# Patient Record
Sex: Female | Born: 1940 | Race: White | Hispanic: No | Smoking: Never smoker
Health system: Southern US, Community
[De-identification: ages and names within clinical notes are randomized; demographics above are authoritative.]

## PROBLEM LIST (undated history)

## (undated) DIAGNOSIS — E7800 Pure hypercholesterolemia, unspecified: Secondary | ICD-10-CM

## (undated) DIAGNOSIS — K219 Gastro-esophageal reflux disease without esophagitis: Secondary | ICD-10-CM

## (undated) DIAGNOSIS — M1990 Unspecified osteoarthritis, unspecified site: Secondary | ICD-10-CM

## (undated) DIAGNOSIS — C801 Malignant (primary) neoplasm, unspecified: Secondary | ICD-10-CM

## (undated) DIAGNOSIS — R7303 Prediabetes: Secondary | ICD-10-CM

## (undated) DIAGNOSIS — I10 Essential (primary) hypertension: Secondary | ICD-10-CM

## (undated) DIAGNOSIS — Z923 Personal history of irradiation: Secondary | ICD-10-CM

## (undated) DIAGNOSIS — M24662 Ankylosis, left knee: Secondary | ICD-10-CM

## (undated) DIAGNOSIS — F329 Major depressive disorder, single episode, unspecified: Secondary | ICD-10-CM

## (undated) DIAGNOSIS — C50919 Malignant neoplasm of unspecified site of unspecified female breast: Secondary | ICD-10-CM

## (undated) DIAGNOSIS — N649 Disorder of breast, unspecified: Secondary | ICD-10-CM

## (undated) DIAGNOSIS — F32A Depression, unspecified: Secondary | ICD-10-CM

## (undated) DIAGNOSIS — E039 Hypothyroidism, unspecified: Secondary | ICD-10-CM

## (undated) HISTORY — PX: OTHER SURGICAL HISTORY: SHX169

## (undated) HISTORY — DX: Disorder of breast, unspecified: N64.9

## (undated) HISTORY — PX: EYE SURGERY: SHX253

## (undated) HISTORY — DX: Pure hypercholesterolemia, unspecified: E78.00

## (undated) HISTORY — PX: COLONOSCOPY: SHX174

## (undated) HISTORY — PX: TUBAL LIGATION: SHX77

## (undated) HISTORY — PX: TONSILLECTOMY: SUR1361

---

## 1971-01-02 HISTORY — PX: BACK SURGERY: SHX140

## 1999-02-10 ENCOUNTER — Encounter: Admission: RE | Admit: 1999-02-10 | Discharge: 1999-05-11 | Payer: Self-pay | Admitting: Internal Medicine

## 2001-02-18 ENCOUNTER — Encounter: Payer: Self-pay | Admitting: Obstetrics and Gynecology

## 2001-02-18 ENCOUNTER — Ambulatory Visit (HOSPITAL_COMMUNITY): Admission: RE | Admit: 2001-02-18 | Discharge: 2001-02-18 | Payer: Self-pay | Admitting: Obstetrics and Gynecology

## 2002-02-27 ENCOUNTER — Encounter: Payer: Self-pay | Admitting: Obstetrics and Gynecology

## 2002-02-27 ENCOUNTER — Ambulatory Visit (HOSPITAL_COMMUNITY): Admission: RE | Admit: 2002-02-27 | Discharge: 2002-02-27 | Payer: Self-pay | Admitting: Obstetrics and Gynecology

## 2002-05-14 ENCOUNTER — Other Ambulatory Visit: Admission: RE | Admit: 2002-05-14 | Discharge: 2002-05-14 | Payer: Self-pay | Admitting: Unknown Physician Specialty

## 2003-05-04 ENCOUNTER — Ambulatory Visit (HOSPITAL_COMMUNITY): Admission: RE | Admit: 2003-05-04 | Discharge: 2003-05-04 | Payer: Self-pay | Admitting: Internal Medicine

## 2003-05-10 ENCOUNTER — Encounter: Admission: RE | Admit: 2003-05-10 | Discharge: 2003-05-10 | Payer: Self-pay | Admitting: Obstetrics and Gynecology

## 2003-06-16 ENCOUNTER — Encounter: Admission: RE | Admit: 2003-06-16 | Discharge: 2003-06-16 | Payer: Self-pay | Admitting: Orthopaedic Surgery

## 2003-07-09 ENCOUNTER — Emergency Department (HOSPITAL_COMMUNITY): Admission: EM | Admit: 2003-07-09 | Discharge: 2003-07-09 | Payer: Self-pay | Admitting: Emergency Medicine

## 2003-07-13 ENCOUNTER — Encounter (HOSPITAL_COMMUNITY): Admission: RE | Admit: 2003-07-13 | Discharge: 2003-08-12 | Payer: Self-pay | Admitting: Orthopaedic Surgery

## 2003-08-17 ENCOUNTER — Encounter (HOSPITAL_COMMUNITY): Admission: RE | Admit: 2003-08-17 | Discharge: 2003-09-16 | Payer: Self-pay | Admitting: Orthopaedic Surgery

## 2004-05-11 ENCOUNTER — Encounter: Admission: RE | Admit: 2004-05-11 | Discharge: 2004-05-11 | Payer: Self-pay | Admitting: Obstetrics and Gynecology

## 2005-06-15 ENCOUNTER — Ambulatory Visit (HOSPITAL_COMMUNITY): Admission: RE | Admit: 2005-06-15 | Discharge: 2005-06-15 | Payer: Self-pay | Admitting: Internal Medicine

## 2006-06-18 ENCOUNTER — Ambulatory Visit (HOSPITAL_COMMUNITY): Admission: RE | Admit: 2006-06-18 | Discharge: 2006-06-18 | Payer: Self-pay | Admitting: Obstetrics & Gynecology

## 2007-06-04 ENCOUNTER — Ambulatory Visit (HOSPITAL_COMMUNITY): Admission: RE | Admit: 2007-06-04 | Discharge: 2007-06-04 | Payer: Self-pay | Admitting: Internal Medicine

## 2007-06-20 ENCOUNTER — Ambulatory Visit (HOSPITAL_COMMUNITY): Admission: RE | Admit: 2007-06-20 | Discharge: 2007-06-20 | Payer: Self-pay | Admitting: Obstetrics & Gynecology

## 2007-07-10 ENCOUNTER — Ambulatory Visit (HOSPITAL_COMMUNITY): Admission: RE | Admit: 2007-07-10 | Discharge: 2007-07-10 | Payer: Self-pay | Admitting: Internal Medicine

## 2007-08-07 ENCOUNTER — Encounter: Admission: RE | Admit: 2007-08-07 | Discharge: 2007-08-07 | Payer: Self-pay | Admitting: Surgery

## 2008-06-21 ENCOUNTER — Ambulatory Visit (HOSPITAL_COMMUNITY): Admission: RE | Admit: 2008-06-21 | Discharge: 2008-06-21 | Payer: Self-pay | Admitting: Internal Medicine

## 2008-11-30 ENCOUNTER — Other Ambulatory Visit: Admission: RE | Admit: 2008-11-30 | Discharge: 2008-11-30 | Payer: Self-pay | Admitting: Obstetrics & Gynecology

## 2009-02-16 ENCOUNTER — Ambulatory Visit: Payer: Self-pay | Admitting: Orthopedic Surgery

## 2009-02-16 ENCOUNTER — Encounter (INDEPENDENT_AMBULATORY_CARE_PROVIDER_SITE_OTHER): Payer: Self-pay | Admitting: *Deleted

## 2009-02-16 DIAGNOSIS — M24519 Contracture, unspecified shoulder: Secondary | ICD-10-CM | POA: Insufficient documentation

## 2009-02-16 DIAGNOSIS — M25519 Pain in unspecified shoulder: Secondary | ICD-10-CM | POA: Insufficient documentation

## 2009-02-22 ENCOUNTER — Ambulatory Visit (HOSPITAL_COMMUNITY): Admission: RE | Admit: 2009-02-22 | Discharge: 2009-02-22 | Payer: Self-pay | Admitting: Orthopedic Surgery

## 2009-02-28 ENCOUNTER — Ambulatory Visit: Payer: Self-pay | Admitting: Orthopedic Surgery

## 2009-02-28 DIAGNOSIS — M19019 Primary osteoarthritis, unspecified shoulder: Secondary | ICD-10-CM | POA: Insufficient documentation

## 2009-03-01 ENCOUNTER — Ambulatory Visit: Payer: Self-pay | Admitting: Orthopedic Surgery

## 2009-03-01 ENCOUNTER — Ambulatory Visit (HOSPITAL_COMMUNITY): Admission: RE | Admit: 2009-03-01 | Discharge: 2009-03-01 | Payer: Self-pay | Admitting: Orthopedic Surgery

## 2009-03-02 ENCOUNTER — Telehealth (INDEPENDENT_AMBULATORY_CARE_PROVIDER_SITE_OTHER): Payer: Self-pay | Admitting: *Deleted

## 2009-03-03 ENCOUNTER — Ambulatory Visit: Payer: Self-pay | Admitting: Orthopedic Surgery

## 2009-03-04 ENCOUNTER — Encounter: Payer: Self-pay | Admitting: Orthopedic Surgery

## 2009-03-04 ENCOUNTER — Encounter (HOSPITAL_COMMUNITY): Admission: RE | Admit: 2009-03-04 | Discharge: 2009-04-03 | Payer: Self-pay | Admitting: Orthopedic Surgery

## 2009-03-30 ENCOUNTER — Encounter: Payer: Self-pay | Admitting: Orthopedic Surgery

## 2009-03-31 ENCOUNTER — Ambulatory Visit: Payer: Self-pay | Admitting: Orthopedic Surgery

## 2009-04-04 ENCOUNTER — Encounter (HOSPITAL_COMMUNITY): Admission: RE | Admit: 2009-04-04 | Discharge: 2009-05-04 | Payer: Self-pay | Admitting: Orthopedic Surgery

## 2009-04-27 ENCOUNTER — Encounter: Payer: Self-pay | Admitting: Orthopedic Surgery

## 2009-05-05 ENCOUNTER — Encounter (HOSPITAL_COMMUNITY): Admission: RE | Admit: 2009-05-05 | Discharge: 2009-06-04 | Payer: Self-pay | Admitting: Orthopedic Surgery

## 2009-05-25 ENCOUNTER — Encounter: Payer: Self-pay | Admitting: Orthopedic Surgery

## 2009-05-26 ENCOUNTER — Ambulatory Visit: Payer: Self-pay | Admitting: Orthopedic Surgery

## 2009-06-06 ENCOUNTER — Encounter (HOSPITAL_COMMUNITY): Admission: RE | Admit: 2009-06-06 | Discharge: 2009-07-06 | Payer: Self-pay | Admitting: Orthopedic Surgery

## 2009-06-23 ENCOUNTER — Ambulatory Visit (HOSPITAL_COMMUNITY): Admission: RE | Admit: 2009-06-23 | Discharge: 2009-06-23 | Payer: Self-pay | Admitting: Internal Medicine

## 2009-06-24 ENCOUNTER — Encounter: Payer: Self-pay | Admitting: Orthopedic Surgery

## 2009-07-06 ENCOUNTER — Ambulatory Visit: Payer: Self-pay | Admitting: Orthopedic Surgery

## 2010-01-25 ENCOUNTER — Ambulatory Visit
Admission: RE | Admit: 2010-01-25 | Discharge: 2010-01-25 | Payer: Self-pay | Source: Home / Self Care | Attending: Surgery | Admitting: Surgery

## 2010-01-25 LAB — POCT HEMOGLOBIN-HEMACUE: Hemoglobin: 15.3 g/dL — ABNORMAL HIGH (ref 12.0–15.0)

## 2010-01-27 NOTE — Op Note (Signed)
  NAME:  Breanna Hill, Breanna Hill              ACCOUNT NO.:  [id number]  MEDICAL RECORD NO.:  [id number]          PATIENT TYPE:  AMB  LOCATION:  DSC                          FACILITY:  [facility]  PHYSICIAN:  Thornton Park. Daphine Deutscher, MD  DATE OF BIRTH:  08/11/1940  DATE OF PROCEDURE:  01/25/2010 DATE OF DISCHARGE:                              OPERATIVE REPORT   PREOPERATIVE DIAGNOSIS:  Bleeding hemorrhoids.  POSTOPERATIVE DIAGNOSIS:  Bleeding hemorrhoids with some degree of prolapse.  PROCEDURE:  Exam under anesthesia, hemorrhoidal banding x2.  SURGEON:  Thornton Park. Daphine Deutscher, MD  ASSISTANT:  None.  ANESTHESIA:  General in the supine position.  FINDINGS: 1. At the 12 o'clock position, there was a large hemorrhoidal cluster     internally that had friable areas, it looked like it could be the     main source of bleeding.  These were double banded.  I felt     carefully at that position for possible rectocele, but there was     some weakness and attenuation, but not a frank rectocele.  There     was a degree of prolapse noted circumferentially and puckering, but     no significant external hemorrhoidal prolapse. 2. She had a hemorrhoid at the 5 o'clock position that was double     banded.  DESCRIPTION OF PROCEDURE:  This 70 year old lady was taken to room 3 [facility] on January 25, 2010.  She was placed in dorsal lithotomy position and her bottom was prepped with PCMX and draped sterilely.  I went ahead and did an exam under anesthesia and found at the 12 o'clock the above-mentioned area.  This was double banded.  I looked around the rest of the anorectal region, found the other area at about 5 o'clock, which was double banded.  No other significant internal columns could I found that I can band.  She had some little external areas and middle mild prolapse, there was more of puckering of her anorectal canal.  The area inside was prepped with Betadine as I completed this.  The patient  instructions were given to the husband and I gave her Percocet 5/325 #30 to take for pain and we will follow up in the office in about 3 weeks.     Thornton Park Daphine Deutscher, MD     MBM/MEDQ  D:  01/25/2010  T:  01/26/2010  Job:  [id number]  cc:   Kingsley Callander. Ouida Sills, MD  Electronically Signed by Luretha Murphy MD on 01/27/2010 08:30:50 PM

## 2010-01-31 NOTE — Assessment & Plan Note (Signed)
Summary: 8 WK RE-CK/POST OP RT SHOULDER/SURG 03/01/09/MEDICARE,BCBS/CAF   Visit Type:  Follow-up Referring Provider:  Dr. Ouida Sills Primary Provider:  Dr. Ouida Sills  CC:  recheck shoulder post op.  History of Present Illness:   DOS 03-01-09. Arthroscopy right shoulder, capsular release plus a manipulation under anesthesia, distal clavicle excision and subacromial decompression.  Medications: Robaxin, Norco sometimes not often / ibuprofen as needed   Today is recheck after PT.  Today is 8 week recheck after PT.  Progress note from 05/25/09 for review for PT and to be signed.  Still some stiffness and pain.  she does not want to take her pain medication she only takes ibuprofen  I've encouraged her to take her pain medication  I reviewed her physical therapy notes she's just about on schedule I see no problems with the therapy she is improving her range of motion.  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    Allergies: 1)  ! Pcn   Shoulder/Elbow Exam  General:    Well-developed, well-nourished, normal body habitus; no deformities, normal grooming.    Skin:    Intact, no scars, lesions, rashes, cafe au lait spots or bruising.    Inspection:    Inspection is normal.     her range of motion is active forward elevation 110, and therapy she gets about 125, the LEFT side is about 130.  External rotation is about 40 on the RIGHT and abduction is about 100.  She can put her hand on her head without moving her head.  So her functional range of motion is good at this point.   Impression & Recommendations:  Problem # 1:  AFTERCARE FOLLOW SURGERY MUSCULOSKEL SYSTEM NEC (ICD-V58.78) Assessment Improved  she is impatient and wants things to be better right now I tried to counsel her that she is on schedule, but this is typical for the problem that she had with the adhesive capsulitis.  I've encouraged her to use her pain medication so she can  tolerate the therapy  Orders: Post-Op Check ([address])  Patient Instructions: 1)  return in 6 weeks

## 2010-01-31 NOTE — Miscellaneous (Signed)
Summary: OT Clinical evaluation  OT Clinical evaluation   Imported By: Jacklynn Ganong 03/15/2009 11:47:22  _____________________________________________________________________  External Attachment:    Type:   Image     Comment:   External Document

## 2010-01-31 NOTE — Miscellaneous (Signed)
Summary: OT progress note  OT progress note   Imported By: Jacklynn Ganong 06/30/2009 09:34:39  _____________________________________________________________________  External Attachment:    Type:   Image     Comment:   External Document

## 2010-01-31 NOTE — Miscellaneous (Signed)
Summary: mri aph shoulder 02/22/09  Clinical Lists Changes  no precert needed for medicare but BCBS number is [id number] expires 30 days drom 02/16/09, aph mri rt shoulder on 02/22/09 reg at 1030am, apt to fu here in our office on 02/28/09 at 930am to bring disc.

## 2010-01-31 NOTE — Assessment & Plan Note (Signed)
Summary: 4 WK RE-CK/POST OP RT SHOULDER/SURG 03/01/09/MEDICARE,BC/CAF   Visit Type:  Follow-up Referring Provider:  Dr. Ouida Sills Primary Provider:  Dr. Ouida Sills  CC:  post op shoulder.  History of Present Illness:   DOS 03-01-09. Arthroscopy right shoulder, capsular release plus a manipulation under anesthesia, distal clavicle excision and subacromial decompression.  Medications: Robaxin, Norco 7.5.  POD 30  [week # 4]  Today is recheck after PT.  No problems.  I do not have a therapy report. I measured her to 90 of forward elevation and 35 of external rotation.  Recommend that she take her pain medicine, which he is reluctant to do and go ahead and progress further and therapy come back in 8 week's    Allergies: 1)  ! Pcn   Other Orders: Post-Op Check (04540)  Patient Instructions: 1)  Return in 8 weeks  2)  Continue Physical Therapy

## 2010-01-31 NOTE — Letter (Signed)
Summary: History form  History form   Imported By: Jacklynn Ganong 02/17/2009 07:59:43  _____________________________________________________________________  External Attachment:    Type:   Image     Comment:   External Document

## 2010-01-31 NOTE — Miscellaneous (Signed)
Summary: OT Progress note  OT Progress note   Imported By: Jacklynn Ganong 05/26/2009 14:06:19  _____________________________________________________________________  External Attachment:    Type:   Image     Comment:   External Document

## 2010-01-31 NOTE — Miscellaneous (Signed)
Summary: OT progress note  OT progress note   Imported By: Jacklynn Ganong 05/03/2009 11:59:14  _____________________________________________________________________  External Attachment:    Type:   Image     Comment:   External Document

## 2010-01-31 NOTE — Miscellaneous (Signed)
Summary: OT progress note  OT progress note   Imported By: Jacklynn Ganong 04/01/2009 08:08:54  _____________________________________________________________________  External Attachment:    Type:   Image     Comment:   External Document

## 2010-01-31 NOTE — Assessment & Plan Note (Signed)
Summary: RT SHOULDER PAIN NEEDS XR/ST BCBS/BSF   Vital Signs:  Patient profile:   70 year old female Weight:      162 pounds Pulse rate:   70 / minute Resp:     16 per minute  Vitals Entered By: Fuller Canada MD (February 16, 2009 8:36 AM)  Visit Type:  Initial Consult Referring Provider:  Dr. Ouida Sills Primary Provider:  Dr. Ouida Sills  CC:  right shoulder pain.  History of Present Illness: I saw Breanna Hill in the office today for an initial visit.  She is a 70 years old woman with the complaint of:  Right shoulder pain.  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  She is not sleeping well.  Her symptoms are intermittent and exacerbated by attempts at motion she feels a catching sensation.  She had 2 cortisone injections over the past 2 months and they helped for a short time.  She now comes in for consultation referenced RIGHT shoulder stiffness and pain  Xrays to be taken in our office today.  Meds: Gemfibrozil, Lipitor, Synthroid, Oxybutynin, Aspirin.     Allergies (verified): 1)  ! 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 Cardiac :  Denies chest pain, angina, heart attack, heart failure, poor circulation, blood clots, and phlebitis. Resp:  Denies short of breath, difficulty breathing, COPD, cough, and pneumonia. GI:  Denies nausea,  vomiting, diarrhea, constipation, difficulty swallowing, ulcers, GERD, and reflux; blood in stool. GU:  Denies kidney failure, kidney transplant, kidney stones, burning, poor stream, testicular cancer, blood in urine, and . Neuro:  Denies headache, dizziness, migraines, numbness, weakness, tremor, and unsteady walking. MS:  Complains of joint pain; denies rheumatoid arthritis, joint swelling, gout, bone cancer, osteoporosis, and ; muscle pain. Endo:  Denies thyroid disease, goiter, and diabetes. Psych:  Denies depression, mood swings, anxiety, panic attack, bipolar, and schizophrenia. Derm:  Denies eczema, cancer, and itching. EENT:  Denies poor vision, cataracts, glaucoma, poor hearing, vertigo, ears ringing, sinusitis, hoarseness, toothaches, and bleeding gums. Immunology:  Denies seasonal allergies, sinus problems, and allergic to bee stings. Lymphatic:  Denies lymph node cancer and lymph edema.  Physical Exam  Msk:  normal development grooming and hygiene  Vital signs as recorded stable. Pulses:  cardiovascular exam shows normal peripheral vascular system by observation and palpation of the upper extremities Extremities:  her neck is mildly tender range of motion has been preserved there is some tightness in the RIGHT side and some tenderness in the RIGHT supraspinatus fossa  The LEFT shoulder has full range of motion, normal strength.  No joint subluxation or laxity.  No tenderness. Neurologic:  reflexes are 2+ and equal in both upper extremities sensation was intact and normal.  Coordination was normal as well. Skin:  cervical spine, upper trunk in both upper extremities had normal skin Cervical Nodes:  no significant adenopathy Axillary Nodes:  no  significant adenopathy Psych:  alert and cooperative; normal mood and affect; normal attention span and concentration   Shoulder/Elbow Exam  Shoulder Exam:    Right:    Inspection:  Abnormal    Palpation:  Abnormal    Stability:   stable    Tenderness:  supraspinatus fossa    Swelling:  none    Erythema:  negative    active range of motion forward elevation = 100 passively the same  Abduction equals 75 passive the same  External rotation equals 40 passive the same    Left:    Inspection:  Normal    Palpation:  Normal    Stability:  stable  Forearm Exam:    Right:    Inspection:  Normal    Palpation:  Normal    Stability:  stable    Tenderness:  no    Swelling:  no    Erythema:  no    Left:    Inspection:  Normal    Palpation:  Normal    Stability:  stable    Tenderness:  no    Swelling:  no    Erythema:  no  Elbow Exam:    Right:    Inspection:  Normal    Palpation:  Normal    Stability:  stable    Tenderness:  no    Swelling:  no    Erythema:  no    Range of Motion:       Flexion-Active: 135       Extension-Active: 0       Flexion-Passive: 135       Extension-Passive: 0       Elbow Flexion:      Left:    Inspection:  Normal    Palpation:  Normal    Stability:  stable    Tenderness:  no    Swelling:  no    Erythema:  no    Range of Motion:       Flexion-Active: 135       Extension-Active: 0       Flexion-Passive: 135       Extension-Passive: 0       Elbow Flexion:    Impingement Sign NEER:    Left negative Impingement Sign HAWKINS:    Left negative Apprehension Sign:    Left negative   Impression & Recommendations:  Problem # 1:  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  Normal contour and shins line of the shoulder.  Type II acromion.  No bony abnormality.  Impression, normal RIGHT shoulder  Patient Instructions: 1)  MRI right shoulder come back with results

## 2010-01-31 NOTE — Assessment & Plan Note (Signed)
Summary: POST OP 1/RT SHOULDER/MEDICARE,BCBS/CAF   Visit Type:  post op #1 Referring Provider:  Dr. Ouida Sills Primary Provider:  Dr. Ouida Sills  CC:  post op right shoulder.  History of Present Illness: I saw Breanna Hill in the office today for a followup visit.  She is a 70 years old woman with the complaint of:  post op day 2   DOS 03-01-09. Arthroscopy right shoulder, capsular release plus a manipulation under anesthesia, distal clavicle excision and subacromial decompression.  Medications: Robaxin, Norco 7.5.  Allergies: 1)  ! Pcn   Shoulder/Elbow Exam  General:    Well-developed, well-nourished, normal body habitus; no deformities, normal grooming.    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

## 2010-01-31 NOTE — Assessment & Plan Note (Signed)
Summary: 6 WK RE-CK/POST OP RT SHOULDER/SURG 03/01/09/MEDICARE,BCBS/CAF   Visit Type:  post op Referring Provider:  Dr. Ouida Sills Primary Provider:  Dr. Ouida Sills  CC:  right shoulder .  History of Present Illness: I saw Breanna Hill in the office today for a 6 week  followup visit.  She is a 70 years old woman with the complaint of:  right shoulder  DOS 03-01-09. Arthroscopy right shoulder, capsular release plus a manipulation under anesthesia, distal clavicle excision and subacromial decompression.  Medications: none.  Today 4 month recheck:   She says the only thing she can do his hook her bra.  She can do all activities of daily living and she's not having any pain.  She has equal flexion of the RIGHT shoulder compared to the LEFT except for the last 10 she has excellent external rotation her internal rotation is to her hip pocket  I think she should be fine with a home exercise program follow up as needed  Allergies: 1)  ! Pcn   Impression & Recommendations:  Problem # 1:  AFTERCARE FOLLOW SURGERY MUSCULOSKEL SYSTEM NEC (ICD-V58.78) Assessment Improved  Orders: Est. 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 Patient Level II (60454)  Patient Instructions: 1)  Home exercises  2)  f/u as needed

## 2010-01-31 NOTE — Progress Notes (Signed)
Summary: cryocuff is causing discomfort  Phone Note Call from Patient Call back at Home Phone [phone]   Summary of Call: said that the shoulder cryocuff if very uncomfortable for her breast, I advised she could take it off to get comfortable or apply cushioning if needed, advised not to move arm, shoulder post op to keep her arm as close to her body as possible. Initial call taken by: Ether Griffins,  March 02, 2009 9:10 AM

## 2010-01-31 NOTE — Assessment & Plan Note (Signed)
Summary: mri results shoulder aph to bring disc/[email or web address]   Visit Type:  Follow-up Referring Provider:  Dr. Ouida Sills Primary Provider:  Dr. Ouida Sills  CC:  right shoulder pain.  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  She is not sleeping well.  Her symptoms are intermittent and exacerbated by attempts at motion she feels a catching sensation.  She had 2 cortisone injections over the past 2 months and they helped for a short time. she has not made significant improvements so we went for an MRI.   MRI results.  IMPRESSION:   1.  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 2.  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 3.  Downsloping, type 1 acromion.  Subacromial/subdeltoid bursitis noted.   Read By:  Charyl Dancer,  M.D.      Meds: Gemfibrozil, Lipitor, Synthroid, Oxybutynin, Aspirin.     Allergies: 1)  ! Pcn  Physical Exam  Additional Exam:  Msk:  normal development grooming and hygiene  Vital signs as recorded stable. Pulses:  cardiovascular exam shows normal peripheral vascular system by observation and palpation of the upper extremities Extremities:  her neck is mildly tender range of motion has been preserved there is some tightness in the RIGHT side and some tenderness in the RIGHT supraspinatus fossa  The LEFT shoulder has full range of motion, normal strength.  No joint subluxation or laxity.  No tenderness. Neurologic:  reflexes are 2+ and equal in both upper extremities sensation was intact and normal.  Coordination was normal as well. Skin:  cervical spine, upper trunk in both upper extremities had normal  skin Cervical Nodes:  no significant adenopathy Axillary Nodes:  no significant adenopathy Psych:  alert and cooperative; normal mood and affect; normal attention span and concentration   Shoulder/Elbow Exam  Shoulder Exam:    Right:    Inspection:  Abnormal    Palpation:  Abnormal    Stability:  stable    Tenderness:  supraspinatus fossa    Swelling:  none    Erythema:  negative    active range of motion forward elevation = 100 passively the same  Abduction equals 75 passive the same  External rotation equals 40 passive the same    Left:    Inspection:  Normal    Palpation:  Normal    Stability:  stable      Impression & Recommendations:  Problem # 1:  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 I have answered their questions. The risks of bleeding, infection, nerve and vascualr injury have been discussed. The diagnosis and reason for surgery have been explained. The patient demonstrates understanding of this discussion. Specific to this procedure risks include:  pain, stiffness weakness   Orders: Est. 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 Patient Level IV (60454)  Problem # 3:  SHOULDER PAIN (UJW-119.14)  Patient Instructions: 1)  DOS 03/01/09 at [facility] 2)  Preop at [facility] today, I will call and let you know what time to go, 02/28/09  at 130pm 3)  Post op 1 in our office on Thursday 03/03/09

## 2010-01-31 NOTE — Letter (Signed)
Summary: surgery order RTshoulder sched 03/01/09  surgery order RTshoulder sched 03/01/09   Imported By: Cammie Sickle 04/05/2009 11:28:01  _____________________________________________________________________  External Attachment:    Type:   Image     Comment:   External Document

## 2010-01-31 NOTE — Letter (Signed)
Summary: *Orthopedic Consult Note  [facility]  [address] [place] [address]  [place], [place] [address]   Phone: [phone]  Fax: [phone]    Re:    AILINE HEFFERAN DOB:    [birth year]   Dear: Dr. Ouida Sills   Thank you for requesting that we see the above patient for consultation.  A copy of the detailed office note will be sent under separate cover, for your review.  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       Thank you for this opportunity to look after your patient.  Sincerely,   Terrance Mass. MD.

## 2010-03-22 LAB — BASIC METABOLIC PANEL
BUN: 18 mg/dL (ref 6–23)
CO2: 29 mEq/L (ref 19–32)
Calcium: 9 mg/dL (ref 8.4–10.5)
GFR calc Af Amer: >60 mL/min (ref >60)
Glucose, Bld: 122 mg/dL — ABNORMAL HIGH (ref 70–99)
Potassium: 3.6 mEq/L (ref 3.5–5.1)
Sodium: 138 mEq/L (ref 135–145)

## 2010-03-22 LAB — HEMOGLOBIN AND HEMATOCRIT, BLOOD: Hemoglobin: 13.6 g/dL (ref 12.0–15.0)

## 2010-05-19 NOTE — Consult Note (Signed)
NAME:  Breanna Hill, Breanna Hill                          ACCOUNT NO.:  [id number]   MEDICAL RECORD NO.:  [id number]                  PATIENT TYPE:   LOCATION:                                       FACILITY:   PHYSICIAN:  Lionel December, M.D.                 DATE OF BIRTH:  05-21-1940   DATE OF CONSULTATION:  DATE OF DISCHARGE:                                   CONSULTATION   REFERRING PHYSICIAN:  Dr. Ouida Sills.   REASON FOR CONSULTATION:  Intermittent hematochezia, requesting screening  colonoscopy.   HISTORY OF PRESENT ILLNESS:  Breanna Hill is a 70 year old healthy Caucasian  female who presents to our office with a long standing history of chronic  intermittent small volume fresh rectal bleeding noted on toilet paper in the  commode. She does have history of some fissures as well as multiple  hemorrhoids. She can remember history of having bowel trouble even a  child. She reports that her rectum was dilated at approximately 70 years of  age. She also reports flexible sigmoidoscopy approximately 10 years ago  which was normal. She has never had screening colonoscopy. She does use  occasional over-the-counter hemorrhoidal remedies with good but intermittent  relief. She complains of intermittent proctalgia. She denies any  constipation or hard stools. She does report occasional loose stool with  bowel movements between one and three times a day. She also reports slight  fecal incontinence as well as urgency and tenesmus. She denies any abdominal  pain or melena. She does report occasional abdominal bloating. Family  history positive for multiple cancers on the maternal side. There are 12  siblings, and 10 out of 12 have multiple different types of cancer. She  believes maternal grandfather had history of colon cancer diagnosed in his  late 38s and a maternal aunt with some type of questionable colon cancer  versus gastric carcinoma.   PAST MEDICAL HISTORY:  1. Hyperlipidemia.  2.  Arthritis.  3. Hypothyroidism.   PAST SURGICAL HISTORY:  1. Disk surgery on her back multiple years ago.  2. Right foot surgery for bunionectomy.  3. Rectal dilatation as a child, details unknown.   CURRENT MEDICATIONS:  1. Gemfibrozil 600 mg one tablet b.i.d.  2. Lipitor 20 mg daily.  3. Synthroid 100 mcg daily.  4. Aspirin 81 mg daily.  5. Tylenol arthritis p.r.n.   ALLERGIES:  PENICILLIN.   FAMILY HISTORY:  As stated in the HPI, there are multiple maternal cancers  of different etiologies, ten out of 12 siblings, with questionable colon  carcinoma in grandfather diagnosed in his 1s. Questionable maternal aunt  with colon carcinoma versus gastric. Mother deceased at age 79 secondary to  coronary artery disease as well as father in his 59s. She has one brother  with hyperlipidemia.   SOCIAL HISTORY:  Breanna Hill has been married for 42 years. She had two  grown healthy children; one son is  in remission from leukemia status post  bone marrow transplantation at age 75. She is currently retired since  September 2004. Tobacco:  Denies any use. Alcohol:  Reports occasional  social drink up to four times a week. Drugs:  Denies any use.   REVIEW OF SYSTEMS:  CONSTITUTIONAL:  Weight is steadily __________ although  she attributes this as due to _____________ physical activity. She denies  any fevers, chills. Appetite is good. Denies any fatigue. CARDIOVASCULAR:  Denies any chest pain or palpitations. PULMONARY:  Denies any shortness of  breath, dyspnea, cough, or hemoptysis. GASTROINTESTINAL:  See HPI. Denies  any history of chronic GERD, peptic ulcer disease, dysphagia, or  odynophagia.   PHYSICAL EXAMINATION:  VITAL SIGNS:  Weight 174 pounds, height 63.5 inches,  temperature 98.1, blood pressure 120/80, pulse 76.  GENERAL:  Breanna Hill is a 70 year old well-developed, well-nourished  Caucasian female in no acute distress. She appears younger than her stated  age.  HEENT:   Sclerae clear, nonicteric. Conjunctivae are pink. Oropharynx pink  and moist without any lesions.  NECK:  Supple without any thyromegaly.  CHEST:  Heart regular rate and rhythm with normal S1 and S2 without any  murmurs, rubs, or gallops. Lungs clear to auscultation bilaterally.  ABDOMEN:  Protuberant with positive bowel sounds x4. No audible bruits.  Soft, nontender, nondistended with palpable mass or hepatosplenomegaly. No  rebound tenderness or guarding.  RECTAL:  Deferred.  EXTREMITIES:  Good pulses bilaterally. Trace edema bilaterally.  SKIN:  Warm and dry without any rashes or jaundice.   ASSESSMENT:  Breanna Hill is a 70 year old Caucasian female with  intermittent small volume hematochezia for long standing period of time. She  does not display any red flags as far as colorectal carcinoma including  weight loss, malnourishment, or any other constitutional symptoms. She does  note a long standing history of problems with possible tenesmus, urgency,  and possible fecal incontinence as well. Further evaluation is necessary at  this point in time.   RECOMMENDATIONS:  1. Will schedule colonoscopy with Dr. Karilyn Cota in the near future. I have     discussed the procedure including risks and benefits which include but     are not limited to bleeding, perforation, infection, drug reaction. She     agrees with this plan and consult will be obtained. I asked her to hold     her aspirin three days prior to the procedure.  2. Standard hemorrhoid literature and precautions.  3. Further recommendations pending procedure.   We would like to thank Dr. Ouida Sills for allowing [place] to participate in the care  of Breanna Hill.     ________________________________________  ___________________________________________  Nicholas Lose, N.P.                  Lionel December, M.D.   KC/MEDQ  D:  04/27/2003  T:  04/27/2003  Job:  [id number]   cc:   Kingsley Callander. Ouida Sills, M.D. [address]  [place]   [place] [address]  Fax: [phone]

## 2010-05-19 NOTE — Op Note (Signed)
NAME:  Breanna Hill, Breanna Hill                        ACCOUNT NO.:  [id number]   MEDICAL RECORD NO.:  [id number]                   PATIENT TYPE:  AMB   LOCATION:  DAY                                  FACILITY:  [facility]   PHYSICIAN:  Lionel December, M.D.                 DATE OF BIRTH:  1940-12-12   DATE OF PROCEDURE:  05/04/2003  DATE OF DISCHARGE:                                 OPERATIVE REPORT   PROCEDURE:  Total colonoscopy.   INDICATIONS:  Breanna Hill is a 70 year old Caucasian female who was here  for screening colonoscopy. She has intermittent hematochezia, possibly  related to hemorrhoids. Family history is negative for colorectal carcinoma.  Procedure was reviewed with the patient and informed consent was obtained.   PREOPERATIVE MEDICATIONS:  Demerol 25 mg IV, Versed 4 mg IV.   FINDINGS:  Procedure performed in endoscopy suite. The patient's vital signs  and O2 saturations were monitored during procedure and remained stable. The  patient was placed in the left lateral position and rectal examination  performed. No abnormality. She has soft sentinel skin tags. Digital exam was  normal. Scope was placed in the rectum and advanced into sigmoid colon and  beyond. Preparation was excellent. Scope was passed to the cecum which was  identified by appendiceal orifice. Picture taken for the record. As the  scope was withdrawn, colonic mucosa was once again carefully examined. There  were two small polyps at ascending colon. One was about 5 mm, other one a  little smaller. These appeared to be edematous, and these were snared free  for histological examination. Pictures taken for the record. As the scope  was withdrawn, the rest of the colonic mucosa was carefully examined and was  normal. Perirectal mucosa similarly was normal. The scope was retroflexed,  examined the inner rectum, and small hemorrhoids were noted below the  dentate line.  The patient tolerated the procedure well.   FINAL  DIAGNOSES:  1. Two small polyps snared from ascending colon.  2. Small external hemorrhoids felt to be source of patient's intermittent     hematochezia.   RECOMMENDATIONS:  Standard instructions given. I will be contacting patient  with biopsy results for further recommendations.      ___________________________________________                                            Lionel December, M.D.   NR/MEDQ  D:  05/04/2003  T:  05/04/2003  Job:  [id number]   cc:   Kingsley Callander. Ouida Sills, M.D.  [address]  [place]  [place] [address]  Fax: [phone]

## 2010-05-24 ENCOUNTER — Other Ambulatory Visit (HOSPITAL_COMMUNITY): Payer: Self-pay | Admitting: Internal Medicine

## 2010-05-24 DIAGNOSIS — Z139 Encounter for screening, unspecified: Secondary | ICD-10-CM

## 2010-06-26 ENCOUNTER — Ambulatory Visit (HOSPITAL_COMMUNITY)
Admission: RE | Admit: 2010-06-26 | Discharge: 2010-06-26 | Disposition: A | Discharge status: Home / Self Care | Payer: Medicare Other | Source: Ambulatory Visit | Attending: Internal Medicine | Admitting: Internal Medicine

## 2010-06-26 DIAGNOSIS — Z1231 Encounter for screening mammogram for malignant neoplasm of breast: Secondary | ICD-10-CM | POA: Insufficient documentation

## 2010-06-26 DIAGNOSIS — Z139 Encounter for screening, unspecified: Secondary | ICD-10-CM

## 2011-06-28 ENCOUNTER — Other Ambulatory Visit: Payer: Self-pay | Admitting: Internal Medicine

## 2011-06-28 DIAGNOSIS — Z1231 Encounter for screening mammogram for malignant neoplasm of breast: Secondary | ICD-10-CM

## 2011-07-25 ENCOUNTER — Ambulatory Visit
Admission: RE | Admit: 2011-07-25 | Discharge: 2011-07-25 | Disposition: A | Discharge status: Home / Self Care | Payer: Medicare Other | Source: Ambulatory Visit | Attending: Internal Medicine | Admitting: Internal Medicine

## 2011-07-25 DIAGNOSIS — Z1231 Encounter for screening mammogram for malignant neoplasm of breast: Secondary | ICD-10-CM

## 2011-10-02 ENCOUNTER — Ambulatory Visit (INDEPENDENT_AMBULATORY_CARE_PROVIDER_SITE_OTHER): Payer: Medicare Other | Admitting: Urology

## 2011-10-02 DIAGNOSIS — R351 Nocturia: Secondary | ICD-10-CM

## 2011-10-02 DIAGNOSIS — R3915 Urgency of urination: Secondary | ICD-10-CM

## 2011-10-02 DIAGNOSIS — N3946 Mixed incontinence: Secondary | ICD-10-CM

## 2011-10-02 DIAGNOSIS — R32 Unspecified urinary incontinence: Secondary | ICD-10-CM

## 2011-12-04 ENCOUNTER — Ambulatory Visit (INDEPENDENT_AMBULATORY_CARE_PROVIDER_SITE_OTHER): Payer: Medicare Other | Admitting: Urology

## 2011-12-04 DIAGNOSIS — R3915 Urgency of urination: Secondary | ICD-10-CM

## 2011-12-04 DIAGNOSIS — N3946 Mixed incontinence: Secondary | ICD-10-CM

## 2012-06-16 ENCOUNTER — Other Ambulatory Visit: Payer: Self-pay | Admitting: *Deleted

## 2012-06-16 DIAGNOSIS — N3281 Overactive bladder: Secondary | ICD-10-CM

## 2012-06-16 MED ORDER — OXYBUTYNIN CHLORIDE ER 15 MG PO TB24: 15.0000 mg | ORAL_TABLET | Freq: Every day | ORAL | Status: DC

## 2012-07-16 ENCOUNTER — Other Ambulatory Visit: Payer: Self-pay

## 2012-07-16 DIAGNOSIS — Z1231 Encounter for screening mammogram for malignant neoplasm of breast: Secondary | ICD-10-CM

## 2012-08-06 ENCOUNTER — Ambulatory Visit
Admission: RE | Admit: 2012-08-06 | Discharge: 2012-08-06 | Disposition: A | Discharge status: Home / Self Care | Payer: Medicare Other | Source: Ambulatory Visit

## 2012-08-06 DIAGNOSIS — Z1231 Encounter for screening mammogram for malignant neoplasm of breast: Secondary | ICD-10-CM

## 2012-08-28 ENCOUNTER — Other Ambulatory Visit: Payer: Self-pay | Admitting: *Deleted

## 2012-08-28 DIAGNOSIS — N3281 Overactive bladder: Secondary | ICD-10-CM

## 2012-08-29 MED ORDER — OXYBUTYNIN CHLORIDE ER 15 MG PO TB24: 15.0000 mg | ORAL_TABLET | Freq: Every day | ORAL | Status: DC

## 2013-08-18 ENCOUNTER — Ambulatory Visit (INDEPENDENT_AMBULATORY_CARE_PROVIDER_SITE_OTHER): Payer: Medicare Other | Admitting: Urology

## 2013-08-18 DIAGNOSIS — R350 Frequency of micturition: Secondary | ICD-10-CM

## 2013-08-18 DIAGNOSIS — N3946 Mixed incontinence: Secondary | ICD-10-CM

## 2013-08-28 ENCOUNTER — Other Ambulatory Visit: Payer: Self-pay

## 2013-08-28 ENCOUNTER — Other Ambulatory Visit (HOSPITAL_COMMUNITY): Payer: Self-pay | Admitting: Internal Medicine

## 2013-08-28 DIAGNOSIS — Z1231 Encounter for screening mammogram for malignant neoplasm of breast: Secondary | ICD-10-CM

## 2013-09-29 ENCOUNTER — Ambulatory Visit
Admission: RE | Admit: 2013-09-29 | Discharge: 2013-09-29 | Disposition: A | Discharge status: Home / Self Care | Payer: Medicare Other | Source: Ambulatory Visit

## 2013-09-29 DIAGNOSIS — Z1231 Encounter for screening mammogram for malignant neoplasm of breast: Secondary | ICD-10-CM

## 2014-03-01 ENCOUNTER — Emergency Department (HOSPITAL_COMMUNITY)
Admission: EM | Admit: 2014-03-01 | Discharge: 2014-03-01 | Disposition: A | Discharge status: Home / Self Care | Payer: Medicare Other | Attending: Emergency Medicine | Admitting: Emergency Medicine

## 2014-03-01 ENCOUNTER — Encounter (HOSPITAL_COMMUNITY): Payer: Self-pay | Admitting: Cardiology

## 2014-03-01 ENCOUNTER — Emergency Department (HOSPITAL_COMMUNITY): Payer: Medicare Other

## 2014-03-01 DIAGNOSIS — K219 Gastro-esophageal reflux disease without esophagitis: Secondary | ICD-10-CM | POA: Insufficient documentation

## 2014-03-01 DIAGNOSIS — Z79899 Other long term (current) drug therapy: Secondary | ICD-10-CM | POA: Insufficient documentation

## 2014-03-01 DIAGNOSIS — Z880 Allergy status to penicillin: Secondary | ICD-10-CM | POA: Diagnosis not present

## 2014-03-01 DIAGNOSIS — R079 Chest pain, unspecified: Secondary | ICD-10-CM | POA: Diagnosis present

## 2014-03-01 LAB — COMPREHENSIVE METABOLIC PANEL
ALBUMIN: 3.8 g/dL (ref 3.5–5.2)
ALK PHOS: 80 U/L (ref 39–117)
ALT: 27 U/L (ref 0–35)
ANION GAP: 6 (ref 5–15)
AST: 25 U/L (ref 0–37)
BILIRUBIN TOTAL: 0.3 mg/dL (ref 0.3–1.2)
BUN: 23 mg/dL (ref 6–23)
CHLORIDE: 105 mmol/L (ref 96–112)
CO2: 27 mmol/L (ref 19–32)
Calcium: 9.1 mg/dL (ref 8.4–10.5)
Creatinine, Ser: 0.8 mg/dL (ref 0.50–1.10)
GFR calc Af Amer: 82 mL/min — ABNORMAL LOW (ref >90)
GFR calc non Af Amer: 70 mL/min — ABNORMAL LOW (ref >90)
Glucose, Bld: 152 mg/dL — ABNORMAL HIGH (ref 70–99)
POTASSIUM: 3.7 mmol/L (ref 3.5–5.1)
SODIUM: 138 mmol/L (ref 135–145)
TOTAL PROTEIN: 6.9 g/dL (ref 6.0–8.3)

## 2014-03-01 LAB — CBC WITH DIFFERENTIAL/PLATELET
BASOS ABS: 0.1 10*3/uL (ref 0.0–0.1)
BASOS PCT: 1 % (ref 0–1)
EOS ABS: 0.2 10*3/uL (ref 0.0–0.7)
EOS PCT: 3 % (ref 0–5)
HCT: 36 % (ref 36.0–46.0)
Hemoglobin: 11.7 g/dL — ABNORMAL LOW (ref 12.0–15.0)
LYMPHS ABS: 1.7 10*3/uL (ref 0.7–4.0)
Lymphocytes Relative: 25 % (ref 12–46)
MCH: 28.3 pg (ref 26.0–34.0)
MCHC: 32.5 g/dL (ref 30.0–36.0)
MCV: 87.2 fL (ref 78.0–100.0)
Monocytes Absolute: 0.5 10*3/uL (ref 0.1–1.0)
Monocytes Relative: 7 % (ref 3–12)
NEUTROS PCT: 64 % (ref 43–77)
Neutro Abs: 4.4 10*3/uL (ref 1.7–7.7)
PLATELETS: 268 10*3/uL (ref 150–400)
RBC: 4.13 MIL/uL (ref 3.87–5.11)
RDW: 13.1 % (ref 11.5–15.5)
WBC: 6.9 10*3/uL (ref 4.0–10.5)

## 2014-03-01 LAB — TROPONIN I: Troponin I: <0.03 ng/mL (ref <0.031)

## 2014-03-01 MED ORDER — GI COCKTAIL ~~LOC~~
30.0000 mL | Freq: Once | ORAL | Status: AC
  Administered 2014-03-01: 30 mL via ORAL
  Filled 2014-03-01: qty 30

## 2014-03-01 NOTE — Discharge Instructions (Signed)
Follow up with your md later this week. °

## 2014-03-01 NOTE — ED Notes (Signed)
Chest pain since last night.  C/o left upper arm pain.  States she feels like her pills got stuck in her throat last night.  Describes pain as an indigestion.

## 2014-03-01 NOTE — ED Provider Notes (Signed)
CSN: [id number]     Arrival date & time 03/01/14  3762 History  This chart was scribed for Maudry Diego, MD by Stephania Fragmin, ED Scribe. This patient was seen in room APA02/APA02 and the patient's care was started at 9:35 AM.   Chief Complaint  Patient presents with  . Chest Pain   Patient is a 74 y.o. female presenting with chest pain. The history is provided by the patient. No language interpreter was used.  Chest Pain Pain location:  Epigastric Pain quality comment:  Feels like indigestion Pain radiates to:  L arm Pain severity:  No pain (she describes that there is no pain, just discomfort) Onset quality:  Sudden Timing:  Constant Progression:  Unchanged Chronicity:  New Context comment:  Swallowing stool softener pills Relieved by:  Nothing Worsened by:  Nothing tried Ineffective treatments: drinking water. Associated symptoms: no abdominal pain, no back pain, no cough, no diaphoresis, no fatigue, no headache and no shortness of breath      HPI Comments: Breanna Hill is a 74 y.o. female who presents to the Emergency Department complaining of chest pain that feels like indigestion, with onset 9 hours ago. Patient states she took 4 stool softeners about 9 hours ago before bed, but they didn't seem to go down. She was unable to sleep due to the feeling of indigestion, so she drank water to try to make them go down, which didn't work. This morning, she still has indigestion and discomfort. Patient states that she has not eaten anything today. She has never seen a GI doctor. She denies trouble swallowing water, diaphoresis, or SOB.   History reviewed. No pertinent past medical history. Past Surgical History  Procedure Laterality Date  . Back surgery     History reviewed. No pertinent family history. History  Substance Use Topics  . Smoking status: Never Smoker   . Smokeless tobacco: Not on file  . Alcohol Use: Yes     Comment: occasional   OB History    No data available      Review of Systems  Constitutional: Negative for diaphoresis, appetite change and fatigue.  HENT: Negative for congestion, ear discharge and sinus pressure.   Eyes: Negative for discharge.  Respiratory: Negative for cough and shortness of breath.   Cardiovascular: Positive for chest pain.  Gastrointestinal: Negative for abdominal pain and diarrhea.  Genitourinary: Negative for frequency and hematuria.  Musculoskeletal: Negative for back pain.  Skin: Negative for rash.  Neurological: Negative for seizures and headaches.  Psychiatric/Behavioral: Negative for hallucinations.      Allergies  Penicillins  Home Medications   Prior to Admission medications   Medication Sig Start Date End Date Taking? Authorizing Provider  ATORVASTATIN CALCIUM PO Take by mouth.   Yes Historical Provider, MD  docusate sodium (COLACE) 100 MG capsule Take 400 mg by mouth daily.   Yes Historical Provider, MD  Levothyroxine Sodium (LEVOTHROID PO) Take by mouth.   Yes Historical Provider, MD  oxybutynin (DITROPAN XL) 15 MG 24 hr tablet Take 1 tablet (15 mg total) by mouth at bedtime. 08/28/12   Florian Buff, MD   BP 174/75 mmHg  Pulse 64  Temp(Src) 98 F (36.7 C) (Oral)  Resp 16  Ht 5\' 3"  (1.6 m)  Wt 162 lb (73.483 kg)  BMI 28.70 kg/m2  SpO2 99% Physical Exam  Constitutional: She is oriented to person, place, and time. She appears well-developed.  HENT:  Head: Normocephalic.  Eyes: Conjunctivae and EOM are  normal. No scleral icterus.  Neck: Neck supple. No thyromegaly present.  Cardiovascular: Normal rate and regular rhythm.  Exam reveals no gallop and no friction rub.   No murmur heard. Pulmonary/Chest: No stridor. She has no wheezes. She has no rales. She exhibits no tenderness.  Abdominal: She exhibits no distension. There is tenderness. There is no rebound.  Mild epigastric tenderness.  Musculoskeletal: Normal range of motion. She exhibits no edema.  Lymphadenopathy:    She has no  cervical adenopathy.  Neurological: She is oriented to person, place, and time. She exhibits normal muscle tone. Coordination normal.  Skin: No rash noted. No erythema.  Psychiatric: She has a normal mood and affect. Her behavior is normal.  Nursing note and vitals reviewed.   ED Course  Procedures (including critical care time)  DIAGNOSTIC STUDIES: Oxygen Saturation is 99% on room air, normal by my interpretation.    COORDINATION OF CARE: 9:36 AM - Discussed treatment plan with pt at bedside which includes administering an oral medication, and pt agreed to plan.   Medications  gi cocktail (Maalox,Lidocaine,Donnatal) (30 mLs Oral Given 03/01/14 0949)    Labs Review Labs Reviewed  CBC WITH DIFFERENTIAL/PLATELET - Abnormal; Notable for the following:    Hemoglobin 11.7 (*)    All other components within normal limits  COMPREHENSIVE METABOLIC PANEL - Abnormal; Notable for the following:    Glucose, Bld 152 (*)    GFR calc non Af Amer 70 (*)    GFR calc Af Amer 82 (*)    All other components within normal limits  TROPONIN I    Imaging Review Dg Chest 2 View  03/01/2014   CLINICAL DATA:  Chest pain.  EXAM: CHEST  2 VIEW  COMPARISON:  07/09/2003  FINDINGS: Borderline heart size. Pulmonary vascularity is normal. No infiltrates or effusions. No acute osseous abnormality.  IMPRESSION: No acute disease.   Electronically Signed   By: Lorriane Shire M.D.   On: 03/01/2014 10:30     EKG Interpretation   Date/Time:  Monday March 01 2014 08:55:00 EST Ventricular Rate:  60 PR Interval:  186 QRS Duration: 111 QT Interval:  418 QTC Calculation: 418 R Axis:   -39 Text Interpretation:  Sinus rhythm Left ventricular hypertrophy Anterior Q  waves, possibly due to LVH Confirmed by Terel Bann  MD, Ilena Dieckman ([id number]) on  03/01/2014 9:19:52 AM      MDM   Final diagnoses:  None   Pt improved with gi cocktail,  Eating well,   She will follow up with her pcp.  Will hold off ppi unless continued  symptoms   The chart was scribed for me under my direct supervision.  I personally performed the history, physical, and medical decision making and all procedures in the evaluation of this patient.Maudry Diego, MD 03/01/14 [phone]

## 2014-08-30 ENCOUNTER — Other Ambulatory Visit (HOSPITAL_COMMUNITY): Payer: Self-pay | Admitting: Internal Medicine

## 2014-08-30 DIAGNOSIS — Z1231 Encounter for screening mammogram for malignant neoplasm of breast: Secondary | ICD-10-CM

## 2014-10-04 ENCOUNTER — Ambulatory Visit (HOSPITAL_COMMUNITY)
Admission: RE | Admit: 2014-10-04 | Discharge: 2014-10-04 | Disposition: A | Discharge status: Home / Self Care | Payer: Medicare Other | Source: Ambulatory Visit | Attending: Internal Medicine | Admitting: Internal Medicine

## 2014-10-04 DIAGNOSIS — Z1231 Encounter for screening mammogram for malignant neoplasm of breast: Secondary | ICD-10-CM | POA: Diagnosis present

## 2014-11-02 ENCOUNTER — Ambulatory Visit (INDEPENDENT_AMBULATORY_CARE_PROVIDER_SITE_OTHER): Payer: Medicare Other | Admitting: Urology

## 2014-11-02 DIAGNOSIS — R3915 Urgency of urination: Secondary | ICD-10-CM | POA: Diagnosis not present

## 2014-11-02 DIAGNOSIS — R350 Frequency of micturition: Secondary | ICD-10-CM | POA: Diagnosis not present

## 2014-11-02 DIAGNOSIS — N3946 Mixed incontinence: Secondary | ICD-10-CM | POA: Diagnosis not present

## 2015-10-12 ENCOUNTER — Encounter (INDEPENDENT_AMBULATORY_CARE_PROVIDER_SITE_OTHER): Payer: Self-pay | Admitting: Internal Medicine

## 2015-10-17 ENCOUNTER — Encounter (INDEPENDENT_AMBULATORY_CARE_PROVIDER_SITE_OTHER): Payer: Self-pay

## 2015-10-17 ENCOUNTER — Encounter (INDEPENDENT_AMBULATORY_CARE_PROVIDER_SITE_OTHER): Payer: Self-pay | Admitting: Internal Medicine

## 2015-10-17 ENCOUNTER — Ambulatory Visit (INDEPENDENT_AMBULATORY_CARE_PROVIDER_SITE_OTHER): Payer: Medicare Other | Admitting: Internal Medicine

## 2015-10-17 VITALS — BP 122/54 | HR 60 | Temp 97.6°F | Ht 63.0 in | Wt 167.9 lb

## 2015-10-17 DIAGNOSIS — E7800 Pure hypercholesterolemia, unspecified: Secondary | ICD-10-CM

## 2015-10-17 DIAGNOSIS — R197 Diarrhea, unspecified: Secondary | ICD-10-CM | POA: Diagnosis not present

## 2015-10-17 HISTORY — DX: Pure hypercholesterolemia, unspecified: E78.00

## 2015-10-17 NOTE — Progress Notes (Signed)
   Subjective:    Patient ID: Breanna Hill, female    DOB: 1940/04/15, 75 y.o.   MRN: [id number]  HPIReferred by Dr. Willey Blade for diarrhea. She has had diarrhea for about 5 weeks. She says the diarrhea is better now. She is having 4-5 stools a day. Stools now are formed but loose. Five weeks ago her stools were very runny. She has tried OTC for diarrhea.  She says she ate tomatoes and the diarrhea started again. Normally, she has 2 stools a day. She leans toward constipation. No recent antibiotics. Her last colonoscopy was in 2005 by Dr. Laural Golden. Two small polyps snared from ascending colon. Small external hemorrhoids felt to be source of patient's intermittent hematochezia.  No biopsy.  Appetite is good. No weight loss.  She has flatus.  No melena or BRRB.  No family hx of colon cancer. Hemorrhoidectomy years ago in [place].    01/10/2015 H and H 13.4 and 40.2, MCV 80    Review of Systems Past Medical History:  Diagnosis Date  . High cholesterol 10/17/2015    Past Surgical History:  Procedure Laterality Date  . BACK SURGERY      Allergies  Allergen Reactions  . Penicillins Rash    Current Outpatient Prescriptions on File Prior to Visit  Medication Sig Dispense Refill  . atorvastatin (LIPITOR) 20 MG tablet Take 1 tablet by mouth daily.    Marland Kitchen levothyroxine (SYNTHROID, LEVOTHROID) 100 MCG tablet Take 1 tablet by mouth daily.    Marland Kitchen MYRBETRIQ 50 MG TB24 tablet Take 1 tablet by mouth daily.    Marland Kitchen docusate sodium (COLACE) 100 MG capsule Take 400 mg by mouth daily.     No current facility-administered medications on file prior to visit.        Objective:   Physical Exam Blood pressure (!) 122/54, pulse 60, temperature 97.6 F (36.4 C), height 5\' 3"  (1.6 m), weight 167 lb 14.4 oz (76.2 kg). Alert and oriented. Skin warm and dry. Oral mucosa is moist.   . Sclera anicteric, conjunctivae is pink. Thyroid not enlarged. No cervical lymphadenopathy. Lungs clear. Heart regular rate and  rhythm.  Abdomen is soft. Bowel sounds are positive. No hepatomegaly. No abdominal masses felt. No tenderness.  No edema to lower extremities.  Stool brown and guaiac negative.         Assessment & Plan:  Diarrhea resolving. Take Imodium once in am and once in pm. GI pathogen.  Patient declined colonoscopy today.

## 2015-10-17 NOTE — Patient Instructions (Addendum)
Imodium one in am and one in pm.  GI pathogen.

## 2015-10-19 ENCOUNTER — Other Ambulatory Visit (HOSPITAL_COMMUNITY): Payer: Self-pay | Admitting: Internal Medicine

## 2015-10-19 DIAGNOSIS — Z1231 Encounter for screening mammogram for malignant neoplasm of breast: Secondary | ICD-10-CM

## 2015-10-24 ENCOUNTER — Telehealth (INDEPENDENT_AMBULATORY_CARE_PROVIDER_SITE_OTHER): Payer: Self-pay | Admitting: Internal Medicine

## 2015-10-24 NOTE — Telephone Encounter (Signed)
She will call next week and decide if she wants a colonoscopy.

## 2015-10-24 NOTE — Telephone Encounter (Signed)
Patient called and left a message, would like to speak to Terri.  [phone]

## 2015-10-24 NOTE — Telephone Encounter (Signed)
No diarrhea now. She is still very gassy.

## 2015-10-31 ENCOUNTER — Telehealth (INDEPENDENT_AMBULATORY_CARE_PROVIDER_SITE_OTHER): Payer: Self-pay | Admitting: *Deleted

## 2015-10-31 ENCOUNTER — Ambulatory Visit (HOSPITAL_COMMUNITY)
Admission: RE | Admit: 2015-10-31 | Discharge: 2015-10-31 | Disposition: A | Discharge status: Home / Self Care | Payer: Medicare Other | Source: Ambulatory Visit | Attending: Internal Medicine | Admitting: Internal Medicine

## 2015-10-31 ENCOUNTER — Encounter (INDEPENDENT_AMBULATORY_CARE_PROVIDER_SITE_OTHER): Payer: Self-pay | Admitting: *Deleted

## 2015-10-31 ENCOUNTER — Other Ambulatory Visit (INDEPENDENT_AMBULATORY_CARE_PROVIDER_SITE_OTHER): Payer: Self-pay | Admitting: Internal Medicine

## 2015-10-31 DIAGNOSIS — Z1231 Encounter for screening mammogram for malignant neoplasm of breast: Secondary | ICD-10-CM

## 2015-10-31 DIAGNOSIS — Z86010 Personal history of colonic polyps: Secondary | ICD-10-CM | POA: Insufficient documentation

## 2015-10-31 NOTE — Telephone Encounter (Signed)
Ann, u can go ahead and schedule. I did not reach her at home.

## 2015-10-31 NOTE — Telephone Encounter (Signed)
TCS sch'd 01/11/16, patient aware instructions mailed

## 2015-10-31 NOTE — Telephone Encounter (Signed)
Patient left message, she wanted to sch'd TCS, per your note patient was to call back, please advise if ok to schedule

## 2015-10-31 NOTE — Telephone Encounter (Signed)
Patient needs trilyte 

## 2015-11-01 MED ORDER — PEG 3350-KCL-NA BICARB-NACL 420 G PO SOLR: 4000.0000 mL | Freq: Once | ORAL | 0 refills | Status: AC

## 2015-11-02 ENCOUNTER — Telehealth: Payer: Self-pay | Admitting: General Practice

## 2015-11-02 NOTE — Telephone Encounter (Signed)
Patient has called the office today (twice) asking  to be scheduled for a colonoscopy. I  told the patient that she is  established with NUR and had seen Deberah Castle recently and her colonoscopy has already been scheduled. Patient is wanting to transfer to [place] because she does not want to wait until January to have her colonoscopy. I told her again that we do not see each others patients and if she needed to be scheduled sooner she should call [place] office.

## 2016-01-09 ENCOUNTER — Encounter (INDEPENDENT_AMBULATORY_CARE_PROVIDER_SITE_OTHER): Payer: Self-pay | Admitting: *Deleted

## 2016-02-22 ENCOUNTER — Encounter (HOSPITAL_COMMUNITY)
Admission: RE | Disposition: A | Discharge status: Home Health | Payer: Self-pay | Source: Ambulatory Visit | Attending: Internal Medicine

## 2016-02-22 ENCOUNTER — Encounter (HOSPITAL_COMMUNITY): Payer: Self-pay | Admitting: *Deleted

## 2016-02-22 ENCOUNTER — Ambulatory Visit (HOSPITAL_COMMUNITY)
Admission: RE | Admit: 2016-02-22 | Discharge: 2016-02-22 | Disposition: A | Discharge status: Home Health | Payer: Medicare Other | Source: Ambulatory Visit | Attending: Internal Medicine | Admitting: Internal Medicine

## 2016-02-22 DIAGNOSIS — Z79899 Other long term (current) drug therapy: Secondary | ICD-10-CM | POA: Insufficient documentation

## 2016-02-22 DIAGNOSIS — Z880 Allergy status to penicillin: Secondary | ICD-10-CM | POA: Diagnosis not present

## 2016-02-22 DIAGNOSIS — F329 Major depressive disorder, single episode, unspecified: Secondary | ICD-10-CM | POA: Diagnosis not present

## 2016-02-22 DIAGNOSIS — Z1211 Encounter for screening for malignant neoplasm of colon: Secondary | ICD-10-CM | POA: Diagnosis not present

## 2016-02-22 DIAGNOSIS — Z860100 Personal history of colon polyps, unspecified: Secondary | ICD-10-CM | POA: Insufficient documentation

## 2016-02-22 DIAGNOSIS — E039 Hypothyroidism, unspecified: Secondary | ICD-10-CM | POA: Insufficient documentation

## 2016-02-22 DIAGNOSIS — K219 Gastro-esophageal reflux disease without esophagitis: Secondary | ICD-10-CM | POA: Diagnosis not present

## 2016-02-22 DIAGNOSIS — E7800 Pure hypercholesterolemia, unspecified: Secondary | ICD-10-CM | POA: Diagnosis not present

## 2016-02-22 DIAGNOSIS — Z09 Encounter for follow-up examination after completed treatment for conditions other than malignant neoplasm: Secondary | ICD-10-CM | POA: Diagnosis not present

## 2016-02-22 HISTORY — DX: Gastro-esophageal reflux disease without esophagitis: K21.9

## 2016-02-22 HISTORY — DX: Hypothyroidism, unspecified: E03.9

## 2016-02-22 HISTORY — DX: Depression, unspecified: F32.A

## 2016-02-22 HISTORY — DX: Major depressive disorder, single episode, unspecified: F32.9

## 2016-02-22 HISTORY — PX: COLONOSCOPY: SHX5424

## 2016-02-22 SURGERY — COLONOSCOPY
Anesthesia: Moderate Sedation

## 2016-02-22 MED ORDER — MIDAZOLAM HCL 5 MG/5ML IJ SOLN
INTRAMUSCULAR | Status: DC | PRN
  Administered 2016-02-22: 1 mg via INTRAVENOUS
  Administered 2016-02-22: 2 mg via INTRAVENOUS

## 2016-02-22 MED ORDER — MIDAZOLAM HCL 5 MG/5ML IJ SOLN
INTRAMUSCULAR | Status: AC
  Filled 2016-02-22: qty 10

## 2016-02-22 MED ORDER — MEPERIDINE HCL 50 MG/ML IJ SOLN
INTRAMUSCULAR | Status: AC
  Filled 2016-02-22: qty 1

## 2016-02-22 MED ORDER — STERILE WATER FOR IRRIGATION IR SOLN
Status: DC | PRN
  Administered 2016-02-22: 2.5 mL

## 2016-02-22 MED ORDER — MEPERIDINE HCL 50 MG/ML IJ SOLN
INTRAMUSCULAR | Status: DC | PRN
  Administered 2016-02-22: 25 mg via INTRAVENOUS

## 2016-02-22 MED ORDER — SODIUM CHLORIDE 0.9 % IV SOLN
INTRAVENOUS | Status: DC
  Administered 2016-02-22: 14:00:00 via INTRAVENOUS

## 2016-02-22 NOTE — Discharge Instructions (Signed)
Resume usual medications and diet. No driving for 24 hours.  Colonoscopy, Adult, Care After This sheet gives you information about how to care for yourself after your procedure. Your health care provider may also give you more specific instructions. If you have problems or questions, contact your health care provider. What can I expect after the procedure? After the procedure, it is common to have:  A small amount of blood in your stool for 24 hours after the procedure.  Some gas.  Mild abdominal cramping or bloating. Follow these instructions at home: General instructions  For the first 24 hours after the procedure:  Do not drive or use machinery.  Do not sign important documents.  Do not drink alcohol.  Do your regular daily activities at a slower pace than normal.  Eat soft, easy-to-digest foods.  Rest often.  Take over-the-counter or prescription medicines only as told by your health care provider.  It is up to you to get the results of your procedure. Ask your health care provider, or the department performing the procedure, when your results will be ready. Relieving cramping and bloating  Try walking around when you have cramps or feel bloated.  Apply heat to your abdomen as told by your health care provider. Use a heat source that your health care provider recommends, such as a moist heat pack or a heating pad.  Place a towel between your skin and the heat source.  Leave the heat on for 20-30 minutes.  Remove the heat if your skin turns bright red. This is especially important if you are unable to feel pain, heat, or cold. You may have a greater risk of getting burned. Eating and drinking  Drink enough fluid to keep your urine clear or pale yellow.  Resume your normal diet as instructed by your health care provider. Avoid heavy or fried foods that are hard to digest.  Avoid drinking alcohol for as long as instructed by your health care provider. Contact a  health care provider if:  You have blood in your stool 2-3 days after the procedure. Get help right away if:  You have more than a small spotting of blood in your stool.  You pass large blood clots in your stool.  Your abdomen is swollen.  You have nausea or vomiting.  You have a fever.  You have increasing abdominal pain that is not relieved with medicine.

## 2016-02-22 NOTE — Op Note (Signed)
[facility] Patient Name: Breanna Hill Procedure Date: 02/22/2016 2:11 PM MRN: [id number] Date of Birth: 1940-01-15 Attending MD: Hildred Laser , MD CSN: [id number] Age: 76 Admit Type: Outpatient Procedure:                Colonoscopy Indications:              High risk colon cancer surveillance: Personal                            history of colonic polyps Providers:                Hildred Laser, MD, Charlyne Petrin RN, RN, Aram Candela Referring MD:             Asencion Noble, MD Medicines:                Meperidine 25 mg IV, Midazolam 3 mg IV Complications:            No immediate complications. Estimated Blood Loss:     Estimated blood loss: none. Procedure:                Pre-Anesthesia Assessment:                           - Prior to the procedure, a History and Physical                            was performed, and patient medications and                            allergies were reviewed. The patient's tolerance of                            previous anesthesia was also reviewed. The risks                            and benefits of the procedure and the sedation                            options and risks were discussed with the patient.                            All questions were answered, and informed consent                            was obtained. Prior Anticoagulants: The patient                            last took ibuprofen 14 days prior to the procedure.                            ASA Grade Assessment: II - A patient with mild  systemic disease. After reviewing the risks and                            benefits, the patient was deemed in satisfactory                            condition to undergo the procedure.                           After obtaining informed consent, the colonoscope                            was passed under direct vision. Throughout the                            procedure, the patient's  blood pressure, pulse, and                            oxygen saturations were monitored continuously. The                            [id number]) scope was introduced through                            the anus and advanced to the the cecum, identified                            by appendiceal orifice and ileocecal valve. The                            colonoscopy was performed without difficulty. The                            patient tolerated the procedure well. The quality                            of the bowel preparation was excellent. The                            ileocecal valve, appendiceal orifice, and rectum                            were photographed. Scope In: 2:39:56 PM Scope Out: 2:58:40 PM Scope Withdrawal Time: 0 hours 10 minutes 33 seconds  Total Procedure Duration: 0 hours 18 minutes 44 seconds  Findings:      The perianal and digital rectal examinations were normal.      The colon (entire examined portion) appeared normal.      The retroflexed view of the distal rectum and anal verge was normal and       showed no anal or rectal abnormalities. Impression:               - The entire examined colon is normal.                           -  No specimens collected. Moderate Sedation:      Moderate (conscious) sedation was administered by the endoscopy nurse       and supervised by the endoscopist. The following parameters were       monitored: oxygen saturation, heart rate, blood pressure, CO2       capnography and response to care. Total physician intraservice time was       22 minutes. Recommendation:           - Patient has a contact number available for                            emergencies. The signs and symptoms of potential                            delayed complications were discussed with the                            patient. Return to normal activities tomorrow.                            Written discharge instructions were provided to the                             patient.                           - Resume previous diet today.                           - Continue present medications.                           - No repeat colonoscopy due to age and the absence                            of advanced adenomas. Procedure Code(s):        --- Professional ---                           [phone], Colonoscopy, flexible; diagnostic, including                            collection of specimen(s) by brushing or washing,                            when performed (separate procedure)                           [id number], Moderate sedation services provided by the                            same physician or other qualified health care                            professional performing the diagnostic or  therapeutic service that the sedation supports,                            requiring the presence of an independent trained                            observer to assist in the monitoring of the                            patient's level of consciousness and physiological                            status; initial 15 minutes of intraservice time,                            patient age 56 years or older Diagnosis Code(s):        --- Professional ---                           Z86.010, Personal history of colonic polyps CPT copyright 2016 American [employer] Association. All rights reserved. The codes documented in this report are preliminary and upon coder review may  be revised to meet current compliance requirements. Hildred Laser, MD Hildred Laser, MD 02/22/2016 3:04:13 PM This report has been signed electronically. Number of Addenda: 0

## 2016-02-22 NOTE — H&P (Signed)
Breanna Hill is an 76 y.o. female.   Chief Complaint: Patient is here for colonoscopy. HPI: Patient is 76 year old Caucasian female was history of colonic adenomas and is here for surveillance colonoscopy. She had 2 adenomas removed on colonoscopy of 2005. Last exam was about 7 years ago and she did not have any polyps. She states she had diarrhea lasting about a month but she is not having normal stools. She denies abdominal pain or rectal bleeding. Family history is negative for CRC.  Past Medical History:  Diagnosis Date  . Depression   . GERD (gastroesophageal reflux disease)   . High cholesterol 10/17/2015  . Hypothyroidism     Past Surgical History:  Procedure Laterality Date  . BACK SURGERY    . COLONOSCOPY     X 2  . Left shoultder surgery    . Right bunionectomy      Family History  Problem Relation Age of Onset  . Colon cancer Neg Hx    Social History:  reports that she has never smoked. She has never used smokeless tobacco. She reports that she drinks alcohol. She reports that she does not use drugs.  Allergies:  Allergies  Allergen Reactions  . Penicillins Rash    Has patient had a PCN reaction causing immediate rash, facial/tongue/throat swelling, SOB or lightheadedness with hypotension: No Has patient had a PCN reaction causing severe rash involving mucus membranes or skin necrosis: No Has patient had a PCN reaction that required hospitalization: No Has patient had a PCN reaction occurring within the last 10 years: No If all of the above answers are "NO", then may proceed with Cephalosporin use.     Medications Prior to Admission  Medication Sig Dispense Refill  . atorvastatin (LIPITOR) 20 MG tablet Take 20 mg by mouth daily at 6 PM.     . ibuprofen (ADVIL,MOTRIN) 200 MG tablet Take 200 mg by mouth daily as needed for mild pain.    Marland Kitchen levothyroxine (SYNTHROID, LEVOTHROID) 100 MCG tablet Take 100 mcg by mouth daily before breakfast.     . MYRBETRIQ 50 MG  TB24 tablet Take 50 mg by mouth daily.     Marland Kitchen oseltamivir (TAMIFLU) 75 MG capsule Take 75 mg by mouth daily.      No results found for this or any previous visit (from the past 48 hour(s)). No results found.  ROS  Blood pressure (!) 155/67, pulse (!) 58, temperature 98.4 F (36.9 C), temperature source Oral, resp. rate 16, height 5' 2.5" (1.588 m), weight 150 lb (68 kg), SpO2 99 %. Physical Exam  Constitutional: She appears well-developed and well-nourished.  HENT:  Mouth/Throat: Oropharynx is clear and moist.  Eyes: Conjunctivae are normal. No scleral icterus.  Neck: No thyromegaly present.  Cardiovascular: Normal rate, regular rhythm and normal heart sounds.   No murmur heard. Respiratory: Effort normal and breath sounds normal.  GI: She exhibits no distension and no mass. There is no tenderness.  Musculoskeletal: She exhibits no edema.  Lymphadenopathy:    She has no cervical adenopathy.  Neurological: She is alert.  Skin: Skin is warm and dry.     Assessment/Plan History of colonic polyps. Surveillance colonoscopy.  Hildred Laser, MD 02/22/2016, 2:32 PM

## 2016-02-23 ENCOUNTER — Encounter (HOSPITAL_COMMUNITY): Payer: Self-pay | Admitting: Internal Medicine

## 2017-01-09 ENCOUNTER — Other Ambulatory Visit (HOSPITAL_COMMUNITY): Payer: Self-pay | Admitting: Internal Medicine

## 2017-01-09 DIAGNOSIS — Z1231 Encounter for screening mammogram for malignant neoplasm of breast: Secondary | ICD-10-CM

## 2017-01-10 ENCOUNTER — Encounter (HOSPITAL_COMMUNITY): Payer: Self-pay

## 2017-01-10 ENCOUNTER — Ambulatory Visit (HOSPITAL_COMMUNITY)
Admission: RE | Admit: 2017-01-10 | Discharge: 2017-01-10 | Disposition: A | Discharge status: Home / Self Care | Payer: Medicare HMO | Source: Ambulatory Visit | Attending: Internal Medicine | Admitting: Internal Medicine

## 2017-01-10 DIAGNOSIS — Z1231 Encounter for screening mammogram for malignant neoplasm of breast: Secondary | ICD-10-CM | POA: Insufficient documentation

## 2017-01-14 ENCOUNTER — Other Ambulatory Visit (HOSPITAL_COMMUNITY): Payer: Self-pay | Admitting: Internal Medicine

## 2017-01-14 DIAGNOSIS — R928 Other abnormal and inconclusive findings on diagnostic imaging of breast: Secondary | ICD-10-CM

## 2017-01-29 ENCOUNTER — Ambulatory Visit (HOSPITAL_COMMUNITY)
Admission: RE | Admit: 2017-01-29 | Discharge: 2017-01-29 | Disposition: A | Discharge status: Home / Self Care | Payer: Medicare HMO | Source: Ambulatory Visit | Attending: Internal Medicine | Admitting: Internal Medicine

## 2017-01-29 DIAGNOSIS — R928 Other abnormal and inconclusive findings on diagnostic imaging of breast: Secondary | ICD-10-CM | POA: Diagnosis not present

## 2017-01-29 DIAGNOSIS — N6322 Unspecified lump in the left breast, upper inner quadrant: Secondary | ICD-10-CM | POA: Diagnosis not present

## 2017-01-29 DIAGNOSIS — N6320 Unspecified lump in the left breast, unspecified quadrant: Secondary | ICD-10-CM | POA: Insufficient documentation

## 2017-01-29 DIAGNOSIS — R922 Inconclusive mammogram: Secondary | ICD-10-CM | POA: Diagnosis not present

## 2017-01-30 ENCOUNTER — Other Ambulatory Visit: Payer: Self-pay | Admitting: Internal Medicine

## 2017-01-30 DIAGNOSIS — N6320 Unspecified lump in the left breast, unspecified quadrant: Secondary | ICD-10-CM

## 2017-02-01 HISTORY — PX: BREAST LUMPECTOMY: SHX2

## 2017-02-04 ENCOUNTER — Ambulatory Visit
Admission: RE | Admit: 2017-02-04 | Discharge: 2017-02-04 | Disposition: A | Discharge status: Home / Self Care | Payer: Medicare HMO | Source: Ambulatory Visit | Attending: Internal Medicine | Admitting: Internal Medicine

## 2017-02-04 ENCOUNTER — Other Ambulatory Visit: Payer: Self-pay | Admitting: Internal Medicine

## 2017-02-04 DIAGNOSIS — R928 Other abnormal and inconclusive findings on diagnostic imaging of breast: Secondary | ICD-10-CM | POA: Diagnosis not present

## 2017-02-04 DIAGNOSIS — N6489 Other specified disorders of breast: Secondary | ICD-10-CM | POA: Diagnosis not present

## 2017-02-04 DIAGNOSIS — N6320 Unspecified lump in the left breast, unspecified quadrant: Secondary | ICD-10-CM

## 2017-02-04 DIAGNOSIS — C50212 Malignant neoplasm of upper-inner quadrant of left female breast: Secondary | ICD-10-CM | POA: Diagnosis not present

## 2017-02-04 HISTORY — PX: BREAST BIOPSY: SHX20

## 2017-02-08 ENCOUNTER — Ambulatory Visit: Payer: Self-pay | Admitting: Surgery

## 2017-02-08 DIAGNOSIS — C50912 Malignant neoplasm of unspecified site of left female breast: Secondary | ICD-10-CM | POA: Diagnosis not present

## 2017-02-08 NOTE — H&P (Signed)
History of Present Illness Breanna Hill. Breanna Sitts MD; 02/08/2017 12:47 PM) The patient is a 77 year old female who presents with breast cancer. Referred by Dr. Asencion Noble for left breast cancer  This is a 77 year old female in good health who presents with recent diagnosis of left breast cancer. She had a routine screening mammogram that showed some suspicious findings in the left breast. She was found to have 2 subcentimeter adjacent nodules suspicious for breast cancer. The larger lesion was biopsied and revealed invasive ductal carcinoma grade 1. Prognostic panel is pending. Unfortunately the biopsy clip migrated laterally for a distance of about 3 cm. However the nodules are still visible on mammogram.  The patient has a husband with Parkinson disease for she is the primary caregiver.  Menarche age 73 First pregnancy age 71 Menopause age 1 Breast-feed yes Hormones no Family history breast cancer in 2 maternal aunts   CLINICAL DATA: Screening.  EXAM: 2D DIGITAL SCREENING BILATERAL MAMMOGRAM WITH 3D TOMO WITH CAD  COMPARISON: Previous exam(s).  ACR Breast Density Category c: The breast tissue is heterogeneously dense, which may obscure small masses.  FINDINGS: In the left breast, possible distortion warrants further evaluation. In the right breast, no findings suspicious for malignancy. Images were processed with CAD.  IMPRESSION: Further evaluation is suggested for possible distortion in the left breast.  RECOMMENDATION: Diagnostic mammogram and possibly ultrasound of the left breast. (Code:[id number])  The patient will be contacted regarding the findings, and additional imaging will be scheduled.  BI-RADS CATEGORY 0: Incomplete. Need additional imaging evaluation and/or prior mammograms for comparison.   Electronically Signed By: Dorise Bullion III M.D On: 01/10/2017 16:28  CLINICAL DATA: Screening recall for possible left breast distortion.  EXAM: 2D  DIGITAL DIAGNOSTIC UNILATERAL LEFT MAMMOGRAM WITH CAD AND ADJUNCT TOMO  LEFT BREAST ULTRASOUND  COMPARISON: Previous exam(s).  ACR Breast Density Category c: The breast tissue is heterogeneously dense, which may obscure small masses.  FINDINGS: Spot compression CC and MLO tomograms were performed of the left breast. There are 2 spiculated adjacent masses in the central to slightly inner far posterior left breast together measuring 1.1 cm.  Mammographic images were processed with CAD.  Physical examination of the upper inner left breast does not definitely reveal any palpable masses.  Targeted ultrasound of the entire inner and central left breast was performed. There is an irregular mass in the left breast at 10 o'clock 3 cm from nipple posterior depth measuring 0.5 x 0.5 x 0.4 cm. This mass is difficult to visualize and reproduce, vague in appearance and deep in location. The additional adjacent mass was not definitely seen. No lymphadenopathy seen in the left axilla.  IMPRESSION: Two highly suspicious adjacent spiculated masses in the left breast together measuring 1.1 cm, best seen mammographically.  RECOMMENDATION: 1. Stereotactic guided biopsy of the larger spiculated mass in the left breast is recommended. This will be scheduled for the patient.  2. Depending on pathology results and if surgery is planned in the future, both masses should be localized and removed at time of surgery.  I have discussed the findings and recommendations with the patient. Results were also provided in writing at the conclusion of the visit. If applicable, a reminder letter will be sent to the patient regarding the next appointment.  BI-RADS CATEGORY 5: Highly suggestive of malignancy.   Electronically Signed By: Everlean Alstrom M.D. On: 01/29/2017 11:23  CLINICAL DATA: 77 year old female presenting for stereotactic biopsy of left breast mass.  EXAM: LEFT BREAST  STEREOTACTIC CORE NEEDLE BIOPSY  COMPARISON: Previous exams.  FINDINGS: The patient and I discussed the procedure of stereotactic-guided biopsy including benefits and alternatives. We discussed the high likelihood of a successful procedure. We discussed the risks of the procedure including infection, bleeding, tissue injury, clip migration, and inadequate sampling. Informed written consent was given. The usual time out protocol was performed immediately prior to the procedure.  Using sterile technique and 1% Lidocaine as local anesthetic, under stereotactic guidance, a 9 gauge vacuum assisted device was used to perform core needle biopsy of distortion in the medial left breast. Using a medial approach.  Lesion quadrant: Upper inner quadrant  At the conclusion of the procedure, a coil shaped tissue marker clip was deployed into the biopsy cavity. Follow-up 2-view mammogram was performed and dictated separately.  IMPRESSION: Stereotactic-guided biopsy of a left breast mass. No apparent complications.  Electronically Signed: By: Ammie Ferrier M.D. On: 02/04/2017 10:38   CLINICAL DATA: Post biopsy mammogram of the left breast for clip placement.  EXAM: DIAGNOSTIC LEFT MAMMOGRAM POST STEREOTACTIC BIOPSY  COMPARISON: Previous exam(s).  FINDINGS: Mammographic images were obtained following stereotactic guided biopsy of a spiculated mass in the slightly upper inner left breast. The coil shaped biopsy marking clip appears to have migrated approximately 3 cm lateral to the site of biopsy. The small mass which was biopsied is not visualized, therefore I believe that the correct location was sampled, and that the clip truly did migrate. The smaller adjacent spiculated mass is still visible, and the coil shaped biopsy marking clip is approximately 2 cm lateral to this mass.  IMPRESSION: The coil shaped biopsy marking clip is approximately 3 cm lateral to the biopsy  target. See above discussion.  I did discuss the migration of clip with the patient and told her that I believe the correct lesion was biopsied, but that pathology would confirm that for [place]. If the pathology results indicate benign breast tissue or some other benign process, I would recommend a repeat stereotactic biopsy for the smaller adjacent spiculated mass in the upper inner left breast. I did offer to perform the stereotactic biopsy of the smaller mass today, however the patient was comfortable with waiting for the pathology results from this biopsy first. If surgical excision is ultimately recommended, we can target the smaller mass for localization.  Final Assessment: Post Procedure Mammograms for Marker Placement   Electronically Signed By: Ammie Ferrier M.D. 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 This was found to be concordant by Dr. Ammie Ferrier. Pathology results were discussed with the patient by telephone. The patient reported doing well after the biopsy with tenderness at the site. Post biopsy instructions and care were reviewed and questions were answered. The patient was encouraged to call [facility] for any additional concerns. Surgical consultation has been arranged with Dr. Donnie Mesa at [facility], per patient request, on February 08, 2017.  Surgical consideration: There is a highly suspicious 5 mm mass in the left breast at 10 o'clock 3 cm from nipple close in proximity to the biopsied mass (described on the diagnostic report). Since the clip migrated from the biopsy site, this smaller mass will need to be the target for localization prior to surgery. If neoadjuvant chemotherapy is planned, then clip placement is recommended as the lesion will likely be too small to visualize. Dr. Georgette Dover was alerted of this  concern via Epic  email today at 504pm.  Pathology results reported by Terie Purser, RN on 02/06/2017.   Electronically Signed By: Ammie Ferrier M.D. On: 02/06/2017 15:23   Past Surgical History Levonne Spiller, CMA; 02/08/2017 11:28 AM) Breast Biopsy Left. Foot Surgery Right. Shoulder Surgery Left. Tonsillectomy  Diagnostic Studies History Levonne Spiller, CMA; 02/08/2017 11:28 AM) Colonoscopy within last year  Allergies Levonne Spiller, [place]; 02/08/2017 11:29 AM) Penicillins Allergies Reconciled  Medication History Levonne Spiller, CMA; 02/08/2017 11:30 AM) Escitalopram Oxalate (10MG  Tablet, Oral) Active. Atorvastatin Calcium (20MG  Tablet, Oral) Active. Levothyroxine Sodium (100MCG Tablet, Oral) Active. Myrbetriq (50MG  Tablet ER 24HR, Oral) Active. Ibuprofen (200MG  Tablet, Oral) Active. Medications Reconciled  Social History Andee Poles Education officer, [employer], CMA; 02/08/2017 11:28 AM) Alcohol use Occasional alcohol use. No drug use Tobacco use Never smoker.  Family History Levonne Spiller, CMA; 02/08/2017 11:28 AM) Heart disease in female family member before age 75 Kidney Disease Mother.  Pregnancy / Birth History Levonne Spiller, CMA; 02/08/2017 11:28 AM) Age at menarche 34 years. Age of menopause 110-60 Gravida 2 Length (months) of breastfeeding 7-12 Maternal age 49-25 Para 2  Other Problems Levonne Spiller, [place]; 02/08/2017 11:28 AM) Bladder Problems Hemorrhoids Hypercholesterolemia     Review of Systems Andee Poles Gerrigner CMA; 02/08/2017 11:28 AM) General Not Present- Appetite Loss, Chills, Fatigue, Fever, Night Sweats, Weight Gain and Weight Loss. Skin Not Present- Change in Wart/Mole, Dryness, Hives, Jaundice, New Lesions, Non-Healing Wounds, Rash and Ulcer. Respiratory Not Present- Bloody sputum, Chronic Cough, Difficulty Breathing, Snoring and Wheezing. Gastrointestinal Not Present- Abdominal Pain, Bloating, Bloody Stool,  Change in Bowel Habits, Chronic diarrhea, Constipation, Difficulty Swallowing, Excessive gas, Gets full quickly at meals, Hemorrhoids, Indigestion, Nausea, Rectal Pain and Vomiting. Female Genitourinary Present- Nocturia. Not Present- Frequency, Painful Urination, Pelvic Pain and Urgency. Musculoskeletal Not Present- Back Pain, Joint Pain, Joint Stiffness, Muscle Pain, Muscle Weakness and Swelling of Extremities. Psychiatric Not Present- Anxiety, Bipolar, Change in Sleep Pattern, Depression, Fearful and Frequent crying. Endocrine Not Present- Cold Intolerance, Excessive Hunger, Hair Changes, Heat Intolerance, Hot flashes and New Diabetes. Hematology Not Present- Blood Thinners, Easy Bruising, Excessive bleeding, Gland problems, HIV and Persistent Infections.  Vitals Andee Poles Gerrigner CMA; 02/08/2017 11:30 AM) 02/08/2017 11:30 AM Weight: 171.13 lb Height: 63in Body Surface Area: 1.81 m Body Mass Index: 30.31 kg/m  Temp.: 98.53F(Oral)  Pulse: 64 (Regular)  BP: 108/80 (Sitting, Left Arm, Standard)      Physical Exam Rodman Key K. Malyk Girouard MD; 02/08/2017 12:47 PM)  The physical exam findings are as follows: Note:WDWN in NAD Eyes: Pupils equal, round; sclera anicteric HENT: Oral mucosa moist; good dentition Neck: No masses palpated, no thyromegaly Lungs: CTA bilaterally; normal respiratory effort Breasts: symmetric; no nipple retraction or discharge, no axillary lymphadenopathy; no palpable masses CV: Regular rate and rhythm; no murmurs; extremities well-perfused with no edema Abd: +bowel sounds, soft, non-tender, no palpable organomegaly; no palpable hernias Skin: Warm, dry; no sign of jaundice Psychiatric - alert and oriented x 4; calm mood and affect    Assessment & Plan Rodman Key K. 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  Current Plans Schedule for Surgery - Left radioactive seed localized lumpectomy/ left sentinel lymph node biopsy. The surgical procedure has been discussed with the patient. Potential risks, benefits, alternative treatments, and expected outcomes have been explained. All of the patient's questions at this time have been answered. The likelihood of  reaching the patient's treatment goal is good. The patient understand the proposed surgical procedure and wishes to proceed.  Breanna Hill. Georgette Dover, MD, [facility]  General/ Trauma Surgery  02/08/2017 12:48 PM

## 2017-02-08 NOTE — H&P (View-Only) (Signed)
History of Present Illness Breanna Hill. Virginia Curl MD; 02/08/2017 12:47 PM) The patient is a 77 year old female who presents with breast cancer. Referred by Dr. Asencion Noble for left breast cancer  This is a 77 year old female in good health who presents with recent diagnosis of left breast cancer. She had a routine screening mammogram that showed some suspicious findings in the left breast. She was found to have 2 subcentimeter adjacent nodules suspicious for breast cancer. The larger lesion was biopsied and revealed invasive ductal carcinoma grade 1. Prognostic panel is pending. Unfortunately the biopsy clip migrated laterally for a distance of about 3 cm. However the nodules are still visible on mammogram.  The patient has a husband with Parkinson disease for she is the primary caregiver.  Menarche age 89 First pregnancy age 56 Menopause age 44 Breast-feed yes Hormones no Family history breast cancer in 2 maternal aunts   CLINICAL DATA: Screening.  EXAM: 2D DIGITAL SCREENING BILATERAL MAMMOGRAM WITH 3D TOMO WITH CAD  COMPARISON: Previous exam(s).  ACR Breast Density Category c: The breast tissue is heterogeneously dense, which may obscure small masses.  FINDINGS: In the left breast, possible distortion warrants further evaluation. In the right breast, no findings suspicious for malignancy. Images were processed with CAD.  IMPRESSION: Further evaluation is suggested for possible distortion in the left breast.  RECOMMENDATION: Diagnostic mammogram and possibly ultrasound of the left breast. (Code:[id number])  The patient will be contacted regarding the findings, and additional imaging will be scheduled.  BI-RADS CATEGORY 0: Incomplete. Need additional imaging evaluation and/or prior mammograms for comparison.   Electronically Signed By: Dorise Bullion III M.D On: 01/10/2017 16:28  CLINICAL DATA: Screening recall for possible left breast distortion.  EXAM: 2D  DIGITAL DIAGNOSTIC UNILATERAL LEFT MAMMOGRAM WITH CAD AND ADJUNCT TOMO  LEFT BREAST ULTRASOUND  COMPARISON: Previous exam(s).  ACR Breast Density Category c: The breast tissue is heterogeneously dense, which may obscure small masses.  FINDINGS: Spot compression CC and MLO tomograms were performed of the left breast. There are 2 spiculated adjacent masses in the central to slightly inner far posterior left breast together measuring 1.1 cm.  Mammographic images were processed with CAD.  Physical examination of the upper inner left breast does not definitely reveal any palpable masses.  Targeted ultrasound of the entire inner and central left breast was performed. There is an irregular mass in the left breast at 10 o'clock 3 cm from nipple posterior depth measuring 0.5 x 0.5 x 0.4 cm. This mass is difficult to visualize and reproduce, vague in appearance and deep in location. The additional adjacent mass was not definitely seen. No lymphadenopathy seen in the left axilla.  IMPRESSION: Two highly suspicious adjacent spiculated masses in the left breast together measuring 1.1 cm, best seen mammographically.  RECOMMENDATION: 1. Stereotactic guided biopsy of the larger spiculated mass in the left breast is recommended. This will be scheduled for the patient.  2. Depending on pathology results and if surgery is planned in the future, both masses should be localized and removed at time of surgery.  I have discussed the findings and recommendations with the patient. Results were also provided in writing at the conclusion of the visit. If applicable, a reminder letter will be sent to the patient regarding the next appointment.  BI-RADS CATEGORY 5: Highly suggestive of malignancy.   Electronically Signed By: Everlean Alstrom M.D. On: 01/29/2017 11:23  CLINICAL DATA: 77 year old female presenting for stereotactic biopsy of left breast mass.  EXAM: LEFT BREAST  STEREOTACTIC CORE NEEDLE BIOPSY  COMPARISON: Previous exams.  FINDINGS: The patient and I discussed the procedure of stereotactic-guided biopsy including benefits and alternatives. We discussed the high likelihood of a successful procedure. We discussed the risks of the procedure including infection, bleeding, tissue injury, clip migration, and inadequate sampling. Informed written consent was given. The usual time out protocol was performed immediately prior to the procedure.  Using sterile technique and 1% Lidocaine as local anesthetic, under stereotactic guidance, a 9 gauge vacuum assisted device was used to perform core needle biopsy of distortion in the medial left breast. Using a medial approach.  Lesion quadrant: Upper inner quadrant  At the conclusion of the procedure, a coil shaped tissue marker clip was deployed into the biopsy cavity. Follow-up 2-view mammogram was performed and dictated separately.  IMPRESSION: Stereotactic-guided biopsy of a left breast mass. No apparent complications.  Electronically Signed: By: Ammie Ferrier M.D. On: 02/04/2017 10:38   CLINICAL DATA: Post biopsy mammogram of the left breast for clip placement.  EXAM: DIAGNOSTIC LEFT MAMMOGRAM POST STEREOTACTIC BIOPSY  COMPARISON: Previous exam(s).  FINDINGS: Mammographic images were obtained following stereotactic guided biopsy of a spiculated mass in the slightly upper inner left breast. The coil shaped biopsy marking clip appears to have migrated approximately 3 cm lateral to the site of biopsy. The small mass which was biopsied is not visualized, therefore I believe that the correct location was sampled, and that the clip truly did migrate. The smaller adjacent spiculated mass is still visible, and the coil shaped biopsy marking clip is approximately 2 cm lateral to this mass.  IMPRESSION: The coil shaped biopsy marking clip is approximately 3 cm lateral to the biopsy  target. See above discussion.  I did discuss the migration of clip with the patient and told her that I believe the correct lesion was biopsied, but that pathology would confirm that for [place]. If the pathology results indicate benign breast tissue or some other benign process, I would recommend a repeat stereotactic biopsy for the smaller adjacent spiculated mass in the upper inner left breast. I did offer to perform the stereotactic biopsy of the smaller mass today, however the patient was comfortable with waiting for the pathology results from this biopsy first. If surgical excision is ultimately recommended, we can target the smaller mass for localization.  Final Assessment: Post Procedure Mammograms for Marker Placement   Electronically Signed By: Ammie Ferrier M.D. 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 This was found to be concordant by Dr. Ammie Ferrier. Pathology results were discussed with the patient by telephone. The patient reported doing well after the biopsy with tenderness at the site. Post biopsy instructions and care were reviewed and questions were answered. The patient was encouraged to call [facility] for any additional concerns. Surgical consultation has been arranged with Dr. Donnie Mesa at [facility], per patient request, on February 08, 2017.  Surgical consideration: There is a highly suspicious 5 mm mass in the left breast at 10 o'clock 3 cm from nipple close in proximity to the biopsied mass (described on the diagnostic report). Since the clip migrated from the biopsy site, this smaller mass will need to be the target for localization prior to surgery. If neoadjuvant chemotherapy is planned, then clip placement is recommended as the lesion will likely be too small to visualize. Dr. Georgette Dover was alerted of this  concern via Epic  email today at 504pm.  Pathology results reported by Terie Purser, RN on 02/06/2017.   Electronically Signed By: Ammie Ferrier M.D. On: 02/06/2017 15:23   Past Surgical History Levonne Spiller, CMA; 02/08/2017 11:28 AM) Breast Biopsy Left. Foot Surgery Right. Shoulder Surgery Left. Tonsillectomy  Diagnostic Studies History Levonne Spiller, CMA; 02/08/2017 11:28 AM) Colonoscopy within last year  Allergies Levonne Spiller, [place]; 02/08/2017 11:29 AM) Penicillins Allergies Reconciled  Medication History Levonne Spiller, CMA; 02/08/2017 11:30 AM) Escitalopram Oxalate (10MG  Tablet, Oral) Active. Atorvastatin Calcium (20MG  Tablet, Oral) Active. Levothyroxine Sodium (100MCG Tablet, Oral) Active. Myrbetriq (50MG  Tablet ER 24HR, Oral) Active. Ibuprofen (200MG  Tablet, Oral) Active. Medications Reconciled  Social History Andee Poles Education officer, [employer], CMA; 02/08/2017 11:28 AM) Alcohol use Occasional alcohol use. No drug use Tobacco use Never smoker.  Family History Levonne Spiller, CMA; 02/08/2017 11:28 AM) Heart disease in female family member before age 36 Kidney Disease Mother.  Pregnancy / Birth History Levonne Spiller, CMA; 02/08/2017 11:28 AM) Age at menarche 21 years. Age of menopause 32-60 Gravida 2 Length (months) of breastfeeding 7-12 Maternal age 12-25 Para 2  Other Problems Levonne Spiller, [place]; 02/08/2017 11:28 AM) Bladder Problems Hemorrhoids Hypercholesterolemia     Review of Systems Andee Poles Gerrigner CMA; 02/08/2017 11:28 AM) General Not Present- Appetite Loss, Chills, Fatigue, Fever, Night Sweats, Weight Gain and Weight Loss. Skin Not Present- Change in Wart/Mole, Dryness, Hives, Jaundice, New Lesions, Non-Healing Wounds, Rash and Ulcer. Respiratory Not Present- Bloody sputum, Chronic Cough, Difficulty Breathing, Snoring and Wheezing. Gastrointestinal Not Present- Abdominal Pain, Bloating, Bloody Stool,  Change in Bowel Habits, Chronic diarrhea, Constipation, Difficulty Swallowing, Excessive gas, Gets full quickly at meals, Hemorrhoids, Indigestion, Nausea, Rectal Pain and Vomiting. Female Genitourinary Present- Nocturia. Not Present- Frequency, Painful Urination, Pelvic Pain and Urgency. Musculoskeletal Not Present- Back Pain, Joint Pain, Joint Stiffness, Muscle Pain, Muscle Weakness and Swelling of Extremities. Psychiatric Not Present- Anxiety, Bipolar, Change in Sleep Pattern, Depression, Fearful and Frequent crying. Endocrine Not Present- Cold Intolerance, Excessive Hunger, Hair Changes, Heat Intolerance, Hot flashes and New Diabetes. Hematology Not Present- Blood Thinners, Easy Bruising, Excessive bleeding, Gland problems, HIV and Persistent Infections.  Vitals Andee Poles Gerrigner CMA; 02/08/2017 11:30 AM) 02/08/2017 11:30 AM Weight: 171.13 lb Height: 63in Body Surface Area: 1.81 m Body Mass Index: 30.31 kg/m  Temp.: 98.45F(Oral)  Pulse: 64 (Regular)  BP: 108/80 (Sitting, Left Arm, Standard)      Physical Exam Rodman Key K. Peyson Postema MD; 02/08/2017 12:47 PM)  The physical exam findings are as follows: Note:WDWN in NAD Eyes: Pupils equal, round; sclera anicteric HENT: Oral mucosa moist; good dentition Neck: No masses palpated, no thyromegaly Lungs: CTA bilaterally; normal respiratory effort Breasts: symmetric; no nipple retraction or discharge, no axillary lymphadenopathy; no palpable masses CV: Regular rate and rhythm; no murmurs; extremities well-perfused with no edema Abd: +bowel sounds, soft, non-tender, no palpable organomegaly; no palpable hernias Skin: Warm, dry; no sign of jaundice Psychiatric - alert and oriented x 4; calm mood and affect    Assessment & Plan Rodman Key K. 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  Current Plans Schedule for Surgery - Left radioactive seed localized lumpectomy/ left sentinel lymph node biopsy. The surgical procedure has been discussed with the patient. Potential risks, benefits, alternative treatments, and expected outcomes have been explained. All of the patient's questions at this time have been answered. The likelihood of  reaching the patient's treatment goal is good. The patient understand the proposed surgical procedure and wishes to proceed.  Breanna Hill. Georgette Dover, MD, [facility]  General/ Trauma Surgery  02/08/2017 12:48 PM

## 2017-02-13 ENCOUNTER — Other Ambulatory Visit: Payer: Self-pay | Admitting: Surgery

## 2017-02-13 DIAGNOSIS — C50912 Malignant neoplasm of unspecified site of left female breast: Secondary | ICD-10-CM

## 2017-02-20 NOTE — Pre-Procedure Instructions (Signed)
[place]  02/20/2017      [place], [place] [facility] [address] [place] [address] Phone: [phone] Fax: [phone]  [id number] - [place], [place] - [address] AT [facility] [place] [address] Phone: [phone] Fax: [phone]    Your procedure is scheduled on February 27  Report to [place] at [facility] A.M.  Call this number if you have problems the morning of surgery:  [phone]   Remember:  Do not eat food or drink liquids after midnight.   Take these medicines the morning of surgery with A SIP OF WATER  escitalopram (LEXAPRO)  levothyroxine (SYNTHROID, LEVOTHROID) MYRBETRIQ  7 days prior to surgery STOP taking any Aspirin(unless otherwise instructed by your surgeon), Aleve, Naproxen, Ibuprofen, Motrin, Advil, Goody's, BC's, all herbal medications, fish oil, and all vitamins    Do not wear jewelry, make-up or nail polish.  Do not wear lotions, powders, or perfumes, or deodorant.  Do not shave 48 hours prior to surgery.    Do not bring valuables to the hospital.  [facility] is not responsible for any belongings or valuables.  Contacts, dentures or bridgework may not be worn into surgery.  Leave your suitcase in the car.  After surgery it may be brought to your room.  For patients admitted to the hospital, discharge time will be determined by your treatment team.  Patients discharged the day of surgery will not be allowed to drive home.    Special instructions:   [place]- Preparing For Surgery  Before surgery, you can play an important role. Because skin is not sterile, your skin needs to be as free of germs as possible. You can reduce the number of germs on your skin by washing with CHG (chlorahexidine gluconate) Soap before surgery.  CHG is an antiseptic cleaner which kills germs and bonds with the skin to continue killing germs  even after washing.  Please do not use if you have an allergy to CHG or antibacterial soaps. If your skin becomes reddened/irritated stop using the CHG.  Do not shave (including legs and underarms) for at least 48 hours prior to first CHG shower. It is OK to shave your face.  Please follow these instructions carefully.   1. Shower the NIGHT BEFORE SURGERY and the MORNING OF SURGERY with CHG.   2. If you chose to wash your hair, wash your hair first as usual with your normal shampoo.  3. After you shampoo, rinse your hair and body thoroughly to remove the shampoo.  4. Use CHG as you would any other liquid soap. You can apply CHG directly to the skin and wash gently with a scrungie or a clean washcloth.   5. Apply the CHG Soap to your body ONLY FROM THE NECK DOWN.  Do not use on open wounds or open sores. Avoid contact with your eyes, ears, mouth and genitals (private parts). Wash Face and genitals (private parts)  with your normal soap.  6. Wash thoroughly, paying special attention to the area where your surgery will be performed.  7. Thoroughly rinse your body with warm water from the neck down.  8. DO NOT shower/wash with your normal soap after using and rinsing off the CHG Soap.  9. Pat yourself dry with a CLEAN TOWEL.  10. Wear CLEAN PAJAMAS to bed the night before surgery, wear comfortable clothes the morning of  surgery  11. Place CLEAN SHEETS on your bed the night of your first shower and DO NOT SLEEP WITH PETS.    Day of Surgery: Do not apply any deodorants/lotions. Please wear clean clothes to the hospital/surgery center.      Please read over the following fact sheets that you were given.

## 2017-02-21 ENCOUNTER — Encounter (HOSPITAL_COMMUNITY): Payer: Self-pay

## 2017-02-21 ENCOUNTER — Encounter (HOSPITAL_COMMUNITY)
Admission: RE | Admit: 2017-02-21 | Discharge: 2017-02-21 | Disposition: A | Discharge status: Home / Self Care | Payer: Medicare HMO | Source: Ambulatory Visit | Attending: Surgery | Admitting: Surgery

## 2017-02-21 ENCOUNTER — Other Ambulatory Visit: Payer: Self-pay

## 2017-02-21 DIAGNOSIS — C50212 Malignant neoplasm of upper-inner quadrant of left female breast: Secondary | ICD-10-CM | POA: Diagnosis not present

## 2017-02-21 DIAGNOSIS — Z79890 Hormone replacement therapy (postmenopausal): Secondary | ICD-10-CM | POA: Diagnosis not present

## 2017-02-21 DIAGNOSIS — Z880 Allergy status to penicillin: Secondary | ICD-10-CM | POA: Insufficient documentation

## 2017-02-21 DIAGNOSIS — Z01812 Encounter for preprocedural laboratory examination: Secondary | ICD-10-CM | POA: Diagnosis present

## 2017-02-21 DIAGNOSIS — Z79899 Other long term (current) drug therapy: Secondary | ICD-10-CM | POA: Insufficient documentation

## 2017-02-21 DIAGNOSIS — E7800 Pure hypercholesterolemia, unspecified: Secondary | ICD-10-CM | POA: Insufficient documentation

## 2017-02-21 HISTORY — DX: Prediabetes: R73.03

## 2017-02-21 HISTORY — DX: Unspecified osteoarthritis, unspecified site: M19.90

## 2017-02-21 HISTORY — DX: Malignant (primary) neoplasm, unspecified: C80.1

## 2017-02-21 LAB — CBC
HCT: 43.4 % (ref 36.0–46.0)
HEMOGLOBIN: 14.5 g/dL (ref 12.0–15.0)
MCH: 31.3 pg (ref 26.0–34.0)
MCHC: 33.4 g/dL (ref 30.0–36.0)
MCV: 93.5 fL (ref 78.0–100.0)
PLATELETS: 231 10*3/uL (ref 150–400)
RBC: 4.64 MIL/uL (ref 3.87–5.11)
RDW: 12.9 % (ref 11.5–15.5)
WBC: 7.8 10*3/uL (ref 4.0–10.5)

## 2017-02-21 LAB — BASIC METABOLIC PANEL
Anion gap: 10 (ref 5–15)
BUN: 18 mg/dL (ref 6–20)
CO2: 26 mmol/L (ref 22–32)
Calcium: 9.1 mg/dL (ref 8.9–10.3)
Chloride: 102 mmol/L (ref 101–111)
Creatinine, Ser: 0.87 mg/dL (ref 0.44–1.00)
GFR calc Af Amer: >60 mL/min (ref >60)
GFR calc non Af Amer: >60 mL/min (ref >60)
Glucose, Bld: 134 mg/dL — ABNORMAL HIGH (ref 65–99)
Potassium: 4 mmol/L (ref 3.5–5.1)
Sodium: 138 mmol/L (ref 135–145)

## 2017-02-21 LAB — GLUCOSE, CAPILLARY: Glucose-Capillary: 118 mg/dL — ABNORMAL HIGH (ref 65–99)

## 2017-02-21 NOTE — Progress Notes (Signed)
Pt. Reports that she presented to [facility] ED for chest pain 3 yrs. Ago, EKG was done for c/o chest pain.  She was monitored for a while & then sent home with no further f/u needed & told that it was a muscle pull.

## 2017-02-21 NOTE — Progress Notes (Signed)
Pt. Stopping aspirin as of today 02/21/2017

## 2017-02-21 NOTE — Progress Notes (Signed)
PCP- Dr. Salena Saner. Pt. Agrees to use of presurgery drink before she leaves the house in the a.m. of her surgery.

## 2017-02-26 ENCOUNTER — Ambulatory Visit
Admission: RE | Admit: 2017-02-26 | Discharge: 2017-02-26 | Disposition: A | Discharge status: Home / Self Care | Payer: Medicare HMO | Source: Ambulatory Visit | Attending: Surgery | Admitting: Surgery

## 2017-02-26 DIAGNOSIS — C50912 Malignant neoplasm of unspecified site of left female breast: Secondary | ICD-10-CM

## 2017-02-26 DIAGNOSIS — R928 Other abnormal and inconclusive findings on diagnostic imaging of breast: Secondary | ICD-10-CM | POA: Diagnosis not present

## 2017-02-27 ENCOUNTER — Ambulatory Visit (HOSPITAL_COMMUNITY): Payer: Medicare HMO | Admitting: Certified Registered"

## 2017-02-27 ENCOUNTER — Ambulatory Visit
Admission: RE | Admit: 2017-02-27 | Discharge: 2017-02-27 | Disposition: A | Discharge status: Home / Self Care | Payer: Medicare HMO | Source: Ambulatory Visit | Attending: Surgery | Admitting: Surgery

## 2017-02-27 ENCOUNTER — Ambulatory Visit (HOSPITAL_COMMUNITY)
Admission: RE | Admit: 2017-02-27 | Discharge: 2017-02-27 | Disposition: A | Discharge status: Home / Self Care | Payer: Medicare HMO | Source: Ambulatory Visit | Attending: Surgery | Admitting: Surgery

## 2017-02-27 ENCOUNTER — Encounter (HOSPITAL_COMMUNITY)
Admission: RE | Disposition: A | Discharge status: Home / Self Care | Payer: Self-pay | Source: Ambulatory Visit | Attending: Surgery

## 2017-02-27 ENCOUNTER — Encounter (HOSPITAL_COMMUNITY): Payer: Self-pay | Admitting: Certified Registered"

## 2017-02-27 DIAGNOSIS — E7800 Pure hypercholesterolemia, unspecified: Secondary | ICD-10-CM | POA: Insufficient documentation

## 2017-02-27 DIAGNOSIS — G8918 Other acute postprocedural pain: Secondary | ICD-10-CM | POA: Diagnosis not present

## 2017-02-27 DIAGNOSIS — C50212 Malignant neoplasm of upper-inner quadrant of left female breast: Secondary | ICD-10-CM | POA: Diagnosis not present

## 2017-02-27 DIAGNOSIS — C50912 Malignant neoplasm of unspecified site of left female breast: Secondary | ICD-10-CM

## 2017-02-27 DIAGNOSIS — Z79899 Other long term (current) drug therapy: Secondary | ICD-10-CM | POA: Diagnosis not present

## 2017-02-27 DIAGNOSIS — R928 Other abnormal and inconclusive findings on diagnostic imaging of breast: Secondary | ICD-10-CM | POA: Diagnosis not present

## 2017-02-27 DIAGNOSIS — Z170 Estrogen receptor positive status [ER+]: Secondary | ICD-10-CM | POA: Diagnosis not present

## 2017-02-27 DIAGNOSIS — M24519 Contracture, unspecified shoulder: Secondary | ICD-10-CM | POA: Diagnosis not present

## 2017-02-27 DIAGNOSIS — K219 Gastro-esophageal reflux disease without esophagitis: Secondary | ICD-10-CM | POA: Insufficient documentation

## 2017-02-27 DIAGNOSIS — M19019 Primary osteoarthritis, unspecified shoulder: Secondary | ICD-10-CM | POA: Diagnosis not present

## 2017-02-27 HISTORY — PX: BREAST LUMPECTOMY WITH RADIOACTIVE SEED AND SENTINEL LYMPH NODE BIOPSY: SHX6550

## 2017-02-27 SURGERY — BREAST LUMPECTOMY WITH RADIOACTIVE SEED AND SENTINEL LYMPH NODE BIOPSY
Anesthesia: Regional | Site: Breast | Laterality: Left

## 2017-02-27 MED ORDER — HYDROCODONE-ACETAMINOPHEN 5-325 MG PO TABS
1.0000 | ORAL_TABLET | Freq: Four times a day (QID) | ORAL | Status: DC | PRN
  Administered 2017-02-27: 1 via ORAL

## 2017-02-27 MED ORDER — EPHEDRINE SULFATE-NACL 50-0.9 MG/10ML-% IV SOSY
PREFILLED_SYRINGE | INTRAVENOUS | Status: DC | PRN
  Administered 2017-02-27 (×2): 5 mg via INTRAVENOUS
  Administered 2017-02-27: 10 mg via INTRAVENOUS

## 2017-02-27 MED ORDER — HYDROCODONE-ACETAMINOPHEN 5-325 MG PO TABS: 1.0000 | ORAL_TABLET | Freq: Four times a day (QID) | ORAL | 0 refills | Status: DC | PRN

## 2017-02-27 MED ORDER — ACETAMINOPHEN 500 MG PO TABS
ORAL_TABLET | ORAL | Status: AC
  Administered 2017-02-27: 1000 mg via ORAL
  Filled 2017-02-27: qty 2

## 2017-02-27 MED ORDER — GABAPENTIN 300 MG PO CAPS
ORAL_CAPSULE | ORAL | Status: AC
  Administered 2017-02-27: 300 mg via ORAL
  Filled 2017-02-27: qty 1

## 2017-02-27 MED ORDER — FENTANYL CITRATE (PF) 100 MCG/2ML IJ SOLN
100.0000 ug | Freq: Once | INTRAMUSCULAR | Status: AC
  Administered 2017-02-27: 100 ug via INTRAVENOUS

## 2017-02-27 MED ORDER — LACTATED RINGERS IV SOLN
INTRAVENOUS | Status: DC | PRN
  Administered 2017-02-27: 11:00:00 via INTRAVENOUS

## 2017-02-27 MED ORDER — SODIUM CHLORIDE 0.9 % IJ SOLN
INTRAMUSCULAR | Status: AC
  Filled 2017-02-27: qty 10

## 2017-02-27 MED ORDER — PROPOFOL 10 MG/ML IV BOLUS
INTRAVENOUS | Status: DC | PRN
  Administered 2017-02-27: 100 mg via INTRAVENOUS

## 2017-02-27 MED ORDER — SODIUM CHLORIDE 0.9 % IJ SOLN
INTRAVENOUS | Status: DC | PRN
  Administered 2017-02-27: 5 mL

## 2017-02-27 MED ORDER — CHLORHEXIDINE GLUCONATE CLOTH 2 % EX PADS: 6.0000 | MEDICATED_PAD | Freq: Once | CUTANEOUS | Status: DC

## 2017-02-27 MED ORDER — CEFAZOLIN SODIUM-DEXTROSE 2-4 GM/100ML-% IV SOLN
INTRAVENOUS | Status: AC
  Filled 2017-02-27: qty 100

## 2017-02-27 MED ORDER — LIDOCAINE 2% (20 MG/ML) 5 ML SYRINGE
INTRAMUSCULAR | Status: AC
  Filled 2017-02-27: qty 5

## 2017-02-27 MED ORDER — PHENYLEPHRINE 40 MCG/ML (10ML) SYRINGE FOR IV PUSH (FOR BLOOD PRESSURE SUPPORT)
PREFILLED_SYRINGE | INTRAVENOUS | Status: AC
  Filled 2017-02-27: qty 20

## 2017-02-27 MED ORDER — ONDANSETRON HCL 4 MG/2ML IJ SOLN
INTRAMUSCULAR | Status: AC
  Filled 2017-02-27: qty 4

## 2017-02-27 MED ORDER — FENTANYL CITRATE (PF) 250 MCG/5ML IJ SOLN
INTRAMUSCULAR | Status: AC
  Filled 2017-02-27: qty 5

## 2017-02-27 MED ORDER — TECHNETIUM TC 99M SULFUR COLLOID FILTERED
1.0000 | Freq: Once | INTRAVENOUS | Status: AC | PRN
  Administered 2017-02-27: 1 via INTRADERMAL

## 2017-02-27 MED ORDER — MIDAZOLAM HCL 2 MG/2ML IJ SOLN
INTRAMUSCULAR | Status: AC
  Administered 2017-02-27: 2 mg via INTRAVENOUS
  Filled 2017-02-27: qty 2

## 2017-02-27 MED ORDER — HYDROCODONE-ACETAMINOPHEN 5-325 MG PO TABS
ORAL_TABLET | ORAL | Status: AC
  Filled 2017-02-27: qty 1

## 2017-02-27 MED ORDER — ONDANSETRON HCL 4 MG/2ML IJ SOLN
INTRAMUSCULAR | Status: DC | PRN
  Administered 2017-02-27: 4 mg via INTRAVENOUS

## 2017-02-27 MED ORDER — GABAPENTIN 300 MG PO CAPS
300.0000 mg | ORAL_CAPSULE | ORAL | Status: AC
  Administered 2017-02-27: 300 mg via ORAL

## 2017-02-27 MED ORDER — DEXAMETHASONE SODIUM PHOSPHATE 10 MG/ML IJ SOLN
INTRAMUSCULAR | Status: DC | PRN
  Administered 2017-02-27: 10 mg via INTRAVENOUS

## 2017-02-27 MED ORDER — MIDAZOLAM HCL 2 MG/2ML IJ SOLN
2.0000 mg | Freq: Once | INTRAMUSCULAR | Status: AC
  Administered 2017-02-27: 2 mg via INTRAVENOUS

## 2017-02-27 MED ORDER — 0.9 % SODIUM CHLORIDE (POUR BTL) OPTIME
TOPICAL | Status: DC | PRN
  Administered 2017-02-27: 1000 mL

## 2017-02-27 MED ORDER — BUPIVACAINE-EPINEPHRINE 0.5% -1:200000 IJ SOLN
INTRAMUSCULAR | Status: DC | PRN
  Administered 2017-02-27: 18 mL

## 2017-02-27 MED ORDER — METHYLENE BLUE 0.5 % INJ SOLN
INTRAVENOUS | Status: AC
  Filled 2017-02-27: qty 10

## 2017-02-27 MED ORDER — SUCCINYLCHOLINE CHLORIDE 200 MG/10ML IV SOSY
PREFILLED_SYRINGE | INTRAVENOUS | Status: AC
  Filled 2017-02-27: qty 10

## 2017-02-27 MED ORDER — ROCURONIUM BROMIDE 50 MG/5ML IV SOLN
INTRAVENOUS | Status: AC
  Filled 2017-02-27: qty 3

## 2017-02-27 MED ORDER — BUPIVACAINE-EPINEPHRINE (PF) 0.5% -1:200000 IJ SOLN
INTRAMUSCULAR | Status: AC
  Filled 2017-02-27: qty 30

## 2017-02-27 MED ORDER — FENTANYL CITRATE (PF) 250 MCG/5ML IJ SOLN
INTRAMUSCULAR | Status: DC | PRN
  Administered 2017-02-27: 25 ug via INTRAVENOUS

## 2017-02-27 MED ORDER — CEFAZOLIN SODIUM-DEXTROSE 2-4 GM/100ML-% IV SOLN
2.0000 g | INTRAVENOUS | Status: AC
  Administered 2017-02-27: 2 g via INTRAVENOUS

## 2017-02-27 MED ORDER — ACETAMINOPHEN 500 MG PO TABS
1000.0000 mg | ORAL_TABLET | ORAL | Status: AC
  Administered 2017-02-27: 1000 mg via ORAL

## 2017-02-27 MED ORDER — FENTANYL CITRATE (PF) 100 MCG/2ML IJ SOLN
INTRAMUSCULAR | Status: AC
  Administered 2017-02-27: 100 ug via INTRAVENOUS
  Filled 2017-02-27: qty 2

## 2017-02-27 MED ORDER — DEXAMETHASONE SODIUM PHOSPHATE 10 MG/ML IJ SOLN
INTRAMUSCULAR | Status: AC
  Filled 2017-02-27: qty 2

## 2017-02-27 MED ORDER — EPHEDRINE 5 MG/ML INJ
INTRAVENOUS | Status: AC
  Filled 2017-02-27: qty 30

## 2017-02-27 MED ORDER — ROPIVACAINE HCL 7.5 MG/ML IJ SOLN
INTRAMUSCULAR | Status: DC | PRN
  Administered 2017-02-27: 25 mL via PERINEURAL

## 2017-02-27 SURGICAL SUPPLY — 52 items
APL SKNCLS STERI-STRIP NONHPOA (GAUZE/BANDAGES/DRESSINGS) ×1
APPLIER CLIP 9.375 MED OPEN (MISCELLANEOUS) ×2
APR CLP MED 9.3 20 MLT OPN (MISCELLANEOUS) ×1
BENZOIN TINCTURE PRP APPL 2/3 (GAUZE/BANDAGES/DRESSINGS) ×2 IMPLANT
BINDER BREAST LRG (GAUZE/BANDAGES/DRESSINGS) IMPLANT
BINDER BREAST XLRG (GAUZE/BANDAGES/DRESSINGS) ×2 IMPLANT
BLADE SURG 15 STRL LF DISP TIS (BLADE) ×1 IMPLANT
BLADE SURG 15 STRL SS (BLADE) ×2
CANISTER SUCT 3000ML PPV (MISCELLANEOUS) ×2 IMPLANT
CHLORAPREP W/TINT 26ML (MISCELLANEOUS) ×2 IMPLANT
CLIP APPLIE 9.375 MED OPEN (MISCELLANEOUS) ×1 IMPLANT
CONT SPEC 4OZ CLIKSEAL STRL BL (MISCELLANEOUS) ×2 IMPLANT
COVER PROBE W GEL 5X96 (DRAPES) ×2 IMPLANT
COVER SURGICAL LIGHT HANDLE (MISCELLANEOUS) ×2 IMPLANT
DEVICE DUBIN SPECIMEN MAMMOGRA (MISCELLANEOUS) ×2 IMPLANT
DRAPE CHEST BREAST 15X10 FENES (DRAPES) ×2 IMPLANT
DRAPE UTILITY XL STRL (DRAPES) ×2 IMPLANT
DRSG TEGADERM 4X4.75 (GAUZE/BANDAGES/DRESSINGS) ×4 IMPLANT
ELECT CAUTERY BLADE 6.4 (BLADE) ×2 IMPLANT
ELECT REM PT RETURN 9FT ADLT (ELECTROSURGICAL) ×2
ELECTRODE REM PT RTRN 9FT ADLT (ELECTROSURGICAL) ×1 IMPLANT
GAUZE SPONGE 2X2 8PLY STRL LF (GAUZE/BANDAGES/DRESSINGS) ×1 IMPLANT
GLOVE BIO SURGEON STRL SZ7 (GLOVE) ×4 IMPLANT
GLOVE BIOGEL PI IND STRL 7.5 (GLOVE) ×1 IMPLANT
GLOVE BIOGEL PI INDICATOR 7.5 (GLOVE) ×1
GOWN STRL REUS W/ TWL LRG LVL3 (GOWN DISPOSABLE) ×2 IMPLANT
GOWN STRL REUS W/TWL LRG LVL3 (GOWN DISPOSABLE) ×4
KIT BASIN OR (CUSTOM PROCEDURE TRAY) ×2 IMPLANT
KIT MARKER MARGIN INK (KITS) ×2 IMPLANT
LIGHT WAVEGUIDE WIDE FLAT (MISCELLANEOUS) IMPLANT
NDL FILTER BLUNT 18X1 1/2 (NEEDLE) ×1 IMPLANT
NDL HYPO 25GX1X1/2 BEV (NEEDLE) ×2 IMPLANT
NDL SAFETY ECLIPSE 18X1.5 (NEEDLE) ×1 IMPLANT
NEEDLE FILTER BLUNT 18X 1/2SAF (NEEDLE) ×1
NEEDLE FILTER BLUNT 18X1 1/2 (NEEDLE) ×1 IMPLANT
NEEDLE HYPO 18GX1.5 SHARP (NEEDLE) ×2
NEEDLE HYPO 25GX1X1/2 BEV (NEEDLE) ×4 IMPLANT
NS IRRIG 1000ML POUR BTL (IV SOLUTION) ×2 IMPLANT
PACK SURGICAL SETUP 50X90 (CUSTOM PROCEDURE TRAY) ×2 IMPLANT
PENCIL BUTTON HOLSTER BLD 10FT (ELECTRODE) ×2 IMPLANT
SPONGE GAUZE 2X2 STER 10/PKG (GAUZE/BANDAGES/DRESSINGS) ×1
SPONGE LAP 4X18 X RAY DECT (DISPOSABLE) ×2 IMPLANT
STRIP CLOSURE SKIN 1/2X4 (GAUZE/BANDAGES/DRESSINGS) ×2 IMPLANT
SUT MNCRL AB 4-0 PS2 18 (SUTURE) ×4 IMPLANT
SUT VIC AB 3-0 SH 27 (SUTURE) ×4
SUT VIC AB 3-0 SH 27X BRD (SUTURE) ×2 IMPLANT
SYR BULB 3OZ (MISCELLANEOUS) ×2 IMPLANT
SYR CONTROL 10ML LL (SYRINGE) ×4 IMPLANT
TOWEL OR 17X24 6PK STRL BLUE (TOWEL DISPOSABLE) ×2 IMPLANT
TOWEL OR 17X26 10 PK STRL BLUE (TOWEL DISPOSABLE) ×2 IMPLANT
TUBE CONNECTING 12X1/4 (SUCTIONS) ×2 IMPLANT
YANKAUER SUCT BULB TIP NO VENT (SUCTIONS) ×2 IMPLANT

## 2017-02-27 NOTE — Transfer of Care (Signed)
Immediate Anesthesia Transfer of Care Note  Patient: Breanna Hill  Procedure(s) Performed: LEFT BREAST LUMPECTOMY WITH RADIOACTIVE SEED AND LEFT AXILLARY SENTINEL LYMPH NODE BIOPSY (Left Breast)  Patient Location: PACU  Anesthesia Type:GA combined with regional for post-op pain  Level of Consciousness: drowsy  Airway & Oxygen Therapy: Patient Spontanous Breathing and Patient connected to nasal cannula oxygen  Post-op Assessment: Report given to RN and Post -op Vital signs reviewed and stable  Post vital signs: Reviewed and stable  Last Vitals:  Vitals:   02/27/17 0850 02/27/17 1220  BP: (!) 158/74 (!) 149/60  Pulse: (!) 58 66  Resp: 18 13  Temp: 36.7 C (!) 36.3 C  SpO2: 98% 100%    Last Pain:  Vitals:   02/27/17 0850  TempSrc: Oral      Patients Stated Pain Goal: 2 (00/37/04 8889)  Complications: No apparent anesthesia complications

## 2017-02-27 NOTE — Anesthesia Postprocedure Evaluation (Signed)
Anesthesia Post Note  Patient: Maddisyn Hegwood Bulger  Procedure(s) Performed: LEFT BREAST LUMPECTOMY WITH RADIOACTIVE SEED AND LEFT AXILLARY SENTINEL LYMPH NODE BIOPSY (Left Breast)     Patient location during evaluation: PACU Anesthesia Type: General and Regional Level of consciousness: awake and alert Pain management: pain level controlled Vital Signs Assessment: post-procedure vital signs reviewed and stable Respiratory status: spontaneous breathing, nonlabored ventilation, respiratory function stable and patient connected to nasal cannula oxygen Cardiovascular status: blood pressure returned to baseline and stable Postop Assessment: no apparent nausea or vomiting Anesthetic complications: no    Last Vitals:  Vitals:   02/27/17 1314 02/27/17 1329  BP:  (!) 155/62  Pulse:  91  Resp:    Temp: (!) 36.1 C (!) 36.1 C  SpO2:  98%    Last Pain:  Vitals:   02/27/17 1342  TempSrc:   PainSc: 8                  Janita Camberos

## 2017-02-27 NOTE — Anesthesia Procedure Notes (Signed)
Anesthesia Regional Block: Pectoralis block   Pre-Anesthetic Checklist: ,, timeout performed, Correct Patient, Correct Site, Correct Laterality, Correct Procedure, Correct Position, site marked, Risks and benefits discussed,  Surgical consent,  Pre-op evaluation,  At surgeon's request and post-op pain management  Laterality: Left  Prep: chloraprep       Needles:  Injection technique: Single-shot  Needle Type: Echogenic Stimulator Needle          Additional Needles:   Procedures:,,,, ultrasound used (permanent image in chart),,,,  Narrative:  Start time: 02/27/2017 10:22 AM End time: 02/27/2017 10:26 AM Injection made incrementally with aspirations every 5 mL.  Performed by: Personally  Anesthesiologist: Oleta Mouse, MD  Additional Notes: H+P and labs reviewed, risks and benefits discussed with patient, procedure tolerated well without complications

## 2017-02-27 NOTE — Anesthesia Procedure Notes (Signed)
Procedure Name: LMA Insertion Date/Time: 02/27/2017 10:39 AM Performed by: Imagene Riches, CRNA Pre-anesthesia Checklist: Patient identified, Emergency Drugs available, Suction available and Patient being monitored Patient Re-evaluated:Patient Re-evaluated prior to induction Oxygen Delivery Method: Circle System Utilized Preoxygenation: Pre-oxygenation with 100% oxygen Induction Type: IV induction Ventilation: Mask ventilation without difficulty LMA: LMA inserted LMA Size: 4.0 Number of attempts: 1 Airway Equipment and Method: Bite block Placement Confirmation: positive ETCO2 Tube secured with: Tape Dental Injury: Teeth and Oropharynx as per pre-operative assessment

## 2017-02-27 NOTE — Anesthesia Preprocedure Evaluation (Signed)
Anesthesia Evaluation  Patient identified by MRN, date of birth, ID band Patient awake    Reviewed: Allergy & Precautions, NPO status , Patient's Chart, lab work & pertinent test results  History of Anesthesia Complications Negative for: history of anesthetic complications  Airway Mallampati: III  TM Distance: <3 FB Neck ROM: Full    Dental  (+) Teeth Intact   Pulmonary neg pulmonary ROS,    breath sounds clear to auscultation       Cardiovascular negative cardio ROS   Rhythm:Regular     Neuro/Psych PSYCHIATRIC DISORDERS Depression negative neurological ROS     GI/Hepatic Neg liver ROS, GERD  Controlled,  Endo/Other  Hypothyroidism   Renal/GU negative Renal ROS     Musculoskeletal  (+) Arthritis ,   Abdominal   Peds  Hematology negative hematology ROS (+)   Anesthesia Other Findings   Reproductive/Obstetrics                             Anesthesia Physical Anesthesia Plan  ASA: II  Anesthesia Plan: General and Regional   Post-op Pain Management:    Induction: Intravenous  PONV Risk Score and Plan: 3 and Ondansetron and Dexamethasone  Airway Management Planned: LMA  Additional Equipment: None  Intra-op Plan:   Post-operative Plan: Extubation in OR  Informed Consent: I have reviewed the patients History and Physical, chart, labs and discussed the procedure including the risks, benefits and alternatives for the proposed anesthesia with the patient or authorized representative who has indicated his/her understanding and acceptance.   Dental advisory given  Plan Discussed with: CRNA and Surgeon  Anesthesia Plan Comments:         Anesthesia Quick Evaluation

## 2017-02-27 NOTE — Interval H&P Note (Signed)
History and Physical Interval Note:  02/27/2017 9:57 AM  Breanna Hill  has presented today for surgery, with the diagnosis of LEFT INVASIVE DUCTAL CARCINOMA  The various methods of treatment have been discussed with the patient and family. After consideration of risks, benefits and other options for treatment, the patient has consented to  Procedure(s): LEFT BREAST LUMPECTOMY WITH RADIOACTIVE SEED AND LEFT AXILLARY SENTINEL LYMPH NODE BIOPSY (Left) as a surgical intervention .  The patient's history has been reviewed, patient examined, no change in status, stable for surgery.  I have reviewed the patient's chart and labs.  Questions were answered to the patient's satisfaction.     Maia Petties

## 2017-02-27 NOTE — Op Note (Signed)
Pre-op Diagnosis:  Left invasive ductal carcinoma Post-op Diagnosis: same Procedure:  Left radioactive seed localized lumpectomy, left axillary sentinel lymph node biopsy, blue dye injection Surgeon:  Maia Petties. Anesthesia:  GEN - LMA/ pectoralis block Indications:  This is a 77 year old female in good health who presents with recent diagnosis of left breast cancer. She had a routine screening mammogram that showed some suspicious findings in the left breast. She was found to have 2 subcentimeter adjacent nodules suspicious for breast cancer. The larger lesion was biopsied and revealed invasive ductal carcinoma grade 1. Prognostic panel ER/ PR +, Ki67 1%. Unfortunately the biopsy clip migrated laterally for a distance of about 3 cm. However the nodules are still visible on mammogram.   Description of procedure: In the preoperative area, the patient had been injected with technetium sulfur colloid by radiology.  Radioactive seed had been placed yesterday by radiology.  The patient is brought to the operating room placed in supine position on the operating room table.  A timeout was taken to ensure the proper patient and proper procedure. After an adequate level of general anesthesia was obtained, I cleaned the skin around her left nipple and injected methylene blue dye solution intradermally.  Her left breast was prepped with ChloraPrep and draped in sterile fashion.  We interrogated the breast with the neoprobe. We made a circumareolar incision around the upper side of the nipple after infiltrating with 0.25% Marcaine. Dissection was carried down in the breast tissue with cautery. We used the neoprobe to guide [place] towards the radioactive seed. We excised an area of tissue around the radioactive seed 3 centimeters in diameter. The specimen was removed and was oriented with a paint kit. Specimen mammogram showed the radioactive seed as well as the biopsy clip within the specimen. This was sent for  pathologic examination. There is no residual radioactivity within the biopsy cavity. We inspected carefully for hemostasis. The wound was thoroughly irrigated.   We change the settings on the neoprobe.  I then interrogated the left axilla.  We identified an area of increased activity.  I made a transverse incision across the axilla.  We dissected down into the axillary contents with cautery.  Using the neoprobe we identified a sentinel lymph node.  This was hot and blue.  Ex vivo counts were over 2000.  And then interrogated the axilla again.  There is a second lymph node that was identified.  This was also excised.  This did not seem to contain any blue dye.  There was minimal background activity.  We inspect for hemostasis.  We irrigated the wound thoroughly.  Both wounds were closed with a deep layer of 3-0 Vicryl and a subcuticular layer of 4-0 Monocryl. Benzoin and Steri-Strips were applied. The patient was then extubated and brought to the recovery room in stable condition. All sponge, instrument, and needle counts are correct.  Imogene Burn. Georgette Dover, MD, [facility]  General/ Trauma Surgery  02/27/2017 12:13 PM

## 2017-02-27 NOTE — Interval H&P Note (Signed)
History and Physical Interval Note:  02/27/2017 9:44 AM  Breanna Hill  has presented today for surgery, with the diagnosis of LEFT INVASIVE DUCTAL CARCINOMA  The various methods of treatment have been discussed with the patient and family. After consideration of risks, benefits and other options for treatment, the patient has consented to  Procedure(s): LEFT BREAST LUMPECTOMY WITH RADIOACTIVE SEED AND LEFT AXILLARY SENTINEL LYMPH NODE BIOPSY (Left) as a surgical intervention .  The patient's history has been reviewed, patient examined, no change in status, stable for surgery.  I have reviewed the patient's chart and labs.  Questions were answered to the patient's satisfaction.     Maia Petties

## 2017-02-27 NOTE — Discharge Instructions (Signed)
[facility] °Office Phone Number [phone] ° °BREAST BIOPSY/ PARTIAL MASTECTOMY: POST OP INSTRUCTIONS ° °Always review your discharge instruction sheet given to you by the facility where your surgery was performed. ° °IF YOU HAVE DISABILITY OR FAMILY LEAVE FORMS, YOU MUST BRING THEM TO THE OFFICE FOR PROCESSING.  DO NOT GIVE THEM TO YOUR DOCTOR. ° °1. A prescription for pain medication may be given to you upon discharge.  Take your pain medication as prescribed, if needed.  If narcotic pain medicine is not needed, then you may take acetaminophen (Tylenol) or ibuprofen (Advil) as needed. °2. Take your usually prescribed medications unless otherwise directed °3. If you need a refill on your pain medication, please contact your pharmacy.  They will contact our office to request authorization.  Prescriptions will not be filled after 5pm or on week-ends. °4. You should eat very light the first 24 hours after surgery, such as soup, crackers, pudding, etc.  Resume your normal diet the day after surgery. °5. Most patients will experience some swelling and bruising in the breast.  Ice packs and a good support bra will help.  Swelling and bruising can take several days to resolve.  °6. It is common to experience some constipation if taking pain medication after surgery.  Increasing fluid intake and taking a stool softener will usually help or prevent this problem from occurring.  A mild laxative (Milk of Magnesia or Miralax) should be taken according to package directions if there are no bowel movements after 48 hours. °7. Unless discharge instructions indicate otherwise, you may remove your bandages 48 hours after surgery, and you may shower at that time.  You will have steri-strips (small skin tapes) in place directly over the incision.  These strips should be left on the skin for 7-10 days.   Any sutures or staples will be removed at the office during your follow-up visit. °8. ACTIVITIES:  You may resume  regular daily activities (gradually increasing) beginning the next day.  Wearing a good support bra or sports bra minimizes pain and swelling.  You may have sexual intercourse when it is comfortable. °a. You may drive when you no longer are taking prescription pain medication, you can comfortably wear a seatbelt, and you can safely maneuver your car and apply brakes. °b. RETURN TO WORK:  1-2 weeks °9. You should see your doctor in the office for a follow-up appointment approximately two weeks after your surgery.  Your doctor’s nurse will typically make your follow-up appointment when she calls you with your pathology report.  Expect your pathology report 2-3 business days after your surgery.  You may call to check if you do not hear from us after three days. °10. OTHER INSTRUCTIONS: _______________________________________________________________________________________________ _____________________________________________________________________________________________________________________________________ °_____________________________________________________________________________________________________________________________________ °_____________________________________________________________________________________________________________________________________ ° °WHEN TO CALL YOUR DOCTOR: °1. Fever over 101.0 °2. Nausea and/or vomiting. °3. Extreme swelling or bruising. °4. Continued bleeding from incision. °5. Increased pain, redness, or drainage from the incision. ° °The clinic staff is available to answer your questions during regular business hours.  Please don’t hesitate to call and ask to speak to one of the nurses for clinical concerns.  If you have a medical emergency, go to the nearest emergency room or call 911.  A surgeon from [facility] is always on call at the hospital. ° °For further questions, please visit [email or web address]  ° ° °

## 2017-02-28 ENCOUNTER — Encounter (HOSPITAL_COMMUNITY): Payer: Self-pay | Admitting: Surgery

## 2017-03-04 ENCOUNTER — Encounter: Payer: Self-pay | Admitting: Radiation Oncology

## 2017-03-05 ENCOUNTER — Inpatient Hospital Stay: Payer: Medicare HMO | Attending: Hematology and Oncology | Admitting: Hematology and Oncology

## 2017-03-05 DIAGNOSIS — C50212 Malignant neoplasm of upper-inner quadrant of left female breast: Secondary | ICD-10-CM | POA: Diagnosis not present

## 2017-03-05 DIAGNOSIS — Z170 Estrogen receptor positive status [ER+]: Secondary | ICD-10-CM | POA: Diagnosis not present

## 2017-03-05 NOTE — Assessment & Plan Note (Signed)
Left lumpectomy 02/27/2017: IDC grade 1, 1.1 cm, ALH, margins negative, 0/2 lymph nodes negative, ER 90%, PR 10%, HER-2 negative ratio 1.26, Ki-67 1%, T1CN0 stage I a  Pathology counseling: I discussed the final pathology report of the patient provided  a copy of this report. I discussed the margins as well as lymph node surgeries. We also discussed the final staging along with previously performed ER/PR and HER-2/neu testing.  Recommendation: 1. Adjuvant radiation therapy followed by 2. adjuvant antiestrogen therapy   Return to clinic after radiation therapy is complete

## 2017-03-05 NOTE — Progress Notes (Signed)
[facility] CONSULT NOTE  Patient Care Team: Asencion Noble, MD as PCP - General (Internal Medicine)  CHIEF COMPLAINTS/PURPOSE OF CONSULTATION:  Newly diagnosed breast cancer  HISTORY OF PRESENTING ILLNESS:  Breanna Hill 77 y.o. female is here because of recent diagnosis of left breast cancer.  Patient had a screening mammogram that detected 2 masses in the left breast.  She underwent further investigation with ultrasound and biopsy.  This was diagnosis of invasive ductal carcinoma that was ER PR positive and HER-2 negative.  She was taken to operating room to undergo left lumpectomy on 02/25/2017.  She is here today to discuss the surgical pathology report as well as to discuss adjuvant treatment plan.  She has recovered very well from the recent surgery and is here accompanied by her brother  I reviewed her records extensively and collaborated the history with the patient.  MEDICAL HISTORY:  Past Medical History:  Diagnosis Date  . Arthritis    R hand   . Cancer [facility])    breast CA- diagnosed with needle biopsy  . Depression   . GERD (gastroesophageal reflux disease)    rare use of tums   . High cholesterol 10/17/2015  . Hypothyroidism   . Pre-diabetes    Hgba1C- in the 6 's , per pt., she monitors her diet    SURGICAL HISTORY: Past Surgical History:  Procedure Laterality Date  . [place]  . BREAST LUMPECTOMY WITH RADIOACTIVE SEED AND SENTINEL LYMPH NODE BIOPSY Left 02/27/2017   Procedure: LEFT BREAST LUMPECTOMY WITH RADIOACTIVE SEED AND LEFT AXILLARY SENTINEL LYMPH NODE BIOPSY;  Surgeon: Donnie Mesa, MD;  Location: [facility];  Service: General;  Laterality: Left;  . COLONOSCOPY     X 2  . COLONOSCOPY N/A 02/22/2016   Procedure: COLONOSCOPY;  Surgeon: Rogene Houston, MD;  [facility];  Service: Endoscopy;  Laterality: N/A;  1:45  . EYE SURGERY Bilateral    phlebpheroplasty  . Left shoultder surgery     for bone spurs & frozen shoulder   .  Right bunionectomy    . TONSILLECTOMY    . TUBAL LIGATION      SOCIAL HISTORY: Social History   Socioeconomic History  . Marital status: Married    Spouse name: Not on file  . Number of children: Not on file  . Years of education: Not on file  . Highest education level: Not on file  Social Needs  . Financial resource strain: Not on file  . Food insecurity - worry: Not on file  . Food insecurity - inability: Not on file  . Transportation needs - medical: Not on file  . Transportation needs - non-medical: Not on file  Occupational History  . Not on file  Tobacco Use  . Smoking status: Never Smoker  . Smokeless tobacco: Never Used  Substance and Sexual Activity  . Alcohol use: Yes    Comment: occasional  . Drug use: No  . Sexual activity: Not on file  Other Topics Concern  . Not on file  Social History Narrative  . Not on file    FAMILY HISTORY: Family History  Problem Relation Age of Onset  . Colon cancer Neg Hx     ALLERGIES:  is allergic to penicillins.  MEDICATIONS:  Current Outpatient Medications  Medication Sig Dispense Refill  . acetaminophen (TYLENOL) 500 MG tablet Take 500-1,000 mg by mouth every 6 (six) hours as needed (for pain.).    Marland Kitchen aspirin  EC 81 MG tablet Take 81 mg by mouth daily.    Marland Kitchen atorvastatin (LIPITOR) 20 MG tablet Take 20 mg by mouth every evening.     . escitalopram (LEXAPRO) 10 MG tablet Take 5 mg by mouth daily before breakfast.     . ferrous sulfate 325 (65 FE) MG tablet Take 325 mg by mouth every evening.    Marland Kitchen levothyroxine (SYNTHROID, LEVOTHROID) 100 MCG tablet Take 100 mcg by mouth daily before breakfast.     . lidocaine (LMX) 4 % cream Apply 1 application topically 4 (four) times daily as needed (for pain.). LIDOCAINE PLUS CREAM    . MYRBETRIQ 50 MG TB24 tablet Take 50 mg by mouth daily.      No current facility-administered medications for this visit.     REVIEW OF SYSTEMS:   Constitutional: Denies fevers, chills or abnormal  night sweats Eyes: Denies blurriness of vision, double vision or watery eyes Ears, nose, mouth, throat, and face: Denies mucositis or sore throat Respiratory: Denies cough, dyspnea or wheezes Cardiovascular: Denies palpitation, chest discomfort or lower extremity swelling Gastrointestinal:  Denies nausea, heartburn or change in bowel habits Skin: Denies abnormal skin rashes Lymphatics: Denies new lymphadenopathy or easy bruising Neurological:Denies numbness, tingling or new weaknesses Behavioral/Psych: Mood is stable, no new changes   All other systems were reviewed with the patient and are negative.  PHYSICAL EXAMINATION: ECOG PERFORMANCE STATUS: 1 - Symptomatic but completely ambulatory  Vitals:   03/05/17 1535  BP: (!) 143/73  Pulse: 63  Resp: 17  Temp: 98.9 F (37.2 C)  SpO2: 96%   Filed Weights   03/05/17 1535  Weight: 170 lb (77.1 kg)    GENERAL:alert, no distress and comfortable SKIN: skin color, texture, turgor are normal, no rashes or significant lesions EYES: normal, conjunctiva are pink and non-injected, sclera clear OROPHARYNX:no exudate, no erythema and lips, buccal mucosa, and tongue normal  NECK: supple, thyroid normal size, non-tender, without nodularity LYMPH:  no palpable lymphadenopathy in the cervical, axillary or inguinal LUNGS: clear to auscultation and percussion with normal breathing effort HEART: regular rate & rhythm and no murmurs and no lower extremity edema ABDOMEN:abdomen soft, non-tender and normal bowel sounds Musculoskeletal:no cyanosis of digits and no clubbing  PSYCH: alert & oriented x 3 with fluent speech NEURO: no focal motor/sensory deficits  LABORATORY DATA:  I have reviewed the data as listed Lab Results  Component Value Date   WBC 7.8 02/21/2017   HGB 14.5 02/21/2017   HCT 43.4 02/21/2017   MCV 93.5 02/21/2017   PLT 231 02/21/2017   Lab Results  Component Value Date   NA 138 02/21/2017   K 4.0 02/21/2017   CL 102  02/21/2017   CO2 26 02/21/2017    RADIOGRAPHIC STUDIES: I have personally reviewed the radiological reports and agreed with the findings in the report.  ASSESSMENT AND PLAN:  Malignant neoplasm of upper-inner quadrant of left breast in female, estrogen receptor positive ([place]) Left lumpectomy 02/27/2017: IDC grade 1, 1.1 cm, ALH, margins negative, 0/2 lymph nodes negative, ER 90%, PR 10%, HER-2 negative ratio 1.26, Ki-67 1%, T1CN0 stage I a  Pathology counseling: I discussed the final pathology report of the patient provided  a copy of this report. I discussed the margins as well as lymph node surgeries. We also discussed the final staging along with previously performed ER/PR and HER-2/neu testing.  Recommendation: 1. +/- Adjuvant radiation therapy followed by 2. adjuvant antiestrogen therapy with letrozole 2.5 mg daily times 5  years  Return to clinic after radiation therapy is complete Patient is not very keen on doing radiation.  However I discussed with her extensively and urged her to meet with radiation oncology. Return to clinic based upon radiation treatment plan.  All questions were answered. The patient knows to call the clinic with any problems, questions or concerns.    Harriette Ohara, MD 03/05/17

## 2017-03-06 ENCOUNTER — Encounter: Payer: Self-pay | Admitting: *Deleted

## 2017-03-07 NOTE — Progress Notes (Signed)
Location of Breast Cancer: Left Breast  Histology per Pathology Report:  02/04/17 Diagnosis Breast, left, needle core biopsy, upper inner quadrant - INVASIVE DUCTAL CARCINOMA  Receptor Status: ER(90%), PR (10%), Her2-neu (NEG), Ki-(1%)  02/27/17 Diagnosis 1. Breast, lumpectomy, Left - INVASIVE DUCTAL CARCINOMA, GRADE I/III, SPANNING 1.1 CM - LOBULAR NEOPLASIA (ATYPICAL LOBULAR HYPERPLASIA). - THE SURGICAL RESECTION MARGINS ARE NEGATIVE FOR DUCTAL CARCINOMA. - SEE ONCOLOGY TABLE BELOW. 2. Lymph node, sentinel, biopsy, Left Axillary #1 - THERE IS NO EVIDENCE OF CARCINOMA IN 1 OF 1 LYMPH NODE (0/1). 3. Lymph node, sentinel, biopsy, Left Axillary #2 - THERE IS NO EVIDENCE OF CARCINOMA IN 1 OF 1 LYMPH NODE (0/1).  Did patient present with symptoms or was this found on screening mammography?: It was found on a screening mammogram.   Past/Anticipated interventions by surgeon, if any: 02/27/17 Procedure:  Left radioactive seed localized lumpectomy, left axillary sentinel lymph node biopsy, blue dye injection Surgeon:  Donnie Mesa K.   Past/Anticipated interventions by medical oncology, if any:  Dr. Lindi Adie 03/05/17 Recommendation: 1. +/- Adjuvant radiation therapy followed by 2. adjuvant antiestrogen therapy with letrozole 2.5 mg daily times 5 years  Return to clinic after radiation therapy is complete   Lymphedema issues, if any:  She denies. She has good arm mobility  Pain issues, if any:  She denies  SAFETY ISSUES:  Prior radiation? No  Pacemaker/ICD? No  Possible current pregnancy? No  Is the patient on methotrexate? No  Current Complaints / other details:    BP (!) 172/81   Pulse 60   Temp 98.8 F (37.1 C)   Ht 5' 2.5" (1.588 m)   Wt 175 lb 3.2 oz (79.5 kg)   SpO2 98% Comment: room air  BMI 31.53 kg/m    Wt Readings from Last 3 Encounters:  03/15/17 175 lb 3.2 oz (79.5 kg)  03/05/17 170 lb (77.1 kg)  02/27/17 171 lb 12.8 oz (77.9 kg)      Rupa Lagan,  Stephani Police, RN 03/07/2017,2:52 PM

## 2017-03-08 DIAGNOSIS — Z6832 Body mass index (BMI) 32.0-32.9, adult: Secondary | ICD-10-CM | POA: Diagnosis not present

## 2017-03-08 DIAGNOSIS — L259 Unspecified contact dermatitis, unspecified cause: Secondary | ICD-10-CM | POA: Diagnosis not present

## 2017-03-15 ENCOUNTER — Ambulatory Visit
Admission: RE | Admit: 2017-03-15 | Discharge: 2017-03-15 | Disposition: A | Discharge status: Home / Self Care | Payer: Medicare HMO | Source: Ambulatory Visit | Attending: Radiation Oncology | Admitting: Radiation Oncology

## 2017-03-15 ENCOUNTER — Encounter: Payer: Self-pay | Admitting: Radiation Oncology

## 2017-03-15 ENCOUNTER — Other Ambulatory Visit: Payer: Self-pay

## 2017-03-15 VITALS — BP 172/81 | HR 60 | Temp 98.8°F | Ht 62.5 in | Wt 175.2 lb

## 2017-03-15 DIAGNOSIS — C50212 Malignant neoplasm of upper-inner quadrant of left female breast: Secondary | ICD-10-CM | POA: Insufficient documentation

## 2017-03-15 DIAGNOSIS — R7303 Prediabetes: Secondary | ICD-10-CM | POA: Diagnosis not present

## 2017-03-15 DIAGNOSIS — K219 Gastro-esophageal reflux disease without esophagitis: Secondary | ICD-10-CM | POA: Diagnosis not present

## 2017-03-15 DIAGNOSIS — Z880 Allergy status to penicillin: Secondary | ICD-10-CM | POA: Insufficient documentation

## 2017-03-15 DIAGNOSIS — Z170 Estrogen receptor positive status [ER+]: Secondary | ICD-10-CM | POA: Diagnosis not present

## 2017-03-15 DIAGNOSIS — F329 Major depressive disorder, single episode, unspecified: Secondary | ICD-10-CM | POA: Diagnosis not present

## 2017-03-15 DIAGNOSIS — L309 Dermatitis, unspecified: Secondary | ICD-10-CM | POA: Diagnosis not present

## 2017-03-15 DIAGNOSIS — Z79899 Other long term (current) drug therapy: Secondary | ICD-10-CM | POA: Insufficient documentation

## 2017-03-15 DIAGNOSIS — E7800 Pure hypercholesterolemia, unspecified: Secondary | ICD-10-CM | POA: Insufficient documentation

## 2017-03-15 DIAGNOSIS — E039 Hypothyroidism, unspecified: Secondary | ICD-10-CM | POA: Diagnosis not present

## 2017-03-15 DIAGNOSIS — Z7982 Long term (current) use of aspirin: Secondary | ICD-10-CM | POA: Insufficient documentation

## 2017-03-15 DIAGNOSIS — Z79890 Hormone replacement therapy (postmenopausal): Secondary | ICD-10-CM | POA: Diagnosis not present

## 2017-03-15 DIAGNOSIS — M170 Bilateral primary osteoarthritis of knee: Secondary | ICD-10-CM | POA: Insufficient documentation

## 2017-03-15 DIAGNOSIS — Z9889 Other specified postprocedural states: Secondary | ICD-10-CM | POA: Diagnosis not present

## 2017-03-15 DIAGNOSIS — R69 Illness, unspecified: Secondary | ICD-10-CM | POA: Diagnosis not present

## 2017-03-15 NOTE — Progress Notes (Signed)
Radiation Oncology         [phone] ________________________________  Name: Breanna Hill MRN: [id number]  Date: 03/15/2017  DOB: Mar 26, 1940  SIMULATION AND TREATMENT PLANNING NOTE  //  Special treatment procedure   Outpatient  DIAGNOSIS:     ICD-10-CM   1. Malignant neoplasm of upper-inner quadrant of left breast in female, estrogen receptor positive ([place]) C50.212    Z17.0     NARRATIVE:  The patient was brought to the [facility].  Identity was confirmed.  All relevant records and images related to the planned course of therapy were reviewed.  The patient freely provided informed written consent to proceed with treatment after reviewing the details related to the planned course of therapy. The consent form was witnessed and verified by the simulation staff.    Then, the patient was set-up in a stable reproducible supine position for radiation therapy with her ipsilateral arm over her head, and her upper body secured in a custom-made Vac-lok device.  CT images were obtained.  Surface markings were placed.  The CT images were loaded into the planning software.    Special treatment procedure:  Special treatment procedure was performed today due to the extra time and effort required by myself to plan and prepare this patient for deep inspiration breath hold technique.  I have determined cardiac sparing to be of benefit to this patient to prevent long term cardiac damage due to radiation of the heart.  Bellows were placed on the patient's abdomen. To facilitate cardiac sparing, the patient was coached by the radiation therapists on breath hold techniques and breathing practice was performed. Practice waveforms were obtained. The patient was then scanned while maintaining breath hold in the treatment position.  This image was then transferred over to the imaging specialist. The imaging specialist then created a fusion of the free breathing and breath hold scans using the chest  wall as the stable structure. I personally reviewed the fusion in axial, coronal and sagittal image planes.  Excellent cardiac sparing was obtained.  I felt the patient is an appropriate candidate for breath hold and the patient will be treated as such.  The image fusion was then reviewed with the patient to reinforce the necessity of reproducible breath hold.  TREATMENT PLANNING NOTE: Treatment planning then occurred.  The radiation prescription was entered and confirmed.     A total of 3 medically necessary complex treatment devices were fabricated and supervised by me: 2 fields with MLCs for custom blocks to protect heart, and lungs;  and, a Vac-lok. MORE COMPLEX DEVICES MAY BE MADE IN DOSIMETRY FOR FIELD IN FIELD BEAMS FOR DOSE HOMOGENEITY.  I have requested : 3D Simulation which is medically necessary to give adequate dose to at risk tissues while sparing lungs and heart.  I have requested a DVH of the following structures: lungs, heart, lumpectomy cavity.    The patient will receive 40.05 Gy in 15 fractions to the left breast with 2 tangential fields.   This will be followed by a boost.  Optical Surface Tracking Plan:  Since intensity modulated radiotherapy (IMRT) and 3D conformal radiation treatment methods are predicated on accurate and precise positioning for treatment, intrafraction motion monitoring is medically necessary to ensure accurate and safe treatment delivery. The ability to quantify intrafraction motion without excessive ionizing radiation dose can only be performed with optical surface tracking. Accordingly, surface imaging offers the opportunity to obtain 3D measurements of patient position throughout IMRT and 3D treatments  without excessive radiation exposure. I am ordering optical surface tracking for this patient's upcoming course of radiotherapy.  ________________________________   Reference:  Ursula Alert, J, et al. Surface imaging-based analysis of  intrafraction motion for breast radiotherapy patients.Journal of [place], n. 6, nov. 2014. ISSN [id number].  Available at: <http://www.jacmp.org/index.php/jacmp/article/view/4957>.    -----------------------------------  [name], MD

## 2017-03-15 NOTE — Progress Notes (Signed)
Radiation Oncology         [phone] ________________________________  Initial Outpatient Consultation  Name: Breanna Hill MRN: [id number]  Date: 03/15/2017  DOB: Jun 04, 1940  FT:DDUKG, Carloyn Manner, MD  Donnie Mesa, MD   REFERRING PHYSICIAN: Donnie Mesa, MD  DIAGNOSIS:    ICD-10-CM   1. 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 female who presented with a distortion in the left breast seen on screening mammogram dated 01/10/2017. Diagnostic mammogram and ultrasound of the left breast showed two suspicious adjacent spiculated masses together measuring 1.1 cm. Biopsy on 02/04/2017 showed invasive ductal carcinoma with characteristics as described above in the diagnosis.  She subsequently underwent left breast lumpectomy with sentinel lymph node biopsy by Dr. Georgette Dover on 02/27/2017. Pathology revealed grade 1 invasive ductal carcinoma spanning 1.1 cm with negative margins and 2 negative lymph nodes.   She saw Dr. Lindi Adie on 03/05/2017 who recommended adjuvant antiestrogen therapy with letrozole, taken daily over the next 5 years, following optional adjuvant radiation therapy. She has been referred today for discussion of adjuvant radiation treatment. She denies any pain or lymphedema issues since surgery and has good arm mobility. She resides in [place] and spends her time taking care of her husband who has Parkinson's disease.  On review of systems, the patient reports dermatitis to her chest. 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   PREVIOUS RADIATION THERAPY: No  PAST MEDICAL HISTORY:  has a past medical history of Arthritis, Cancer ([place]), Depression, GERD (gastroesophageal reflux  disease), High cholesterol (10/17/2015), Hypothyroidism, and Pre-diabetes.    PAST SURGICAL HISTORY: Past Surgical History:  Procedure Laterality Date  . [place]  . BREAST LUMPECTOMY WITH RADIOACTIVE SEED AND SENTINEL LYMPH NODE BIOPSY Left 02/27/2017   Procedure: LEFT BREAST LUMPECTOMY WITH RADIOACTIVE SEED AND LEFT AXILLARY SENTINEL LYMPH NODE BIOPSY;  Surgeon: Donnie Mesa, MD;  Location: [facility];  Service: General;  Laterality: Left;  . COLONOSCOPY     X 2  . COLONOSCOPY N/A 02/22/2016   Procedure: COLONOSCOPY;  Surgeon: Rogene Houston, MD;  [facility];  Service: Endoscopy;  Laterality: N/A;  1:45  . EYE SURGERY Bilateral    phlebpheroplasty  . Left shoultder surgery     for bone spurs & frozen shoulder   . Right bunionectomy    . TONSILLECTOMY    . TUBAL LIGATION      FAMILY HISTORY: family history is not on file.  SOCIAL HISTORY:  reports that  has never smoked. she has never used smokeless tobacco. She reports that she drinks alcohol. She reports that she does not use drugs.  ALLERGIES: Penicillins  MEDICATIONS:  Current Outpatient Medications  Medication Sig Dispense Refill  . acetaminophen (TYLENOL) 500 MG tablet Take 500-1,000 mg by mouth every 6 (six) hours as needed (for pain.).    Marland Kitchen aspirin EC 81 MG tablet Take 81 mg by mouth daily.    Marland Kitchen atorvastatin (LIPITOR) 20 MG tablet Take 20 mg by mouth every evening.     . escitalopram (LEXAPRO) 10 MG tablet Take 5 mg by mouth daily before breakfast.     . ferrous sulfate 325 (65 FE) MG tablet Take  325 mg by mouth every evening.    Marland Kitchen levothyroxine (SYNTHROID, LEVOTHROID) 100 MCG tablet Take 100 mcg by mouth daily before breakfast.     . lidocaine (LMX) 4 % cream Apply 1 application topically 4 (four) times daily as needed (for pain.). LIDOCAINE PLUS CREAM    . methylPREDNISolone (MEDROL DOSEPAK) 4 MG TBPK tablet Take by mouth. She is not sure of the dose, but will complete her dose tomorrow 03/16/17    .  MYRBETRIQ 50 MG TB24 tablet Take 50 mg by mouth daily.      No current facility-administered medications for this encounter.     REVIEW OF SYSTEMS: A 10+ POINT REVIEW OF SYSTEMS WAS OBTAINED including neurology, dermatology, psychiatry, cardiac, respiratory, lymph, extremities, GI, GU, Musculoskeletal, constitutional, breasts, reproductive, HEENT.  All pertinent positives are noted in the HPI.  All others are negative.   PHYSICAL EXAM:  height is 5' 2.5" (1.588 m) and weight is 175 lb 3.2 oz (79.5 kg). Her temperature is 98.8 F (37.1 C). Her blood pressure is 172/81 (abnormal) and her pulse is 60. Her oxygen saturation is 98%.   General: Alert and oriented, in no acute distress. HEENT: Head is normocephalic. Extraocular movements are intact. Oral cavity is clear. Neck: Neck is supple, no palpable cervical or supraclavicular lymphadenopathy. Heart: Regular in rate and rhythm with no murmurs, rubs, or gallops. Chest: Clear to auscultation bilaterally, with no rhonchi, wheezes, or rales. Abdomen: Soft, nontender, nondistended, with no rigidity or guarding. Extremities: Trace edema in her bilateral ankles. Lymphatics: see Neck Exam Skin: Erythematous macular splotches over her upper central chest with dermatitis from the bandage. Musculoskeletal: Symmetric strength and muscle tone throughout. Neurologic: Cranial nerves II through XII are grossly intact. No obvious focalities. Speech is fluent. Coordination is intact. Finger to nose testing intact. Psychiatric: Judgment and insight are intact. Affect is appropriate. Breasts: She has a central lumpectomy scar and axillary scar on the left both healing very well.  No other palpable masses appreciated in the right breast or axilla.     ECOG = 0  0 - Asymptomatic (Fully active, able to carry on all predisease activities without restriction)  1 - Symptomatic but completely ambulatory (Restricted in physically strenuous activity but ambulatory and  able to carry out work of a light or sedentary nature. For example, light housework, office work)  2 - Symptomatic, <50% in bed during the day (Ambulatory and capable of all self care but unable to carry out any work activities. Up and about more than 50% of waking hours)  3 - Symptomatic, >50% in bed, but not bedbound (Capable of only limited self-care, confined to bed or chair 50% or more of waking hours)  4 - Bedbound (Completely disabled. Cannot carry on any self-care. Totally confined to bed or chair)  5 - Death   Eustace Pen MM, Creech RH, Tormey DC, et al. [phone]). "Toxicity and response criteria of the [facility]". [place] Oncol. 5 (6): 649-55   LABORATORY DATA:  Lab Results  Component Value Date   WBC 7.8 02/21/2017   HGB 14.5 02/21/2017   HCT 43.4 02/21/2017   MCV 93.5 02/21/2017   PLT 231 02/21/2017   CMP     Component Value Date/Time   NA 138 02/21/2017 1620   K 4.0 02/21/2017 1620   CL 102 02/21/2017 1620   CO2 26 02/21/2017 1620   GLUCOSE 134 (H) 02/21/2017 1620   BUN 18 02/21/2017 1620   CREATININE 0.87  02/21/2017 1620   CALCIUM 9.1 02/21/2017 1620   PROT 6.9 03/01/2014 0900   ALBUMIN 3.8 03/01/2014 0900   AST 25 03/01/2014 0900   ALT 27 03/01/2014 0900   ALKPHOS 80 03/01/2014 0900   BILITOT 0.3 03/01/2014 0900   GFRNONAA >60 02/21/2017 1620   GFRAA >60 02/21/2017 1620         RADIOGRAPHY: Nm Sentinel Node Inj-no Rpt (breast)  Result Date: 02/27/2017 Sulfur colloid was injected by the nuclear medicine technologist for melanoma sentinel node.   Mm Breast Surgical Specimen  Result Date: 02/27/2017 CLINICAL DATA:  Specimen radiograph status post left breast lumpectomy. EXAM: SPECIMEN RADIOGRAPH OF THE LEFT BREAST COMPARISON:  Previous exam(s). FINDINGS: Status post excision of the left breast. The radioactive seed and biopsy marker clip are present, completely intact, and were marked for pathology. This was communicated with the OR  at 11:30 a.m. IMPRESSION: Specimen radiograph of the left breast. Electronically Signed   By: Ammie Ferrier M.D.   On: 02/27/2017 11:33   Mm Lt Radioactive Seed Loc Mammo Guide  Result Date: 02/26/2017 CLINICAL DATA:  Patient for preoperative localization prior to left breast lumpectomy. EXAM: MAMMOGRAPHIC GUIDED RADIOACTIVE SEED LOCALIZATION OF THE LEFT BREAST COMPARISON:  Previous exam(s). FINDINGS: Patient presents for radioactive seed localization prior to left breast lumpectomy. I met with the patient and we discussed the procedure of seed localization including benefits and alternatives. We discussed the high likelihood of a successful procedure. We discussed the risks of the procedure including infection, bleeding, tissue injury and further surgery. We discussed the low dose of radioactivity involved in the procedure. Informed, written consent was given. The usual time-out protocol was performed immediately prior to the procedure. Using mammographic guidance, sterile technique, 1% lidocaine and an I-125 radioactive seed, two adjacent spiculated masses within the medial posterior left breast were localized using a medial approach. The follow-up mammogram images confirm the seed in the expected location and were marked for Dr. Georgette Dover. Note the radioactive seed is located along the posterior inferior margin of the spiculated masses. The coil shaped biopsy marking clip was displaced during the initial biopsy and is located 1 cm lateral to the radioactive seed. Follow-up survey of the patient confirms presence of the radioactive seed. Order number of I-125 seed:  [id number]. Total activity:  5.053 millicuries reference Date: 01/02/2017 The patient tolerated the procedure well and was released from the [place]. She was given instructions regarding seed removal. IMPRESSION: Radioactive seed localization left breast. No apparent complications. Noto the radioactive seed is located along the posterior  inferior margin of the spiculated masses. The biopsy marking clip is located approximately 1 cm lateral to the radioactive seed as the clip was displaced during the biopsy. Electronically Signed   By: Lovey Newcomer M.D.   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 One option would be antiestrogen therapy as discussed with medical oncology. She would take a pill for several years.   The most aggressive option would be to pursue both modalities of the pill and radiotherapy to the breast.  Of note, I discussed the data from the [facility] al trial in the [place] [facility]. She understands that tamoxifen compared to radiation plus tamoxifen demonstrated no survival benefit among  the women in this study. The women were 4 years or older with stage I estrogen receptor positive breast cancer. Based on this study, I told the patient that her overall life expectancy should not be affected by adding radiotherapy to antiestrogen medication. She understands that the main benefit of  adding radiotherapy to anti estrogen therapy would be a very small but measurable local control benefit (risk of local recurrence to be lowered from ~10% --> ~2% over a decade).  We discussed the fact that radiotherapy only provides a local control benefit while anti-estrogen pills provide systemic coverage. That being said, the risk of systemic failure is relatively low with her type of breast cancer.  We discussed the risks benefits and side effects of radiotherapy. She understands that the side effects would likely include some skin irritation and fatigue during the weeks of radiation. There is a risk of late effects which include but are not necessarily limited to cosmetic changes and rare lung  toxicity. I would anticipate delivering approximately 4 weeks of radiotherapy.  After a thorough discussion, the patient is enthusiastic about proceeding with radiotherapy.  It was a pleasure meeting the patient today. We discussed the risks, benefits, and side effects of radiotherapy.   We spoke about acute effects including skin irritation and fatigue as well as much less common late effects including internal organ injury or irritation. We spoke about the latest technology that is used to minimize the risk of late effects for patients undergoing radiotherapy to the breast or chest wall. No guarantees of treatment were given. The patient is enthusiastic about proceeding with treatment. She would like to proceed with 4 weeks of radiation to her left breast. I look forward to participating in the patient's care.  Her CT simulation/treatment planning will be done today with radiation treatment to begin at the end of the month. We discussed and signed the radiation oncology consent form, and a copy was retained for our records.  I spent 25 minutes face to face with the patient and more than 50% of that time was spent in counseling and/or coordination of care.   __________________________________________   [name], MD  This document serves as a record of services personally performed by [name], MD. It was created on her behalf by Rae Lips, a trained medical scribe. The creation of this record is based on the scribe's personal observations and the provider's statements to them. This document has been checked and approved by the attending provider.

## 2017-03-18 ENCOUNTER — Encounter: Payer: Self-pay | Admitting: Radiation Oncology

## 2017-03-19 ENCOUNTER — Ambulatory Visit (INDEPENDENT_AMBULATORY_CARE_PROVIDER_SITE_OTHER): Payer: Medicare HMO | Admitting: Urology

## 2017-03-19 DIAGNOSIS — R351 Nocturia: Secondary | ICD-10-CM | POA: Diagnosis not present

## 2017-03-19 DIAGNOSIS — N3281 Overactive bladder: Secondary | ICD-10-CM

## 2017-03-19 DIAGNOSIS — C50212 Malignant neoplasm of upper-inner quadrant of left female breast: Secondary | ICD-10-CM | POA: Diagnosis not present

## 2017-03-19 DIAGNOSIS — Z170 Estrogen receptor positive status [ER+]: Secondary | ICD-10-CM | POA: Diagnosis not present

## 2017-03-22 ENCOUNTER — Ambulatory Visit: Payer: Medicare HMO | Admitting: Radiation Oncology

## 2017-03-25 ENCOUNTER — Ambulatory Visit: Payer: Medicare HMO

## 2017-03-26 ENCOUNTER — Ambulatory Visit
Admission: RE | Admit: 2017-03-26 | Discharge: 2017-03-26 | Disposition: A | Discharge status: Home / Self Care | Payer: Medicare HMO | Source: Ambulatory Visit | Attending: Radiation Oncology | Admitting: Radiation Oncology

## 2017-03-26 ENCOUNTER — Ambulatory Visit: Payer: Medicare HMO

## 2017-03-26 DIAGNOSIS — C50212 Malignant neoplasm of upper-inner quadrant of left female breast: Secondary | ICD-10-CM | POA: Diagnosis not present

## 2017-03-26 DIAGNOSIS — Z170 Estrogen receptor positive status [ER+]: Secondary | ICD-10-CM | POA: Diagnosis not present

## 2017-03-27 ENCOUNTER — Ambulatory Visit
Admission: RE | Admit: 2017-03-27 | Discharge: 2017-03-27 | Disposition: A | Discharge status: Home / Self Care | Payer: Medicare HMO | Source: Ambulatory Visit | Attending: Radiation Oncology | Admitting: Radiation Oncology

## 2017-03-27 DIAGNOSIS — Z170 Estrogen receptor positive status [ER+]: Secondary | ICD-10-CM | POA: Diagnosis not present

## 2017-03-27 DIAGNOSIS — C50212 Malignant neoplasm of upper-inner quadrant of left female breast: Secondary | ICD-10-CM | POA: Diagnosis not present

## 2017-03-28 ENCOUNTER — Ambulatory Visit
Admission: RE | Admit: 2017-03-28 | Discharge: 2017-03-28 | Disposition: A | Discharge status: Home / Self Care | Payer: Medicare HMO | Source: Ambulatory Visit | Attending: Radiation Oncology | Admitting: Radiation Oncology

## 2017-03-28 DIAGNOSIS — C50212 Malignant neoplasm of upper-inner quadrant of left female breast: Secondary | ICD-10-CM | POA: Diagnosis not present

## 2017-03-28 DIAGNOSIS — Z170 Estrogen receptor positive status [ER+]: Secondary | ICD-10-CM | POA: Diagnosis not present

## 2017-03-29 ENCOUNTER — Ambulatory Visit
Admission: RE | Admit: 2017-03-29 | Discharge: 2017-03-29 | Disposition: A | Discharge status: Home / Self Care | Payer: Medicare HMO | Source: Ambulatory Visit | Attending: Radiation Oncology | Admitting: Radiation Oncology

## 2017-03-29 DIAGNOSIS — Z170 Estrogen receptor positive status [ER+]: Secondary | ICD-10-CM | POA: Diagnosis not present

## 2017-03-29 DIAGNOSIS — C50212 Malignant neoplasm of upper-inner quadrant of left female breast: Secondary | ICD-10-CM | POA: Diagnosis not present

## 2017-04-01 ENCOUNTER — Ambulatory Visit
Admission: RE | Admit: 2017-04-01 | Discharge: 2017-04-01 | Disposition: A | Discharge status: Home / Self Care | Payer: Medicare HMO | Source: Ambulatory Visit | Attending: Radiation Oncology | Admitting: Radiation Oncology

## 2017-04-01 DIAGNOSIS — C50212 Malignant neoplasm of upper-inner quadrant of left female breast: Secondary | ICD-10-CM | POA: Insufficient documentation

## 2017-04-01 DIAGNOSIS — Z170 Estrogen receptor positive status [ER+]: Secondary | ICD-10-CM | POA: Diagnosis not present

## 2017-04-01 DIAGNOSIS — Z510 Encounter for antineoplastic radiation therapy: Secondary | ICD-10-CM | POA: Diagnosis not present

## 2017-04-01 MED ORDER — ALRA NON-METALLIC DEODORANT (RAD-ONC)
1.0000 "application " | Freq: Once | TOPICAL | Status: AC
  Administered 2017-04-01: 1 via TOPICAL

## 2017-04-01 MED ORDER — RADIAPLEXRX EX GEL
Freq: Once | CUTANEOUS | Status: AC
  Administered 2017-04-01: 10:00:00 via TOPICAL

## 2017-04-01 NOTE — Progress Notes (Signed)
Pt here for patient teaching.  Pt given Radiation and You booklet, skin care instructions, Alra deodorant and Radiaplex gel.  Reviewed areas of pertinence such as fatigue, hair loss, skin changes, breast tenderness and breast swelling . Pt able to give teach back of to pat skin, use unscented/gentle soap and drink plenty of water,apply Radiaplex bid, avoid applying anything to skin within 4 hours of treatment, avoid wearing an under wire bra and to use an electric razor if they must shave. Pt verbalizes understanding of information given and will contact nursing with any questions or concerns.     Http://rtanswers.org/treatmentinformation/whattoexpect/index      

## 2017-04-02 ENCOUNTER — Ambulatory Visit
Admission: RE | Admit: 2017-04-02 | Discharge: 2017-04-02 | Disposition: A | Discharge status: Home / Self Care | Payer: Medicare HMO | Source: Ambulatory Visit | Attending: Radiation Oncology | Admitting: Radiation Oncology

## 2017-04-02 DIAGNOSIS — Z510 Encounter for antineoplastic radiation therapy: Secondary | ICD-10-CM | POA: Diagnosis not present

## 2017-04-02 DIAGNOSIS — Z170 Estrogen receptor positive status [ER+]: Secondary | ICD-10-CM | POA: Diagnosis not present

## 2017-04-02 DIAGNOSIS — C50212 Malignant neoplasm of upper-inner quadrant of left female breast: Secondary | ICD-10-CM | POA: Diagnosis not present

## 2017-04-03 ENCOUNTER — Ambulatory Visit
Admission: RE | Admit: 2017-04-03 | Discharge: 2017-04-03 | Disposition: A | Discharge status: Home / Self Care | Payer: Medicare HMO | Source: Ambulatory Visit | Attending: Radiation Oncology | Admitting: Radiation Oncology

## 2017-04-03 DIAGNOSIS — Z170 Estrogen receptor positive status [ER+]: Secondary | ICD-10-CM | POA: Diagnosis not present

## 2017-04-03 DIAGNOSIS — Z510 Encounter for antineoplastic radiation therapy: Secondary | ICD-10-CM | POA: Diagnosis not present

## 2017-04-03 DIAGNOSIS — C50212 Malignant neoplasm of upper-inner quadrant of left female breast: Secondary | ICD-10-CM | POA: Diagnosis not present

## 2017-04-04 ENCOUNTER — Ambulatory Visit
Admission: RE | Admit: 2017-04-04 | Discharge: 2017-04-04 | Disposition: A | Discharge status: Home / Self Care | Payer: Medicare HMO | Source: Ambulatory Visit | Attending: Radiation Oncology | Admitting: Radiation Oncology

## 2017-04-04 DIAGNOSIS — C50212 Malignant neoplasm of upper-inner quadrant of left female breast: Secondary | ICD-10-CM | POA: Diagnosis not present

## 2017-04-04 DIAGNOSIS — Z510 Encounter for antineoplastic radiation therapy: Secondary | ICD-10-CM | POA: Diagnosis not present

## 2017-04-04 DIAGNOSIS — Z170 Estrogen receptor positive status [ER+]: Secondary | ICD-10-CM | POA: Diagnosis not present

## 2017-04-05 ENCOUNTER — Ambulatory Visit
Admission: RE | Admit: 2017-04-05 | Discharge: 2017-04-05 | Disposition: A | Discharge status: Home / Self Care | Payer: Medicare HMO | Source: Ambulatory Visit | Attending: Radiation Oncology | Admitting: Radiation Oncology

## 2017-04-05 DIAGNOSIS — Z510 Encounter for antineoplastic radiation therapy: Secondary | ICD-10-CM | POA: Diagnosis not present

## 2017-04-05 DIAGNOSIS — Z170 Estrogen receptor positive status [ER+]: Secondary | ICD-10-CM | POA: Diagnosis not present

## 2017-04-05 DIAGNOSIS — C50212 Malignant neoplasm of upper-inner quadrant of left female breast: Secondary | ICD-10-CM | POA: Diagnosis not present

## 2017-04-08 ENCOUNTER — Ambulatory Visit
Admission: RE | Admit: 2017-04-08 | Discharge: 2017-04-08 | Disposition: A | Discharge status: Home / Self Care | Payer: Medicare HMO | Source: Ambulatory Visit | Attending: Radiation Oncology | Admitting: Radiation Oncology

## 2017-04-08 DIAGNOSIS — Z510 Encounter for antineoplastic radiation therapy: Secondary | ICD-10-CM | POA: Diagnosis not present

## 2017-04-08 DIAGNOSIS — C50212 Malignant neoplasm of upper-inner quadrant of left female breast: Secondary | ICD-10-CM | POA: Diagnosis not present

## 2017-04-08 DIAGNOSIS — Z170 Estrogen receptor positive status [ER+]: Secondary | ICD-10-CM | POA: Diagnosis not present

## 2017-04-09 ENCOUNTER — Ambulatory Visit
Admission: RE | Admit: 2017-04-09 | Discharge: 2017-04-09 | Disposition: A | Discharge status: Home / Self Care | Payer: Medicare HMO | Source: Ambulatory Visit | Attending: Radiation Oncology | Admitting: Radiation Oncology

## 2017-04-09 DIAGNOSIS — C50212 Malignant neoplasm of upper-inner quadrant of left female breast: Secondary | ICD-10-CM | POA: Diagnosis not present

## 2017-04-09 DIAGNOSIS — Z170 Estrogen receptor positive status [ER+]: Secondary | ICD-10-CM | POA: Diagnosis not present

## 2017-04-09 DIAGNOSIS — Z510 Encounter for antineoplastic radiation therapy: Secondary | ICD-10-CM | POA: Diagnosis not present

## 2017-04-10 ENCOUNTER — Ambulatory Visit
Admission: RE | Admit: 2017-04-10 | Discharge: 2017-04-10 | Disposition: A | Discharge status: Home / Self Care | Payer: Medicare HMO | Source: Ambulatory Visit | Attending: Radiation Oncology | Admitting: Radiation Oncology

## 2017-04-10 DIAGNOSIS — C50212 Malignant neoplasm of upper-inner quadrant of left female breast: Secondary | ICD-10-CM | POA: Diagnosis not present

## 2017-04-10 DIAGNOSIS — Z510 Encounter for antineoplastic radiation therapy: Secondary | ICD-10-CM | POA: Diagnosis not present

## 2017-04-10 DIAGNOSIS — Z170 Estrogen receptor positive status [ER+]: Secondary | ICD-10-CM | POA: Diagnosis not present

## 2017-04-11 ENCOUNTER — Ambulatory Visit: Payer: Medicare HMO

## 2017-04-11 ENCOUNTER — Ambulatory Visit
Admission: RE | Admit: 2017-04-11 | Discharge: 2017-04-11 | Disposition: A | Discharge status: Home / Self Care | Payer: Medicare HMO | Source: Ambulatory Visit | Attending: Radiation Oncology | Admitting: Radiation Oncology

## 2017-04-11 DIAGNOSIS — Z170 Estrogen receptor positive status [ER+]: Secondary | ICD-10-CM | POA: Diagnosis not present

## 2017-04-11 DIAGNOSIS — Z510 Encounter for antineoplastic radiation therapy: Secondary | ICD-10-CM | POA: Diagnosis not present

## 2017-04-11 DIAGNOSIS — C50212 Malignant neoplasm of upper-inner quadrant of left female breast: Secondary | ICD-10-CM | POA: Diagnosis not present

## 2017-04-12 ENCOUNTER — Ambulatory Visit: Payer: Medicare HMO

## 2017-04-12 ENCOUNTER — Ambulatory Visit
Admission: RE | Admit: 2017-04-12 | Discharge: 2017-04-12 | Disposition: A | Discharge status: Home / Self Care | Payer: Medicare HMO | Source: Ambulatory Visit | Attending: Radiation Oncology | Admitting: Radiation Oncology

## 2017-04-12 DIAGNOSIS — Z510 Encounter for antineoplastic radiation therapy: Secondary | ICD-10-CM | POA: Diagnosis not present

## 2017-04-12 DIAGNOSIS — C50212 Malignant neoplasm of upper-inner quadrant of left female breast: Secondary | ICD-10-CM | POA: Diagnosis not present

## 2017-04-12 DIAGNOSIS — Z170 Estrogen receptor positive status [ER+]: Secondary | ICD-10-CM | POA: Diagnosis not present

## 2017-04-15 ENCOUNTER — Ambulatory Visit
Admission: RE | Admit: 2017-04-15 | Discharge: 2017-04-15 | Disposition: A | Discharge status: Home / Self Care | Payer: Medicare HMO | Source: Ambulatory Visit | Attending: Radiation Oncology | Admitting: Radiation Oncology

## 2017-04-15 ENCOUNTER — Ambulatory Visit: Payer: Medicare HMO | Admitting: Radiation Oncology

## 2017-04-15 DIAGNOSIS — Z510 Encounter for antineoplastic radiation therapy: Secondary | ICD-10-CM | POA: Diagnosis not present

## 2017-04-15 DIAGNOSIS — C50212 Malignant neoplasm of upper-inner quadrant of left female breast: Secondary | ICD-10-CM | POA: Diagnosis not present

## 2017-04-15 DIAGNOSIS — Z170 Estrogen receptor positive status [ER+]: Secondary | ICD-10-CM | POA: Diagnosis not present

## 2017-04-16 ENCOUNTER — Ambulatory Visit
Admission: RE | Admit: 2017-04-16 | Discharge: 2017-04-16 | Disposition: A | Discharge status: Home / Self Care | Payer: Medicare HMO | Source: Ambulatory Visit | Attending: Radiation Oncology | Admitting: Radiation Oncology

## 2017-04-16 DIAGNOSIS — Z170 Estrogen receptor positive status [ER+]: Secondary | ICD-10-CM | POA: Diagnosis not present

## 2017-04-16 DIAGNOSIS — Z510 Encounter for antineoplastic radiation therapy: Secondary | ICD-10-CM | POA: Diagnosis not present

## 2017-04-16 DIAGNOSIS — C50212 Malignant neoplasm of upper-inner quadrant of left female breast: Secondary | ICD-10-CM | POA: Diagnosis not present

## 2017-04-17 ENCOUNTER — Ambulatory Visit
Admission: RE | Admit: 2017-04-17 | Discharge: 2017-04-17 | Disposition: A | Discharge status: Home / Self Care | Payer: Medicare HMO | Source: Ambulatory Visit | Attending: Radiation Oncology | Admitting: Radiation Oncology

## 2017-04-17 DIAGNOSIS — Z170 Estrogen receptor positive status [ER+]: Secondary | ICD-10-CM | POA: Diagnosis not present

## 2017-04-17 DIAGNOSIS — C50212 Malignant neoplasm of upper-inner quadrant of left female breast: Secondary | ICD-10-CM | POA: Diagnosis not present

## 2017-04-17 DIAGNOSIS — Z510 Encounter for antineoplastic radiation therapy: Secondary | ICD-10-CM | POA: Diagnosis not present

## 2017-04-18 ENCOUNTER — Ambulatory Visit
Admission: RE | Admit: 2017-04-18 | Discharge: 2017-04-18 | Disposition: A | Discharge status: Home / Self Care | Payer: Medicare HMO | Source: Ambulatory Visit | Attending: Radiation Oncology | Admitting: Radiation Oncology

## 2017-04-18 DIAGNOSIS — C50212 Malignant neoplasm of upper-inner quadrant of left female breast: Secondary | ICD-10-CM | POA: Diagnosis not present

## 2017-04-18 DIAGNOSIS — Z170 Estrogen receptor positive status [ER+]: Secondary | ICD-10-CM | POA: Diagnosis not present

## 2017-04-18 DIAGNOSIS — Z510 Encounter for antineoplastic radiation therapy: Secondary | ICD-10-CM | POA: Diagnosis not present

## 2017-04-19 ENCOUNTER — Ambulatory Visit
Admission: RE | Admit: 2017-04-19 | Discharge: 2017-04-19 | Disposition: A | Discharge status: Home / Self Care | Payer: Medicare HMO | Source: Ambulatory Visit | Attending: Radiation Oncology | Admitting: Radiation Oncology

## 2017-04-19 DIAGNOSIS — Z510 Encounter for antineoplastic radiation therapy: Secondary | ICD-10-CM | POA: Diagnosis not present

## 2017-04-19 DIAGNOSIS — C50212 Malignant neoplasm of upper-inner quadrant of left female breast: Secondary | ICD-10-CM | POA: Diagnosis not present

## 2017-04-19 DIAGNOSIS — Z170 Estrogen receptor positive status [ER+]: Secondary | ICD-10-CM | POA: Diagnosis not present

## 2017-04-22 ENCOUNTER — Ambulatory Visit
Admission: RE | Admit: 2017-04-22 | Discharge: 2017-04-22 | Disposition: A | Discharge status: Home / Self Care | Payer: Medicare HMO | Source: Ambulatory Visit | Attending: Radiation Oncology | Admitting: Radiation Oncology

## 2017-04-22 DIAGNOSIS — Z170 Estrogen receptor positive status [ER+]: Secondary | ICD-10-CM | POA: Diagnosis not present

## 2017-04-22 DIAGNOSIS — C50212 Malignant neoplasm of upper-inner quadrant of left female breast: Secondary | ICD-10-CM | POA: Diagnosis not present

## 2017-04-22 DIAGNOSIS — Z510 Encounter for antineoplastic radiation therapy: Secondary | ICD-10-CM | POA: Diagnosis not present

## 2017-04-23 ENCOUNTER — Ambulatory Visit
Admission: RE | Admit: 2017-04-23 | Discharge: 2017-04-23 | Disposition: A | Discharge status: Home / Self Care | Payer: Medicare HMO | Source: Ambulatory Visit | Attending: Radiation Oncology | Admitting: Radiation Oncology

## 2017-04-23 DIAGNOSIS — Z510 Encounter for antineoplastic radiation therapy: Secondary | ICD-10-CM | POA: Diagnosis not present

## 2017-04-23 DIAGNOSIS — Z170 Estrogen receptor positive status [ER+]: Secondary | ICD-10-CM | POA: Diagnosis not present

## 2017-04-23 DIAGNOSIS — C50212 Malignant neoplasm of upper-inner quadrant of left female breast: Secondary | ICD-10-CM | POA: Diagnosis not present

## 2017-04-30 ENCOUNTER — Telehealth: Payer: Self-pay | Admitting: Hematology and Oncology

## 2017-04-30 ENCOUNTER — Inpatient Hospital Stay: Payer: Medicare HMO | Attending: Hematology and Oncology | Admitting: Hematology and Oncology

## 2017-04-30 DIAGNOSIS — Z79899 Other long term (current) drug therapy: Secondary | ICD-10-CM | POA: Diagnosis not present

## 2017-04-30 DIAGNOSIS — Z7982 Long term (current) use of aspirin: Secondary | ICD-10-CM | POA: Insufficient documentation

## 2017-04-30 DIAGNOSIS — C50212 Malignant neoplasm of upper-inner quadrant of left female breast: Secondary | ICD-10-CM | POA: Diagnosis not present

## 2017-04-30 DIAGNOSIS — Z923 Personal history of irradiation: Secondary | ICD-10-CM | POA: Diagnosis not present

## 2017-04-30 DIAGNOSIS — Z79811 Long term (current) use of aromatase inhibitors: Secondary | ICD-10-CM | POA: Diagnosis not present

## 2017-04-30 DIAGNOSIS — R5383 Other fatigue: Secondary | ICD-10-CM | POA: Insufficient documentation

## 2017-04-30 DIAGNOSIS — Z170 Estrogen receptor positive status [ER+]: Secondary | ICD-10-CM | POA: Insufficient documentation

## 2017-04-30 MED ORDER — LETROZOLE 2.5 MG PO TABS: 2.5000 mg | ORAL_TABLET | Freq: Every day | ORAL | 3 refills | Status: DC

## 2017-04-30 NOTE — Telephone Encounter (Signed)
Gave avs and calendar per patient request 7/29

## 2017-04-30 NOTE — Assessment & Plan Note (Signed)
Left lumpectomy 02/27/2017: IDC grade 1, 1.1 cm, ALH, margins negative, 0/2 lymph nodes negative, ER 90%, PR 10%, HER-2 negative ratio 1.26, Ki-67 1%, T1CN0 stage I a  Adjuvant radiation therapy 03/27/2017-04/30/2017 Treatment plan: Adjuvant antiestrogen therapy with letrozole 2.5 mg daily x5 years  Letrozole counseling:We discussed the risks and benefits of anti-estrogen therapy with aromatase inhibitors. These include but not limited to insomnia, hot flashes, mood changes, vaginal dryness, bone density loss, and weight gain. We strongly believe that the benefits far outweigh the risks. Patient understands these risks and consented to starting treatment. Planned treatment duration is 5 years.  Return to clinic in 3 months for survivorship care plan visit

## 2017-04-30 NOTE — Progress Notes (Signed)
Patient Care Team: Asencion Noble, MD as PCP - General (Internal Medicine)  DIAGNOSIS:  Encounter Diagnosis  Name Primary?  . Malignant neoplasm of upper-inner quadrant of left breast in female, estrogen receptor positive ([place])     SUMMARY OF ONCOLOGIC HISTORY:   Malignant neoplasm of upper-inner quadrant of left breast in female, estrogen receptor positive ([place])   02/27/2017 Surgery    Left lumpectomy 02/27/2017: IDC grade 1, 1.1 cm, ALH, margins negative, 0/2 lymph nodes negative, ER 90%, PR 10%, HER-2 negative ratio 1.26, Ki-67 1%, T1CN0 stage I a       03/27/2017 - 04/30/2017 Radiation Therapy    Adjuvant radiation therapy      04/30/2017 -  Anti-estrogen oral therapy    Letrozole 2.5 mg daily x5 years       CHIEF COMPLIANT: Follow-up after adjuvant radiation  INTERVAL HISTORY: Breanna Hill is a 46-year with above-mentioned history of left breast cancer treated with lumpectomy and just completed adjuvant radiation therapy.  She is here today to discuss starting antiestrogen therapy with letrozole.  She appears to have tolerated the radiation therapy fairly well.  She had mild radiation dermatitis.  Also had mild fatigue.  REVIEW OF SYSTEMS:   Constitutional: Denies fevers, chills or abnormal weight loss Eyes: Denies blurriness of vision Ears, nose, mouth, throat, and face: Denies mucositis or sore throat Respiratory: Denies cough, dyspnea or wheezes Cardiovascular: Denies palpitation, chest discomfort Gastrointestinal:  Denies nausea, heartburn or change in bowel habits Skin: Denies abnormal skin rashes Lymphatics: Denies new lymphadenopathy or easy bruising Neurological:Denies numbness, tingling or new weaknesses Behavioral/Psych: Mood is stable, no new changes  Extremities: No lower extremity edema Breast: Mild radiation dermatitis All other systems were reviewed with the patient and are negative.  I have reviewed the past medical history, past surgical history,  social history and family history with the patient and they are unchanged from previous note.  ALLERGIES:  is allergic to penicillins.  MEDICATIONS:  Current Outpatient Medications  Medication Sig Dispense Refill  . aspirin EC 81 MG tablet Take 81 mg by mouth daily.    Marland Kitchen atorvastatin (LIPITOR) 20 MG tablet Take 20 mg by mouth every evening.     . escitalopram (LEXAPRO) 10 MG tablet Take 5 mg by mouth daily before breakfast.     . letrozole (FEMARA) 2.5 MG tablet Take 1 tablet (2.5 mg total) by mouth daily. 90 tablet 3  . levothyroxine (SYNTHROID, LEVOTHROID) 100 MCG tablet Take 100 mcg by mouth daily before breakfast.     . MYRBETRIQ 50 MG TB24 tablet Take 50 mg by mouth daily.      No current facility-administered medications for this visit.     PHYSICAL EXAMINATION: ECOG PERFORMANCE STATUS: 1 - Symptomatic but completely ambulatory  Vitals:   04/30/17 0854  BP: (!) 123/59  Pulse: 70  Resp: 17  Temp: 98 F (36.7 C)  SpO2: 96%   Filed Weights   04/30/17 0854  Weight: 171 lb 13.6 oz (78 kg)    GENERAL:alert, no distress and comfortable SKIN: skin color, texture, turgor are normal, no rashes or significant lesions EYES: normal, Conjunctiva are pink and non-injected, sclera clear OROPHARYNX:no exudate, no erythema and lips, buccal mucosa, and tongue normal  NECK: supple, thyroid normal size, non-tender, without nodularity LYMPH:  no palpable lymphadenopathy in the cervical, axillary or inguinal LUNGS: clear to auscultation and percussion with normal breathing effort HEART: regular rate & rhythm and no murmurs and no lower extremity edema ABDOMEN:abdomen  soft, non-tender and normal bowel sounds MUSCULOSKELETAL:no cyanosis of digits and no clubbing  NEURO: alert & oriented x 3 with fluent speech, no focal motor/sensory deficits EXTREMITIES: No lower extremity edema  LABORATORY DATA:  I have reviewed the data as listed CMP Latest Ref Rng & Units 02/21/2017 03/01/2014  02/28/2009  Glucose 65 - 99 mg/dL 134(H) 152(H) 122(H)  BUN 6 - 20 mg/dL 18 23 18   Creatinine 0.44 - 1.00 mg/dL 0.87 0.80 0.83  Sodium 135 - 145 mmol/L 138 138 138  Potassium 3.5 - 5.1 mmol/L 4.0 3.7 3.6  Chloride 101 - 111 mmol/L 102 105 101  CO2 22 - 32 mmol/L 26 27 29   Calcium 8.9 - 10.3 mg/dL 9.1 9.1 9.0  Total Protein 6.0 - 8.3 g/dL - 6.9 -  Total Bilirubin 0.3 - 1.2 mg/dL - 0.3 -  Alkaline Phos 39 - 117 U/L - 80 -  AST 0 - 37 U/L - 25 -  ALT 0 - 35 U/L - 27 -    Lab Results  Component Value Date   WBC 7.8 02/21/2017   HGB 14.5 02/21/2017   HCT 43.4 02/21/2017   MCV 93.5 02/21/2017   PLT 231 02/21/2017   NEUTROABS 4.4 03/01/2014    ASSESSMENT & PLAN:  Malignant neoplasm of upper-inner quadrant of left breast in female, estrogen receptor positive ([place]) Left lumpectomy 02/27/2017: IDC grade 1, 1.1 cm, ALH, margins negative, 0/2 lymph nodes negative, ER 90%, PR 10%, HER-2 negative ratio 1.26, Ki-67 1%, T1CN0 stage I a  Adjuvant radiation therapy 03/27/2017-04/30/2017 Treatment plan: Adjuvant antiestrogen therapy with letrozole 2.5 mg daily x5 years  Letrozole counseling:We discussed the risks and benefits of anti-estrogen therapy with aromatase inhibitors. These include but not limited to insomnia, hot flashes, mood changes, vaginal dryness, bone density loss, and weight gain. We strongly believe that the benefits far outweigh the risks. Patient understands these risks and consented to starting treatment. Planned treatment duration is 5 years.  Return to clinic in 3 months for survivorship care plan visit    No orders of the defined types were placed in this encounter.  The patient has a good understanding of the overall plan. she agrees with it. she will call with any problems that may develop before the next visit here.   Harriette Ohara, MD 04/30/17

## 2017-05-01 ENCOUNTER — Encounter: Payer: Self-pay | Admitting: Radiation Oncology

## 2017-05-01 NOTE — Progress Notes (Signed)
  Radiation Oncology         [phone] ________________________________  Name: Breanna Hill MRN: [id number]  Date: 05/01/2017  DOB: [birth year]  End of Treatment Note  Diagnosis:   Stage IA, pT1c pN0 M0 Left Breast UIQ Invasive Ductal Carcinoma, ER(+) / PR(+) / Her2(-), Grade 1     Indication for treatment:  Curative       Radiation treatment dates:   03/27/2017-04/23/2017  Site/dose:  1. Left breast, 2.67 Gy x 15 fractions for a total dose of 40.05 Gy          2. Boost, 2 Gy x 5 fractions for a total dose of 10 Gy  Beams/energy:   1. 3D, 6X//10X        2. Isodose plan, 6X//10X  Narrative: The patient tolerated radiation treatment relatively well. At the beginning of treatments, she denied pain. She was given radiaplex and instructed to use it BID. On PE, it was noted mild erythema to left breast. Towards the end of her treatments, she reported pruritis to left breast at the site of dermatitis. She was using radiaplex and hydrocortisone with no relief. She denied fatigue. On PE, it was noted mild erythema, with dermatitis in the UIQ of the left breast.   Plan: The patient has completed radiation treatment. The patient will return to radiation oncology clinic for routine followup in one month. I advised them to call or return sooner if they have any questions or concerns related to their recovery or treatment.  -----------------------------------  [name], MD  This document serves as a record of services personally performed by [name], MD. It was created on her behalf by Steva Colder, a trained medical scribe. The creation of this record is based on the scribe's personal observations and the provider's statements to them. This document has been checked and approved by the attending provider.

## 2017-05-03 DIAGNOSIS — F419 Anxiety disorder, unspecified: Secondary | ICD-10-CM | POA: Diagnosis not present

## 2017-05-03 DIAGNOSIS — R030 Elevated blood-pressure reading, without diagnosis of hypertension: Secondary | ICD-10-CM | POA: Diagnosis not present

## 2017-05-03 DIAGNOSIS — Z800 Family history of malignant neoplasm of digestive organs: Secondary | ICD-10-CM | POA: Diagnosis not present

## 2017-05-03 DIAGNOSIS — E785 Hyperlipidemia, unspecified: Secondary | ICD-10-CM | POA: Diagnosis not present

## 2017-05-03 DIAGNOSIS — R32 Unspecified urinary incontinence: Secondary | ICD-10-CM | POA: Diagnosis not present

## 2017-05-03 DIAGNOSIS — N3281 Overactive bladder: Secondary | ICD-10-CM | POA: Diagnosis not present

## 2017-05-03 DIAGNOSIS — M810 Age-related osteoporosis without current pathological fracture: Secondary | ICD-10-CM | POA: Diagnosis not present

## 2017-05-03 DIAGNOSIS — E039 Hypothyroidism, unspecified: Secondary | ICD-10-CM | POA: Diagnosis not present

## 2017-05-03 DIAGNOSIS — K5900 Constipation, unspecified: Secondary | ICD-10-CM | POA: Diagnosis not present

## 2017-05-03 DIAGNOSIS — R69 Illness, unspecified: Secondary | ICD-10-CM | POA: Diagnosis not present

## 2017-05-23 ENCOUNTER — Encounter: Payer: Self-pay | Admitting: Radiation Oncology

## 2017-05-23 NOTE — Progress Notes (Signed)
Breanna Hill presents for follow up of radiation completed 04/23/17 to her Left Breast. She has redness to her Left Breast. She reports itching near her scar and underneath her left breast. She is using hydrocortisone and triamcinolone Acetonide with some relief. She has completed radiaplex and was encouraged to use a lotion containing vitamin E. She received a tick bite yesterday to her left lateral breast. The area is red at this time.  She saw Dr. Lindi Adie on 04/30/17 and started Letrozole 2.5 mg daily. She has a appointment with survivorship on 07/29/17.   BP (!) 143/75 (BP Location: Left Arm, Patient Position: Sitting, Cuff Size: Normal)   Pulse 83   Temp 98.5 F (36.9 C) (Oral)   Resp 20   Ht 5' 3.5" (1.613 m)   Wt 170 lb 12.8 oz (77.5 kg)   SpO2 96%   BMI 29.78 kg/m    Wt Readings from Last 3 Encounters:  05/31/17 170 lb 12.8 oz (77.5 kg)  04/30/17 171 lb 13.6 oz (78 kg)  03/15/17 175 lb 3.2 oz (79.5 kg)

## 2017-05-31 ENCOUNTER — Encounter: Payer: Self-pay | Admitting: Radiation Oncology

## 2017-05-31 ENCOUNTER — Telehealth: Payer: Self-pay

## 2017-05-31 ENCOUNTER — Other Ambulatory Visit: Payer: Self-pay

## 2017-05-31 ENCOUNTER — Ambulatory Visit
Admission: RE | Admit: 2017-05-31 | Discharge: 2017-05-31 | Disposition: A | Discharge status: Home / Self Care | Payer: Medicare HMO | Source: Ambulatory Visit | Attending: Radiation Oncology | Admitting: Radiation Oncology

## 2017-05-31 VITALS — BP 143/75 | HR 83 | Temp 98.5°F | Resp 20 | Ht 63.5 in | Wt 170.8 lb

## 2017-05-31 DIAGNOSIS — Z923 Personal history of irradiation: Secondary | ICD-10-CM | POA: Insufficient documentation

## 2017-05-31 DIAGNOSIS — Z79811 Long term (current) use of aromatase inhibitors: Secondary | ICD-10-CM | POA: Diagnosis not present

## 2017-05-31 DIAGNOSIS — Z170 Estrogen receptor positive status [ER+]: Secondary | ICD-10-CM | POA: Diagnosis not present

## 2017-05-31 DIAGNOSIS — Z79899 Other long term (current) drug therapy: Secondary | ICD-10-CM | POA: Diagnosis not present

## 2017-05-31 DIAGNOSIS — W57XXXA Bitten or stung by nonvenomous insect and other nonvenomous arthropods, initial encounter: Secondary | ICD-10-CM | POA: Diagnosis not present

## 2017-05-31 DIAGNOSIS — C50212 Malignant neoplasm of upper-inner quadrant of left female breast: Secondary | ICD-10-CM

## 2017-05-31 DIAGNOSIS — Z7982 Long term (current) use of aspirin: Secondary | ICD-10-CM | POA: Diagnosis not present

## 2017-05-31 DIAGNOSIS — C50912 Malignant neoplasm of unspecified site of left female breast: Secondary | ICD-10-CM | POA: Diagnosis not present

## 2017-05-31 HISTORY — DX: Personal history of irradiation: Z92.3

## 2017-05-31 NOTE — Progress Notes (Signed)
Radiation Oncology         [phone] ________________________________  Name: Breanna Hill MRN: [id number]  Date: 05/31/2017  DOB: 05-Feb-1940  Follow-Up Visit Note  Outpatient  CC: Asencion Noble, MD  Donnie Mesa, MD  Diagnosis:      ICD-10-CM   1. 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  Narrative:  The patient returns today for routine follow-up.  She is doing well. She denies any new palpable lumps or bumps in her breasts. She continues to be followed by medical oncology. Her last mammogram was on 02/26/2017 it was BI-RADS.  She reports itching near her scar and underneath her left breast. She is using hydrocortisone and triamcinolone Acetonide with some relief. She has completed radiaplex and was encouraged to use a lotion containing vitamin E. She received a tick bite yesterday to her left lateral breast. The area is red at this time. Her husband removed the tick.  She saw Dr. Lindi Adie on 04/30/17 and started Letrozole 2.5 mg daily.                       ALLERGIES:  is allergic to penicillins.  Meds: Current Outpatient Medications  Medication Sig Dispense Refill  . aspirin EC 81 MG tablet Take 81 mg by mouth daily.    Marland Kitchen atorvastatin (LIPITOR) 20 MG tablet Take 20 mg by mouth every evening.     . escitalopram (LEXAPRO) 10 MG tablet Take 5 mg by mouth daily before breakfast.     . letrozole (FEMARA) 2.5 MG tablet Take 1 tablet (2.5 mg total) by mouth daily. 90 tablet 3  . levothyroxine (SYNTHROID, LEVOTHROID) 100 MCG tablet Take 100 mcg by mouth daily before breakfast.     . MYRBETRIQ 50 MG TB24 tablet Take 50 mg by mouth daily.     Marland Kitchen  triamcinolone cream (KENALOG) 0.1 % APPLY TO AFFECTED AREA TWICE A DAY AS NEEDED  1   No current facility-administered medications for this encounter.     Physical Findings: The patient is in no acute distress. Patient is alert and oriented.  height is 5' 3.5" (1.613 m) and weight is 170 lb 12.8 oz (77.5 kg). Her oral temperature is 98.5 F (36.9 C). Her blood pressure is 143/75 (abnormal) and her pulse is 83. Her respiration is 20 and oxygen saturation is 96%. . There is a little black spot on patients left lateral breast where tick bit, no bulls eye rash on her back. Skin has healed very nicely on her left breast from RT, with slight hyperpigmentation.    Lab Findings: Lab Results  Component Value Date   WBC 7.8 02/21/2017   HGB 14.5 02/21/2017   HCT 43.4 02/21/2017   MCV 93.5 02/21/2017   PLT 231 02/21/2017    @LASTCHEMISTRY @  Radiographic Findings: No results found.  Impression/Plan: This is a very pleasant woman with a history of left breast cancer.  She knows continue yearly mammography, and to continue to take her Antiestrogen pill. I will see her back as needed.  I encouraged her to call if she has any issues in  the interim. She will continue to follow up with Medical Oncology. She has an appointment with survivorship on 07/29/17. Patient seems to have remains of a tick appendage in her left breast, removal of the remains under sterile technique by me with tweezers was unsuccessful but we will call to make a referral to [facility] ASAP so that it may be removed. I also advised Mrs. Polsky to talk to her PCP this AM to see if a antibotic is warranted due to her recent tick bite.    _____________________________________   [name], MD  This document serves as a record of services personally performed by [name] MD. It was created on her behalf by Delton Coombes, a trained medical scribe. The creation of this record is based on the scribe's personal  observations and the provider's statements to them.

## 2017-05-31 NOTE — Telephone Encounter (Signed)
Breanna Hill was in the clinic today for follow up of breast radiation. Breanna Hill reported a tick that she found over her incision site yesterday. She attempted to remove it, but was not able to observe the area well. Dr. Isidore Moos felt like part of the tick might still be under her skin. She attempted to remove it with supplies in the radiation oncology clinic, but was unable to complete it successfully. Dr. Isidore Moos asked that I contact [place] surgery, Dr. Georgette Dover and see if she can be seen today for evaluation. I called and spoke to [facility] surgery and they have scheduled Breanna Hill to see their PA at 1:30 today. Breanna Hill is aware and will be present for that appointment.

## 2017-06-20 DIAGNOSIS — C50212 Malignant neoplasm of upper-inner quadrant of left female breast: Secondary | ICD-10-CM | POA: Diagnosis not present

## 2017-06-21 ENCOUNTER — Telehealth: Payer: Self-pay

## 2017-06-21 NOTE — Telephone Encounter (Signed)
Pt left VM asking for a prescription (didn't specify which one) to be sent to [facility] [phone]. Called pt back and left a VM on her phone letting her know that Dr. Isidore Moos was out of the office today and would return Monday if she could wait till then. Instructed her to call back if she needed the prescription filled before Monday.

## 2017-06-25 ENCOUNTER — Ambulatory Visit (INDEPENDENT_AMBULATORY_CARE_PROVIDER_SITE_OTHER): Payer: Medicare HMO | Admitting: Urology

## 2017-06-25 DIAGNOSIS — R351 Nocturia: Secondary | ICD-10-CM | POA: Diagnosis not present

## 2017-06-25 DIAGNOSIS — N3281 Overactive bladder: Secondary | ICD-10-CM | POA: Diagnosis not present

## 2017-07-01 DIAGNOSIS — L723 Sebaceous cyst: Secondary | ICD-10-CM | POA: Diagnosis not present

## 2017-07-01 DIAGNOSIS — L570 Actinic keratosis: Secondary | ICD-10-CM | POA: Diagnosis not present

## 2017-07-01 DIAGNOSIS — L309 Dermatitis, unspecified: Secondary | ICD-10-CM | POA: Diagnosis not present

## 2017-07-10 DIAGNOSIS — Z0100 Encounter for examination of eyes and vision without abnormal findings: Secondary | ICD-10-CM | POA: Diagnosis not present

## 2017-07-10 DIAGNOSIS — H5200 Hypermetropia, unspecified eye: Secondary | ICD-10-CM | POA: Diagnosis not present

## 2017-07-17 ENCOUNTER — Telehealth: Payer: Self-pay

## 2017-07-17 NOTE — Telephone Encounter (Signed)
Left message for pt reminding of SCP visit with NP on 07/29/17 at 10 am.  Left center number for call back with questions.

## 2017-07-24 DIAGNOSIS — R197 Diarrhea, unspecified: Secondary | ICD-10-CM | POA: Diagnosis not present

## 2017-07-29 ENCOUNTER — Telehealth: Payer: Self-pay | Admitting: Adult Health

## 2017-07-29 ENCOUNTER — Inpatient Hospital Stay: Payer: Medicare HMO | Attending: Hematology and Oncology | Admitting: Adult Health

## 2017-07-29 ENCOUNTER — Encounter: Payer: Self-pay | Admitting: Adult Health

## 2017-07-29 VITALS — BP 130/66 | HR 69 | Temp 98.9°F | Resp 18 | Ht 63.5 in | Wt 168.6 lb

## 2017-07-29 DIAGNOSIS — Z79811 Long term (current) use of aromatase inhibitors: Secondary | ICD-10-CM

## 2017-07-29 DIAGNOSIS — C50212 Malignant neoplasm of upper-inner quadrant of left female breast: Secondary | ICD-10-CM | POA: Diagnosis not present

## 2017-07-29 DIAGNOSIS — Z923 Personal history of irradiation: Secondary | ICD-10-CM | POA: Diagnosis not present

## 2017-07-29 DIAGNOSIS — Z79899 Other long term (current) drug therapy: Secondary | ICD-10-CM | POA: Diagnosis not present

## 2017-07-29 DIAGNOSIS — R7303 Prediabetes: Secondary | ICD-10-CM | POA: Diagnosis not present

## 2017-07-29 DIAGNOSIS — R232 Flushing: Secondary | ICD-10-CM | POA: Diagnosis not present

## 2017-07-29 DIAGNOSIS — K219 Gastro-esophageal reflux disease without esophagitis: Secondary | ICD-10-CM | POA: Diagnosis not present

## 2017-07-29 DIAGNOSIS — E7800 Pure hypercholesterolemia, unspecified: Secondary | ICD-10-CM

## 2017-07-29 DIAGNOSIS — E039 Hypothyroidism, unspecified: Secondary | ICD-10-CM | POA: Insufficient documentation

## 2017-07-29 DIAGNOSIS — M1990 Unspecified osteoarthritis, unspecified site: Secondary | ICD-10-CM | POA: Diagnosis not present

## 2017-07-29 DIAGNOSIS — Z170 Estrogen receptor positive status [ER+]: Secondary | ICD-10-CM | POA: Diagnosis not present

## 2017-07-29 DIAGNOSIS — E2839 Other primary ovarian failure: Secondary | ICD-10-CM

## 2017-07-29 NOTE — Progress Notes (Signed)
CLINIC:  Survivorship   REASON FOR VISIT:  Routine follow-up post-treatment for a recent history of breast cancer.  BRIEF ONCOLOGIC HISTORY:    Malignant neoplasm of upper-inner quadrant of left breast in female, estrogen receptor positive ([place])   02/27/2017 Surgery    Left lumpectomy 02/27/2017: IDC grade 1, 1.1 cm, ALH, margins negative, 0/2 lymph nodes negative, ER 90%, PR 10%, HER-2 negative ratio 1.26, Ki-67 1%, T1CN0 stage I a       03/27/2017 - 04/30/2017 Radiation Therapy    Adjuvant radiation therapy      04/30/2017 -  Anti-estrogen oral therapy    Letrozole 2.5 mg daily x5 years       INTERVAL HISTORY:  Breanna Hill presents to the [facility] today for our initial meeting to review her survivorship care plan detailing her treatment course for breast cancer, as well as monitoring long-term side effects of that treatment, education regarding health maintenance, screening, and overall wellness and health promotion.     Overall, Breanna Hill reports feeling quite well.  She is taking Letrozole daily.  She does experience some hot flashes.  These are not particularly bothersome, she is experiencing them for one to two times per day and the last a few minutes per occurrence.      REVIEW OF SYSTEMS:  Review of Systems  Constitutional: Negative for appetite change, chills, diaphoresis, fatigue and fever.  HENT:   Negative for hearing loss, lump/mass and trouble swallowing.   Eyes: Negative for eye problems and icterus.  Respiratory: Negative for chest tightness, cough and shortness of breath.   Cardiovascular: Negative for chest pain, leg swelling and palpitations.  Gastrointestinal: Negative for abdominal distention, abdominal pain, constipation, diarrhea and nausea.  Endocrine: Positive for hot flashes.  Genitourinary: Negative for difficulty urinating.   Skin: Negative for itching and rash.  Neurological: Negative for dizziness, extremity weakness, headaches and  numbness.  Hematological: Negative for adenopathy.  Psychiatric/Behavioral: Negative for depression. The patient is not nervous/anxious.   Breast: Denies any new nodularity, masses, tenderness, nipple changes, or nipple discharge.      ONCOLOGY TREATMENT TEAM:  1. Surgeon:  Dr. Georgette Dover at [facility] 2. Medical Oncologist: Dr. Lindi Adie  3. Radiation Oncologist: Dr. Isidore Moos    PAST MEDICAL/SURGICAL HISTORY:  Past Medical History:  Diagnosis Date  . Arthritis    R hand   . Cancer [facility])    breast CA- diagnosed with needle biopsy  . Depression   . GERD (gastroesophageal reflux disease)    rare use of tums   . High cholesterol 10/17/2015  . History of radiation therapy 03/27/17- 04/23/17   Left Breast, 2.67 Gy in 15 fractions for a total dose of 40.05 Gy. Boost, 2 Gy in 5 fractions for a total dose of 10 Gy.   Marland Kitchen Hypothyroidism   . Pre-diabetes    Hgba1C- in the 6 's , per pt., she monitors her diet   Past Surgical History:  Procedure Laterality Date  . [place]  . BREAST LUMPECTOMY WITH RADIOACTIVE SEED AND SENTINEL LYMPH NODE BIOPSY Left 02/27/2017   Procedure: LEFT BREAST LUMPECTOMY WITH RADIOACTIVE SEED AND LEFT AXILLARY SENTINEL LYMPH NODE BIOPSY;  Surgeon: Donnie Mesa, MD;  Location: [place];  Service: General;  Laterality: Left;  . COLONOSCOPY     X 2  . COLONOSCOPY N/A 02/22/2016   Procedure: COLONOSCOPY;  Surgeon: Rogene Houston, MD;  [facility];  Service: Endoscopy;  Laterality: N/A;  1:45  .  EYE SURGERY Bilateral    phlebpheroplasty  . Left shoultder surgery     for bone spurs & frozen shoulder   . Right bunionectomy    . TONSILLECTOMY    . TUBAL LIGATION       ALLERGIES:  Allergies  Allergen Reactions  . Penicillins Rash    Has patient had a PCN reaction causing immediate rash, facial/tongue/throat swelling, SOB or lightheadedness with hypotension: No Has patient had a PCN reaction causing severe rash involving mucus membranes  or skin necrosis: No Has patient had a PCN reaction that required hospitalization: No Has patient had a PCN reaction occurring within the last 10 years: No If all of the above answers are "NO", then may proceed with Cephalosporin use.      CURRENT MEDICATIONS:  Outpatient Encounter Medications as of 07/29/2017  Medication Sig  . atorvastatin (LIPITOR) 20 MG tablet Take 20 mg by mouth every evening.   . diphenoxylate-atropine (LOMOTIL) 2.5-0.025 MG tablet Take 1 tablet by mouth 2 (two) times daily.  Marland Kitchen escitalopram (LEXAPRO) 10 MG tablet Take 5 mg by mouth daily before breakfast.   . letrozole (FEMARA) 2.5 MG tablet Take 1 tablet (2.5 mg total) by mouth daily.  Marland Kitchen levothyroxine (SYNTHROID, LEVOTHROID) 100 MCG tablet Take 100 mcg by mouth daily before breakfast.   . MYRBETRIQ 50 MG TB24 tablet Take 50 mg by mouth daily.   Marland Kitchen triamcinolone cream (KENALOG) 0.1 % APPLY TO AFFECTED AREA TWICE A DAY AS NEEDED  . [DISCONTINUED] aspirin EC 81 MG tablet Take 81 mg by mouth daily.   No facility-administered encounter medications on file as of 07/29/2017.      ONCOLOGIC FAMILY HISTORY:  Family History  Problem Relation Age of Onset  . Colon cancer Neg Hx      GENETIC COUNSELING/TESTING: Not at this time  SOCIAL HISTORY:    Social History   Socioeconomic History  . Marital status: Married    Spouse name: Not on file  . Number of children: Not on file  . Years of education: Not on file  . Highest education level: Not on file  Occupational History  . Not on file  Social Needs  . Financial resource strain: Not on file  . Food insecurity:    Worry: Not on file    Inability: Not on file  . Transportation needs:    Medical: Not on file    Non-medical: Not on file  Tobacco Use  . Smoking status: Never Smoker  . Smokeless tobacco: Never Used  Substance and Sexual Activity  . Alcohol use: Yes    Comment: occasional  . Drug use: No  . Sexual activity: Not on file  Lifestyle  .  Physical activity:    Days per week: Not on file    Minutes per session: Not on file  . Stress: Not on file  Relationships  . Social connections:    Talks on phone: Not on file    Gets together: Not on file    Attends religious service: Not on file    Active member of club or organization: Not on file    Attends meetings of clubs or organizations: Not on file    Relationship status: Not on file  . Intimate partner violence:    Fear of current or ex partner: Not on file    Emotionally abused: Not on file    Physically abused: Not on file    Forced sexual activity: Not on file  Other Topics  Concern  . Not on file  Social History Narrative  . Not on file      PHYSICAL EXAMINATION:  Vital Signs:   Vitals:   07/29/17 0954  BP: 130/66  Pulse: 69  Resp: 18  Temp: 98.9 F (37.2 C)  SpO2: 94%   Filed Weights   07/29/17 0954  Weight: 168 lb 9.6 oz (76.5 kg)   General: Well-nourished, well-appearing female in no acute distress.  She is unaccompanied today.   HEENT: Head is normocephalic.  Pupils equal and reactive to light. Conjunctivae clear without exudate.  Sclerae anicteric. Oral mucosa is pink, moist.  Oropharynx is pink without lesions or erythema.  Lymph: No cervical, supraclavicular, or infraclavicular lymphadenopathy noted on palpation.  Cardiovascular: Regular rate and rhythm.Marland Kitchen Respiratory: Clear to auscultation bilaterally. Chest expansion symmetric; breathing non-labored.  Breasts: left breast s/p lumpectomy, radiation changes noted, no nodules or masses, right breast without nodules, masses, skin or nipple changes GI: Abdomen soft and round; non-tender, non-distended. Bowel sounds normoactive.  GU: Deferred.  Neuro: No focal deficits. Steady gait.  Psych: Mood and affect normal and appropriate for situation.  Extremities: No edema. MSK: No focal spinal tenderness to palpation.  Full range of motion in bilateral upper extremities Skin: Warm and dry.  LABORATORY  DATA:  None for this visit.  DIAGNOSTIC IMAGING:  None for this visit.      ASSESSMENT AND PLAN:  Ms.. Hill is a pleasant 77 y.o. female with Stage IA left breast invasive ductal carcinoma, ER+/PR+/HER2-, diagnosed in 02/2017, treated with lumpectomy, adjuvant radiation therapy, and anti-estrogen therapy with Letrozole beginning in 04/2017.  She presents to the Survivorship Clinic for our initial meeting and routine follow-up post-completion of treatment for breast cancer.    1. Stage IA left breast cancer:  Breanna Hill is continuing to recover from definitive treatment for breast cancer. She will follow-up with her medical oncologist, Dr. Lindi Adie in 6 months with history and physical exam per surveillance protocol.  She will continue her anti-estrogen therapy with Letrozole daily. Thus far, she is tolerating the Letrozole well, with minimal side effects. She was instructed to make Dr. Lindi Adie or myself aware if she begins to experience any worsening side effects of the medication and I could see her back in clinic to help manage those side effects, as needed. Today, a comprehensive survivorship care plan and treatment summary was reviewed with the patient today detailing her breast cancer diagnosis, treatment course, potential late/long-term effects of treatment, appropriate follow-up care with recommendations for the future, and patient education resources.  A copy of this summary, along with a letter will be sent to the patient's primary care provider via mail/fax/In Basket message after today's visit.    2. Hot flashes: Not particularly bothersome.  I reviewed the section in her survivorship care plan that details hot flashes and interventions, both non pharmacologic and pharmacologic.  For now we will monitor, as these are brief and tolerable.    3. Bone health:  Given Breanna Hill's age/history of breast cancer and her current treatment regimen including anti-estrogen therapy with letrozole,  she is at risk for bone demineralization.  She is due for a repeat bone density test today and I ordered this for her.  In the meantime, she was encouraged to increase her consumption of foods rich in calcium, as well as increase her weight-bearing activities.  She was given education on specific activities to promote bone health.  4. Cancer screening:  Due to Breanna Hill's history  and her age, she should receive screening for skin cancers, colon cancer, and gynecologic cancers.  The information and recommendations are listed on the patient's comprehensive care plan/treatment summary and were reviewed in detail with the patient.    5. Health maintenance and wellness promotion: Breanna Hill was encouraged to consume 5-7 servings of fruits and vegetables per day. We reviewed the "Nutrition Rainbow" handout, as well as the handout "Take Control of Your Health and Reduce Your Cancer Risk" from the [employer].  She was also encouraged to engage in moderate to vigorous exercise for 30 minutes per day most days of the week. We discussed the [employer] fitness program, which is designed for cancer survivors to help them become more physically fit after cancer treatments.  She was instructed to limit her alcohol consumption and continue to abstain from tobacco use.      6. Support services/counseling: It is not uncommon for this period of the patient's cancer care trajectory to be one of many emotions and stressors.  We discussed an opportunity for her to participate in the next session of [facility] ("Finding Your New Normal") support group series designed for patients after they have completed treatment.   Breanna Hill was encouraged to take advantage of our many other support services programs, support groups, and/or counseling in coping with her new life as a cancer survivor after completing anti-cancer treatment.  She was offered support today through active listening and expressive supportive  counseling.  She was given information regarding our available services and encouraged to contact me with any questions or for help enrolling in any of our support group/programs.    Dispo:   -Return to cancer center in 6 months for f/u with Dr. Lindi Adie -Mammogram due in 01/2018 -Bone density due -Follow up with Dr. Georgette Dover -She is welcome to return back to the Survivorship Clinic at any time; no additional follow-up needed at this time.  -Consider referral back to survivorship as a long-term survivor for continued surveillance  A total of (30) minutes of face-to-face time was spent with this patient with greater than 50% of that time in counseling and care-coordination.   Gardenia Phlegm, NP Survivorship Program [facility] [phone]   Note: PRIMARY CARE PROVIDER Asencion Noble, [place] [phone]

## 2017-07-29 NOTE — Telephone Encounter (Signed)
Per 7/29 los.  Gave patient AVS and calendar.

## 2017-08-01 DIAGNOSIS — D485 Neoplasm of uncertain behavior of skin: Secondary | ICD-10-CM | POA: Diagnosis not present

## 2017-08-01 DIAGNOSIS — L720 Epidermal cyst: Secondary | ICD-10-CM | POA: Diagnosis not present

## 2017-08-01 DIAGNOSIS — L905 Scar conditions and fibrosis of skin: Secondary | ICD-10-CM | POA: Diagnosis not present

## 2017-08-01 DIAGNOSIS — D225 Melanocytic nevi of trunk: Secondary | ICD-10-CM | POA: Diagnosis not present

## 2017-08-07 DIAGNOSIS — R197 Diarrhea, unspecified: Secondary | ICD-10-CM | POA: Diagnosis not present

## 2017-09-30 ENCOUNTER — Ambulatory Visit (INDEPENDENT_AMBULATORY_CARE_PROVIDER_SITE_OTHER): Payer: Medicare HMO | Admitting: Internal Medicine

## 2017-09-30 ENCOUNTER — Encounter (INDEPENDENT_AMBULATORY_CARE_PROVIDER_SITE_OTHER): Payer: Self-pay | Admitting: Internal Medicine

## 2017-09-30 VITALS — BP 152/80 | HR 72 | Temp 98.8°F | Ht 63.0 in | Wt 165.0 lb

## 2017-09-30 DIAGNOSIS — R197 Diarrhea, unspecified: Secondary | ICD-10-CM

## 2017-09-30 NOTE — Patient Instructions (Signed)
Imodium twice a day. Fiber 4 gm po daily

## 2017-09-30 NOTE — Progress Notes (Signed)
Subjective:    Patient ID: Breanna Hill, female    DOB: 05-26-1940, 77 y.o.   MRN: [id number]  HPI Here today with c/o diarrhea. She says she has had diarrhea since July. No abdominal pain. Does not make her feel bad. She says it is just a nuisance. She is having a BM 5-6 a day. First stool in the am is normal, then the rest are loose. Stool studies 08/07/2017 were negative (c-diff negative) She was last seen in 2017 with c/o diarrhea.  She was advised to take Imodium BID. Colonoscopy in 2018 which was normal.  (GI pathogen order in 2017 but she did not obtain specimen).   Review of Systems Past Medical History:  Diagnosis Date  . Arthritis    R hand   . Cancer [facility])    breast CA- diagnosed with needle biopsy  . Depression   . GERD (gastroesophageal reflux disease)    rare use of tums   . High cholesterol 10/17/2015  . History of radiation therapy 03/27/17- 04/23/17   Left Breast, 2.67 Gy in 15 fractions for a total dose of 40.05 Gy. Boost, 2 Gy in 5 fractions for a total dose of 10 Gy.   Marland Kitchen Hypothyroidism   . Pre-diabetes    Hgba1C- in the 6 's , per pt., she monitors her diet    Past Surgical History:  Procedure Laterality Date  . [place]  . BREAST LUMPECTOMY WITH RADIOACTIVE SEED AND SENTINEL LYMPH NODE BIOPSY Left 02/27/2017   Procedure: LEFT BREAST LUMPECTOMY WITH RADIOACTIVE SEED AND LEFT AXILLARY SENTINEL LYMPH NODE BIOPSY;  Surgeon: Donnie Mesa, MD;  Location: [place];  Service: General;  Laterality: Left;  . COLONOSCOPY     X 2  . COLONOSCOPY N/A 02/22/2016   Procedure: COLONOSCOPY;  Surgeon: Rogene Houston, MD;  [facility];  Service: Endoscopy;  Laterality: N/A;  1:45  . EYE SURGERY Bilateral    phlebpheroplasty  . Left shoultder surgery     for bone spurs & frozen shoulder   . Right bunionectomy    . TONSILLECTOMY    . TUBAL LIGATION      Allergies  Allergen Reactions  . Penicillins Rash    Has patient had a PCN reaction causing  immediate rash, facial/tongue/throat swelling, SOB or lightheadedness with hypotension: No Has patient had a PCN reaction causing severe rash involving mucus membranes or skin necrosis: No Has patient had a PCN reaction that required hospitalization: No Has patient had a PCN reaction occurring within the last 10 years: No If all of the above answers are "NO", then may proceed with Cephalosporin use.     Current Outpatient Medications on File Prior to Visit  Medication Sig Dispense Refill  . acetaminophen (TYLENOL) 325 MG tablet Take 650 mg by mouth every 6 (six) hours as needed.    Marland Kitchen atorvastatin (LIPITOR) 20 MG tablet Take 20 mg by mouth every evening.     . escitalopram (LEXAPRO) 10 MG tablet Take 5 mg by mouth daily before breakfast.     . letrozole (FEMARA) 2.5 MG tablet Take 1 tablet (2.5 mg total) by mouth daily. 90 tablet 3  . levothyroxine (SYNTHROID, LEVOTHROID) 100 MCG tablet Take 100 mcg by mouth daily before breakfast.     . Melatonin 10 MG CAPS Take by mouth.    Marland Kitchen MYRBETRIQ 50 MG TB24 tablet Take 50 mg by mouth daily.     . Trospium Chloride 60 MG CP24  Take by mouth.     No current facility-administered medications on file prior to visit.         Objective:   Physical Exam Blood pressure (!) 152/80, pulse 72, temperature 98.8 F (37.1 C), height 5\' 3"  (1.6 m), weight 165 lb (74.8 kg). Alert and oriented. Skin warm and dry. Oral mucosa is moist.   . Sclera anicteric, conjunctivae is pink. Thyroid not enlarged. No cervical lymphadenopathy. Lungs clear. Heart regular rate and rhythm.  Abdomen is soft. Bowel sounds are positive. No hepatomegaly. No abdominal masses felt. No tenderness.  No edema to lower extremities.           Assessment & Plan:  Diarrhea. Fiber 4 gms po daily. Imodium twice a day.  OV as needed.

## 2017-10-09 ENCOUNTER — Telehealth (INDEPENDENT_AMBULATORY_CARE_PROVIDER_SITE_OTHER): Payer: Self-pay | Admitting: Internal Medicine

## 2017-10-09 NOTE — Telephone Encounter (Signed)
No answer. Will call tomorrow. Message left.

## 2017-10-09 NOTE — Telephone Encounter (Signed)
Patient would like for you to call her regarding her medication - ph# [phone]

## 2017-10-29 DIAGNOSIS — R69 Illness, unspecified: Secondary | ICD-10-CM | POA: Diagnosis not present

## 2017-11-01 DIAGNOSIS — C50912 Malignant neoplasm of unspecified site of left female breast: Secondary | ICD-10-CM | POA: Diagnosis not present

## 2018-01-28 NOTE — Progress Notes (Signed)
Patient Care Team: Asencion Noble, MD as PCP - General (Internal Medicine) Nicholas Lose, MD as Consulting Physician (Hematology and Oncology) [name], MD as Attending Physician (Radiation Oncology) Donnie Mesa, MD as Consulting Physician (General Surgery) Gardenia Phlegm, NP as Nurse Practitioner (Hematology and Oncology)  DIAGNOSIS:    ICD-10-CM   1. Malignant neoplasm of upper-inner quadrant of left breast in female, estrogen receptor positive ([place]) C50.212 MM DIAG BREAST TOMO BILATERAL   Z17.0     SUMMARY OF ONCOLOGIC HISTORY:   Malignant neoplasm of upper-inner quadrant of left breast in female, estrogen receptor positive ([place])   02/27/2017 Surgery    Left lumpectomy 02/27/2017: IDC grade 1, 1.1 cm, ALH, margins negative, 0/2 lymph nodes negative, ER 90%, PR 10%, HER-2 negative ratio 1.26, Ki-67 1%, T1CN0 stage I a     03/27/2017 - 04/30/2017 Radiation Therapy    Adjuvant radiation therapy    04/30/2017 -  Anti-estrogen oral therapy    Letrozole 2.5 mg daily x5 years     CHIEF COMPLIANT: Follow-up of letrozole therapy  INTERVAL HISTORY: Breanna Hill is a 78 y.o. with above-mentioned history of left breast cancer treated with lumpectomy, adjuvant radiation therapy, and is currently on anti-estrogen therapy with letrozole. She presents to the clinic alone today and reports occasional hot flashes and joint stiffness in her knees and hands that improves with exercise and is worse at night and in the morning. She uses hemp oil at night to help with difficulty sleeping. She reports her husband has Parkinson's disease and she is his primary caregiver. She had a breast exam done by Wilber Bihari, NP at her last appointment in 07/2017. She reviewed her medication list with me.   REVIEW OF SYSTEMS:   Constitutional: Denies fevers, chills or abnormal weight loss (+) occasional hot flashes Eyes: Denies blurriness of vision Ears, nose, mouth, throat, and face: Denies  mucositis or sore throat Respiratory: Denies cough, dyspnea or wheezes Cardiovascular: Denies palpitation, chest discomfort Gastrointestinal: Denies nausea, heartburn or change in bowel habits Skin: Denies abnormal skin rashes MSK: (+) joint stiffness in hands and knees Lymphatics: Denies new lymphadenopathy or easy bruising Neurological: Denies numbness, tingling or new weaknesses Behavioral/Psych: Mood is stable, no new changes (+) difficulty sleeping Extremities: No lower extremity edema Breast: denies any pain or lumps or nodules in either breasts All other systems were reviewed with the patient and are negative.  I have reviewed the past medical history, past surgical history, social history and family history with the patient and they are unchanged from previous note.  ALLERGIES:  is allergic to penicillins.  MEDICATIONS:  Current Outpatient Medications  Medication Sig Dispense Refill  . acetaminophen (TYLENOL) 325 MG tablet Take 650 mg by mouth every 6 (six) hours as needed.    Marland Kitchen atorvastatin (LIPITOR) 20 MG tablet Take 20 mg by mouth every evening.     . escitalopram (LEXAPRO) 10 MG tablet Take 5 mg by mouth daily before breakfast.     . letrozole (FEMARA) 2.5 MG tablet Take 1 tablet (2.5 mg total) by mouth daily. 90 tablet 3  . levothyroxine (SYNTHROID, LEVOTHROID) 100 MCG tablet Take 100 mcg by mouth daily before breakfast.     . Trospium Chloride 60 MG CP24 Take by mouth.     No current facility-administered medications for this visit.     PHYSICAL EXAMINATION: ECOG PERFORMANCE STATUS: 1 - Symptomatic but completely ambulatory  Vitals:   01/29/18 0914  BP: (!) 147/55  Pulse: 64  Resp: 18  Temp: 98.7 F (37.1 C)  SpO2: 98%   Filed Weights   01/29/18 0914  Weight: 164 lb 12.8 oz (74.8 kg)    GENERAL: alert, no distress and comfortable SKIN: skin color, texture, turgor are normal, no rashes or significant lesions EYES: normal, Conjunctiva are pink and  non-injected, sclera clear OROPHARYNX: no exudate, no erythema and lips, buccal mucosa, and tongue normal  NECK: supple, thyroid normal size, non-tender, without nodularity LYMPH: no palpable lymphadenopathy in the cervical, axillary or inguinal LUNGS: clear to auscultation and percussion with normal breathing effort HEART: regular rate & rhythm and no murmurs and no lower extremity edema ABDOMEN: abdomen soft, non-tender and normal bowel sounds MUSCULOSKELETAL: no cyanosis of digits and no clubbing  NEURO: alert & oriented x 3 with fluent speech, no focal motor/sensory deficits EXTREMITIES: No lower extremity edema  LABORATORY DATA:  I have reviewed the data as listed CMP Latest Ref Rng & Units 02/21/2017 03/01/2014 02/28/2009  Glucose 65 - 99 mg/dL 134(H) 152(H) 122(H)  BUN 6 - 20 mg/dL 18 23 18   Creatinine 0.44 - 1.00 mg/dL 0.87 0.80 0.83  Sodium 135 - 145 mmol/L 138 138 138  Potassium 3.5 - 5.1 mmol/L 4.0 3.7 3.6  Chloride 101 - 111 mmol/L 102 105 101  CO2 22 - 32 mmol/L 26 27 29   Calcium 8.9 - 10.3 mg/dL 9.1 9.1 9.0  Total Protein 6.0 - 8.3 g/dL - 6.9 -  Total Bilirubin 0.3 - 1.2 mg/dL - 0.3 -  Alkaline Phos 39 - 117 U/L - 80 -  AST 0 - 37 U/L - 25 -  ALT 0 - 35 U/L - 27 -    Lab Results  Component Value Date   WBC 7.8 02/21/2017   HGB 14.5 02/21/2017   HCT 43.4 02/21/2017   MCV 93.5 02/21/2017   PLT 231 02/21/2017   NEUTROABS 4.4 03/01/2014    ASSESSMENT & PLAN:  Malignant neoplasm of upper-inner quadrant of left breast in female, estrogen receptor positive ([facility]) Left lumpectomy 02/27/2017: IDC grade 1, 1.1 cm, ALH, margins negative, 0/2 lymph nodes negative, ER 90%, PR 10%, HER-2 negative ratio 1.26, Ki-67 1%, T1CN0 stage I a  Adjuvant radiation therapy 03/27/2017-04/30/2017 Treatment plan: Adjuvant antiestrogen therapy with letrozole 2.5 mg daily x5 years started 04/30/2017  Letrozole toxicities: 1.  Occasional hot flashes 2.  Mild joint stiffness especially in the  hands  Right knee arthritis: I encouraged her to take turmeric  Breast cancer surveillance: 1.  Mammogram has been ordered  2.  Breast exam July 2019: Benign 3.  Bone density will be ordered to be done along with her mammogram.  Return to clinic in 1 year for follow-up    Orders Placed This Encounter  Procedures  . MM DIAG BREAST TOMO BILATERAL    Standing Status:   Future    Standing Expiration Date:   01/30/2019    Order Specific Question:   Reason for Exam (SYMPTOM  OR DIAGNOSIS REQUIRED)    Answer:   H/o breast cancer annual follow up    Order Specific Question:   Preferred imaging location?    Answer:   [facility]   The patient has a good understanding of the overall plan. she agrees with it. she will call with any problems that may develop before the next visit here.  Nicholas Lose, MD 01/29/2018  Julious Oka Dorshimer am acting as scribe for Dr. Nicholas Lose.  I have reviewed the above documentation for accuracy  and completeness, and I agree with the above.

## 2018-01-29 ENCOUNTER — Telehealth: Payer: Self-pay | Admitting: Hematology and Oncology

## 2018-01-29 ENCOUNTER — Inpatient Hospital Stay: Payer: Medicare HMO | Attending: Hematology and Oncology | Admitting: Hematology and Oncology

## 2018-01-29 DIAGNOSIS — Z923 Personal history of irradiation: Secondary | ICD-10-CM | POA: Insufficient documentation

## 2018-01-29 DIAGNOSIS — C50212 Malignant neoplasm of upper-inner quadrant of left female breast: Secondary | ICD-10-CM | POA: Insufficient documentation

## 2018-01-29 DIAGNOSIS — Z170 Estrogen receptor positive status [ER+]: Secondary | ICD-10-CM | POA: Diagnosis not present

## 2018-01-29 DIAGNOSIS — Z79811 Long term (current) use of aromatase inhibitors: Secondary | ICD-10-CM

## 2018-01-29 DIAGNOSIS — Z79899 Other long term (current) drug therapy: Secondary | ICD-10-CM | POA: Diagnosis not present

## 2018-01-29 MED ORDER — LETROZOLE 2.5 MG PO TABS: 2.5000 mg | ORAL_TABLET | Freq: Every day | ORAL | 3 refills | Status: DC

## 2018-01-29 NOTE — Assessment & Plan Note (Signed)
Left lumpectomy 02/27/2017: IDC grade 1, 1.1 cm, ALH, margins negative, 0/2 lymph nodes negative, ER 90%, PR 10%, HER-2 negative ratio 1.26, Ki-67 1%, T1CN0 stage I a  Adjuvant radiation therapy 03/27/2017-04/30/2017 Treatment plan: Adjuvant antiestrogen therapy with letrozole 2.5 mg daily x5 years started 04/30/2017  Letrozole toxicities:  Breast cancer surveillance: 1.  Mammogram has been ordered  2.  Breast exam July 2019: Benign  Return to clinic in 1 year for follow-up

## 2018-01-29 NOTE — Telephone Encounter (Signed)
Gave avs and calendar ° °

## 2018-02-03 DIAGNOSIS — M9903 Segmental and somatic dysfunction of lumbar region: Secondary | ICD-10-CM | POA: Diagnosis not present

## 2018-02-03 DIAGNOSIS — M9906 Segmental and somatic dysfunction of lower extremity: Secondary | ICD-10-CM | POA: Diagnosis not present

## 2018-02-03 DIAGNOSIS — M545 Low back pain: Secondary | ICD-10-CM | POA: Diagnosis not present

## 2018-02-03 DIAGNOSIS — M9902 Segmental and somatic dysfunction of thoracic region: Secondary | ICD-10-CM | POA: Diagnosis not present

## 2018-02-03 DIAGNOSIS — M9901 Segmental and somatic dysfunction of cervical region: Secondary | ICD-10-CM | POA: Diagnosis not present

## 2018-02-03 DIAGNOSIS — M25561 Pain in right knee: Secondary | ICD-10-CM | POA: Diagnosis not present

## 2018-02-03 DIAGNOSIS — M542 Cervicalgia: Secondary | ICD-10-CM | POA: Diagnosis not present

## 2018-02-03 DIAGNOSIS — M546 Pain in thoracic spine: Secondary | ICD-10-CM | POA: Diagnosis not present

## 2018-03-31 ENCOUNTER — Inpatient Hospital Stay: Admission: RE | Admit: 2018-03-31 | Payer: Medicare HMO | Source: Ambulatory Visit

## 2018-05-30 ENCOUNTER — Telehealth: Payer: Self-pay | Admitting: Adult Health

## 2018-05-30 NOTE — Telephone Encounter (Signed)
Patient called, stated that she found a lump/bump on her vagina, months ago, it's gotten bigger.  She will be a new patient.  Please advise what type of visit to schedule.  [phone]

## 2018-05-30 NOTE — Telephone Encounter (Signed)
Left message letting pt know this would require an in person visit. Advised to call back and schedule an appt. [place]

## 2018-06-11 ENCOUNTER — Encounter: Payer: Self-pay | Admitting: Adult Health

## 2018-06-11 ENCOUNTER — Other Ambulatory Visit: Payer: Self-pay

## 2018-06-11 ENCOUNTER — Ambulatory Visit (INDEPENDENT_AMBULATORY_CARE_PROVIDER_SITE_OTHER): Payer: Medicare HMO | Admitting: Adult Health

## 2018-06-11 VITALS — BP 150/78 | HR 75 | Ht 62.0 in | Wt 165.0 lb

## 2018-06-11 DIAGNOSIS — L720 Epidermal cyst: Secondary | ICD-10-CM | POA: Diagnosis not present

## 2018-06-11 NOTE — Patient Instructions (Signed)
Epidermal Cyst    An epidermal cyst is a sac made of skin tissue. The sac contains a substance called keratin. Keratin is a protein that is normally secreted through the hair follicles. When keratin becomes trapped in the top layer of skin (epidermis), it can form an epidermal cyst.  Epidermal cysts can be found anywhere on your body. These cysts are usually harmless (benign), and they may not cause symptoms unless they become infected.  What are the causes?  This condition may be caused by:   A blocked hair follicle.   A hair that curls and re-enters the skin instead of growing straight out of the skin (ingrown hair).   A blocked pore.   Irritated skin.   An injury to the skin.   Certain conditions that are passed along from parent to child (inherited).   Human papillomavirus (HPV).   Long-term (chronic) sun damage to the skin.  What increases the risk?  The following factors may make you more likely to develop an epidermal cyst:   Having acne.   Being overweight.   Being 30-40 years old.  What are the signs or symptoms?  The only symptom of this condition may be a small, painless lump underneath the skin. When an epidermal cyst ruptures, it may become infected. Symptoms may include:   Redness.   Inflammation.   Tenderness.   Warmth.   Fever.   Keratin draining from the cyst. Keratin is grayish-white, bad-smelling substance.   Pus draining from the cyst.  How is this diagnosed?  This condition is diagnosed with a physical exam.   In some cases, you may have a sample of tissue (biopsy) taken from your cyst to be examined under a microscope or tested for bacteria.   You may be referred to a health care provider who specializes in skin care (dermatologist).  How is this treated?  In many cases, epidermal cysts go away on their own without treatment. If a cyst becomes infected, treatment may include:   Opening and draining the cyst, done by a health care provider. After draining, minor surgery to  remove the rest of the cyst may be done.   Antibiotic medicine.   Injections of medicines (steroids) that help to reduce inflammation.   Surgery to remove the cyst. Surgery may be done if the cyst:  ? Becomes large.  ? Bothers you.  ? Has a chance of turning into cancer.   Do not try to open a cyst yourself.  Follow these instructions at home:   Take over-the-counter and prescription medicines only as told by your health care provider.   If you were prescribed an antibiotic medicine, take it it as told by your health care provider. Do not stop using the antibiotic even if you start to feel better.   Keep the area around your cyst clean and dry.   Wear loose, dry clothing.   Avoid touching your cyst.   Check your cyst every day for signs of infection. Check for:  ? Redness, swelling, or pain.  ? Fluid or blood.  ? Warmth.  ? Pus or a bad smell.   Keep all follow-up visits as told by your health care provider. This is important.  How is this prevented?   Wear clean, dry, clothing.   Avoid wearing tight clothing.   Keep your skin clean and dry. Take showers or baths every day.  Contact a health care provider if:   Your cyst develops symptoms of infection.     Your condition is not improving or is getting worse.   You develop a cyst that looks different from other cysts you have had.   You have a fever.  Get help right away if:   Redness spreads from the cyst into the surrounding area.  Summary   An epidermal cyst is a sac made of skin tissue. These cysts are usually harmless (benign), and they may not cause symptoms unless they become infected.   If a cyst becomes infected, treatment may include surgery to open and drain the cyst, or to remove it. Treatment may also include medicines by mouth or through an injection.   Take over-the-counter and prescription medicines only as told by your health care provider. If you were prescribed an antibiotic medicine, take it as told by your health care  provider. Do not stop using the antibiotic even if you start to feel better.   Contact a health care provider if your condition is not improving or is getting worse.   Keep all follow-up visits as told by your health care provider. This is important.  This information is not intended to replace advice given to you by your health care provider. Make sure you discuss any questions you have with your health care provider.  Document Released: 11/19/2003 Document Revised: 07/01/2017 Document Reviewed: 07/01/2017  Elsevier Interactive Patient Education  2019 [employer]

## 2018-06-11 NOTE — Progress Notes (Signed)
Patient ID: Breanna Hill, female   DOB: 1940/05/16, 78 y.o.   MRN: [id number] History of Present Illness:  Breanna Hill is a 78 year old white female, married, PM in complaining of mass left labia that is growing bigger, no pain. She has history of having blackheads that have had to be excised in the past. PCP is Dr Willey Blade.   Current Medications, Allergies, Past Medical History, Past Surgical History, Family History and Social History were reviewed in [employer] record.     Review of Systems: Has mass left labia that is growing bigger Denies any pain    Physical Exam:BP (!) 150/78 (BP Location: Left Arm, Patient Position: Sitting, Cuff Size: Normal)   Pulse 75   Ht 5\' 2"  (1.575 m)   Wt 165 lb (74.8 kg)   BMI 30.18 kg/m  General:  Well developed, well nourished, no acute distress Skin:  Warm and dry Pelvic:  External genitalia has about 2 cm epidermal cyst left labia, non tender. Psych:  No mood changes, alert and cooperative,seems happy Fall risk is low. PHQ 2 score 0.  Showed her pictures of epidermal cysts in genital dermatology atlas,and she wants it removed.  Examination chaperoned by Estill Bamberg Rash LPN.   Impression: 1. Epidermal cyst       Plan: Review handout on epidermal cyst Return 6/30 for removal of cyst

## 2018-07-01 ENCOUNTER — Ambulatory Visit: Payer: Medicare HMO | Admitting: Adult Health

## 2018-07-01 ENCOUNTER — Ambulatory Visit (INDEPENDENT_AMBULATORY_CARE_PROVIDER_SITE_OTHER): Payer: Medicare HMO | Admitting: Urology

## 2018-07-01 DIAGNOSIS — R351 Nocturia: Secondary | ICD-10-CM

## 2018-07-01 DIAGNOSIS — N3281 Overactive bladder: Secondary | ICD-10-CM

## 2018-07-02 DIAGNOSIS — L819 Disorder of pigmentation, unspecified: Secondary | ICD-10-CM | POA: Diagnosis not present

## 2018-07-02 DIAGNOSIS — D485 Neoplasm of uncertain behavior of skin: Secondary | ICD-10-CM | POA: Diagnosis not present

## 2018-07-02 DIAGNOSIS — L821 Other seborrheic keratosis: Secondary | ICD-10-CM | POA: Diagnosis not present

## 2018-07-02 DIAGNOSIS — L570 Actinic keratosis: Secondary | ICD-10-CM | POA: Diagnosis not present

## 2018-07-02 DIAGNOSIS — D1801 Hemangioma of skin and subcutaneous tissue: Secondary | ICD-10-CM | POA: Diagnosis not present

## 2018-07-02 DIAGNOSIS — L720 Epidermal cyst: Secondary | ICD-10-CM | POA: Diagnosis not present

## 2018-09-15 DIAGNOSIS — I872 Venous insufficiency (chronic) (peripheral): Secondary | ICD-10-CM | POA: Diagnosis not present

## 2018-09-15 DIAGNOSIS — E039 Hypothyroidism, unspecified: Secondary | ICD-10-CM | POA: Diagnosis not present

## 2018-09-15 DIAGNOSIS — E1169 Type 2 diabetes mellitus with other specified complication: Secondary | ICD-10-CM | POA: Diagnosis not present

## 2018-09-24 DIAGNOSIS — Z79899 Other long term (current) drug therapy: Secondary | ICD-10-CM | POA: Diagnosis not present

## 2018-09-24 DIAGNOSIS — I872 Venous insufficiency (chronic) (peripheral): Secondary | ICD-10-CM | POA: Diagnosis not present

## 2018-09-24 DIAGNOSIS — E1169 Type 2 diabetes mellitus with other specified complication: Secondary | ICD-10-CM | POA: Diagnosis not present

## 2018-09-24 DIAGNOSIS — E039 Hypothyroidism, unspecified: Secondary | ICD-10-CM | POA: Diagnosis not present

## 2018-10-27 ENCOUNTER — Other Ambulatory Visit: Payer: Self-pay

## 2018-10-27 ENCOUNTER — Ambulatory Visit
Admission: RE | Admit: 2018-10-27 | Discharge: 2018-10-27 | Disposition: A | Discharge status: Home / Self Care | Payer: Medicare HMO | Source: Ambulatory Visit | Attending: Hematology and Oncology | Admitting: Hematology and Oncology

## 2018-10-27 DIAGNOSIS — R928 Other abnormal and inconclusive findings on diagnostic imaging of breast: Secondary | ICD-10-CM | POA: Diagnosis not present

## 2018-10-27 DIAGNOSIS — Z170 Estrogen receptor positive status [ER+]: Secondary | ICD-10-CM

## 2018-10-27 DIAGNOSIS — C50212 Malignant neoplasm of upper-inner quadrant of left female breast: Secondary | ICD-10-CM

## 2018-10-31 DIAGNOSIS — C50912 Malignant neoplasm of unspecified site of left female breast: Secondary | ICD-10-CM | POA: Diagnosis not present

## 2018-12-01 DIAGNOSIS — M545 Low back pain: Secondary | ICD-10-CM | POA: Diagnosis not present

## 2018-12-01 DIAGNOSIS — M9903 Segmental and somatic dysfunction of lumbar region: Secondary | ICD-10-CM | POA: Diagnosis not present

## 2018-12-01 DIAGNOSIS — M9902 Segmental and somatic dysfunction of thoracic region: Secondary | ICD-10-CM | POA: Diagnosis not present

## 2018-12-01 DIAGNOSIS — M546 Pain in thoracic spine: Secondary | ICD-10-CM | POA: Diagnosis not present

## 2018-12-01 DIAGNOSIS — M9905 Segmental and somatic dysfunction of pelvic region: Secondary | ICD-10-CM | POA: Diagnosis not present

## 2018-12-03 DIAGNOSIS — R32 Unspecified urinary incontinence: Secondary | ICD-10-CM | POA: Diagnosis not present

## 2018-12-03 DIAGNOSIS — N3281 Overactive bladder: Secondary | ICD-10-CM | POA: Diagnosis not present

## 2018-12-03 DIAGNOSIS — C50919 Malignant neoplasm of unspecified site of unspecified female breast: Secondary | ICD-10-CM | POA: Diagnosis not present

## 2018-12-03 DIAGNOSIS — G8929 Other chronic pain: Secondary | ICD-10-CM | POA: Diagnosis not present

## 2018-12-03 DIAGNOSIS — R252 Cramp and spasm: Secondary | ICD-10-CM | POA: Diagnosis not present

## 2018-12-03 DIAGNOSIS — E039 Hypothyroidism, unspecified: Secondary | ICD-10-CM | POA: Diagnosis not present

## 2018-12-03 DIAGNOSIS — M1990 Unspecified osteoarthritis, unspecified site: Secondary | ICD-10-CM | POA: Diagnosis not present

## 2018-12-03 DIAGNOSIS — E785 Hyperlipidemia, unspecified: Secondary | ICD-10-CM | POA: Diagnosis not present

## 2018-12-03 DIAGNOSIS — R69 Illness, unspecified: Secondary | ICD-10-CM | POA: Diagnosis not present

## 2018-12-05 DIAGNOSIS — M9902 Segmental and somatic dysfunction of thoracic region: Secondary | ICD-10-CM | POA: Diagnosis not present

## 2018-12-05 DIAGNOSIS — M545 Low back pain: Secondary | ICD-10-CM | POA: Diagnosis not present

## 2018-12-05 DIAGNOSIS — M9905 Segmental and somatic dysfunction of pelvic region: Secondary | ICD-10-CM | POA: Diagnosis not present

## 2018-12-05 DIAGNOSIS — M9903 Segmental and somatic dysfunction of lumbar region: Secondary | ICD-10-CM | POA: Diagnosis not present

## 2018-12-05 DIAGNOSIS — M546 Pain in thoracic spine: Secondary | ICD-10-CM | POA: Diagnosis not present

## 2018-12-08 DIAGNOSIS — M25561 Pain in right knee: Secondary | ICD-10-CM | POA: Diagnosis not present

## 2018-12-08 DIAGNOSIS — M545 Low back pain: Secondary | ICD-10-CM | POA: Diagnosis not present

## 2018-12-15 DIAGNOSIS — M545 Low back pain: Secondary | ICD-10-CM | POA: Diagnosis not present

## 2019-01-13 ENCOUNTER — Other Ambulatory Visit: Payer: Self-pay

## 2019-01-13 ENCOUNTER — Ambulatory Visit (HOSPITAL_COMMUNITY): Payer: Medicare HMO | Attending: Orthopedic Surgery

## 2019-01-13 ENCOUNTER — Encounter (HOSPITAL_COMMUNITY): Payer: Self-pay

## 2019-01-13 DIAGNOSIS — R29898 Other symptoms and signs involving the musculoskeletal system: Secondary | ICD-10-CM | POA: Insufficient documentation

## 2019-01-13 DIAGNOSIS — M545 Low back pain, unspecified: Secondary | ICD-10-CM

## 2019-01-13 DIAGNOSIS — R2689 Other abnormalities of gait and mobility: Secondary | ICD-10-CM | POA: Insufficient documentation

## 2019-01-13 NOTE — Therapy (Signed)
[place], [place], [address] Phone: [phone]   Fax:  [phone]  Physical Therapy Evaluation  Patient Details  Name: Breanna Hill MRN: [id number] Date of Birth: 1940-06-21 Referring Provider (PT): Latanya Maudlin, MD   Encounter Date: 01/13/2019  PT End of Session - 01/13/19 1212    Visit Number  1    Number of Visits  8    Date for PT Re-Evaluation  02/10/19    Authorization Type  Primary: A[employer]na Medicare, Secondary: BCBS    Authorization Time Period  01/13/19 to 02/10/19    PT Start Time  1130    PT Stop Time  1205    PT Time Calculation (min)  35 min    Activity Tolerance  Patient tolerated treatment well;No increased pain    Behavior During Therapy  WFL for tasks assessed/performed       Past Medical History:  Diagnosis Date  . Arthritis    R hand   . Breast disorder    cancer  . Cancer [facility])    breast CA- diagnosed with needle biopsy  . Depression   . GERD (gastroesophageal reflux disease)    rare use of tums   . High cholesterol 10/17/2015  . History of radiation therapy 03/27/17- 04/23/17   Left Breast, 2.67 Gy in 15 fractions for a total dose of 40.05 Gy. Boost, 2 Gy in 5 fractions for a total dose of 10 Gy.   Marland Kitchen Hypothyroidism   . Pre-diabetes    Hgba1C- in the 6 's , per pt., she monitors her diet    Past Surgical History:  Procedure Laterality Date  . [place]  . BREAST LUMPECTOMY Left 02/2017  . BREAST LUMPECTOMY WITH RADIOACTIVE SEED AND SENTINEL LYMPH NODE BIOPSY Left 02/27/2017   Procedure: LEFT BREAST LUMPECTOMY WITH RADIOACTIVE SEED AND LEFT AXILLARY SENTINEL LYMPH NODE BIOPSY;  Surgeon: Donnie Mesa, MD;  Location: [place];  Service: General;  Laterality: Left;  . COLONOSCOPY     X 2  . COLONOSCOPY N/A 02/22/2016   Procedure: COLONOSCOPY;  Surgeon: Rogene Houston, MD;  [facility];  Service: Endoscopy;  Laterality: N/A;  1:45  . EYE SURGERY Bilateral     phlebpheroplasty  . Left shoultder surgery     for bone spurs & frozen shoulder   . Right bunionectomy    . TONSILLECTOMY    . TUBAL LIGATION      There were no vitals filed for this visit.   Subjective Assessment - 01/13/19 1134    Subjective  Pt reports spouse falls often and she picks him up; in December 2020 the pt and her granddaughter lifted pt's spouse and pt's sciatica nerve pain shot down the back of R leg. Pt reports she has stopped lifting objects and her spouse, now has aide that assists spouse with dressing and feeding. Pt reports she has to assist spouse into car and load/unload WC (~60 lbs). Pt reports fatigue with household chores and a little pain "nothing bad" with loading/unloading WC into car. Pt reports feeling sore/tired after activity. Pt reports most painful when first laying down and pain wraps around to R obliques. Pt reports success with ibuprofen prior to bed to alleviate pain. Pt reports transitions between sit<>stand cause the most pain. Pt reports increased pain with prolonged sitting when driving. Pt denies weakness throughout BLE or loss of b/b. Pt denies numbness/tingling down BLE, reports she has had low  back pain with radiating symptoms down legs before and this is different. Pt reports seeing doctor in December when pain first began, was given cortisone and prednisone along with a shot in her knee, but the back pain never resolved; pt denies MRI and hopes to get one if PT is unsuccessful.    Pertinent History  L3 compression fracture    How long can you sit comfortably?  no issues    How long can you stand comfortably?  no issues    How long can you walk comfortably?  no issues    Patient Stated Goals  that I don't hurt    Currently in Pain?  Yes    Pain Score  1     Pain Location  Back    Pain Orientation  Lower    Pain Descriptors / Indicators  Nagging    Pain Type  Acute pain    Pain Onset  1 to 4 weeks ago    Pain Frequency  Constant    Aggravating  Factors   sitting to drive, lifting heavy things, transitions, first laying down    Pain Relieving Factors  ibuprofen, tylenol    Effect of Pain on Daily Activities  limited         [facility] PT Assessment - 01/13/19 0001      Assessment   Medical Diagnosis  L3 compression fx    Referring Provider (PT)  Latanya Maudlin, MD    Onset Date/Surgical Date  --   December 2020   Next MD Visit  none scheduled    Prior Therapy  Yes, for different issues      Precautions   Precautions  None      Restrictions   Weight Bearing Restrictions  No      Balance Screen   Has the patient fallen in the past 6 months  No    Has the patient had a decrease in activity level because of a fear of falling?   No    Is the patient reluctant to leave their home because of a fear of falling?   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    Leisure  household chores, reading      Cognition   Overall Cognitive Status  Within Functional Limits for tasks assessed      Observation/Other Assessments   Focus on Therapeutic Outcomes (FOTO)   47% limited      Sensation   Light Touch  Appears Intact    Additional Comments  deniess numbness, tingling, or shooting pains down BLE      Functional Tests   Functional tests  Sit to Stand      Sit to Stand   Comments  5x STS: 8.4 sec, no UE assist, no increased pain      Posture/Postural Control   Posture Comments  slightly forward flexed trunk position in static standing      ROM / Strength   AROM / PROM / Strength  AROM;Strength      AROM   AROM Assessment Site  Lumbar    Lumbar Flexion  WFL    Lumbar Extension  50% limited, painful    Lumbar - Right Side Bend  WFL    Lumbar - Left Side Bend  50% limited, painful    Lumbar - Right Rotation  [facility]  Lumbar - Left Rotation  WFL, painful      Strength   Overall Strength Comments  denies pain with all  strength testing    Strength Assessment Site  Hip;Knee;Ankle    Right Hip Flexion  4+/5    Right Hip Extension  4+/5    Right Hip ABduction  4+/5    Left Hip Flexion  4+/5    Left Hip Extension  4+/5    Left Hip ABduction  4+/5    Right Knee Flexion  4+/5    Right Knee Extension  5/5    Left Knee Flexion  4+/5    Left Knee Extension  5/5    Right Ankle Dorsiflexion  5/5    Left Ankle Dorsiflexion  5/5      Palpation   Spinal mobility  hypomobility, denies pain with gentle PAs    Palpation comment  TTP along R lumbar paraspinals, lats, obliques, and less intense along R piriformis; denies TTP throughout entire L low back/hip      Ambulation/Gait   Gait Comments  observed pt ambulate into/out of clinic without AD, no unsteadiness noted, gait [facility]      Balance   Balance Assessed  Yes      Static Standing Balance   Static Standing - Balance Support  No upper extremity supported    Static Standing Balance -  Activities   Single Leg Stance - Right Leg;Single Leg Stance - Left Leg    Static Standing - Comment/# of Minutes  R: 5.5 sec, L: 30+sec with mild unsteadiness           Objective measurements completed on examination: See above findings.     PT Education - 01/13/19 1227    Education Details  Assessment findings, POC, FOTO findings, avoiding repetitive bending, lifting, twisting with healing fracture and to alleviate discomfort.    Person(s) Educated  Patient    Methods  Explanation    Comprehension  Verbalized understanding       PT Short Term Goals - 01/13/19 1217      PT SHORT TERM GOAL #1   Title  Pt will be independent with HEP, perform at least 2x/week to reduce pain with functional activity.    Time  2    Period  Weeks    Status  New    Target Date  01/27/19        PT Long Term Goals - 01/13/19 1218      PT LONG TERM GOAL #1   Title  Pt will demonstrate lumbar AROM to [facility] and pain-free in all directions to improve ability to perform household  chores.    Time  4    Period  Weeks    Status  New    Target Date  02/10/19      PT LONG TERM GOAL #2   Title  Pt will demonstrate safe lifting mechanics without pain to improve pt's ability to load WC into/out of vehicle without risk for injury.    Time  4    Period  Weeks    Status  New      PT LONG TERM GOAL #3   Title  Pt will score 25% limitation on FOTO to indicate improvement in overall ability to complete household chores and improvement in QoL.    Time  4    Period  Weeks    Status  New      PT LONG TERM GOAL #4   Title  Pt will reports 5/10 pain at worst in low back with activity or sleeping to improve QoL.    Time  4    Period  Weeks    Status  New             Plan - 01/13/19 1214    Clinical Impression Statement  Pt is a pleasant 79YO female with recent low back pain flare up from lifting spouse from floor. Pt with x-ray positive for L3 compression fracture and no MRI. Pt is tender along R paraspinals and continues laterally throughout lats and obliques, but denies TTP along L side of spine and denies TTP with gentle PAs reporting positive results of improvement in pain. Pt is motivated to return to PLOF as quickly as possible. Pt demonstrates deficits in AROM, strength, balance, muscle restrictions and increased pain with functional mobility. Pt would benefit from skilled PT interventions to address deficits noted, establish HEP and improve overall QoL.    Personal Factors and Comorbidities  Age    Examination-Activity Limitations  Carry;Lift;Sleep    Examination-Participation Restrictions  Driving;Interpersonal Relationship    Stability/Clinical Decision Making  Stable/Uncomplicated    Clinical Decision Making  Low    Rehab Potential  Good    PT Frequency  2x / week    PT Duration  4 weeks    PT Treatment/Interventions  ADLs/Self Care Home Management;Aquatic Therapy;Biofeedback;DME Instruction;Gait training;Stair training;Functional mobility  training;Therapeutic activities;Therapeutic exercise;Balance training;Neuromuscular re-education;Patient/family education;Orthotic Fit/Training;Manual techniques;Passive range of motion;Dry needling;Taping;Joint Manipulations    PT Next Visit Plan  Review goals and initiate HEP. Begin isometric core strengthening in hooklying. Add stretching for BLE. Avoid repeated flexion/extension and rotation exercises due to healing L3 compression fracture.    PT Home Exercise Plan  Initiate at first treatment session.    Consulted and Agree with Plan of Care  Patient       Patient will benefit from skilled therapeutic intervention in order to improve the following deficits and impairments:  Decreased activity tolerance, Decreased balance, Decreased range of motion, Decreased strength, Increased fascial restricitons, Increased muscle spasms, Impaired perceived functional ability, Impaired flexibility, Improper body mechanics, Pain  Visit Diagnosis: Acute low back pain without sciatica, unspecified back pain laterality  Other abnormalities of gait and mobility  Other symptoms and signs involving the musculoskeletal system     Problem List Patient Active Problem List   Diagnosis Date Noted  . Malignant neoplasm of upper-inner quadrant of left breast in female, estrogen receptor positive ([place]) 03/05/2017  . History of colonic polyps 10/31/2015  . High cholesterol 10/17/2015  . SHOULDER, ARTHRITIS, DEGEN./OSTEO 02/28/2009  . CONTRACTURE OF SHOULDER JOINT 02/16/2009  . SHOULDER PAIN 02/16/2009      Talbot Grumbling PT, DPT 01/13/19, 12:31 PM [place], [place], [address] Phone: [phone]   Fax:  [phone]  Name: CAITLAN COFRANCESCO MRN: [id number] Date of Birth: 1940-10-21

## 2019-01-15 ENCOUNTER — Other Ambulatory Visit: Payer: Self-pay

## 2019-01-15 ENCOUNTER — Ambulatory Visit (HOSPITAL_COMMUNITY): Payer: Medicare HMO | Admitting: Physical Therapy

## 2019-01-15 ENCOUNTER — Encounter (HOSPITAL_COMMUNITY): Payer: Self-pay | Admitting: Physical Therapy

## 2019-01-15 DIAGNOSIS — M545 Low back pain, unspecified: Secondary | ICD-10-CM

## 2019-01-15 DIAGNOSIS — R29898 Other symptoms and signs involving the musculoskeletal system: Secondary | ICD-10-CM

## 2019-01-15 DIAGNOSIS — R2689 Other abnormalities of gait and mobility: Secondary | ICD-10-CM | POA: Diagnosis not present

## 2019-01-15 NOTE — Therapy (Signed)
[place], [place], [address] Phone: [phone]   Fax:  [phone]  Physical Therapy Treatment  Patient Details  Name: Breanna Hill MRN: [id number] Date of Birth: 09-26-1940 Referring Provider (PT): Latanya Maudlin, MD   Encounter Date: 01/15/2019  PT End of Session - 01/15/19 1401    Visit Number  2    Number of Visits  8    Date for PT Re-Evaluation  02/10/19    Authorization Type  Primary: Aetna Medicare, Secondary: BCBS    Authorization Time Period  01/13/19 to 02/10/19    PT Start Time  [id number]   Patient arrived late   PT Stop Time  1425    PT Time Calculation (min)  30 min    Activity Tolerance  Patient tolerated treatment well;No increased pain    Behavior During Therapy  WFL for tasks assessed/performed       Past Medical History:  Diagnosis Date  . Arthritis    R hand   . Breast disorder    cancer  . Cancer [facility])    breast CA- diagnosed with needle biopsy  . Depression   . GERD (gastroesophageal reflux disease)    rare use of tums   . High cholesterol 10/17/2015  . History of radiation therapy 03/27/17- 04/23/17   Left Breast, 2.67 Gy in 15 fractions for a total dose of 40.05 Gy. Boost, 2 Gy in 5 fractions for a total dose of 10 Gy.   Marland Kitchen Hypothyroidism   . Pre-diabetes    Hgba1C- in the 6 's , per pt., she monitors her diet    Past Surgical History:  Procedure Laterality Date  . [place]  . BREAST LUMPECTOMY Left 02/2017  . BREAST LUMPECTOMY WITH RADIOACTIVE SEED AND SENTINEL LYMPH NODE BIOPSY Left 02/27/2017   Procedure: LEFT BREAST LUMPECTOMY WITH RADIOACTIVE SEED AND LEFT AXILLARY SENTINEL LYMPH NODE BIOPSY;  Surgeon: Donnie Mesa, MD;  Location: [place];  Service: General;  Laterality: Left;  . COLONOSCOPY     X 2  . COLONOSCOPY N/A 02/22/2016   Procedure: COLONOSCOPY;  Surgeon: Rogene Houston, MD;  [facility];  Service: Endoscopy;  Laterality: N/A;  1:45  . EYE  SURGERY Bilateral    phlebpheroplasty  . Left shoultder surgery     for bone spurs & frozen shoulder   . Right bunionectomy    . TONSILLECTOMY    . TUBAL LIGATION      There were no vitals filed for this visit.  Subjective Assessment - 01/15/19 1407    Subjective  Patient says her back hurts but she is doing fine. Patient says she has  "to do what I have to do". Patient says she was somewhat sore after evaluation but is about a 3/10 today.    Pertinent History  L3 compression fracture    How long can you sit comfortably?  no issues    How long can you stand comfortably?  no issues    How long can you walk comfortably?  no issues    Patient Stated Goals  that I don't hurt    Currently in Pain?  Yes    Pain Score  3     Pain Location  Back    Pain Orientation  Right;Posterior;Lateral    Pain Descriptors / Indicators  Aching;Sharp    Pain Type  Acute pain    Pain Radiating Towards  RT lateral thigh  Pain Onset  1 to 4 weeks ago    Pain Frequency  Constant                       [facility] Adult PT Treatment/Exercise - 01/15/19 0001      Exercises   Exercises  Lumbar      Lumbar Exercises: Supine   Ab Set  10 reps;5 seconds    Glut Set  10 reps;5 seconds    Other Supine Lumbar Exercises  Bent knee fallout x10; supine hip adduction squeeze 10 x5"             PT Education - 01/15/19 1417    Education Details  Patient educated on HEP, exercise and technique    Person(s) Educated  Patient    Methods  Explanation;Handout    Comprehension  Verbalized understanding       PT Short Term Goals - 01/13/19 1217      PT SHORT TERM GOAL #1   Title  Pt will be independent with HEP, perform at least 2x/week to reduce pain with functional activity.    Time  2    Period  Weeks    Status  New    Target Date  01/27/19        PT Long Term Goals - 01/13/19 1218      PT LONG TERM GOAL #1   Title  Pt will demonstrate lumbar AROM to [facility] and pain-free in all  directions to improve ability to perform household chores.    Time  4    Period  Weeks    Status  New    Target Date  02/10/19      PT LONG TERM GOAL #2   Title  Pt will demonstrate safe lifting mechanics without pain to improve pt's ability to load WC into/out of vehicle without risk for injury.    Time  4    Period  Weeks    Status  New      PT LONG TERM GOAL #3   Title  Pt will score 25% limitation on FOTO to indicate improvement in overall ability to complete household chores and improvement in QoL.    Time  4    Period  Weeks    Status  New      PT LONG TERM GOAL #4   Title  Pt will reports 5/10 pain at worst in low back with activity or sleeping to improve QoL.    Time  4    Period  Weeks    Status  New            Plan - 01/15/19 1422    Clinical Impression Statement  Initiated treatment this visit. Reviewed goals and plan of care. Patient educated on proper form and function of all exercise performed today. Patient issued HEP handout and educated on frequency.    Personal Factors and Comorbidities  Age    Examination-Activity Limitations  Carry;Lift;Sleep    Examination-Participation Restrictions  Driving;Interpersonal Relationship    Stability/Clinical Decision Making  Stable/Uncomplicated    Rehab Potential  Good    PT Frequency  2x / week    PT Duration  4 weeks    PT Treatment/Interventions  ADLs/Self Care Home Management;Aquatic Therapy;Biofeedback;DME Instruction;Gait training;Stair training;Functional mobility training;Therapeutic activities;Therapeutic exercise;Balance training;Neuromuscular re-education;Patient/family education;Orthotic Fit/Training;Manual techniques;Passive range of motion;Dry needling;Taping;Joint Manipulations    PT Next Visit Plan  Continue to progress hip and core strengthening as tolerated. Add decompression  lumbar exercise next visit.  Avoid repeated flexion/extension and rotation exercises due to healing L3 compression fracture.    PT  Home Exercise Plan  Initiate at first treatment session.    Consulted and Agree with Plan of Care  Patient       Patient will benefit from skilled therapeutic intervention in order to improve the following deficits and impairments:  Decreased activity tolerance, Decreased balance, Decreased range of motion, Decreased strength, Increased fascial restricitons, Increased muscle spasms, Impaired perceived functional ability, Impaired flexibility, Improper body mechanics, Pain  Visit Diagnosis: Acute low back pain without sciatica, unspecified back pain laterality  Other abnormalities of gait and mobility  Other symptoms and signs involving the musculoskeletal system     Problem List Patient Active Problem List   Diagnosis Date Noted  . Malignant neoplasm of upper-inner quadrant of left breast in female, estrogen receptor positive ([place]) 03/05/2017  . History of colonic polyps 10/31/2015  . High cholesterol 10/17/2015  . SHOULDER, ARTHRITIS, DEGEN./OSTEO 02/28/2009  . CONTRACTURE OF SHOULDER JOINT 02/16/2009  . SHOULDER PAIN 02/16/2009   2:27 PM, 01/15/19 Josue Hector PT DPT  Physical Therapist with [facility]  [phone] [place], [place], [address] Phone: [phone]   Fax:  [phone]  Name: Breanna Hill MRN: [id number] Date of Birth: 05-18-1940

## 2019-01-15 NOTE — Patient Instructions (Signed)
Access Code: [facility]  URL: [email or web address]  Date: 01/15/2019  Prepared by: Josue Hector   Exercises Supine Transversus Abdominis Bracing - Hands on Stomach - 10 reps - 1 sets - 5 hold - 2x daily - 7x weekly Supine Gluteal Sets - 10 reps - 1 sets - 5 hold - 2x daily - 7x weekly Supine Hip Adduction Isometric with Ball - 10 reps - 1 sets - 5 hold - 2x daily - 7x weekly Bent Knee Fallouts - 10 reps - 1 sets - 2x daily - 7x weekly

## 2019-01-20 ENCOUNTER — Other Ambulatory Visit: Payer: Self-pay

## 2019-01-20 ENCOUNTER — Encounter (HOSPITAL_COMMUNITY): Payer: Self-pay | Admitting: Physical Therapy

## 2019-01-20 ENCOUNTER — Ambulatory Visit (HOSPITAL_COMMUNITY): Payer: Medicare HMO | Admitting: Physical Therapy

## 2019-01-20 DIAGNOSIS — R29898 Other symptoms and signs involving the musculoskeletal system: Secondary | ICD-10-CM | POA: Diagnosis not present

## 2019-01-20 DIAGNOSIS — M545 Low back pain, unspecified: Secondary | ICD-10-CM

## 2019-01-20 DIAGNOSIS — R2689 Other abnormalities of gait and mobility: Secondary | ICD-10-CM

## 2019-01-20 NOTE — Therapy (Signed)
[place], [place], [address] Phone: [phone]   Fax:  [phone]  Physical Therapy Treatment  Patient Details  Name: Breanna Hill MRN: [id number] Date of Birth: 1940/08/23 Referring Provider (PT): Latanya Maudlin, MD   Encounter Date: 01/20/2019  PT End of Session - 01/20/19 1315    Visit Number  3    Number of Visits  8    Date for PT Re-Evaluation  02/10/19    Authorization Type  Primary: Aetna Medicare, Secondary: BCBS    Authorization Time Period  01/13/19 to 02/10/19    PT Start Time  1310   patient arrived late   PT Stop Time  1345    PT Time Calculation (min)  35 min    Activity Tolerance  Patient tolerated treatment well;No increased pain    Behavior During Therapy  WFL for tasks assessed/performed       Past Medical History:  Diagnosis Date  . Arthritis    R hand   . Breast disorder    cancer  . Cancer [facility])    breast CA- diagnosed with needle biopsy  . Depression   . GERD (gastroesophageal reflux disease)    rare use of tums   . High cholesterol 10/17/2015  . History of radiation therapy 03/27/17- 04/23/17   Left Breast, 2.67 Gy in 15 fractions for a total dose of 40.05 Gy. Boost, 2 Gy in 5 fractions for a total dose of 10 Gy.   Marland Kitchen Hypothyroidism   . Pre-diabetes    Hgba1C- in the 6 's , per pt., she monitors her diet    Past Surgical History:  Procedure Laterality Date  . [place]  . BREAST LUMPECTOMY Left 02/2017  . BREAST LUMPECTOMY WITH RADIOACTIVE SEED AND SENTINEL LYMPH NODE BIOPSY Left 02/27/2017   Procedure: LEFT BREAST LUMPECTOMY WITH RADIOACTIVE SEED AND LEFT AXILLARY SENTINEL LYMPH NODE BIOPSY;  Surgeon: Donnie Mesa, MD;  Location: [place];  Service: General;  Laterality: Left;  . COLONOSCOPY     X 2  . COLONOSCOPY N/A 02/22/2016   Procedure: COLONOSCOPY;  Surgeon: Rogene Houston, MD;  [facility];  Service: Endoscopy;  Laterality: N/A;  1:45  . EYE  SURGERY Bilateral    phlebpheroplasty  . Left shoultder surgery     for bone spurs & frozen shoulder   . Right bunionectomy    . TONSILLECTOMY    . TUBAL LIGATION      There were no vitals filed for this visit.  Subjective Assessment - 01/20/19 1314    Subjective  Patient says she is feeling better today than yesterday. Patient says she was sore in buttock area and back over the last 2 days but does not know why.    Pertinent History  L3 compression fracture    How long can you sit comfortably?  no issues    How long can you stand comfortably?  no issues    How long can you walk comfortably?  no issues    Patient Stated Goals  that I don't hurt    Currently in Pain?  Yes    Pain Score  3     Pain Location  Back    Pain Orientation  Right;Posterior;Lateral    Pain Descriptors / Indicators  Aching    Pain Onset  1 to 4 weeks ago  [place] Adult PT Treatment/Exercise - 01/20/19 0001      Lumbar Exercises: Stretches   Piriformis Stretch  Right;Left;5 reps;10 seconds      Lumbar Exercises: Supine   Ab Set  10 reps;5 seconds    Glut Set  10 reps;5 seconds    Other Supine Lumbar Exercises  Bent knee fallout x10; supine hip adduction squeeze 10 x5"    Other Supine Lumbar Exercises  Lumbar decompression exercise 4, 10 x 5" each       Manual Therapy   Manual Therapy  Soft tissue mobilization    Manual therapy comments  All manuals performed separate from all other activity     Soft tissue mobilization  STM to bilateral lumbar paraspinals using tennis ball                PT Short Term Goals - 01/13/19 1217      PT SHORT TERM GOAL #1   Title  Pt will be independent with HEP, perform at least 2x/week to reduce pain with functional activity.    Time  2    Period  Weeks    Status  New    Target Date  01/27/19        PT Long Term Goals - 01/13/19 1218      PT LONG TERM GOAL #1   Title  Pt will demonstrate lumbar AROM to [facility] and  pain-free in all directions to improve ability to perform household chores.    Time  4    Period  Weeks    Status  New    Target Date  02/10/19      PT LONG TERM GOAL #2   Title  Pt will demonstrate safe lifting mechanics without pain to improve pt's ability to load WC into/out of vehicle without risk for injury.    Time  4    Period  Weeks    Status  New      PT LONG TERM GOAL #3   Title  Pt will score 25% limitation on FOTO to indicate improvement in overall ability to complete household chores and improvement in QoL.    Time  4    Period  Weeks    Status  New      PT LONG TERM GOAL #4   Title  Pt will reports 5/10 pain at worst in low back with activity or sleeping to improve QoL.    Time  4    Period  Weeks    Status  New            Plan - 01/20/19 1348    Clinical Impression Statement  Patient requires cueing for appropriate hold times during isometric exercise and pacing of activity for proper target muscle activation. Added piriformis stretching and lumbar decompression exercise for core strength and lumbar mobility, patient educated on proper form and function. Patient cued to avoid excessive ankle DF on RT during decompression exercise to avoid cramping sensation in calf. Added manual treatment to address pain and restrictions in lumbar area.    Personal Factors and Comorbidities  Age    Examination-Activity Limitations  Carry;Lift;Sleep    Examination-Participation Restrictions  Driving;Interpersonal Relationship    Stability/Clinical Decision Making  Stable/Uncomplicated    Rehab Potential  Good    PT Frequency  2x / week    PT Duration  4 weeks    PT Treatment/Interventions  ADLs/Self Care Home Management;Aquatic Therapy;Biofeedback;DME Instruction;Gait training;Stair training;Functional mobility training;Therapeutic activities;Therapeutic exercise;Balance training;Neuromuscular  re-education;Patient/family education;Orthotic Fit/Training;Manual techniques;Passive  range of motion;Dry needling;Taping;Joint Manipulations    PT Next Visit Plan  Continue to progress hip and core strengthening as tolerated. Avoid repeated flexion/extension and rotation exercises due to healing L3 compression fracture.    PT Home Exercise Plan  glute set, ab set, bent knee fall out, pillow squeeze    Consulted and Agree with Plan of Care  Patient       Patient will benefit from skilled therapeutic intervention in order to improve the following deficits and impairments:  Decreased activity tolerance, Decreased balance, Decreased range of motion, Decreased strength, Increased fascial restricitons, Increased muscle spasms, Impaired perceived functional ability, Impaired flexibility, Improper body mechanics, Pain  Visit Diagnosis: Acute low back pain without sciatica, unspecified back pain laterality  Other abnormalities of gait and mobility  Other symptoms and signs involving the musculoskeletal system     Problem List Patient Active Problem List   Diagnosis Date Noted  . Malignant neoplasm of upper-inner quadrant of left breast in female, estrogen receptor positive ([place]) 03/05/2017  . History of colonic polyps 10/31/2015  . High cholesterol 10/17/2015  . SHOULDER, ARTHRITIS, DEGEN./OSTEO 02/28/2009  . CONTRACTURE OF SHOULDER JOINT 02/16/2009  . SHOULDER PAIN 02/16/2009   1:49 PM, 01/20/19 Josue Hector PT DPT  Physical Therapist with [facility]  [phone] [place] [address] [place], [place], [address] Phone: [phone]   Fax:  [phone]  Name: BIBIANNA SKONIECZNY MRN: [id number] Date of Birth: June 06, 1940

## 2019-01-22 ENCOUNTER — Ambulatory Visit (HOSPITAL_COMMUNITY): Payer: Medicare HMO | Admitting: Physical Therapy

## 2019-01-22 ENCOUNTER — Encounter (HOSPITAL_COMMUNITY): Payer: Self-pay | Admitting: Physical Therapy

## 2019-01-22 ENCOUNTER — Other Ambulatory Visit: Payer: Self-pay

## 2019-01-22 DIAGNOSIS — M545 Low back pain, unspecified: Secondary | ICD-10-CM

## 2019-01-22 DIAGNOSIS — R2689 Other abnormalities of gait and mobility: Secondary | ICD-10-CM | POA: Diagnosis not present

## 2019-01-22 DIAGNOSIS — R29898 Other symptoms and signs involving the musculoskeletal system: Secondary | ICD-10-CM | POA: Diagnosis not present

## 2019-01-22 NOTE — Therapy (Signed)
[place], [place], [address] Phone: [phone]   Fax:  [phone]  Physical Therapy Treatment  Patient Details  Name: Breanna Hill MRN: [id number] Date of Birth: Mar 15, 1940 Referring Provider (PT): Latanya Maudlin, MD   Encounter Date: 01/22/2019  PT End of Session - 01/22/19 1325    Visit Number  4    Number of Visits  8    Date for PT Re-Evaluation  02/10/19    Authorization Type  Primary: Aetna Medicare, Secondary: BCBS    Authorization Time Period  01/13/19 to 02/10/19    Authorization - Visit Number  4    Authorization - Number of Visits  10    PT Start Time  [id number]   arrived on time but checkin process and bathroom delayed start   PT Stop Time  1355    PT Time Calculation (min)  29 min    Activity Tolerance  Patient tolerated treatment well;No increased pain    Behavior During Therapy  WFL for tasks assessed/performed       Past Medical History:  Diagnosis Date  . Arthritis    R hand   . Breast disorder    cancer  . Cancer [facility])    breast CA- diagnosed with needle biopsy  . Depression   . GERD (gastroesophageal reflux disease)    rare use of tums   . High cholesterol 10/17/2015  . History of radiation therapy 03/27/17- 04/23/17   Left Breast, 2.67 Gy in 15 fractions for a total dose of 40.05 Gy. Boost, 2 Gy in 5 fractions for a total dose of 10 Gy.   Marland Kitchen Hypothyroidism   . Pre-diabetes    Hgba1C- in the 6 's , per pt., she monitors her diet    Past Surgical History:  Procedure Laterality Date  . [place]  . BREAST LUMPECTOMY Left 02/2017  . BREAST LUMPECTOMY WITH RADIOACTIVE SEED AND SENTINEL LYMPH NODE BIOPSY Left 02/27/2017   Procedure: LEFT BREAST LUMPECTOMY WITH RADIOACTIVE SEED AND LEFT AXILLARY SENTINEL LYMPH NODE BIOPSY;  Surgeon: Donnie Mesa, MD;  Location: [facility] [place];  Service: General;  Laterality: Left;  . COLONOSCOPY     X 2  . COLONOSCOPY N/A 02/22/2016   Procedure:  COLONOSCOPY;  Surgeon: Rogene Houston, MD;  [facility];  Service: Endoscopy;  Laterality: N/A;  1:45  . EYE SURGERY Bilateral    phlebpheroplasty  . Left shoultder surgery     for bone spurs & frozen shoulder   . Right bunionectomy    . TONSILLECTOMY    . TUBAL LIGATION      There were no vitals filed for this visit.  Subjective Assessment - 01/22/19 1326    Subjective  States she is feeling a little better today. States she felt alright after after session. States some of the exercises help decrease some of her pain.    Pertinent History  L3 compression fracture    How long can you sit comfortably?  no issues    How long can you stand comfortably?  no issues    How long can you walk comfortably?  no issues    Patient Stated Goals  that I don't hurt    Currently in Pain?  Yes    Pain Score  1     Pain Location  Back    Pain Radiating Towards  right low back and on the right knee    Pain  Onset  1 to 4 weeks ago         [facility] PT Assessment - 01/22/19 0001      Assessment   Medical Diagnosis  L3 compression fx    Referring Provider (PT)  Latanya Maudlin, MD                   [facility] Adult PT Treatment/Exercise - 01/22/19 0001      Lumbar Exercises: Stretches   Single Knee to Chest Stretch  5 reps;10 seconds   x3 sets   Lower Trunk Rotation  60 seconds   x4 reps PT assisted     Lumbar Exercises: Supine   Bridge  10 reps;3 seconds   3 sets   Other Supine Lumbar Exercises  pelvic tilts - feet elevzated on table - 3x30" holds in both directions             PT Education - 01/22/19 1611    Education Details  educated patietn on tendenday to lean to right side with standing and sitting. practiced standing up tall exercise with right side up against the wall    Person(s) Educated  Patient    Methods  Explanation    Comprehension  Verbalized understanding       PT Short Term Goals - 01/13/19 1217      PT SHORT TERM GOAL #1   Title  Pt  will be independent with HEP, perform at least 2x/week to reduce pain with functional activity.    Time  2    Period  Weeks    Status  New    Target Date  01/27/19        PT Long Term Goals - 01/13/19 1218      PT LONG TERM GOAL #1   Title  Pt will demonstrate lumbar AROM to [facility] and pain-free in all directions to improve ability to perform household chores.    Time  4    Period  Weeks    Status  New    Target Date  02/10/19      PT LONG TERM GOAL #2   Title  Pt will demonstrate safe lifting mechanics without pain to improve pt's ability to load WC into/out of vehicle without risk for injury.    Time  4    Period  Weeks    Status  New      PT LONG TERM GOAL #3   Title  Pt will score 25% limitation on FOTO to indicate improvement in overall ability to complete household chores and improvement in QoL.    Time  4    Period  Weeks    Status  New      PT LONG TERM GOAL #4   Title  Pt will reports 5/10 pain at worst in low back with activity or sleeping to improve QoL.    Time  4    Period  Weeks    Status  New            Plan - 01/22/19 1612    Clinical Impression Statement  Session limited secondary to patient starting session late. Focused on unweighted lumbar and LE mobility and strengthening and all of these exercises were tolerated well. Noted that patient sits and stands in right side bending, when attempted to correct this, pain reproduced on right side. Patient would benefit from skilled physical therapy focusing on improving functional mobility, will follow up with stretching right trunk musculature as tolerated to  improve overall mobility and decrease stresses on spine.    Personal Factors and Comorbidities  Age    Examination-Activity Limitations  Carry;Lift;Sleep    Examination-Participation Restrictions  Driving;Interpersonal Relationship    Stability/Clinical Decision Making  Stable/Uncomplicated    Rehab Potential  Good    PT Frequency  2x / week    PT  Duration  4 weeks    PT Treatment/Interventions  ADLs/Self Care Home Management;Aquatic Therapy;Biofeedback;DME Instruction;Gait training;Stair training;Functional mobility training;Therapeutic activities;Therapeutic exercise;Balance training;Neuromuscular re-education;Patient/family education;Orthotic Fit/Training;Manual techniques;Passive range of motion;Dry needling;Taping;Joint Manipulations    PT Next Visit Plan  L SB stretch (gentle)- stays in right side bending. Continue to progress hip and core strengthening as tolerated. Avoid repeated flexion/extension and rotation exercises with compressive forces due to healing L3 compression fracture.    PT Home Exercise Plan  glute set, ab set, bent knee fall out, pillow squeeze    Consulted and Agree with Plan of Care  Patient       Patient will benefit from skilled therapeutic intervention in order to improve the following deficits and impairments:  Decreased activity tolerance, Decreased balance, Decreased range of motion, Decreased strength, Increased fascial restricitons, Increased muscle spasms, Impaired perceived functional ability, Impaired flexibility, Improper body mechanics, Pain  Visit Diagnosis: Acute low back pain without sciatica, unspecified back pain laterality  Other abnormalities of gait and mobility  Other symptoms and signs involving the musculoskeletal system     Problem List Patient Active Problem List   Diagnosis Date Noted  . Malignant neoplasm of upper-inner quadrant of left breast in female, estrogen receptor positive ([place]) 03/05/2017  . History of colonic polyps 10/31/2015  . High cholesterol 10/17/2015  . SHOULDER, ARTHRITIS, DEGEN./OSTEO 02/28/2009  . CONTRACTURE OF SHOULDER JOINT 02/16/2009  . SHOULDER PAIN 02/16/2009    4:16 PM, 01/22/19 Jerene Pitch, DPT Physical Therapy with [facility]  [phone] office  [place], [place], [address] Phone: [phone]   Fax:  [phone]  Name: Breanna Hill MRN: [id number] Date of Birth: Aug 14, 1940

## 2019-01-23 ENCOUNTER — Ambulatory Visit: Payer: Medicare HMO | Attending: Internal Medicine

## 2019-01-23 ENCOUNTER — Telehealth (HOSPITAL_COMMUNITY): Payer: Self-pay | Admitting: Physical Therapy

## 2019-01-23 DIAGNOSIS — Z20822 Contact with and (suspected) exposure to covid-19: Secondary | ICD-10-CM | POA: Diagnosis not present

## 2019-01-23 NOTE — Telephone Encounter (Signed)
Breanna Hill and Breanna Hill got a covid test on Friday 1-22 and request to cx Monday's appointments for both of them. [name] will call before Thursday to let us know what their test results are.

## 2019-01-24 LAB — NOVEL CORONAVIRUS, NAA: SARS-CoV-2, NAA: NOT DETECTED

## 2019-01-26 ENCOUNTER — Ambulatory Visit (HOSPITAL_COMMUNITY): Payer: Medicare HMO | Admitting: Physical Therapy

## 2019-01-29 ENCOUNTER — Other Ambulatory Visit: Payer: Self-pay

## 2019-01-29 ENCOUNTER — Encounter (HOSPITAL_COMMUNITY): Payer: Self-pay | Admitting: Physical Therapy

## 2019-01-29 ENCOUNTER — Ambulatory Visit (HOSPITAL_COMMUNITY): Payer: Medicare HMO | Admitting: Physical Therapy

## 2019-01-29 DIAGNOSIS — M545 Low back pain, unspecified: Secondary | ICD-10-CM

## 2019-01-29 DIAGNOSIS — R29898 Other symptoms and signs involving the musculoskeletal system: Secondary | ICD-10-CM

## 2019-01-29 DIAGNOSIS — R2689 Other abnormalities of gait and mobility: Secondary | ICD-10-CM | POA: Diagnosis not present

## 2019-01-29 NOTE — Progress Notes (Signed)
Patient Care Team: Asencion Noble, MD as PCP - General (Internal Medicine) Nicholas Lose, MD as Consulting Physician (Hematology and Oncology) [name], MD as Attending Physician (Radiation Oncology) Donnie Mesa, MD as Consulting Physician (General Surgery) Gardenia Phlegm, NP as Nurse Practitioner (Hematology and Oncology)  DIAGNOSIS:    ICD-10-CM   1. Malignant neoplasm of upper-inner quadrant of left breast in female, estrogen receptor positive ([place])  C50.212    Z17.0     SUMMARY OF ONCOLOGIC HISTORY: Oncology History  Malignant neoplasm of upper-inner quadrant of left breast in female, estrogen receptor positive (Breanna Hill)  02/27/2017 Surgery   Left lumpectomy 02/27/2017: IDC grade 1, 1.1 cm, ALH, margins negative, 0/2 lymph nodes negative, ER 90%, PR 10%, HER-2 negative ratio 1.26, Ki-67 1%, T1CN0 stage I a    03/27/2017 - 04/30/2017 Radiation Therapy   Adjuvant radiation therapy   04/30/2017 -  Anti-estrogen oral therapy   Letrozole 2.5 mg daily x5 years     CHIEF COMPLIANT: Follow-up of left breast cancer on letrozole therapy  INTERVAL HISTORY: Breanna Hill is a 79 y.o. with above-mentioned history of left breast cancer treated with lumpectomy, radiation, and who is currently on anti-estrogen therapy with letrozole. Mammogram on 10/27/18 showed no evidence of malignancy bilaterally. She presents to the clinic today for annual follow-up. Dry mouth. otherwise doing well.  ALLERGIES:  is allergic to penicillins.  MEDICATIONS:  Current Outpatient Medications  Medication Sig Dispense Refill  . acetaminophen (TYLENOL) 325 MG tablet Take 650 mg by mouth every 6 (six) hours as needed.    Marland Kitchen atorvastatin (LIPITOR) 20 MG tablet Take 20 mg by mouth every evening.     . escitalopram (LEXAPRO) 10 MG tablet Take 5 mg by mouth daily before breakfast.     . letrozole (FEMARA) 2.5 MG tablet Take 1 tablet (2.5 mg total) by mouth daily. 90 tablet 3  . levothyroxine (SYNTHROID,  LEVOTHROID) 100 MCG tablet Take 100 mcg by mouth daily before breakfast.     . Trospium Chloride 60 MG CP24 Take by mouth.     No current facility-administered medications for this visit.    PHYSICAL EXAMINATION: ECOG PERFORMANCE STATUS: 1 - Symptomatic but completely ambulatory  Vitals:   01/30/19 1032  BP: (!) 141/74  Pulse: 81  Resp: 17  Temp: 98.5 F (36.9 C)  SpO2: 95%   Filed Weights   01/30/19 1032  Weight: 165 lb (74.8 kg)    BREAST: No palpable masses or nodules in either right or left breasts. No palpable axillary supraclavicular or infraclavicular adenopathy no breast tenderness or nipple discharge. (exam performed in the presence of a chaperone)  LABORATORY DATA:  I have reviewed the data as listed CMP Latest Ref Rng & Units 02/21/2017 03/01/2014 02/28/2009  Glucose 65 - 99 mg/dL 134(H) 152(H) 122(H)  BUN 6 - 20 mg/dL 18 23 18   Creatinine 0.44 - 1.00 mg/dL 0.87 0.80 0.83  Sodium 135 - 145 mmol/L 138 138 138  Potassium 3.5 - 5.1 mmol/L 4.0 3.7 3.6  Chloride 101 - 111 mmol/L 102 105 101  CO2 22 - 32 mmol/L 26 27 29   Calcium 8.9 - 10.3 mg/dL 9.1 9.1 9.0  Total Protein 6.0 - 8.3 g/dL - 6.9 -  Total Bilirubin 0.3 - 1.2 mg/dL - 0.3 -  Alkaline Phos 39 - 117 U/L - 80 -  AST 0 - 37 U/L - 25 -  ALT 0 - 35 U/L - 27 -    Lab Results  Component Value Date   WBC 7.8 02/21/2017   HGB 14.5 02/21/2017   HCT 43.4 02/21/2017   MCV 93.5 02/21/2017   PLT 231 02/21/2017   NEUTROABS 4.4 03/01/2014    ASSESSMENT & PLAN:  Malignant neoplasm of upper-inner quadrant of left breast in female, estrogen receptor positive ([place]) Left lumpectomy 02/27/2017: IDC grade 1, 1.1 cm, ALH, margins negative, 0/2 lymph nodes negative, ER 90%, PR 10%, HER-2 negative ratio 1.26, Ki-67 1%, T1CN0 stage I a  Adjuvant radiation therapy 03/27/2017-04/30/2017 Treatment plan: Adjuvant antiestrogen therapy with letrozole 2.5 mg daily x5 years started 04/30/2017 Recovered from COVID-19  infection  Letrozole toxicities: 1.  Occasional hot flashes 2.  Mild joint stiffness especially in the hands  Right knee arthritis: I encouraged her to take turmeric  Breast cancer surveillance: 1.  Mammogram 10/27/2018: Benign breast density category B 2.  Breast exam 01/30/2019: Benign 3.  Bone density will need to be performed.  Return to clinic in 1 year for follow-up  No orders of the defined types were placed in this encounter.  The patient has a good understanding of the overall plan. she agrees with it. she will call with any problems that may develop before the next visit here.  Total time spent: 20 mins including face to face time and time spent for planning, charting and coordination of care  Nicholas Lose, MD 01/30/2019  I, Cloyde Reams Dorshimer, am acting as scribe for Dr. Nicholas Lose.  I have reviewed the above documentation for accuracy and completeness, and I agree with the above.

## 2019-01-29 NOTE — Therapy (Signed)
[place], [place], [address] Phone: [phone]   Fax:  [phone]  Physical Therapy Treatment  Patient Details  Name: Breanna Hill MRN: [id number] Date of Birth: 11-May-1940 Referring Provider (PT): Latanya Maudlin, MD   Encounter Date: 01/29/2019  PT End of Session - 01/29/19 1325    Visit Number  5    Number of Visits  8    Date for PT Re-Evaluation  02/10/19    Authorization Type  Primary: Aetna Medicare, Secondary: BCBS    Authorization Time Period  01/13/19 to 02/10/19    Authorization - Visit Number  5    Authorization - Number of Visits  10    PT Start Time  1325    PT Stop Time  1410    PT Time Calculation (min)  45 min    Activity Tolerance  Patient tolerated treatment well;No increased pain    Behavior During Therapy  WFL for tasks assessed/performed       Past Medical History:  Diagnosis Date  . Arthritis    R hand   . Breast disorder    cancer  . Cancer [facility])    breast CA- diagnosed with needle biopsy  . Depression   . GERD (gastroesophageal reflux disease)    rare use of tums   . High cholesterol 10/17/2015  . History of radiation therapy 03/27/17- 04/23/17   Left Breast, 2.67 Gy in 15 fractions for a total dose of 40.05 Gy. Boost, 2 Gy in 5 fractions for a total dose of 10 Gy.   Marland Kitchen Hypothyroidism   . Pre-diabetes    Hgba1C- in the 6 's , per pt., she monitors her diet    Past Surgical History:  Procedure Laterality Date  . [place]  . BREAST LUMPECTOMY Left 02/2017  . BREAST LUMPECTOMY WITH RADIOACTIVE SEED AND SENTINEL LYMPH NODE BIOPSY Left 02/27/2017   Procedure: LEFT BREAST LUMPECTOMY WITH RADIOACTIVE SEED AND LEFT AXILLARY SENTINEL LYMPH NODE BIOPSY;  Surgeon: Donnie Mesa, MD;  [facility];  Service: General;  Laterality: Left;  . COLONOSCOPY     X 2  . COLONOSCOPY N/A 02/22/2016   Procedure: COLONOSCOPY;  Surgeon: Rogene Houston, MD;  [facility];   Service: Endoscopy;  Laterality: N/A;  1:45  . EYE SURGERY Bilateral    phlebpheroplasty  . Left shoultder surgery     for bone spurs & frozen shoulder   . Right bunionectomy    . TONSILLECTOMY    . TUBAL LIGATION      There were no vitals filed for this visit.  Subjective Assessment - 01/29/19 1328    Subjective  Patient says back is "pretty good I guess". Says the some of the exercises hurt and that her RT has  been somewhat sore but reports no back pain currently.    Pertinent History  L3 compression fracture    How long can you sit comfortably?  no issues    How long can you stand comfortably?  no issues    How long can you walk comfortably?  no issues    Patient Stated Goals  that I don't hurt    Currently in Pain?  No/denies    Pain Onset  1 to 4 weeks ago                       [facility] Adult PT Treatment/Exercise - 01/29/19 0001  Lumbar Exercises: Stretches   Lower Trunk Rotation  5 reps;10 seconds    Piriformis Stretch  Right;Left;5 reps;10 seconds      Lumbar Exercises: Standing   Other Standing Lumbar Exercises  tandem stance, 3 x20", solid floor    Other Standing Lumbar Exercises  standing hip abduction/ extension 10x ea      Lumbar Exercises: Seated   Sit to Stand  10 reps    Sit to Stand Limitations  no UE assist      Lumbar Exercises: Supine   Ab Set  10 reps;5 seconds    Glut Set  10 reps;5 seconds    Bridge  10 reps;3 seconds    Other Supine Lumbar Exercises  alternating abdominal marching x10    Other Supine Lumbar Exercises  Lumbar decompression exercise 4, 10 x 5" each       Manual Therapy   Manual Therapy  Soft tissue mobilization    Manual therapy comments  All manuals performed separate from all other activity     Soft tissue mobilization  STM to bilateral lumbar paraspinals using tennis ball                PT Short Term Goals - 01/13/19 1217      PT SHORT TERM GOAL #1   Title  Pt will be independent with HEP,  perform at least 2x/week to reduce pain with functional activity.    Time  2    Period  Weeks    Status  New    Target Date  01/27/19        PT Long Term Goals - 01/13/19 1218      PT LONG TERM GOAL #1   Title  Pt will demonstrate lumbar AROM to [facility] and pain-free in all directions to improve ability to perform household chores.    Time  4    Period  Weeks    Status  New    Target Date  02/10/19      PT LONG TERM GOAL #2   Title  Pt will demonstrate safe lifting mechanics without pain to improve pt's ability to load WC into/out of vehicle without risk for injury.    Time  4    Period  Weeks    Status  New      PT LONG TERM GOAL #3   Title  Pt will score 25% limitation on FOTO to indicate improvement in overall ability to complete household chores and improvement in QoL.    Time  4    Period  Weeks    Status  New      PT LONG TERM GOAL #4   Title  Pt will reports 5/10 pain at worst in low back with activity or sleeping to improve QoL.    Time  4    Period  Weeks    Status  New            Plan - 01/29/19 1416    Clinical Impression Statement  Patient tolerated session well today with no increased complaint of pain. Patient able to progress to standing hip strengthening and balance activity. Patient educated on proper form and function of all added exercises. Patient cued on upright posture and avoiding RT lean during tandem stance. Patient required verbal cues for avoiding foot ER and trunk lean with standing hip abduction and extensions. Patient reported decreased lumbar stiffness post manual treatment.    Personal Factors and Comorbidities  Age  Examination-Activity Limitations  Carry;Lift;Sleep    Examination-Participation Restrictions  Driving;Interpersonal Relationship    Stability/Clinical Decision Making  Stable/Uncomplicated    Rehab Potential  Good    PT Frequency  2x / week    PT Duration  4 weeks    PT Treatment/Interventions  ADLs/Self Care Home  Management;Aquatic Therapy;Biofeedback;DME Instruction;Gait training;Stair training;Functional mobility training;Therapeutic activities;Therapeutic exercise;Balance training;Neuromuscular re-education;Patient/family education;Orthotic Fit/Training;Manual techniques;Passive range of motion;Dry needling;Taping;Joint Manipulations    PT Next Visit Plan  Continue to progress hip and core strengthening as tolerated. Avoid repeated flexion/extension and rotation exercises with compressive forces due to healing L3 compression fracture. Add palloff press next visit if tolerated.    PT Home Exercise Plan  glute set, ab set, bent knee fall out, pillow squeeze    Consulted and Agree with Plan of Care  Patient       Patient will benefit from skilled therapeutic intervention in order to improve the following deficits and impairments:  Decreased activity tolerance, Decreased balance, Decreased range of motion, Decreased strength, Increased fascial restricitons, Increased muscle spasms, Impaired perceived functional ability, Impaired flexibility, Improper body mechanics, Pain  Visit Diagnosis: Acute low back pain without sciatica, unspecified back pain laterality  Other abnormalities of gait and mobility  Other symptoms and signs involving the musculoskeletal system     Problem List Patient Active Problem List   Diagnosis Date Noted  . Malignant neoplasm of upper-inner quadrant of left breast in female, estrogen receptor positive ([place]) 03/05/2017  . History of colonic polyps 10/31/2015  . High cholesterol 10/17/2015  . SHOULDER, ARTHRITIS, DEGEN./OSTEO 02/28/2009  . CONTRACTURE OF SHOULDER JOINT 02/16/2009  . SHOULDER PAIN 02/16/2009   2:19 PM, 01/29/19 Josue Hector PT DPT  Physical Therapist with [facility]  [phone] [place] [address] [place], [place], [address] Phone: [phone]   Fax:  [phone]  Name:  Breanna Hill MRN: [id number] Date of Birth: 1940-03-11

## 2019-01-30 ENCOUNTER — Other Ambulatory Visit: Payer: Self-pay

## 2019-01-30 ENCOUNTER — Inpatient Hospital Stay: Payer: Medicare HMO | Attending: Hematology and Oncology | Admitting: Hematology and Oncology

## 2019-01-30 DIAGNOSIS — Z170 Estrogen receptor positive status [ER+]: Secondary | ICD-10-CM | POA: Diagnosis not present

## 2019-01-30 DIAGNOSIS — C50212 Malignant neoplasm of upper-inner quadrant of left female breast: Secondary | ICD-10-CM

## 2019-01-30 DIAGNOSIS — Z79899 Other long term (current) drug therapy: Secondary | ICD-10-CM | POA: Insufficient documentation

## 2019-01-30 DIAGNOSIS — Z923 Personal history of irradiation: Secondary | ICD-10-CM | POA: Insufficient documentation

## 2019-01-30 DIAGNOSIS — Z79811 Long term (current) use of aromatase inhibitors: Secondary | ICD-10-CM | POA: Insufficient documentation

## 2019-01-30 DIAGNOSIS — M1711 Unilateral primary osteoarthritis, right knee: Secondary | ICD-10-CM | POA: Diagnosis not present

## 2019-01-30 MED ORDER — LETROZOLE 2.5 MG PO TABS: 2.5000 mg | ORAL_TABLET | Freq: Every day | ORAL | 3 refills | Status: DC

## 2019-01-30 NOTE — Assessment & Plan Note (Addendum)
Left lumpectomy 02/27/2017: IDC grade 1, 1.1 cm, ALH, margins negative, 0/2 lymph nodes negative, ER 90%, PR 10%, HER-2 negative ratio 1.26, Ki-67 1%, T1CN0 stage I a  Adjuvant radiation therapy 03/27/2017-04/30/2017 Treatment plan: Adjuvant antiestrogen therapy with letrozole 2.5 mg daily x5 years started 04/30/2017 Recovered from COVID-19 infection  Letrozole toxicities: 1.  Occasional hot flashes 2.  Mild joint stiffness especially in the hands  Right knee arthritis: I encouraged her to take turmeric  Breast cancer surveillance: 1.  Mammogram 10/27/2018: Benign breast density category B 2.  Breast exam 01/30/2019: Benign 3.  Bone density will need to be performed.  Return to clinic in 1 year for follow-up

## 2019-02-02 ENCOUNTER — Telehealth: Payer: Self-pay | Admitting: Hematology and Oncology

## 2019-02-02 NOTE — Telephone Encounter (Signed)
I could not reach patient regarding schedule will mail 

## 2019-02-03 ENCOUNTER — Other Ambulatory Visit: Payer: Self-pay

## 2019-02-03 ENCOUNTER — Ambulatory Visit (HOSPITAL_COMMUNITY): Payer: Medicare HMO | Attending: Orthopedic Surgery | Admitting: Physical Therapy

## 2019-02-03 DIAGNOSIS — R29898 Other symptoms and signs involving the musculoskeletal system: Secondary | ICD-10-CM | POA: Diagnosis not present

## 2019-02-03 DIAGNOSIS — M545 Low back pain, unspecified: Secondary | ICD-10-CM

## 2019-02-03 DIAGNOSIS — R2689 Other abnormalities of gait and mobility: Secondary | ICD-10-CM | POA: Diagnosis not present

## 2019-02-03 NOTE — Therapy (Signed)
[place], [place], [address] Phone: [phone]   Fax:  [phone]  Physical Therapy Treatment  Patient Details  Name: Breanna Hill MRN: [id number] Date of Birth: 1940/09/04 Referring Provider (PT): Latanya Maudlin, MD   Encounter Date: 02/03/2019  PT End of Session - 02/03/19 1351    Visit Number  6    Number of Visits  8    Date for PT Re-Evaluation  02/10/19    Authorization Type  Primary: Aetna Medicare, Secondary: BCBS    Authorization Time Period  01/13/19 to 02/10/19    Authorization - Visit Number  6    Authorization - Number of Visits  10    PT Start Time  [id number]    PT Stop Time  [id number]    PT Time Calculation (min)  40 min    Activity Tolerance  Patient tolerated treatment well;No increased pain    Behavior During Therapy  WFL for tasks assessed/performed       Past Medical History:  Diagnosis Date  . Arthritis    R hand   . Breast disorder    cancer  . Cancer [facility])    breast CA- diagnosed with needle biopsy  . Depression   . GERD (gastroesophageal reflux disease)    rare use of tums   . High cholesterol 10/17/2015  . History of radiation therapy 03/27/17- 04/23/17   Left Breast, 2.67 Gy in 15 fractions for a total dose of 40.05 Gy. Boost, 2 Gy in 5 fractions for a total dose of 10 Gy.   Marland Kitchen Hypothyroidism   . Pre-diabetes    Hgba1C- in the 6 's , per pt., she monitors her diet    Past Surgical History:  Procedure Laterality Date  . [place]  . BREAST LUMPECTOMY Left 02/2017  . BREAST LUMPECTOMY WITH RADIOACTIVE SEED AND SENTINEL LYMPH NODE BIOPSY Left 02/27/2017   Procedure: LEFT BREAST LUMPECTOMY WITH RADIOACTIVE SEED AND LEFT AXILLARY SENTINEL LYMPH NODE BIOPSY;  Surgeon: Donnie Mesa, MD;  Location: [place];  Service: General;  Laterality: Left;  . COLONOSCOPY     X 2  . COLONOSCOPY N/A 02/22/2016   Procedure: COLONOSCOPY;  Surgeon: Rogene Houston, MD;  [facility];   Service: Endoscopy;  Laterality: N/A;  1:45  . EYE SURGERY Bilateral    phlebpheroplasty  . Left shoultder surgery     for bone spurs & frozen shoulder   . Right bunionectomy    . TONSILLECTOMY    . TUBAL LIGATION      There were no vitals filed for this visit.  Subjective Assessment - 02/03/19 1350    Subjective  States she has no current pain. States she was tired after last session.    Pertinent History  L3 compression fracture    How long can you sit comfortably?  no issues    How long can you stand comfortably?  no issues    How long can you walk comfortably?  no issues    Patient Stated Goals  that I don't hurt    Pain Onset  1 to 4 weeks ago         [facility] PT Assessment - 02/03/19 0001      Assessment   Medical Diagnosis  L3 compression fx    Referring Provider (PT)  Latanya Maudlin, MD  [place] Adult PT Treatment/Exercise - 02/03/19 0001      Lumbar Exercises: Standing   Other Standing Lumbar Exercises  standing QL stretch not tolerated       Lumbar Exercises: Seated   Other Seated Lumbar Exercises  prayer stretch x10, 10" holds, self mobilization to hand with lacrosse ball 3 minutes each hand; wristflexion stretch x5, 10" holds B    Other Seated Lumbar Exercises  trunk rotation 2x5, 5" holds B      Lumbar Exercises: Sidelying   Other Sidelying Lumbar Exercises  book stretch - knees stackes x15, 5" holds B, then top knee dropped x15, 5" holds B    Other Sidelying Lumbar Exercises  QL stretch over towel roll - not tolerated - stopped --> then attempted right shoulder abduction  - pain in shoulder stopped,             PT Education - 02/03/19 1444    Education Details  educated patient on how/why stretches will help with back pain and wrist/hand pain.    Person(s) Educated  Patient    Methods  Explanation    Comprehension  Verbalized understanding       PT Short Term Goals - 01/13/19 1217      PT SHORT TERM GOAL #1   Title   Pt will be independent with HEP, perform at least 2x/week to reduce pain with functional activity.    Time  2    Period  Weeks    Status  New    Target Date  01/27/19        PT Long Term Goals - 01/13/19 1218      PT LONG TERM GOAL #1   Title  Pt will demonstrate lumbar AROM to [facility] and pain-free in all directions to improve ability to perform household chores.    Time  4    Period  Weeks    Status  New    Target Date  02/10/19      PT LONG TERM GOAL #2   Title  Pt will demonstrate safe lifting mechanics without pain to improve pt's ability to load WC into/out of vehicle without risk for injury.    Time  4    Period  Weeks    Status  New      PT LONG TERM GOAL #3   Title  Pt will score 25% limitation on FOTO to indicate improvement in overall ability to complete household chores and improvement in QoL.    Time  4    Period  Weeks    Status  New      PT LONG TERM GOAL #4   Title  Pt will reports 5/10 pain at worst in low back with activity or sleeping to improve QoL.    Time  4    Period  Weeks    Status  New            Plan - 02/03/19 1445    Clinical Impression Statement  Focused on improving pain free lumbar mobility today. Patient tolerated side lying and seated exercises well. Patient also complained of wrist and hand pain that is aggravated with pushing her husband up a hill in his wheelchair. Provided patient with basic stretches to assist with mobility and pain. Patient tolerated these well. Will continue to focus on pain free mobility and lengthening right trunk musculature as this seems to be the source of her pain.    Personal Factors and Comorbidities  Age  Examination-Activity Limitations  Carry;Lift;Sleep    Examination-Participation Restrictions  Driving;Interpersonal Relationship    Stability/Clinical Decision Making  Stable/Uncomplicated    Rehab Potential  Good    PT Frequency  2x / week    PT Duration  4 weeks    PT Treatment/Interventions   ADLs/Self Care Home Management;Aquatic Therapy;Biofeedback;DME Instruction;Gait training;Stair training;Functional mobility training;Therapeutic activities;Therapeutic exercise;Balance training;Neuromuscular re-education;Patient/family education;Orthotic Fit/Training;Manual techniques;Passive range of motion;Dry needling;Taping;Joint Manipulations    PT Next Visit Plan  continue with painfree lumbar mobility (patient leans to the right side and right musculature very tight/limited). Add palloff press next visit if tolerated.    PT Home Exercise Plan  glute set, ab set, bent knee fall out, pillow squeeze    Consulted and Agree with Plan of Care  Patient       Patient will benefit from skilled therapeutic intervention in order to improve the following deficits and impairments:  Decreased activity tolerance, Decreased balance, Decreased range of motion, Decreased strength, Increased fascial restricitons, Increased muscle spasms, Impaired perceived functional ability, Impaired flexibility, Improper body mechanics, Pain  Visit Diagnosis: Acute low back pain without sciatica, unspecified back pain laterality  Other abnormalities of gait and mobility  Other symptoms and signs involving the musculoskeletal system     Problem List Patient Active Problem List   Diagnosis Date Noted  . Malignant neoplasm of upper-inner quadrant of left breast in female, estrogen receptor positive ([place]) 03/05/2017  . History of colonic polyps 10/31/2015  . High cholesterol 10/17/2015  . SHOULDER, ARTHRITIS, DEGEN./OSTEO 02/28/2009  . CONTRACTURE OF SHOULDER JOINT 02/16/2009  . SHOULDER PAIN 02/16/2009    2:51 PM, 02/03/19 Jerene Pitch, DPT Physical Therapy with [facility]  [phone] office  [place] [address] [place], [place], [address] Phone: [phone]   Fax:  [phone]  Name: Breanna Hill MRN: [id number] Date of Birth:  08-24-1940

## 2019-02-05 ENCOUNTER — Telehealth (HOSPITAL_COMMUNITY): Payer: Self-pay | Admitting: Physical Therapy

## 2019-02-05 ENCOUNTER — Encounter (HOSPITAL_COMMUNITY): Payer: Self-pay | Admitting: Physical Therapy

## 2019-02-05 ENCOUNTER — Other Ambulatory Visit: Payer: Self-pay

## 2019-02-05 ENCOUNTER — Ambulatory Visit (HOSPITAL_COMMUNITY): Payer: Medicare HMO | Admitting: Physical Therapy

## 2019-02-05 DIAGNOSIS — M545 Low back pain, unspecified: Secondary | ICD-10-CM

## 2019-02-05 DIAGNOSIS — R29898 Other symptoms and signs involving the musculoskeletal system: Secondary | ICD-10-CM | POA: Diagnosis not present

## 2019-02-05 DIAGNOSIS — R2689 Other abnormalities of gait and mobility: Secondary | ICD-10-CM | POA: Diagnosis not present

## 2019-02-05 NOTE — Telephone Encounter (Signed)
No Show. Called and left voicemail about missed appointment and instructions to call clinic if any questions arise.   1:36 PM, 02/05/19 Jerene Pitch, DPT Physical Therapy with [facility]  [phone] office

## 2019-02-05 NOTE — Therapy (Signed)
[facility] [address] [place], [place], [address] Phone: [phone]   Fax:  [phone]  Physical Therapy Treatment and Progress note  Patient Details  Name: Breanna Hill MRN: [id number] Date of Birth: 02-Nov-1940 Referring Provider (PT): Latanya Maudlin, MD  Progress Note Reporting Period 01/13/19 to 02/05/19  See note below for Objective Data and Assessment of Progress/Goals.      Encounter Date: 02/05/2019  PT End of Session - 02/05/19 1404    Visit Number  7    Number of Visits  8    Date for PT Re-Evaluation  02/10/19    Authorization Type  Primary: [employer] Medicare, Secondary: BCBS    Authorization Time Period  01/13/19 to 02/10/19    Authorization - Visit Number  7    Authorization - Number of Visits  10    PT Start Time  1400    PT Stop Time  1440    PT Time Calculation (min)  40 min    Activity Tolerance  Patient tolerated treatment well;No increased pain    Behavior During Therapy  WFL for tasks assessed/performed       Past Medical History:  Diagnosis Date  . Arthritis    R hand   . Breast disorder    cancer  . Cancer [facility])    breast CA- diagnosed with needle biopsy  . Depression   . GERD (gastroesophageal reflux disease)    rare use of tums   . High cholesterol 10/17/2015  . History of radiation therapy 03/27/17- 04/23/17   Left Breast, 2.67 Gy in 15 fractions for a total dose of 40.05 Gy. Boost, 2 Gy in 5 fractions for a total dose of 10 Gy.   Marland Kitchen Hypothyroidism   . Pre-diabetes    Hgba1C- in the 6 's , per pt., she monitors her diet    Past Surgical History:  Procedure Laterality Date  . [place]  . BREAST LUMPECTOMY Left 02/2017  . BREAST LUMPECTOMY WITH RADIOACTIVE SEED AND SENTINEL LYMPH NODE BIOPSY Left 02/27/2017   Procedure: LEFT BREAST LUMPECTOMY WITH RADIOACTIVE SEED AND LEFT AXILLARY SENTINEL LYMPH NODE BIOPSY;  Surgeon: Donnie Mesa, MD;  Location: [place];  Service: General;  Laterality:  Left;  . COLONOSCOPY     X 2  . COLONOSCOPY N/A 02/22/2016   Procedure: COLONOSCOPY;  Surgeon: Rogene Houston, MD;  [facility];  Service: Endoscopy;  Laterality: N/A;  1:45  . EYE SURGERY Bilateral    phlebpheroplasty  . Left shoultder surgery     for bone spurs & frozen shoulder   . Right bunionectomy    . TONSILLECTOMY    . TUBAL LIGATION      There were no vitals filed for this visit.  Subjective Assessment - 02/05/19 1403    Subjective  States she has no current pain. States that she could not find a golf ball yet. States she feelsabout 75% better. She is not hurting as much at night and some nights she doens't need pain meds. States she is very acitve in regards to taking care of her husband and lifting him tends to aggrevate his back.    Pertinent History  L3 compression fracture    How long can you sit comfortably?  no issues    How long can you stand comfortably?  no issues    How long can you walk comfortably?  no issues    Patient Stated Goals  that I don't hurt    Currently in Pain?  No/denies    Pain Onset  1 to 4 weeks ago         [facility] PT Assessment - 02/05/19 0001      Assessment   Medical Diagnosis  L3 compression fx    Referring Provider (PT)  Latanya Maudlin, MD      AROM   AROM Assessment Site  Lumbar    Lumbar Flexion  0% limited no pain     Lumbar Extension  50% limited no pain     Lumbar - Right Side Bend  0% limited no pain     Lumbar - Left Side Bend  0% limited no pain     Lumbar - Right Rotation  0% limited no pain     Lumbar - Left Rotation  0% limited no pain                    [facility] Adult PT Treatment/Exercise - 02/05/19 0001      Transfers   Transfers  Sit to Stand;Stand to Sit    Comments  PT performed on patient with gait belt and described and demonstrated off loading and how /why to perfom             PT Education - 02/05/19 1606    Education Details  on proper lifting mehcanics, on current  presentation, on possible benefits of back brace and type to look for. On practiving lifting mehcanics and transfers with husband next session. Reviewed HEP    Person(s) Educated  Patient    Methods  Explanation;Demonstration;Tactile cues    Comprehension  Verbalized understanding       PT Short Term Goals - 02/05/19 1411      PT SHORT TERM GOAL #1   Title  Pt will be independent with HEP, perform at least 2x/week to reduce pain with functional activity.    Baseline  2/4 everyday she is doin 1-4 exercises, does does something everyday    Time  2    Period  Weeks    Status  Achieved    Target Date  01/27/19        PT Long Term Goals - 02/05/19 1413      PT LONG TERM GOAL #1   Title  Pt will demonstrate lumbar AROM to [facility] and pain-free in all directions to improve ability to perform household chores.    Baseline  painfree lumbar ROM    Time  4    Period  Weeks    Status  Achieved      PT LONG TERM GOAL #2   Title  Pt will demonstrate safe lifting mechanics without pain to improve pt's ability to load WC into/out of vehicle without risk for injury.    Baseline  unable on this date    Time  4    Period  Weeks    Status  On-going      PT LONG TERM GOAL #3   Title  Pt will score 25% limitation on FOTO to indicate improvement in overall ability to complete household chores and improvement in QoL.    Baseline  2/4 47% limited    Time  4    Period  Weeks    Status  On-going      PT LONG TERM GOAL #4   Title  Pt will reports 5/10 pain at worst in low back with activity or sleeping to improve QoL.  Baseline  at worst 3/10    Time  4    Period  Weeks    Status  Achieved            Plan - 02/05/19 1609    Clinical Impression Statement  Patient present for a progress note on this date. She has made great progress and is confident in her current home exercise program. Discussed continued limitations and concerns with patient and patient is most concerned about transfers  with husband (getting him up from wheelchair, and getting him into bed) as continued aggravating factors in her back pain. Discussed possible benefits of back brace and discussed coming in for her final session for co-treatment with her husband where she learns how to safely transfer and move patient and he learns how to assist as best he can. Patient in agreement with plan. Anticipate patient to discharge after next session secondary to progress made and independence in home program.    Personal Factors and Comorbidities  Age    Examination-Activity Limitations  Carry;Lift;Sleep    Examination-Participation Restrictions  Driving;Interpersonal Relationship    Stability/Clinical Decision Making  Stable/Uncomplicated    Rehab Potential  Good    PT Frequency  2x / week    PT Duration  4 weeks    PT Treatment/Interventions  ADLs/Self Care Home Management;Aquatic Therapy;Biofeedback;DME Instruction;Gait training;Stair training;Functional mobility training;Therapeutic activities;Therapeutic exercise;Balance training;Neuromuscular re-education;Patient/family education;Orthotic Fit/Training;Manual techniques;Passive range of motion;Dry needling;Taping;Joint Manipulations    PT Next Visit Plan  continue with painfree lumbar mobility (patient leans to the right side and right musculature very tight/limited). Add palloff press next visit if tolerated.    PT Home Exercise Plan  glute set, ab set, bent knee fall out, pillow squeeze    Consulted and Agree with Plan of Care  Patient       Patient will benefit from skilled therapeutic intervention in order to improve the following deficits and impairments:  Decreased activity tolerance, Decreased balance, Decreased range of motion, Decreased strength, Increased fascial restricitons, Increased muscle spasms, Impaired perceived functional ability, Impaired flexibility, Improper body mechanics, Pain  Visit Diagnosis: Acute low back pain without sciatica, unspecified  back pain laterality  Other abnormalities of gait and mobility  Other symptoms and signs involving the musculoskeletal system     Problem List Patient Active Problem List   Diagnosis Date Noted  . Malignant neoplasm of upper-inner quadrant of left breast in female, estrogen receptor positive ([place]) 03/05/2017  . History of colonic polyps 10/31/2015  . High cholesterol 10/17/2015  . SHOULDER, ARTHRITIS, DEGEN./OSTEO 02/28/2009  . CONTRACTURE OF SHOULDER JOINT 02/16/2009  . SHOULDER PAIN 02/16/2009   4:12 PM, 02/05/19 Jerene Pitch, DPT Physical Therapy with [facility]  [phone] office  [facility] [address] [place], [place], [address] Phone: [phone]   Fax:  [phone]  Name: Breanna Hill MRN: [id number] Date of Birth: 23-Sep-1940

## 2019-02-06 ENCOUNTER — Encounter (HOSPITAL_COMMUNITY): Payer: Self-pay | Admitting: *Deleted

## 2019-02-06 ENCOUNTER — Emergency Department (HOSPITAL_COMMUNITY)
Admission: EM | Admit: 2019-02-06 | Discharge: 2019-02-06 | Disposition: A | Discharge status: Home / Self Care | Payer: Medicare HMO | Attending: Emergency Medicine | Admitting: Emergency Medicine

## 2019-02-06 ENCOUNTER — Emergency Department (HOSPITAL_COMMUNITY): Payer: Medicare HMO

## 2019-02-06 ENCOUNTER — Other Ambulatory Visit: Payer: Self-pay

## 2019-02-06 DIAGNOSIS — R0789 Other chest pain: Secondary | ICD-10-CM | POA: Diagnosis not present

## 2019-02-06 DIAGNOSIS — E039 Hypothyroidism, unspecified: Secondary | ICD-10-CM | POA: Insufficient documentation

## 2019-02-06 DIAGNOSIS — R079 Chest pain, unspecified: Secondary | ICD-10-CM

## 2019-02-06 DIAGNOSIS — Z79899 Other long term (current) drug therapy: Secondary | ICD-10-CM | POA: Insufficient documentation

## 2019-02-06 DIAGNOSIS — R7989 Other specified abnormal findings of blood chemistry: Secondary | ICD-10-CM | POA: Diagnosis not present

## 2019-02-06 LAB — CBC WITH DIFFERENTIAL/PLATELET
Abs Immature Granulocytes: 0.02 10*3/uL (ref 0.00–0.07)
Basophils Absolute: 0 10*3/uL (ref 0.0–0.1)
Basophils Relative: 1 %
Eosinophils Absolute: 0.1 10*3/uL (ref 0.0–0.5)
Eosinophils Relative: 2 %
HCT: 39 % (ref 36.0–46.0)
Hemoglobin: 13 g/dL (ref 12.0–15.0)
Immature Granulocytes: 0 %
Lymphocytes Relative: 16 %
Lymphs Abs: 0.9 10*3/uL (ref 0.7–4.0)
MCH: 30.4 pg (ref 26.0–34.0)
MCHC: 33.3 g/dL (ref 30.0–36.0)
MCV: 91.3 fL (ref 80.0–100.0)
Monocytes Absolute: 0.7 10*3/uL (ref 0.1–1.0)
Monocytes Relative: 12 %
Neutro Abs: 3.9 10*3/uL (ref 1.7–7.7)
Neutrophils Relative %: 69 %
Platelets: 236 10*3/uL (ref 150–400)
RBC: 4.27 MIL/uL (ref 3.87–5.11)
RDW: 13.2 % (ref 11.5–15.5)
WBC: 5.6 10*3/uL (ref 4.0–10.5)
nRBC: 0 % (ref 0.0–0.2)

## 2019-02-06 LAB — TROPONIN I (HIGH SENSITIVITY)
Troponin I (High Sensitivity): 4 ng/L (ref <18)
Troponin I (High Sensitivity): 5 ng/L (ref <18)

## 2019-02-06 LAB — BASIC METABOLIC PANEL
Anion gap: 10 (ref 5–15)
BUN: 19 mg/dL (ref 8–23)
CO2: 27 mmol/L (ref 22–32)
Calcium: 8.7 mg/dL — ABNORMAL LOW (ref 8.9–10.3)
Chloride: 102 mmol/L (ref 98–111)
Creatinine, Ser: 0.77 mg/dL (ref 0.44–1.00)
GFR calc Af Amer: >60 mL/min (ref >60)
GFR calc non Af Amer: >60 mL/min (ref >60)
Glucose, Bld: 148 mg/dL — ABNORMAL HIGH (ref 70–99)
Potassium: 3.7 mmol/L (ref 3.5–5.1)
Sodium: 139 mmol/L (ref 135–145)

## 2019-02-06 LAB — D-DIMER, QUANTITATIVE: D-Dimer, Quant: 0.65 ug/mL-FEU — ABNORMAL HIGH (ref 0.00–0.50)

## 2019-02-06 LAB — CBG MONITORING, ED: Glucose-Capillary: 133 mg/dL — ABNORMAL HIGH (ref 70–99)

## 2019-02-06 MED ORDER — IOHEXOL 350 MG/ML SOLN
100.0000 mL | Freq: Once | INTRAVENOUS | Status: AC | PRN
  Administered 2019-02-06: 100 mL via INTRAVENOUS

## 2019-02-06 NOTE — Discharge Instructions (Signed)
Our cardiologists will contact you about following up in their office.

## 2019-02-06 NOTE — ED Provider Notes (Signed)
Breanna Hill   CSN: [id number] Arrival date & time: 02/06/19  [id number]     History Chief Complaint  Patient presents with  . Chest Pain    Breanna Hill is a 79 y.o. female history of breast cancer on hormonal therapy (Trospium), high cholesterol, reflux, presented to emergency department with left-sided chest pain.  She reports 2 episodes of left-sided chest pain each lasting approximately 10 to 15 minutes.  Both episodes occurred when she was in bed.  The first episode occurred yesterday evening, the second episode occurred this morning at 0700.  In both instances she was at rest.  She describes a sensation of tightness in the left side of her chest.  It was 3 out of 10 in intensity.  She is currently asymptomatic.  The pain did not radiate anywhere.  She denies any associated symptoms including nausea, vomiting, lightheadedness, diaphoresis, numbness or tingling.  She is unsure if she has had similar episodes in the past but does not believe that is the case.  She denies any history of anginal symptoms.  She denies fevers, chills, cough, congestion, recent URI type symptoms.  She denies any shortness of breath.  She denies any cardiac history.  She has never seen a cardiologist.  She is reports she has not had any recent cardiac work-up in the past 10 years including stress test or echocardiogram.  She reports she is under a lot of stress.  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  Reports HLD.  No HTN.  "Borderline" diabetes.  Family hx of cardiac disease in both parents in their 48's, no one under the age of 9.  No hx of DVT or PE.     HPI     Past Medical History:  Diagnosis Date  . Arthritis    R hand   . Breast disorder    cancer  . Cancer [facility])    breast CA- diagnosed with needle biopsy  . Depression   . GERD (gastroesophageal reflux disease)    rare use of tums   . High cholesterol  10/17/2015  . History of radiation therapy 03/27/17- 04/23/17   Left Breast, 2.67 Gy in 15 fractions for a total dose of 40.05 Gy. Boost, 2 Gy in 5 fractions for a total dose of 10 Gy.   Marland Kitchen Hypothyroidism   . Pre-diabetes    Hgba1C- in the 6 's , per pt., she monitors her diet    Patient Active Problem List   Diagnosis Date Noted  . Malignant neoplasm of upper-inner quadrant of left breast in female, estrogen receptor positive ([place]) 03/05/2017  . History of colonic polyps 10/31/2015  . High cholesterol 10/17/2015  . SHOULDER, ARTHRITIS, DEGEN./OSTEO 02/28/2009  . CONTRACTURE OF SHOULDER JOINT 02/16/2009  . SHOULDER PAIN 02/16/2009    Past Surgical History:  Procedure Laterality Date  . [place]  . BREAST LUMPECTOMY Left 02/2017  . BREAST LUMPECTOMY WITH RADIOACTIVE SEED AND SENTINEL LYMPH NODE BIOPSY Left 02/27/2017   Procedure: LEFT BREAST LUMPECTOMY WITH RADIOACTIVE SEED AND LEFT AXILLARY SENTINEL LYMPH NODE BIOPSY;  Surgeon: Donnie Mesa, MD;  Location: [facility];  Service: General;  Laterality: Left;  . COLONOSCOPY     X 2  . COLONOSCOPY N/A 02/22/2016   Procedure: COLONOSCOPY;  Surgeon: Rogene Houston, MD;  [facility];  Service: Endoscopy;  Laterality: N/A;  1:45  . EYE SURGERY Bilateral  phlebpheroplasty  . Left shoultder surgery     for bone spurs & frozen shoulder   . Right bunionectomy    . TONSILLECTOMY    . TUBAL LIGATION       OB History    Gravida  2   Para  2   Term  2   Preterm      AB      Living  2     SAB      TAB      Ectopic      Multiple      Live Births              Family History  Problem Relation Age of Onset  . Colon cancer Neg Hx     Social History   Tobacco Use  . Smoking status: Never Smoker  . Smokeless tobacco: Never Used  Substance Use Topics  . Alcohol use: Yes    Comment: occasional  . Drug use: No    Home Medications Prior to Admission medications   Medication Sig Start Date End  Date Taking? Authorizing Provider  acetaminophen (TYLENOL) 325 MG tablet Take 650 mg by mouth every 6 (six) hours as needed.   Yes [provider]  atorvastatin (LIPITOR) 20 MG tablet Take 20 mg by mouth every evening.  01/22/14  Yes [provider]  escitalopram (LEXAPRO) 10 MG tablet Take 5 mg by mouth daily before breakfast.    Yes [provider]  letrozole (FEMARA) 2.5 MG tablet Take 1 tablet (2.5 mg total) by mouth daily. 01/30/19  Yes Nicholas Lose, MD  levothyroxine (SYNTHROID, LEVOTHROID) 100 MCG tablet Take 100 mcg by mouth daily before breakfast.  01/22/14  Yes [provider]  Trospium Chloride 60 MG CP24 Take 60 mg by mouth at bedtime.    Yes [provider]    Allergies    Penicillins  Review of Systems   Review of Systems  Constitutional: Negative for chills and fever.  Respiratory: Negative for cough and shortness of breath.   Cardiovascular: Positive for chest pain. Negative for palpitations.  Gastrointestinal: Negative for abdominal pain, nausea and vomiting.  Musculoskeletal: Negative for arthralgias and back pain.  Skin: Negative for pallor and rash.  Neurological: Negative for dizziness, syncope and light-headedness.  Psychiatric/Behavioral: Negative for agitation and confusion.  All other systems reviewed and are negative.   Physical Exam Updated Vital Signs BP (!) 162/59   Pulse 64   Temp 98.2 F (36.8 C) (Oral)   Resp 17   Ht 5\' 2"  (1.575 m)   Wt 72.6 kg   SpO2 93%   BMI 29.26 kg/m   Physical Exam Vitals and nursing Hill reviewed.  Constitutional:      General: She is not in acute distress.    Appearance: She is well-developed.  HENT:     Head: Normocephalic and atraumatic.  Eyes:     Conjunctiva/sclera: Conjunctivae normal.  Cardiovascular:     Rate and Rhythm: Normal rate.     Heart sounds: No murmur.  Pulmonary:     Effort: Pulmonary effort is normal. No respiratory distress.     Breath sounds:  Normal breath sounds.  Abdominal:     Palpations: Abdomen is soft.     Tenderness: There is no abdominal tenderness.  Musculoskeletal:     Cervical back: Neck supple.  Skin:    General: Skin is warm and dry.  Neurological:     General: No focal deficit present.  Mental Status: She is alert and oriented to person, place, and time.  Psychiatric:        Mood and Affect: Mood normal.        Behavior: Behavior normal.     ED Results / Procedures / Treatments   Labs (all labs ordered are listed, but only abnormal results are displayed) Labs Reviewed  BASIC METABOLIC PANEL - Abnormal; Notable for the following components:      Result Value   Glucose, Bld 148 (*)    Calcium 8.7 (*)    All other components within normal limits  D-DIMER, QUANTITATIVE (NOT AT [facility]) - Abnormal; Notable for the following components:   D-Dimer, Quant 0.65 (*)    All other components within normal limits  CBG MONITORING, ED - Abnormal; Notable for the following components:   Glucose-Capillary 133 (*)    All other components within normal limits  CBC WITH DIFFERENTIAL/PLATELET  TROPONIN I (HIGH SENSITIVITY)  TROPONIN I (HIGH SENSITIVITY)    EKG EKG Interpretation  Date/Time:  Friday February 06 2019 09:19:21 EST Ventricular Rate:  87 PR Interval:    QRS Duration: 127 QT Interval:  425 QTC Calculation: 421 R Axis:   -10 Text Interpretation: Accelerated junctional rhythm Ventricular bigeminy Left bundle branch block Baseline wander in lead(s) V5 No STEMI Confirmed by Octaviano Glow [phone]) on 02/06/2019 9:45:50 AM   Radiology DG Chest 2 View  Result Date: 02/06/2019 CLINICAL DATA:  Personal history of left breast cancer, chest pain not otherwise specified EXAM: CHEST - 2 VIEW COMPARISON:  03/01/2014 FINDINGS: Frontal and lateral views of the chest demonstrate postsurgical changes within the left breast. Cardiac silhouette is unremarkable. No airspace disease, effusion, or pneumothorax. No acute  bony abnormalities. IMPRESSION: 1. No acute intrathoracic process. Electronically Signed   By: Randa Ngo M.D.   On: 02/06/2019 10:34   CT Angio Chest PE W and/or Wo Contrast  Result Date: 02/06/2019 CLINICAL DATA:  Positive D-dimer, left-sided chest pain, history of breast cancer EXAM: CT ANGIOGRAPHY CHEST WITH CONTRAST TECHNIQUE: Multidetector CT imaging of the chest was performed using the standard protocol during bolus administration of intravenous contrast. Multiplanar CT image reconstructions and MIPs were obtained to evaluate the vascular anatomy. CONTRAST:  183mL OMNIPAQUE IOHEXOL 350 MG/ML SOLN COMPARISON:  02/06/2019 FINDINGS: Cardiovascular: This is a technically adequate evaluation of the pulmonary vasculature. No filling defects or pulmonary emboli. The heart is enlarged without pericardial effusion. Minimal atherosclerosis within the aortic arch. Mediastinum/Nodes: No enlarged mediastinal, hilar, or axillary lymph nodes. Thyroid gland, trachea, and esophagus demonstrate no significant findings. Lungs/Pleura: The central airways no acute airspace disease, effusion, or are widely pneumothorax. Patent. Upper Abdomen: No acute abnormality. Musculoskeletal: There are no acute or destructive bony lesions. Reconstructed images demonstrate no additional findings. Postsurgical changes are seen within the left breast compatible with prior lumpectomy for breast cancer. Review of the MIP images confirms the above findings. IMPRESSION: 1. No evidence of pulmonary embolus. 2. Cardiomegaly. 3. No acute airspace disease. 4.  Aortic Atherosclerosis (ICD10-I70.0). Electronically Signed   By: Randa Ngo M.D.   On: 02/06/2019 11:45    Procedures Procedures (including critical care time)  Medications Ordered in ED Medications  iohexol (OMNIPAQUE) 350 MG/ML injection 100 mL (100 mLs Intravenous Contrast Given 02/06/19 1124)    ED Course  I have reviewed the triage vital signs and the nursing  notes.  Pertinent labs & imaging results that were available during my care of the patient were reviewed by me and  considered in my medical decision making (see chart for details).  79 yo female presenting to ED with 2 episodes of chest pain.  Both occurring while in bed, waking her up.  She is currently asymptomatic.  This could well be reflux considering when the symptoms occurred.  Given her age and risk factors, I performed chest pain workup including ECG, troponin.  She has not had a cardiac workup in some years.  ECG showed ventricular bigemy, it isn't clear if LBBB pattern is new compared to prior, will discuss this with our cardiologist.  I do not see a STEMI here, and this does not sound like ACS (no exertional symptoms or history).  DDimer was positive (ordered largely due to history of breast cancer).  CT PE was negative for acute thrombus or PNA or PTX.  No enlarged lymph nodes.   Clinical Course as of Feb 05 1925  Fri Feb 06, 2019  0945 HR 37 shown in vitals suspect based on junctional pattern, her range is in fact 70-80 bpm at the bedside   [MT]  1209 IMPRESSION: 1. No evidence of pulmonary embolus. 2. Cardiomegaly. 3. No acute airspace disease. 4. Aortic Atherosclerosis (ICD10-I70.0).   [MT]  1402 Cards dr Patsi Sears reviewed case with me and ECG, he said his office will set up rapid f/u, patient wants to leave now as she needs to care for her husband, I think this is a reasonable follow up plan, return precautions were given, will discharge   [MT]    Clinical Course User Index [MT] Temple Ewart, Carola Rhine, MD   Final Clinical Impression(s) / ED Diagnoses Final diagnoses:  Chest pain, unspecified type    Rx / DC Orders ED Discharge Orders    None       Wyvonnia Dusky, MD 02/06/19 [phone]

## 2019-02-06 NOTE — ED Triage Notes (Signed)
Pt c/o left sided chest pain that lasted for approximately 10 minutes that she woke up with both yesterday and today. Denies SOB, n/v, dizziness, radiation of pain. Pt denies chest pain at this time. Describes pain as dull and rates it at 3/10 when it was occurring.

## 2019-02-10 ENCOUNTER — Encounter (HOSPITAL_COMMUNITY): Payer: Self-pay | Admitting: Physical Therapy

## 2019-02-10 ENCOUNTER — Ambulatory Visit (HOSPITAL_COMMUNITY): Payer: Medicare HMO | Admitting: Physical Therapy

## 2019-02-10 ENCOUNTER — Other Ambulatory Visit: Payer: Self-pay

## 2019-02-10 ENCOUNTER — Encounter: Payer: Self-pay | Admitting: Cardiovascular Disease

## 2019-02-10 ENCOUNTER — Encounter: Payer: Self-pay | Admitting: *Deleted

## 2019-02-10 ENCOUNTER — Ambulatory Visit (INDEPENDENT_AMBULATORY_CARE_PROVIDER_SITE_OTHER): Payer: Medicare HMO | Admitting: Cardiovascular Disease

## 2019-02-10 VITALS — BP 123/69 | HR 69 | Ht 62.0 in | Wt 164.2 lb

## 2019-02-10 DIAGNOSIS — R29898 Other symptoms and signs involving the musculoskeletal system: Secondary | ICD-10-CM

## 2019-02-10 DIAGNOSIS — E7800 Pure hypercholesterolemia, unspecified: Secondary | ICD-10-CM

## 2019-02-10 DIAGNOSIS — I499 Cardiac arrhythmia, unspecified: Secondary | ICD-10-CM | POA: Diagnosis not present

## 2019-02-10 DIAGNOSIS — R0789 Other chest pain: Secondary | ICD-10-CM

## 2019-02-10 DIAGNOSIS — M545 Low back pain, unspecified: Secondary | ICD-10-CM

## 2019-02-10 DIAGNOSIS — R2689 Other abnormalities of gait and mobility: Secondary | ICD-10-CM

## 2019-02-10 DIAGNOSIS — I517 Cardiomegaly: Secondary | ICD-10-CM | POA: Diagnosis not present

## 2019-02-10 DIAGNOSIS — R079 Chest pain, unspecified: Secondary | ICD-10-CM

## 2019-02-10 NOTE — Patient Instructions (Addendum)
Medication Instructions:  Continue all current medications.  Labwork: none  Testing/Procedures:  Your physician has requested that you have an echocardiogram. Echocardiography is a painless test that uses sound waves to create images of your heart. It provides your doctor with information about the size and shape of your heart and how well your heart's chambers and valves are working. This procedure takes approximately one hour. There are no restrictions for this procedure.  Your physician has requested that you have a lexiscan myoview. For further information please visit [email or web address]. Please follow instruction sheet, as given.  Office will contact with results via phone or letter.    Follow-Up: 3 months   Any Other Special Instructions Will Be Listed Below (If Applicable).  If you need a refill on your cardiac medications before your next appointment, please call your pharmacy.

## 2019-02-10 NOTE — Therapy (Signed)
[facility] [address] [place], [place], [address] Phone: [phone]   Fax:  [phone]  Physical Therapy Treatment/ Discharge Summary  Patient Details  Name: Breanna Hill MRN: [id number] Date of Birth: 1940-10-16 Referring Provider (PT): Latanya Maudlin, MD   Encounter Date: 02/10/2019   PHYSICAL THERAPY DISCHARGE SUMMARY  Visits from Start of Care: 8  Current functional level related to goals / functional outcomes: See below   Remaining deficits: See below   Education / Equipment: See assessment  Plan: Patient agrees to discharge.  Patient goals were met. Patient is being discharged due to meeting the stated rehab goals.  ?????       PT End of Session - 02/10/19 1331    Visit Number  8    Number of Visits  8    Date for PT Re-Evaluation  02/10/19    Authorization Type  Primary: [employer] Medicare, Secondary: BCBS    Authorization Time Period  01/13/19 to 02/10/19    Authorization - Visit Number  8    Authorization - Number of Visits  10    PT Start Time  1325    PT Stop Time  1345    PT Time Calculation (min)  20 min    Activity Tolerance  Patient tolerated treatment well;No increased pain    Behavior During Therapy  WFL for tasks assessed/performed       Past Medical History:  Diagnosis Date  . Arthritis    R hand   . Breast disorder    cancer  . Cancer [facility])    breast CA- diagnosed with needle biopsy  . Depression   . GERD (gastroesophageal reflux disease)    rare use of tums   . High cholesterol 10/17/2015  . History of radiation therapy 03/27/17- 04/23/17   Left Breast, 2.67 Gy in 15 fractions for a total dose of 40.05 Gy. Boost, 2 Gy in 5 fractions for a total dose of 10 Gy.   Marland Kitchen Hypothyroidism   . Pre-diabetes    Hgba1C- in the 6 's , per pt., she monitors her diet    Past Surgical History:  Procedure Laterality Date  . [place]  . BREAST LUMPECTOMY Left 02/2017  . BREAST LUMPECTOMY WITH  RADIOACTIVE SEED AND SENTINEL LYMPH NODE BIOPSY Left 02/27/2017   Procedure: LEFT BREAST LUMPECTOMY WITH RADIOACTIVE SEED AND LEFT AXILLARY SENTINEL LYMPH NODE BIOPSY;  Surgeon: Donnie Mesa, MD;  Location: [facility];  Service: General;  Laterality: Left;  . COLONOSCOPY     X 2  . COLONOSCOPY N/A 02/22/2016   Procedure: COLONOSCOPY;  Surgeon: Rogene Houston, MD;  [facility];  Service: Endoscopy;  Laterality: N/A;  1:45  . EYE SURGERY Bilateral    phlebpheroplasty  . Left shoultder surgery     for bone spurs & frozen shoulder   . Right bunionectomy    . TONSILLECTOMY    . TUBAL LIGATION      There were no vitals filed for this visit.  Subjective Assessment - 02/10/19 1327    Subjective  Patient says her back is feeling much better, notes it did not hurt getting into bed last night. Says she has not had to take sleep aides at night. Says she has not gotten back brace yet due to busy schedule. Says she was in the hospital on Friday with chest pain but was cleared and DC, is having stress text this week and has follow  up with cardiologist. Reports no pain currentlty.    Pertinent History  L3 compression fracture    How long can you sit comfortably?  no issues    How long can you stand comfortably?  no issues    How long can you walk comfortably?  no issues    Patient Stated Goals  that I don't hurt    Currently in Pain?  No/denies    Pain Onset  1 to 4 weeks ago                               PT Education - 02/10/19 1347    Education Details  Patient educated on proper lifting technique and body mechanics with transferring her husband from wheelchair to mat, and sit to stand form mat to chair transfer. Educated patient on lifting technique and form with lifting wheelchair to and from car. Patient also educated on benefit of lumbar stabilization belt and continued core strengthening for protecting lumbar and avoiding future injury.    Person(s) Educated   Patient;Spouse    Methods  Explanation    Comprehension  Verbalized understanding;Returned demonstration       PT Short Term Goals - 02/05/19 1411      PT SHORT TERM GOAL #1   Title  Pt will be independent with HEP, perform at least 2x/week to reduce pain with functional activity.    Baseline  2/4 everyday she is doin 1-4 exercises, does does something everyday    Time  2    Period  Weeks    Status  Achieved    Target Date  01/27/19        PT Long Term Goals - 02/10/19 1809      PT LONG TERM GOAL #1   Title  Pt will demonstrate lumbar AROM to [facility] and pain-free in all directions to improve ability to perform household chores.    Baseline  painfree lumbar ROM    Time  4    Period  Weeks    Status  Achieved      PT LONG TERM GOAL #2   Title  Pt will demonstrate safe lifting mechanics without pain to improve pt's ability to load WC into/out of vehicle without risk for injury.    Baseline  unable on this date    Time  4    Period  Weeks    Status  Achieved      PT LONG TERM GOAL #3   Title  Pt will score 25% limitation on FOTO to indicate improvement in overall ability to complete household chores and improvement in QoL.    Baseline  2/4 47% limited    Time  4    Period  Weeks    Status  Not Met      PT LONG TERM GOAL #4   Title  Pt will reports 5/10 pain at worst in low back with activity or sleeping to improve QoL.    Baseline  at worst 3/10    Time  4    Period  Weeks    Status  Achieved            Plan - 02/10/19 1804    Clinical Impression Statement  Spent session reviewing and educating patient and her husband on proper lifting techniques and body mechanics with lifting patient, wheelchair and transfers. Educated patient on angling wheelchair closer to bed prior to performing transfers,  body positioning, and using RW in front to add stability to patient once he is standing to facilitate transfer. Patient showed good return with cues for instructing patient on  hand and foot placement as well as safety awareness. Patient cued on core activation, lifting with legs and keeping lumbar straight and stable when lifting wheelchair from ground to car trunk. Also educated patient on importance of continuation of HEP for core strength and benefit of lumbar stability brace for added protection when lifting. Patient being DC from therapy today to transition to IND with HEP. Patient instructed to follow up with therapy services with any further questions or concerns.    Personal Factors and Comorbidities  Age    Examination-Activity Limitations  Carry;Lift;Sleep    Examination-Participation Restrictions  Driving;Interpersonal Relationship    Stability/Clinical Decision Making  Stable/Uncomplicated    Rehab Potential  Good    PT Treatment/Interventions  ADLs/Self Care Home Management;Aquatic Therapy;Biofeedback;DME Instruction;Gait training;Stair training;Functional mobility training;Therapeutic activities;Therapeutic exercise;Balance training;Neuromuscular re-education;Patient/family education;Orthotic Fit/Training;Manual techniques;Passive range of motion;Dry needling;Taping;Joint Manipulations    PT Next Visit Plan  DC to home program    PT Home Exercise Plan  glute set, ab set, bent knee fall out, pillow squeeze    Consulted and Agree with Plan of Care  Patient       Patient will benefit from skilled therapeutic intervention in order to improve the following deficits and impairments:  Decreased activity tolerance, Decreased balance, Decreased range of motion, Decreased strength, Increased fascial restricitons, Increased muscle spasms, Impaired perceived functional ability, Impaired flexibility, Improper body mechanics, Pain  Visit Diagnosis: Acute low back pain without sciatica, unspecified back pain laterality  Other abnormalities of gait and mobility  Other symptoms and signs involving the musculoskeletal system     Problem List Patient Active Problem  List   Diagnosis Date Noted  . Malignant neoplasm of upper-inner quadrant of left breast in female, estrogen receptor positive ([place]) 03/05/2017  . History of colonic polyps 10/31/2015  . High cholesterol 10/17/2015  . SHOULDER, ARTHRITIS, DEGEN./OSTEO 02/28/2009  . CONTRACTURE OF SHOULDER JOINT 02/16/2009  . SHOULDER PAIN 02/16/2009   6:13 PM, 02/10/19 Josue Hector PT DPT  Physical Therapist with [facility]  [phone] [place] [address] [place], [place], [address] Phone: [phone]   Fax:  [phone]  Name: MARKEIA HARKLESS MRN: [id number] Date of Birth: [birth year]

## 2019-02-10 NOTE — Progress Notes (Signed)
CARDIOLOGY CONSULT NOTE  Patient ID: Breanna Hill MRN: [id number] DOB/AGE: [birth year] 79 y.o.  Admit date: (Not on file) Primary Physician: Breanna Noble, MD  Reason for Consultation: Chest pain  HPI: Breanna Hill is a 79 y.o. female who is being seen today for the evaluation of chest pain at the request of Breanna Noble, MD.  Past medical history includes hyperlipidemia and breast cancer.  She was evaluated in the ED for chest pain on 02/06/2019. I have personally reviewed all documentation, labs, radiographic and cardiovascular studies, and independently interpreted all ECG's.  She reportedly had 2 episodes of left-sided chest pain each lasting approximately 10 to 15 minutes while she was in bed.  There was no radiation and no associated shortness of breath or diaphoresis.  Her husband, Breanna Hill, is also my patient.  She is under a lot of stress taking care of him as he has advanced dementia and Parkinson's disease.  BP was elevated at 162/59 with a heart rate of 64 in the ED.  High-sensitivity troponins were normal.  D-dimer was mildly elevated at 0.65.  CBC and basic metabolic panel were unremarkable.  CT angiography of the chest showed minimal atherosclerosis within the aortic arch.  There was no evidence of pulmonary embolus.  Cardiomegaly was seen.  I personally reviewed ECG which had baseline artifact but appeared to demonstrate sinus rhythm versus an accelerated junctional rhythm with underlying left bundle branch block and bigeminy.  I compared this to the ECG performed on 03/01/2014 which demonstrated a nonspecific IVCD.  I personally reviewed the ECG performed the office today which demonstrates sinus rhythm with nonspecific IVCD and PACs and PVCs.  She is here with her brother.  She tells me she has had chest pain on and off for years.  It was previously attributed to musculoskeletal issues as it occurred after raking which he tries to avoid.  Her husband is in a  wheelchair and she lifts him at a minimum of 3-4 times per day and he weighs 175 pounds.  She denies exertional chest pain or dyspnea.  She does feel more fatigued but says she has been doing a lot more over the past year she does all the shopping and goes out to get medications for him as well as the cooking and cleaning.  She denies orthopnea and paroxysmal nocturnal dyspnea as well as palpitations.  Family history: Both father and mother had angina in their 36s but both were heavy smokers.   Allergies  Allergen Reactions  . Penicillins Rash    Has patient had a PCN reaction causing immediate rash, facial/tongue/throat swelling, SOB or lightheadedness with hypotension: No Has patient had a PCN reaction causing severe rash involving mucus membranes or skin necrosis: No Has patient had a PCN reaction that required hospitalization: No Has patient had a PCN reaction occurring within the last 10 years: No If all of the above answers are "NO", then may proceed with Cephalosporin use.     Current Outpatient Medications  Medication Sig Dispense Refill  . acetaminophen (TYLENOL) 325 MG tablet Take 650 mg by mouth every 6 (six) hours as needed.    Marland Kitchen atorvastatin (LIPITOR) 20 MG tablet Take 20 mg by mouth every evening.     . escitalopram (LEXAPRO) 10 MG tablet Take 5 mg by mouth daily before breakfast.     . letrozole (FEMARA) 2.5 MG tablet Take 1 tablet (2.5 mg total) by mouth daily. 90 tablet 3  .  levothyroxine (SYNTHROID, LEVOTHROID) 100 MCG tablet Take 100 mcg by mouth daily before breakfast.     . Trospium Chloride 60 MG CP24 Take 60 mg by mouth at bedtime.      No current facility-administered medications for this visit.    Past Medical History:  Diagnosis Date  . Arthritis    R hand   . Breast disorder    cancer  . Cancer [facility])    breast CA- diagnosed with needle biopsy  . Depression   . GERD (gastroesophageal reflux disease)    rare use of tums   . High cholesterol 10/17/2015   . History of radiation therapy 03/27/17- 04/23/17   Left Breast, 2.67 Gy in 15 fractions for a total dose of 40.05 Gy. Boost, 2 Gy in 5 fractions for a total dose of 10 Gy.   Marland Kitchen Hypothyroidism   . Pre-diabetes    Hgba1C- in the 6 's , per pt., she monitors her diet    Past Surgical History:  Procedure Laterality Date  . [place]  . BREAST LUMPECTOMY Left 02/2017  . BREAST LUMPECTOMY WITH RADIOACTIVE SEED AND SENTINEL LYMPH NODE BIOPSY Left 02/27/2017   Procedure: LEFT BREAST LUMPECTOMY WITH RADIOACTIVE SEED AND LEFT AXILLARY SENTINEL LYMPH NODE BIOPSY;  Surgeon: Donnie Mesa, MD;  Location: [facility];  Service: General;  Laterality: Left;  . COLONOSCOPY     X 2  . COLONOSCOPY N/A 02/22/2016   Procedure: COLONOSCOPY;  Surgeon: Rogene Houston, MD;  [facility];  Service: Endoscopy;  Laterality: N/A;  1:45  . EYE SURGERY Bilateral    phlebpheroplasty  . Left shoultder surgery     for bone spurs & frozen shoulder   . Right bunionectomy    . TONSILLECTOMY    . TUBAL LIGATION      Social History   Socioeconomic History  . Marital status: Married    Spouse name: Not on file  . Number of children: Not on file  . Years of education: Not on file  . Highest education level: Not on file  Occupational History  . Not on file  Tobacco Use  . Smoking status: Never Smoker  . Smokeless tobacco: Never Used  Substance and Sexual Activity  . Alcohol use: Yes    Comment: occasional  . Drug use: No  . Sexual activity: Not Currently    Birth control/protection: Post-menopausal  Other Topics Concern  . Not on file  Social History Narrative  . Not on file   Social Determinants of Health   Financial Resource Strain:   . Difficulty of Paying Living Expenses: Not on file  Food Insecurity:   . Worried About Charity fundraiser in the Last Year: Not on file  . Ran Out of Food in the Last Year: Not on file  Transportation Needs:   . Lack of Transportation (Medical): Not on  file  . Lack of Transportation (Non-Medical): Not on file  Physical Activity:   . Days of Exercise per Week: Not on file  . Minutes of Exercise per Session: Not on file  Stress:   . Feeling of Stress : Not on file  Social Connections:   . Frequency of Communication with Friends and Family: Not on file  . Frequency of Social Gatherings with Friends and Family: Not on file  . Attends Religious Services: Not on file  . Active Member of Clubs or Organizations: Not on file  . Attends Archivist Meetings: Not  on file  . Marital Status: Not on file  Intimate Partner Violence:   . Fear of Current or Ex-Partner: Not on file  . Emotionally Abused: Not on file  . Physically Abused: Not on file  . Sexually Abused: Not on file       Current Meds  Medication Sig  . acetaminophen (TYLENOL) 325 MG tablet Take 650 mg by mouth every 6 (six) hours as needed.  Marland Kitchen atorvastatin (LIPITOR) 20 MG tablet Take 20 mg by mouth every evening.   . escitalopram (LEXAPRO) 10 MG tablet Take 5 mg by mouth daily before breakfast.   . letrozole (FEMARA) 2.5 MG tablet Take 1 tablet (2.5 mg total) by mouth daily.  Marland Kitchen levothyroxine (SYNTHROID, LEVOTHROID) 100 MCG tablet Take 100 mcg by mouth daily before breakfast.   . Trospium Chloride 60 MG CP24 Take 60 mg by mouth at bedtime.       Review of systems complete and found to be negative unless listed above in HPI    Physical exam Blood pressure 123/69, pulse 69, height 5\' 2"  (1.575 m), weight 164 lb 3.2 oz (74.5 kg), SpO2 96 %. General: NAD Neck: No JVD, no thyromegaly or thyroid nodule.  Lungs: Clear to auscultation bilaterally with normal respiratory effort. CV: Nondisplaced PMI. Regular rate and rhythm with premature contractions, normal S1/S2, no [id number], 1/6 systolic murmur over RUSB.  No peripheral edema.  No carotid bruit.    Abdomen: Soft, nontender, no distention.  Skin: Intact without lesions or rashes.  Neurologic: Alert and oriented x 3.    Psych: Normal affect. Extremities: No clubbing or cyanosis.  HEENT: Normal.   ECG: Most recent ECG reviewed.   Labs: Lab Results  Component Value Date/Time   K 3.7 02/06/2019 09:36 AM   BUN 19 02/06/2019 09:36 AM   CREATININE 0.77 02/06/2019 09:36 AM   ALT 27 03/01/2014 09:00 AM   HGB 13.0 02/06/2019 09:36 AM     Lipids: No results found for: LDLCALC, LDLDIRECT, CHOL, TRIG, HDL      ASSESSMENT AND PLAN:   1.  Chest pain: Given cardiomegaly seen on chest CT I will obtain an echocardiogram to evaluate cardiac structure and function.  Symptoms represent a mixed picture.  She has a nonspecific IVCD.  I will proceed with a nuclear myocardial perfusion imaging study to evaluate for ischemic heart disease (Lexiscan Myoview).  Symptoms could be very well due to high levels of anxiety and stress taking care of her husband.  2.  Hyperlipidemia: On statin therapy.  3.  Arrhythmia: I have obtained a follow-up ECG today in the office which demonstrates sinus rhythm with nonspecific IVCD with PACs and PVCs.   Disposition: Follow up in 3 months virtual visit   Signed: Kate Sable, M.D., F.A.C.C.  02/10/2019, 10:00 AM

## 2019-02-11 ENCOUNTER — Telehealth (HOSPITAL_COMMUNITY): Payer: Self-pay | Admitting: Physical Therapy

## 2019-02-12 ENCOUNTER — Telehealth: Payer: Self-pay | Admitting: *Deleted

## 2019-02-12 ENCOUNTER — Ambulatory Visit (HOSPITAL_COMMUNITY): Payer: Medicare HMO | Admitting: Physical Therapy

## 2019-02-12 NOTE — Telephone Encounter (Signed)
Called pt earlier this morning to see if he would be able to come to a 1:45  Appointment rather than 2:00. Pt was due for a reassessment and original therapist had openings.  Therapist that pt was to see had never seen pt before,  Patients wife agreed to time change.  Rayetta Humphrey, [place] CLT [phone]

## 2019-02-12 NOTE — Telephone Encounter (Signed)
The patient verbally consented for a telehealth phone visit with [facility] and understands that his/her insurance company will be billed for the encounter.  Will have vitals & medications.   

## 2019-02-19 ENCOUNTER — Other Ambulatory Visit: Payer: Medicare HMO

## 2019-02-24 DIAGNOSIS — H5203 Hypermetropia, bilateral: Secondary | ICD-10-CM | POA: Diagnosis not present

## 2019-03-02 ENCOUNTER — Encounter (HOSPITAL_COMMUNITY)
Admission: RE | Admit: 2019-03-02 | Discharge: 2019-03-02 | Disposition: A | Discharge status: Home / Self Care | Payer: Medicare HMO | Source: Ambulatory Visit | Attending: Cardiovascular Disease | Admitting: Cardiovascular Disease

## 2019-03-02 ENCOUNTER — Ambulatory Visit (HOSPITAL_COMMUNITY)
Admission: RE | Admit: 2019-03-02 | Discharge: 2019-03-02 | Disposition: A | Discharge status: Home / Self Care | Payer: Medicare HMO | Source: Ambulatory Visit | Attending: Cardiovascular Disease | Admitting: Cardiovascular Disease

## 2019-03-02 ENCOUNTER — Other Ambulatory Visit: Payer: Self-pay

## 2019-03-02 DIAGNOSIS — R079 Chest pain, unspecified: Secondary | ICD-10-CM

## 2019-03-02 DIAGNOSIS — R0789 Other chest pain: Secondary | ICD-10-CM | POA: Insufficient documentation

## 2019-03-02 LAB — NM MYOCAR MULTI W/SPECT W/WALL MOTION / EF
LV dias vol: 78 mL (ref 46–106)
LV sys vol: 30 mL
Peak HR: 81 {beats}/min
RATE: 0.31
Rest HR: 64 {beats}/min
SDS: 0
SRS: 5
SSS: 5
TID: 1.14

## 2019-03-02 MED ORDER — TECHNETIUM TC 99M TETROFOSMIN IV KIT
30.0000 | PACK | Freq: Once | INTRAVENOUS | Status: AC | PRN
  Administered 2019-03-02: 33 via INTRAVENOUS

## 2019-03-02 MED ORDER — REGADENOSON 0.4 MG/5ML IV SOLN
INTRAVENOUS | Status: AC
  Administered 2019-03-02: 0.4 mg via INTRAVENOUS
  Filled 2019-03-02: qty 5

## 2019-03-02 MED ORDER — SODIUM CHLORIDE FLUSH 0.9 % IV SOLN
INTRAVENOUS | Status: AC
  Administered 2019-03-02: 10 mL via INTRAVENOUS
  Filled 2019-03-02: qty 10

## 2019-03-02 MED ORDER — TECHNETIUM TC 99M TETROFOSMIN IV KIT
10.0000 | PACK | Freq: Once | INTRAVENOUS | Status: AC | PRN
  Administered 2019-03-02: 10.87 via INTRAVENOUS

## 2019-03-06 DIAGNOSIS — M25561 Pain in right knee: Secondary | ICD-10-CM | POA: Diagnosis not present

## 2019-03-06 DIAGNOSIS — M545 Low back pain: Secondary | ICD-10-CM | POA: Diagnosis not present

## 2019-03-06 DIAGNOSIS — M25562 Pain in left knee: Secondary | ICD-10-CM | POA: Diagnosis not present

## 2019-03-10 ENCOUNTER — Ambulatory Visit: Payer: Medicare HMO | Admitting: Cardiovascular Disease

## 2019-03-13 DIAGNOSIS — M25562 Pain in left knee: Secondary | ICD-10-CM | POA: Diagnosis not present

## 2019-03-13 DIAGNOSIS — M25561 Pain in right knee: Secondary | ICD-10-CM | POA: Diagnosis not present

## 2019-03-18 ENCOUNTER — Other Ambulatory Visit: Payer: Self-pay

## 2019-03-18 ENCOUNTER — Ambulatory Visit (INDEPENDENT_AMBULATORY_CARE_PROVIDER_SITE_OTHER): Payer: Medicare HMO

## 2019-03-18 ENCOUNTER — Telehealth: Payer: Self-pay | Admitting: *Deleted

## 2019-03-18 DIAGNOSIS — R079 Chest pain, unspecified: Secondary | ICD-10-CM | POA: Diagnosis not present

## 2019-03-18 DIAGNOSIS — I499 Cardiac arrhythmia, unspecified: Secondary | ICD-10-CM

## 2019-03-18 NOTE — Telephone Encounter (Signed)
-----   Message from Herminio Commons, MD sent at 03/18/2019  2:06 PM EDT ----- Normal pumping function

## 2019-03-18 NOTE — Telephone Encounter (Signed)
Laurine Blazer, [place]  [phone] [id number] PM EDT    Patient notified. Copy to pmd. Follow up scheduled for May with Dr. Bronson Ing.

## 2019-03-30 DIAGNOSIS — E785 Hyperlipidemia, unspecified: Secondary | ICD-10-CM | POA: Diagnosis not present

## 2019-03-30 DIAGNOSIS — Z853 Personal history of malignant neoplasm of breast: Secondary | ICD-10-CM | POA: Diagnosis not present

## 2019-03-30 DIAGNOSIS — E039 Hypothyroidism, unspecified: Secondary | ICD-10-CM | POA: Diagnosis not present

## 2019-03-30 DIAGNOSIS — E1169 Type 2 diabetes mellitus with other specified complication: Secondary | ICD-10-CM | POA: Diagnosis not present

## 2019-03-30 DIAGNOSIS — E119 Type 2 diabetes mellitus without complications: Secondary | ICD-10-CM | POA: Diagnosis not present

## 2019-04-13 DIAGNOSIS — C50912 Malignant neoplasm of unspecified site of left female breast: Secondary | ICD-10-CM | POA: Diagnosis not present

## 2019-04-27 DIAGNOSIS — Z0100 Encounter for examination of eyes and vision without abnormal findings: Secondary | ICD-10-CM | POA: Diagnosis not present

## 2019-05-18 ENCOUNTER — Other Ambulatory Visit: Payer: Self-pay

## 2019-05-18 ENCOUNTER — Encounter: Payer: Self-pay | Admitting: Cardiovascular Disease

## 2019-05-18 ENCOUNTER — Ambulatory Visit (INDEPENDENT_AMBULATORY_CARE_PROVIDER_SITE_OTHER): Payer: Medicare HMO | Admitting: Cardiovascular Disease

## 2019-05-18 VITALS — BP 132/64 | HR 63 | Ht 63.0 in | Wt 160.8 lb

## 2019-05-18 DIAGNOSIS — E7800 Pure hypercholesterolemia, unspecified: Secondary | ICD-10-CM | POA: Diagnosis not present

## 2019-05-18 DIAGNOSIS — R0789 Other chest pain: Secondary | ICD-10-CM | POA: Diagnosis not present

## 2019-05-18 DIAGNOSIS — I499 Cardiac arrhythmia, unspecified: Secondary | ICD-10-CM

## 2019-05-18 NOTE — Progress Notes (Signed)
SUBJECTIVE: The patient returns for follow-up after undergoing cardiovascular testing performed for the evaluation of chest pain.  Nuclear stress test on 03/02/2019 demonstrated soft tissue attenuation with no significant ischemia and was a low risk study overall.  Echocardiogram on 03/18/2019 demonstrated normal LV systolic function and regional wall motion, LVEF 55 to 60%.  There was mild LVH and grade 1 diastolic dysfunction.  She wanted to speak about her husband, Jearld Fenton, who is also my patient.  He has advanced Parkinson's disease.  He has had a change in mental status over the last week.  He has developed bedsores and he has been coughing more.  He has not been drinking or eating for the last several days.  She said he has not had any fevers.  He has had elevated heart rates as well.  We discussed the results of her cardiac testing.  She thinks her symptoms are likely related to the stress of taking care of her husband.   Review of Systems: As per "subjective", otherwise negative.  Allergies  Allergen Reactions  . Penicillins Rash    Has patient had a PCN reaction causing immediate rash, facial/tongue/throat swelling, SOB or lightheadedness with hypotension: No Has patient had a PCN reaction causing severe rash involving mucus membranes or skin necrosis: No Has patient had a PCN reaction that required hospitalization: No Has patient had a PCN reaction occurring within the last 10 years: No If all of the above answers are "NO", then may proceed with Cephalosporin use.     Current Outpatient Medications  Medication Sig Dispense Refill  . acetaminophen (TYLENOL) 325 MG tablet Take 650 mg by mouth every 6 (six) hours as needed.    Marland Kitchen atorvastatin (LIPITOR) 20 MG tablet Take 20 mg by mouth every evening.     . escitalopram (LEXAPRO) 10 MG tablet Take 5 mg by mouth daily before breakfast.     . letrozole (FEMARA) 2.5 MG tablet Take 1 tablet (2.5 mg total) by mouth daily. 90 tablet  3  . levothyroxine (SYNTHROID, LEVOTHROID) 100 MCG tablet Take 100 mcg by mouth daily before breakfast.     . Trospium Chloride 60 MG CP24 Take 60 mg by mouth at bedtime.      No current facility-administered medications for this visit.    Past Medical History:  Diagnosis Date  . Arthritis    R hand   . Breast disorder    cancer  . Cancer [facility])    breast CA- diagnosed with needle biopsy  . Depression   . GERD (gastroesophageal reflux disease)    rare use of tums   . High cholesterol 10/17/2015  . History of radiation therapy 03/27/17- 04/23/17   Left Breast, 2.67 Gy in 15 fractions for a total dose of 40.05 Gy. Boost, 2 Gy in 5 fractions for a total dose of 10 Gy.   Marland Kitchen Hypothyroidism   . Pre-diabetes    Hgba1C- in the 6 's , per pt., she monitors her diet    Past Surgical History:  Procedure Laterality Date  . [place]  . BREAST LUMPECTOMY Left 02/2017  . BREAST LUMPECTOMY WITH RADIOACTIVE SEED AND SENTINEL LYMPH NODE BIOPSY Left 02/27/2017   Procedure: LEFT BREAST LUMPECTOMY WITH RADIOACTIVE SEED AND LEFT AXILLARY SENTINEL LYMPH NODE BIOPSY;  Surgeon: Donnie Mesa, MD;  Location: [place];  Service: General;  Laterality: Left;  . COLONOSCOPY     X 2  . COLONOSCOPY N/A 02/22/2016   Procedure:  COLONOSCOPY;  Surgeon: Rogene Houston, MD;  [facility];  Service: Endoscopy;  Laterality: N/A;  1:45  . EYE SURGERY Bilateral    phlebpheroplasty  . Left shoultder surgery     for bone spurs & frozen shoulder   . Right bunionectomy    . TONSILLECTOMY    . TUBAL LIGATION      Social History   Socioeconomic History  . Marital status: Married    Spouse name: Not on file  . Number of children: Not on file  . Years of education: Not on file  . Highest education level: Not on file  Occupational History  . Not on file  Tobacco Use  . Smoking status: Never Smoker  . Smokeless tobacco: Never Used  Substance and Sexual Activity  . Alcohol use: Yes    Comment:  occasional  . Drug use: No  . Sexual activity: Not Currently    Birth control/protection: Post-menopausal  Other Topics Concern  . Not on file  Social History Narrative  . Not on file   Social Determinants of Health   Financial Resource Strain:   . Difficulty of Paying Living Expenses:   Food Insecurity:   . Worried About Charity fundraiser in the Last Year:   . Arboriculturist in the Last Year:   Transportation Needs:   . Film/video editor (Medical):   Marland Kitchen Lack of Transportation (Non-Medical):   Physical Activity:   . Days of Exercise per Week:   . Minutes of Exercise per Session:   Stress:   . Feeling of Stress :   Social Connections:   . Frequency of Communication with Friends and Family:   . Frequency of Social Gatherings with Friends and Family:   . Attends Religious Services:   . Active Member of Clubs or Organizations:   . Attends Archivist Meetings:   Marland Kitchen Marital Status:   Intimate Partner Violence:   . Fear of Current or Ex-Partner:   . Emotionally Abused:   Marland Kitchen Physically Abused:   . Sexually Abused:     Orson Slick, LPN was present throughout the entirety of the encounter.  Vitals:   05/18/19 1131  BP: 132/64  Pulse: 63  SpO2: 97%  Weight: 160 lb 12.8 oz (72.9 kg)  Height: 5\' 3"  (1.6 m)    Wt Readings from Last 3 Encounters:  05/18/19 160 lb 12.8 oz (72.9 kg)  02/10/19 164 lb 3.2 oz (74.5 kg)  02/06/19 160 lb (72.6 kg)     PHYSICAL EXAM General: NAD HEENT: Normal. Neck: No JVD, no thyromegaly. Lungs: Clear to auscultation bilaterally with normal respiratory effort. CV: Regular rate and rhythm, normal S1/S2, no S3/S4, no murmur. No pretibial or periankle edema.   Abdomen: Soft, nontender, no distention.  Neurologic: Alert and oriented.  Psych: Normal affect. Skin: Normal. Musculoskeletal: No gross deformities.      Labs: Lab Results  Component Value Date/Time   K 3.7 02/06/2019 09:36 AM   BUN 19 02/06/2019 09:36 AM    CREATININE 0.77 02/06/2019 09:36 AM   ALT 27 03/01/2014 09:00 AM   HGB 13.0 02/06/2019 09:36 AM     Lipids: No results found for: LDLCALC, LDLDIRECT, CHOL, TRIG, HDL     ASSESSMENT AND PLAN:  1.  Chest pain: Nuclear stress test was low risk with no significant ischemic territories.  Symptoms may be related to high levels of stress and anxiety given that her husband requires high levels of  care.  2.  Hyperlipidemia: On statin therapy.  3.  Arrhythmia: ECG demonstrated sinus rhythm with nonspecific IVCD with PACs and PVCs.  Asymptomatic.   Disposition: Follow up virtual visit 3 months   Kate Sable, M.D., F.A.C.C.

## 2019-05-18 NOTE — Patient Instructions (Addendum)
Medication Instructions:  Continue all current medications.  Labwork: none  Testing/Procedures: none  Follow-Up: 3 months   Any Other Special Instructions Will Be Listed Below (If Applicable).  If you need a refill on your cardiac medications before your next appointment, please call your pharmacy.  

## 2019-06-26 DIAGNOSIS — M546 Pain in thoracic spine: Secondary | ICD-10-CM | POA: Diagnosis not present

## 2019-06-26 DIAGNOSIS — M545 Low back pain: Secondary | ICD-10-CM | POA: Diagnosis not present

## 2019-06-26 DIAGNOSIS — M9902 Segmental and somatic dysfunction of thoracic region: Secondary | ICD-10-CM | POA: Diagnosis not present

## 2019-06-26 DIAGNOSIS — M9905 Segmental and somatic dysfunction of pelvic region: Secondary | ICD-10-CM | POA: Diagnosis not present

## 2019-06-26 DIAGNOSIS — M9903 Segmental and somatic dysfunction of lumbar region: Secondary | ICD-10-CM | POA: Diagnosis not present

## 2019-07-07 DIAGNOSIS — L814 Other melanin hyperpigmentation: Secondary | ICD-10-CM | POA: Diagnosis not present

## 2019-07-07 DIAGNOSIS — D239 Other benign neoplasm of skin, unspecified: Secondary | ICD-10-CM | POA: Diagnosis not present

## 2019-07-07 DIAGNOSIS — L570 Actinic keratosis: Secondary | ICD-10-CM | POA: Diagnosis not present

## 2019-07-16 DIAGNOSIS — K591 Functional diarrhea: Secondary | ICD-10-CM | POA: Diagnosis not present

## 2019-07-16 DIAGNOSIS — N3000 Acute cystitis without hematuria: Secondary | ICD-10-CM | POA: Diagnosis not present

## 2019-07-16 DIAGNOSIS — N390 Urinary tract infection, site not specified: Secondary | ICD-10-CM | POA: Diagnosis not present

## 2019-07-16 DIAGNOSIS — R197 Diarrhea, unspecified: Secondary | ICD-10-CM | POA: Diagnosis not present

## 2019-07-17 DIAGNOSIS — C50919 Malignant neoplasm of unspecified site of unspecified female breast: Secondary | ICD-10-CM | POA: Diagnosis not present

## 2019-07-17 DIAGNOSIS — G8929 Other chronic pain: Secondary | ICD-10-CM | POA: Diagnosis not present

## 2019-07-17 DIAGNOSIS — G4700 Insomnia, unspecified: Secondary | ICD-10-CM | POA: Diagnosis not present

## 2019-07-17 DIAGNOSIS — E039 Hypothyroidism, unspecified: Secondary | ICD-10-CM | POA: Diagnosis not present

## 2019-07-17 DIAGNOSIS — E785 Hyperlipidemia, unspecified: Secondary | ICD-10-CM | POA: Diagnosis not present

## 2019-07-17 DIAGNOSIS — M1990 Unspecified osteoarthritis, unspecified site: Secondary | ICD-10-CM | POA: Diagnosis not present

## 2019-07-17 DIAGNOSIS — R69 Illness, unspecified: Secondary | ICD-10-CM | POA: Diagnosis not present

## 2019-07-17 DIAGNOSIS — R32 Unspecified urinary incontinence: Secondary | ICD-10-CM | POA: Diagnosis not present

## 2019-07-17 DIAGNOSIS — N3281 Overactive bladder: Secondary | ICD-10-CM | POA: Diagnosis not present

## 2019-09-02 ENCOUNTER — Telehealth: Payer: Medicare HMO | Admitting: Cardiovascular Disease

## 2019-09-17 ENCOUNTER — Other Ambulatory Visit: Payer: Self-pay | Admitting: Hematology and Oncology

## 2019-09-17 DIAGNOSIS — Z1231 Encounter for screening mammogram for malignant neoplasm of breast: Secondary | ICD-10-CM

## 2019-09-23 DIAGNOSIS — C50912 Malignant neoplasm of unspecified site of left female breast: Secondary | ICD-10-CM | POA: Diagnosis not present

## 2019-09-23 DIAGNOSIS — E039 Hypothyroidism, unspecified: Secondary | ICD-10-CM | POA: Diagnosis not present

## 2019-09-23 DIAGNOSIS — E118 Type 2 diabetes mellitus with unspecified complications: Secondary | ICD-10-CM | POA: Diagnosis not present

## 2019-09-23 DIAGNOSIS — Z79899 Other long term (current) drug therapy: Secondary | ICD-10-CM | POA: Diagnosis not present

## 2019-09-23 DIAGNOSIS — E785 Hyperlipidemia, unspecified: Secondary | ICD-10-CM | POA: Diagnosis not present

## 2019-10-20 DIAGNOSIS — E730 Congenital lactase deficiency: Secondary | ICD-10-CM | POA: Diagnosis not present

## 2019-10-20 DIAGNOSIS — E039 Hypothyroidism, unspecified: Secondary | ICD-10-CM | POA: Diagnosis not present

## 2019-10-20 DIAGNOSIS — G3184 Mild cognitive impairment, so stated: Secondary | ICD-10-CM | POA: Diagnosis not present

## 2019-10-20 DIAGNOSIS — E1169 Type 2 diabetes mellitus with other specified complication: Secondary | ICD-10-CM | POA: Diagnosis not present

## 2019-10-20 DIAGNOSIS — E785 Hyperlipidemia, unspecified: Secondary | ICD-10-CM | POA: Diagnosis not present

## 2019-10-27 DIAGNOSIS — Z23 Encounter for immunization: Secondary | ICD-10-CM | POA: Diagnosis not present

## 2019-10-28 ENCOUNTER — Other Ambulatory Visit: Payer: Self-pay

## 2019-10-28 ENCOUNTER — Ambulatory Visit
Admission: RE | Admit: 2019-10-28 | Discharge: 2019-10-28 | Disposition: A | Discharge status: Home / Self Care | Payer: Medicare HMO | Source: Ambulatory Visit | Attending: Hematology and Oncology | Admitting: Hematology and Oncology

## 2019-10-28 DIAGNOSIS — Z1231 Encounter for screening mammogram for malignant neoplasm of breast: Secondary | ICD-10-CM

## 2019-10-28 DIAGNOSIS — Z853 Personal history of malignant neoplasm of breast: Secondary | ICD-10-CM

## 2019-10-28 DIAGNOSIS — R922 Inconclusive mammogram: Secondary | ICD-10-CM | POA: Diagnosis not present

## 2019-10-28 HISTORY — DX: Malignant neoplasm of unspecified site of unspecified female breast: C50.919

## 2019-10-30 ENCOUNTER — Other Ambulatory Visit: Payer: Self-pay | Admitting: Hematology and Oncology

## 2019-10-30 DIAGNOSIS — Z853 Personal history of malignant neoplasm of breast: Secondary | ICD-10-CM

## 2019-11-23 ENCOUNTER — Encounter (INDEPENDENT_AMBULATORY_CARE_PROVIDER_SITE_OTHER): Payer: Self-pay | Admitting: Gastroenterology

## 2019-12-22 ENCOUNTER — Telehealth: Payer: Self-pay | Admitting: Hematology and Oncology

## 2019-12-22 NOTE — Telephone Encounter (Signed)
L/m to r/s due to provider PAL

## 2019-12-31 ENCOUNTER — Telehealth: Payer: Self-pay | Admitting: Hematology and Oncology

## 2019-12-31 NOTE — Telephone Encounter (Signed)
Rescheduled per provider. Called pt and left a msg

## 2020-01-09 ENCOUNTER — Other Ambulatory Visit: Payer: Self-pay | Admitting: Hematology and Oncology

## 2020-01-12 ENCOUNTER — Ambulatory Visit: Payer: Medicare HMO | Admitting: Urology

## 2020-01-12 NOTE — Progress Notes (Incomplete)
H&P  Chief Complaint: OAB  History of Present Illness: Breanna Hill is a 80 y.o. year old female here for F/U of her OAB.  1.11.2022:   (below copied from [facility] records):  CC: I get up too often at night to urinate.  HPI: Breanna Hill is a 80 year-old female established patient who is here for urinating too frequently at night.  She first noticed the symptom approximately 03/02/2014. She usually gets up at night to urinate 3 times.   Patient is dissatisfied with frequency of urination in the evening.   CC: I have an overactive bladder.  HPI: She is taking Sanctura for her over active bladder. She does urinate more frequently than once every 4 hours in the daytime. She does not urinate more frequently than once every 2 hours in the daytime.   She does wear protective pads. She wears 1-2 pads per day. She gets up at night to urinate 3 times.   3.19.2019: She was previously tried on oxybutynin which was d/ced due to cognitive issues. She had been on Myrbetriq 50 mg with some but not substantial improvement. Trospium was added.   6.25.2019: With the combination of her overactive bladder medications, daytime urinary symptoms are much improved, without significant side effects.   6.30.2020: Patient has discontinued myrbetriq and reports slight worsening of her symptoms but is satisfied with sanctura. Denies any issues with her bowels or with dry mouth. Patient uses pads and only needs to change them once a day, and when changed they are only slightly damp.   Past Medical History:  Diagnosis Date  . Arthritis    R hand   . Breast cancer ([place])   . Breast disorder    cancer  . Cancer [facility])    breast CA- diagnosed with needle biopsy  . Depression   . GERD (gastroesophageal reflux disease)    rare use of tums   . High cholesterol 10/17/2015  . History of radiation therapy 03/27/17- 04/23/17   Left Breast, 2.67 Gy in 15 fractions for a total dose of 40.05 Gy. Boost, 2 Gy in 5 fractions  for a total dose of 10 Gy.   Marland Kitchen Hypothyroidism   . Pre-diabetes    Hgba1C- in the 6 's , per pt., she monitors her diet    Past Surgical History:  Procedure Laterality Date  . [place]  . BREAST LUMPECTOMY Left 02/2017  . BREAST LUMPECTOMY WITH RADIOACTIVE SEED AND SENTINEL LYMPH NODE BIOPSY Left 02/27/2017   Procedure: LEFT BREAST LUMPECTOMY WITH RADIOACTIVE SEED AND LEFT AXILLARY SENTINEL LYMPH NODE BIOPSY;  Surgeon: Donnie Mesa, MD;  Location: [place];  Service: General;  Laterality: Left;  . COLONOSCOPY     X 2  . COLONOSCOPY N/A 02/22/2016   Procedure: COLONOSCOPY;  Surgeon: Rogene Houston, MD;  [facility];  Service: Endoscopy;  Laterality: N/A;  1:45  . EYE SURGERY Bilateral    phlebpheroplasty  . Left shoultder surgery     for bone spurs & frozen shoulder   . Right bunionectomy    . TONSILLECTOMY    . TUBAL LIGATION      Home Medications:  (Not in a hospital admission)   Allergies:  Allergies  Allergen Reactions  . Penicillins Rash    Has patient had a PCN reaction causing immediate rash, facial/tongue/throat swelling, SOB or lightheadedness with hypotension: No Has patient had a PCN reaction causing severe rash involving mucus membranes or skin necrosis: No Has  patient had a PCN reaction that required hospitalization: No Has patient had a PCN reaction occurring within the last 10 years: No If all of the above answers are "NO", then may proceed with Cephalosporin use.     Family History  Problem Relation Age of Onset  . Colon cancer Neg Hx     Social History:  reports that she has never smoked. She has never used smokeless tobacco. She reports current alcohol use. She reports that she does not use drugs.  ROS: A complete review of systems was performed.  All systems are negative except for pertinent findings as noted.  Physical Exam:  Vital signs in last 24 hours: BP: ()/()  Arterial Line BP: ()/()  General:  Alert and oriented, No  acute distress HEENT: Normocephalic, atraumatic Neck: No JVD or lymphadenopathy Cardiovascular: Regular rate  Lungs: Normal inspiratory/expiratory excursion Abdomen: Soft, nontender, nondistended, no abdominal masses Back: No CVA tenderness Extremities: No edema Neurologic: Grossly intact  Laboratory Data:  No results found for this or any previous visit (from the past 24 hour(s)). No results found for this or any previous visit (from the past 240 hour(s)). Creatinine: No results for input(s): CREATININE in the last 168 hours.  Radiologic Imaging: No results found.  Impression/Assessment:  ***  Plan:  ***  Shaun Pitts 01/12/2020, 2:10 PM  Lillette Boxer. Dahlstedt MD

## 2020-02-01 ENCOUNTER — Ambulatory Visit: Payer: Medicare HMO | Admitting: Hematology and Oncology

## 2020-02-08 NOTE — Progress Notes (Incomplete)
Patient Care Team: Asencion Noble, MD as PCP - General (Internal Medicine) Herminio Commons, MD (Inactive) as PCP - Cardiology (Cardiology) Nicholas Lose, MD as Consulting Physician (Hematology and Oncology) [name], MD as Attending Physician (Radiation Oncology) Donnie Mesa, MD as Consulting Physician (General Surgery) Delice Bison Charlestine Massed, NP as Nurse Practitioner (Hematology and Oncology)  DIAGNOSIS: No diagnosis found.  SUMMARY OF ONCOLOGIC HISTORY: Oncology History  Malignant neoplasm of upper-inner quadrant of left breast in female, estrogen receptor positive ([place])  02/27/2017 Surgery   Left lumpectomy 02/27/2017: IDC grade 1, 1.1 cm, ALH, margins negative, 0/2 lymph nodes negative, ER 90%, PR 10%, HER-2 negative ratio 1.26, Ki-67 1%, T1CN0 stage I a    03/27/2017 - 04/30/2017 Radiation Therapy   Adjuvant radiation therapy   04/30/2017 -  Anti-estrogen oral therapy   Letrozole 2.5 mg daily x5 years     CHIEF COMPLIANT: Follow-up of left breast cancer on letrozole therapy  INTERVAL HISTORY: Breanna Hill is a 80 y.o. with above-mentioned history of left breast cancer treated with lumpectomy,radiation, and who is currently on anti-estrogen therapy with letrozole.Mammogram on 11/02/19 showed no evidence of malignancy bilaterally. She presents to the clinictoday for annual follow-up   ALLERGIES:  is allergic to penicillins.  MEDICATIONS:  Current Outpatient Medications  Medication Sig Dispense Refill  . acetaminophen (TYLENOL) 325 MG tablet Take 650 mg by mouth every 6 (six) hours as needed.    Marland Kitchen atorvastatin (LIPITOR) 20 MG tablet Take 20 mg by mouth every evening.     . escitalopram (LEXAPRO) 10 MG tablet Take 5 mg by mouth daily before breakfast.     . letrozole (FEMARA) 2.5 MG tablet TAKE 1 TABLET BY MOUTH EVERY DAY 90 tablet 3  . levothyroxine (SYNTHROID, LEVOTHROID) 100 MCG tablet Take 100 mcg by mouth daily before breakfast.     . Trospium Chloride 60  MG CP24 Take 60 mg by mouth at bedtime.      No current facility-administered medications for this visit.    PHYSICAL EXAMINATION: ECOG PERFORMANCE STATUS: {CHL ONC ECOG PS:[id number]}  There were no vitals filed for this visit. There were no vitals filed for this visit.  BREAST:*** No palpable masses or nodules in either right or left breasts. No palpable axillary supraclavicular or infraclavicular adenopathy no breast tenderness or nipple discharge. (exam performed in the presence of a chaperone)  LABORATORY DATA:  I have reviewed the data as listed CMP Latest Ref Rng & Units 02/06/2019 02/21/2017 03/01/2014  Glucose 70 - 99 mg/dL 148(H) 134(H) 152(H)  BUN 8 - 23 mg/dL 19 18 23   Creatinine 0.44 - 1.00 mg/dL 0.77 0.87 0.80  Sodium 135 - 145 mmol/L 139 138 138  Potassium 3.5 - 5.1 mmol/L 3.7 4.0 3.7  Chloride 98 - 111 mmol/L 102 102 105  CO2 22 - 32 mmol/L 27 26 27   Calcium 8.9 - 10.3 mg/dL 8.7(L) 9.1 9.1  Total Protein 6.0 - 8.3 g/dL - - 6.9  Total Bilirubin 0.3 - 1.2 mg/dL - - 0.3  Alkaline Phos 39 - 117 U/L - - 80  AST 0 - 37 U/L - - 25  ALT 0 - 35 U/L - - 27    Lab Results  Component Value Date   WBC 5.6 02/06/2019   HGB 13.0 02/06/2019   HCT 39.0 02/06/2019   MCV 91.3 02/06/2019   PLT 236 02/06/2019   NEUTROABS 3.9 02/06/2019    ASSESSMENT & PLAN:  No problem-specific Assessment & Plan notes found  for this encounter.    No orders of the defined types were placed in this encounter.  The patient has a good understanding of the overall plan. she agrees with it. she will call with any problems that may develop before the next visit here.  Total time spent: *** mins including face to face time and time spent for planning, charting and coordination of care  Rulon Eisenmenger, MD, MPH 02/08/2020  I, Cloyde Reams Dorshimer, am acting as scribe for Dr. Nicholas Lose.  {insert scribe attestation}

## 2020-02-09 ENCOUNTER — Inpatient Hospital Stay: Payer: Medicare HMO | Admitting: Hematology and Oncology

## 2020-02-10 ENCOUNTER — Inpatient Hospital Stay: Payer: Medicare HMO | Admitting: Hematology and Oncology

## 2020-02-17 NOTE — Assessment & Plan Note (Signed)
Left lumpectomy 02/27/2017: IDC grade 1, 1.1 cm, ALH, margins negative, 0/2 lymph nodes negative, ER 90%, PR 10%, HER-2 negative ratio 1.26, Ki-67 1%, T1CN0 stage I a  Adjuvant radiation therapy 03/27/2017-04/30/2017 Treatment plan: Adjuvant antiestrogen therapy with letrozole 2.5 mg daily x5 yearsstarted 04/30/2017 Recovered from COVID-19 infection  Letrozoletoxicities: 1.Occasional hot flashes 2.Mild joint stiffness especially in the hands  Right knee arthritis: I encouraged her to take turmeric  Breast cancer surveillance: 1.Mammogram 11/02/2019 Benign breast density category B 2.Breast exam 02/18/20: Benign 3.Bone density will need to be performed.  Return to clinic in 1 year for follow-up

## 2020-02-17 NOTE — Progress Notes (Signed)
  HEMATOLOGY-ONCOLOGY TELEPHONE VISIT PROGRESS NOTE  I connected with Breanna Hill on 02/18/2020 at 10:30 AM EST by telephone and verified that I am speaking with the correct person using two identifiers.  I discussed the limitations, risks, security and privacy concerns of performing an evaluation and management service by telephone and the availability of in person appointments.  I also discussed with the patient that there may be a patient responsible charge related to this service. The patient expressed understanding and agreed to proceed.   History of Present Illness: Breanna Hill is a 80 y.o. female with above-mentioned history of left breast cancer treated with lumpectomy,radiation, and who is currently on anti-estrogen therapy with letrozole.Mammogram on 11/02/19 showed no evidence of malignancy bilaterally. She presents over the phone today for annual follow-up.  Oncology History  Malignant neoplasm of upper-inner quadrant of left breast in female, estrogen receptor positive ([place])  02/27/2017 Surgery   Left lumpectomy 02/27/2017: IDC grade 1, 1.1 cm, ALH, margins negative, 0/2 lymph nodes negative, ER 90%, PR 10%, HER-2 negative ratio 1.26, Ki-67 1%, T1CN0 stage I a    03/27/2017 - 04/30/2017 Radiation Therapy   Adjuvant radiation therapy   04/30/2017 -  Anti-estrogen oral therapy   Letrozole 2.5 mg daily x5 years     Observations/Objective:     Assessment Plan:  Malignant neoplasm of upper-inner quadrant of left breast in female, estrogen receptor positive ([place]) Left lumpectomy 02/27/2017: IDC grade 1, 1.1 cm, ALH, margins negative, 0/2 lymph nodes negative, ER 90%, PR 10%, HER-2 negative ratio 1.26, Ki-67 1%, T1CN0 stage I a  Adjuvant radiation therapy 03/27/2017-04/30/2017  Treatment plan: Adjuvant antiestrogen therapy with letrozole 2.5 mg daily x5 yearsstarted 04/30/2017  Letrozoletoxicities: Mild hot flashes  Husband died April 16, 2019. Shes grieving from his  loss  Right knee arthritis: still present.  Breast cancer surveillance: 1.Mammogram 11/02/2019 Benign breast density category B 2.Bone density will need to be performed. I sent an order  Return to clinic in 1 year for follow-up     I discussed the assessment and treatment plan with the patient. The patient was provided an opportunity to ask questions and all were answered. The patient agreed with the plan and demonstrated an understanding of the instructions. The patient was advised to call back or seek an in-person evaluation if the symptoms worsen or if the condition fails to improve as anticipated.   Total time spent: 20 mins including non-face to face time and time spent for planning, charting and coordination of care  Rulon Eisenmenger, MD 02/18/2020    I, Cloyde Reams Dorshimer, am acting as scribe for Nicholas Lose, MD.  I have reviewed the above documentation for accuracy and completeness, and I agree with the above.

## 2020-02-18 ENCOUNTER — Ambulatory Visit (INDEPENDENT_AMBULATORY_CARE_PROVIDER_SITE_OTHER): Payer: Medicare HMO | Admitting: Gastroenterology

## 2020-02-18 ENCOUNTER — Inpatient Hospital Stay: Payer: Medicare HMO | Attending: Hematology and Oncology | Admitting: Hematology and Oncology

## 2020-02-18 DIAGNOSIS — C50212 Malignant neoplasm of upper-inner quadrant of left female breast: Secondary | ICD-10-CM

## 2020-02-18 DIAGNOSIS — Z170 Estrogen receptor positive status [ER+]: Secondary | ICD-10-CM | POA: Diagnosis not present

## 2020-03-01 ENCOUNTER — Other Ambulatory Visit: Payer: Self-pay

## 2020-03-01 ENCOUNTER — Encounter: Payer: Self-pay | Admitting: Urology

## 2020-03-01 ENCOUNTER — Ambulatory Visit (INDEPENDENT_AMBULATORY_CARE_PROVIDER_SITE_OTHER): Payer: Medicare HMO | Admitting: Urology

## 2020-03-01 VITALS — BP 164/79 | HR 73 | Temp 99.6°F | Ht 63.5 in | Wt 158.0 lb

## 2020-03-01 DIAGNOSIS — R350 Frequency of micturition: Secondary | ICD-10-CM | POA: Diagnosis not present

## 2020-03-01 DIAGNOSIS — R3915 Urgency of urination: Secondary | ICD-10-CM | POA: Diagnosis not present

## 2020-03-01 LAB — URINALYSIS, ROUTINE W REFLEX MICROSCOPIC
Bilirubin, UA: NEGATIVE
Glucose, UA: NEGATIVE
Ketones, UA: NEGATIVE
Leukocytes,UA: NEGATIVE
Nitrite, UA: NEGATIVE
Protein,UA: NEGATIVE
RBC, UA: NEGATIVE
Specific Gravity, UA: 1.02 (ref 1.005–1.030)
Urobilinogen, Ur: 0.2 mg/dL (ref 0.2–1.0)
pH, UA: 7.5 (ref 5.0–7.5)

## 2020-03-01 NOTE — Progress Notes (Signed)
History of Present Illness: Breanna Hill is a 80 y.o. year old female who was last seen here in June, 2020.  She has been followed for overactive bladder-wet.  At that time, she had been tried on oxybutynin which caused some cognitive issues.  She was then placed on Myrbetriq which did not significantly help her symptoms.  She has been on trospium 60 mg daily since that time.  She also had pelvic floor therapy.  She comes in today still complaining of nocturia x3-4, frequency, urgency, urgency incontinence.  She wears a diaper-she only goes through 1 a day.  It is usually just damp.  She has a good stream.  She is not sure that she empties well.  She denies any recent urinary tract infections.  She denies any constipation.  She states that some days she has to void every 15 to 20 minutes.  Past Medical History:  Diagnosis Date  . Arthritis    R hand   . Breast cancer ([place])   . Breast disorder    cancer  . Cancer [facility])    breast CA- diagnosed with needle biopsy  . Depression   . GERD (gastroesophageal reflux disease)    rare use of tums   . High cholesterol 10/17/2015  . History of radiation therapy 03/27/17- 04/23/17   Left Breast, 2.67 Gy in 15 fractions for a total dose of 40.05 Gy. Boost, 2 Gy in 5 fractions for a total dose of 10 Gy.   Marland Kitchen Hypothyroidism   . Pre-diabetes    Hgba1C- in the 6 's , per pt., she monitors her diet    Past Surgical History:  Procedure Laterality Date  . [place]  . BREAST LUMPECTOMY Left 02/2017  . BREAST LUMPECTOMY WITH RADIOACTIVE SEED AND SENTINEL LYMPH NODE BIOPSY Left 02/27/2017   Procedure: LEFT BREAST LUMPECTOMY WITH RADIOACTIVE SEED AND LEFT AXILLARY SENTINEL LYMPH NODE BIOPSY;  Surgeon: Donnie Mesa, MD;  Location: [place];  Service: General;  Laterality: Left;  . COLONOSCOPY     X 2  . COLONOSCOPY N/A 02/22/2016   Procedure: COLONOSCOPY;  Surgeon: Rogene Houston, MD;  [facility];  Service: Endoscopy;  Laterality: N/A;   1:45  . EYE SURGERY Bilateral    phlebpheroplasty  . Left shoultder surgery     for bone spurs & frozen shoulder   . Right bunionectomy    . TONSILLECTOMY    . TUBAL LIGATION      Home Medications:  (Not in a hospital admission)   Allergies:  Allergies  Allergen Reactions  . Penicillins Rash    Has patient had a PCN reaction causing immediate rash, facial/tongue/throat swelling, SOB or lightheadedness with hypotension: No Has patient had a PCN reaction causing severe rash involving mucus membranes or skin necrosis: No Has patient had a PCN reaction that required hospitalization: No Has patient had a PCN reaction occurring within the last 10 years: No If all of the above answers are "NO", then may proceed with Cephalosporin use.     Family History  Problem Relation Age of Onset  . Colon cancer Neg Hx     Social History:  reports that she has never smoked. She has never used smokeless tobacco. She reports current alcohol use. She reports that she does not use drugs.  ROS: A complete review of systems was performed.  All systems are negative except for pertinent findings as noted.  Physical Exam:  Vital signs in last 24  hours: @VSRANGES @ General:  Alert and oriented, No acute distress HEENT: Normocephalic, atraumatic Neck: No JVD or lymphadenopathy Cardiovascular: Regular rate  Lungs: Normal inspiratory/expiratory excursion Back: No CVA tenderness Extremities: No edema Neurologic: Grossly intact  I have reviewed prior pt notes  I have reviewed notes from referring/previous physicians  I have reviewed urinalysis results  I have independently reviewed patient's residual urine volume-20 mL    Impression/Assessment:  Overactive bladder-wet.  Despite being on trospium maximum dose  Plan:  1.  For now, I will have her continue the trospium and I put her on Gemtesa  2.  I will set her up to have PTNS started here  3.  I will have her come back in 6 weeks for  recheck  Jorja Loa 03/01/2020, 11:59 AM  Lillette Boxer. Dahlstedt MD

## 2020-03-01 NOTE — Progress Notes (Signed)

## 2020-03-02 ENCOUNTER — Telehealth: Payer: Self-pay

## 2020-03-02 NOTE — Telephone Encounter (Signed)
Pt was given 7 boxes of Gemtesa samples per Dr. Ruel Favors order yesterday.

## 2020-04-11 ENCOUNTER — Other Ambulatory Visit: Payer: Self-pay

## 2020-04-11 DIAGNOSIS — R350 Frequency of micturition: Secondary | ICD-10-CM

## 2020-04-11 MED ORDER — TROSPIUM CHLORIDE ER 60 MG PO CP24: 60.0000 mg | ORAL_CAPSULE | Freq: Every day | ORAL | 0 refills | Status: DC

## 2020-04-11 NOTE — Progress Notes (Signed)
Patient called stating that she would run out of medication before her appointment. Rx sent in for enough to cover patient until up coming appointment.

## 2020-04-13 DIAGNOSIS — E118 Type 2 diabetes mellitus with unspecified complications: Secondary | ICD-10-CM | POA: Diagnosis not present

## 2020-04-13 DIAGNOSIS — G3184 Mild cognitive impairment, so stated: Secondary | ICD-10-CM | POA: Diagnosis not present

## 2020-04-13 DIAGNOSIS — E785 Hyperlipidemia, unspecified: Secondary | ICD-10-CM | POA: Diagnosis not present

## 2020-04-13 DIAGNOSIS — Z79899 Other long term (current) drug therapy: Secondary | ICD-10-CM | POA: Diagnosis not present

## 2020-04-18 DIAGNOSIS — M1990 Unspecified osteoarthritis, unspecified site: Secondary | ICD-10-CM | POA: Diagnosis not present

## 2020-04-18 DIAGNOSIS — Z853 Personal history of malignant neoplasm of breast: Secondary | ICD-10-CM | POA: Diagnosis not present

## 2020-04-18 DIAGNOSIS — R7309 Other abnormal glucose: Secondary | ICD-10-CM | POA: Diagnosis not present

## 2020-04-18 DIAGNOSIS — E1169 Type 2 diabetes mellitus with other specified complication: Secondary | ICD-10-CM | POA: Diagnosis not present

## 2020-04-19 ENCOUNTER — Telehealth: Payer: Self-pay

## 2020-04-19 NOTE — Telephone Encounter (Signed)
Pt. Called saying she has an appointment soon but will be a few days short of samples of Gemtesa Dr. Diona Fanti had given. Pt was given 2 boxes of Gemtesa samples.

## 2020-04-25 DIAGNOSIS — E559 Vitamin D deficiency, unspecified: Secondary | ICD-10-CM | POA: Diagnosis not present

## 2020-04-25 DIAGNOSIS — M545 Low back pain, unspecified: Secondary | ICD-10-CM | POA: Diagnosis not present

## 2020-04-25 DIAGNOSIS — M5137 Other intervertebral disc degeneration, lumbosacral region: Secondary | ICD-10-CM | POA: Diagnosis not present

## 2020-04-25 DIAGNOSIS — M170 Bilateral primary osteoarthritis of knee: Secondary | ICD-10-CM | POA: Diagnosis not present

## 2020-04-25 DIAGNOSIS — M810 Age-related osteoporosis without current pathological fracture: Secondary | ICD-10-CM | POA: Diagnosis not present

## 2020-04-25 DIAGNOSIS — S22080A Wedge compression fracture of T11-T12 vertebra, initial encounter for closed fracture: Secondary | ICD-10-CM | POA: Diagnosis not present

## 2020-04-25 NOTE — Progress Notes (Signed)
History of Preseess: Breanna Hill is a 80 y.o. year old female presents for followup of OAB--resistant to Rx so far. At her last visit she was sent home on combination of Trospium and Gemtesa.   With this combo she has done much better. No real issues w/ constipation, no dry mouth.  Past Medical History:  Diagnosis Date  . Arthritis    R hand   . Breast cancer ([place])   . Breast disorder    cancer  . Cancer [facility])    breast CA- diagnosed with needle biopsy  . Depression   . GERD (gastroesophageal reflux disease)    rare use of tums   . High cholesterol 10/17/2015  . History of radiation therapy 03/27/17- 04/23/17   Left Breast, 2.67 Gy in 15 fractions for a total dose of 40.05 Gy. Boost, 2 Gy in 5 fractions for a total dose of 10 Gy.   Marland Kitchen Hypothyroidism   . Pre-diabetes    Hgba1C- in the 6 's , per pt., she monitors her diet    Past Surgical History:  Procedure Laterality Date  . [place]  . BREAST LUMPECTOMY Left 02/2017  . BREAST LUMPECTOMY WITH RADIOACTIVE SEED AND SENTINEL LYMPH NODE BIOPSY Left 02/27/2017   Procedure: LEFT BREAST LUMPECTOMY WITH RADIOACTIVE SEED AND LEFT AXILLARY SENTINEL LYMPH NODE BIOPSY;  Surgeon: Donnie Mesa, MD;  Location: [place];  Service: General;  Laterality: Left;  . COLONOSCOPY     X 2  . COLONOSCOPY N/A 02/22/2016   Procedure: COLONOSCOPY;  Surgeon: Rogene Houston, MD;  [facility];  Service: Endoscopy;  Laterality: N/A;  1:45  . EYE SURGERY Bilateral    phlebpheroplasty  . Left shoultder surgery     for bone spurs & frozen shoulder   . Right bunionectomy    . TONSILLECTOMY    . TUBAL LIGATION      Home Medications:  (Not in a hospital admission)   Allergies:  Allergies  Allergen Reactions  . Penicillins Rash    Has patient had a PCN reaction causing immediate rash, facial/tongue/throat swelling, SOB or lightheadedness with hypotension: No Has patient had a PCN reaction causing severe rash involving mucus  membranes or skin necrosis: No Has patient had a PCN reaction that required hospitalization: No Has patient had a PCN reaction occurring within the last 10 years: No If all of the above answers are "NO", then may proceed with Cephalosporin use.     Family History  Problem Relation Age of Onset  . Colon cancer Neg Hx     Social History:  reports that she has never smoked. She has never used smokeless tobacco. She reports current alcohol use. She reports that she does not use drugs.  ROS: A complete review of systems was performed.  All systems are negative except for pertinent findings as noted.  Physical Exam:  Vital signs in last 24 hours: @VSRANGES @ General:  Alert and oriented, No acute distress HEENT: Normocephalic, atraumatic Neck: No JVD or lymphadenopathy Cardiovascular: Regular rate Extremities: No edema Neurologic: Grossly intact  I have reviewed prior pt notes  I have reviewed urinalysis results    Impression/Assessment:  Overactive bladder-.  Her symptoms have improved significantly with a combination of trospium and Gemtesa.  Unfortunately, she does not tolerate antimuscarinics well at all, and Myrbetriq did not work significantly.  Plan:  I will give her samples as well as a prescription for the Gemtesa  I will also have her  try to wean off the trospium, and at least take it every other day  I will see back in 6 months for recheck  Jorja Loa 04/25/2020, 7:32 PM  Lillette Boxer. Ova Meegan MD

## 2020-04-26 ENCOUNTER — Encounter: Payer: Self-pay | Admitting: Urology

## 2020-04-26 ENCOUNTER — Ambulatory Visit (INDEPENDENT_AMBULATORY_CARE_PROVIDER_SITE_OTHER): Payer: Medicare HMO | Admitting: Urology

## 2020-04-26 ENCOUNTER — Other Ambulatory Visit: Payer: Self-pay

## 2020-04-26 VITALS — BP 146/82 | HR 102 | Temp 98.7°F

## 2020-04-26 DIAGNOSIS — R3915 Urgency of urination: Secondary | ICD-10-CM | POA: Diagnosis not present

## 2020-04-26 DIAGNOSIS — R350 Frequency of micturition: Secondary | ICD-10-CM

## 2020-04-26 LAB — URINALYSIS, ROUTINE W REFLEX MICROSCOPIC
Bilirubin, UA: NEGATIVE
Ketones, UA: NEGATIVE
Leukocytes,UA: NEGATIVE
Nitrite, UA: NEGATIVE
Protein,UA: NEGATIVE
RBC, UA: NEGATIVE
Specific Gravity, UA: 1.025 (ref 1.005–1.030)
Urobilinogen, Ur: 0.2 mg/dL (ref 0.2–1.0)
pH, UA: 6 (ref 5.0–7.5)

## 2020-04-26 MED ORDER — GEMTESA 75 MG PO TABS: 1.0000 | ORAL_TABLET | Freq: Every day | ORAL | 3 refills | Status: DC

## 2020-04-26 NOTE — Progress Notes (Signed)

## 2020-04-27 DIAGNOSIS — M85851 Other specified disorders of bone density and structure, right thigh: Secondary | ICD-10-CM | POA: Diagnosis not present

## 2020-04-27 DIAGNOSIS — M85852 Other specified disorders of bone density and structure, left thigh: Secondary | ICD-10-CM | POA: Diagnosis not present

## 2020-05-10 ENCOUNTER — Other Ambulatory Visit: Payer: Self-pay

## 2020-05-10 DIAGNOSIS — R350 Frequency of micturition: Secondary | ICD-10-CM

## 2020-05-10 MED ORDER — TROSPIUM CHLORIDE ER 60 MG PO CP24: 60.0000 mg | ORAL_CAPSULE | ORAL | 5 refills | Status: DC

## 2020-05-16 ENCOUNTER — Telehealth: Payer: Self-pay

## 2020-05-16 DIAGNOSIS — M170 Bilateral primary osteoarthritis of knee: Secondary | ICD-10-CM | POA: Diagnosis not present

## 2020-05-16 DIAGNOSIS — M810 Age-related osteoporosis without current pathological fracture: Secondary | ICD-10-CM | POA: Diagnosis not present

## 2020-05-16 NOTE — Progress Notes (Signed)
History of Present Illness: Breanna Hill is a 80 y.o. year old female here because of recent pelvic pressure, worsened frequency and urgency.  She is on both trospium and Gemtesa.  Her symptoms lasted for 3 days.  She did take AZO over the weekend which seemed to help.  She is not currently having any symptoms.  Past Medical History:  Diagnosis Date  . Arthritis    R hand   . Breast cancer ([place])   . Breast disorder    cancer  . Cancer [facility])    breast CA- diagnosed with needle biopsy  . Depression   . GERD (gastroesophageal reflux disease)    rare use of tums   . High cholesterol 10/17/2015  . History of radiation therapy 03/27/17- 04/23/17   Left Breast, 2.67 Gy in 15 fractions for a total dose of 40.05 Gy. Boost, 2 Gy in 5 fractions for a total dose of 10 Gy.   Marland Kitchen Hypothyroidism   . Pre-diabetes    Hgba1C- in the 6 's , per pt., she monitors her diet    Past Surgical History:  Procedure Laterality Date  . [place]  . BREAST LUMPECTOMY Left 02/2017  . BREAST LUMPECTOMY WITH RADIOACTIVE SEED AND SENTINEL LYMPH NODE BIOPSY Left 02/27/2017   Procedure: LEFT BREAST LUMPECTOMY WITH RADIOACTIVE SEED AND LEFT AXILLARY SENTINEL LYMPH NODE BIOPSY;  Surgeon: Donnie Mesa, MD;  Location: [place];  Service: General;  Laterality: Left;  . COLONOSCOPY     X 2  . COLONOSCOPY N/A 02/22/2016   Procedure: COLONOSCOPY;  Surgeon: Rogene Houston, MD;  [facility];  Service: Endoscopy;  Laterality: N/A;  1:45  . EYE SURGERY Bilateral    phlebpheroplasty  . Left shoultder surgery     for bone spurs & frozen shoulder   . Right bunionectomy    . TONSILLECTOMY    . TUBAL LIGATION      Home Medications:  (Not in a hospital admission)   Allergies:  Allergies  Allergen Reactions  . Penicillins Rash    Has patient had a PCN reaction causing immediate rash, facial/tongue/throat swelling, SOB or lightheadedness with hypotension: No Has patient had a PCN reaction causing severe  rash involving mucus membranes or skin necrosis: No Has patient had a PCN reaction that required hospitalization: No Has patient had a PCN reaction occurring within the last 10 years: No If all of the above answers are "NO", then may proceed with Cephalosporin use.     Family History  Problem Relation Age of Onset  . Colon cancer Neg Hx     Social History:  reports that she has never smoked. She has never used smokeless tobacco. She reports current alcohol use. She reports that she does not use drugs.  ROS: A complete review of systems was performed.  All systems are negative except for pertinent findings as noted.  Physical Exam:  Vital signs in last 24 hours: @VSRANGES @ General:  Alert and oriented, No acute distress HEENT: Normocephalic, atraumatic Neck: No JVD or lymphadenopathy Cardiovascular: Regular rate  Lungs: Normal inspiratory/expiratory excursion Extremities: No edema Neurologic: Grossly intact  I have reviewed prior pt notes  I have reviewed urinalysis results--urinalysis clear    Impression/Assessment:  Recent pelvic pressure, probably unrelated to infection  Overactive bladder, on dual medical therapy and doing fairly well on this  Plan:  She will continue trospium and Gemtesa  I will have her come back at her scheduled appointment  Annie Main  M Teddie Mehta 05/16/2020, 7:55 PM  Lillette Boxer. Tena Linebaugh MD

## 2020-05-17 ENCOUNTER — Ambulatory Visit (INDEPENDENT_AMBULATORY_CARE_PROVIDER_SITE_OTHER): Payer: Medicare HMO | Admitting: Urology

## 2020-05-17 ENCOUNTER — Encounter: Payer: Self-pay | Admitting: Urology

## 2020-05-17 ENCOUNTER — Other Ambulatory Visit: Payer: Self-pay

## 2020-05-17 VITALS — BP 143/63 | HR 68 | Wt 157.0 lb

## 2020-05-17 DIAGNOSIS — R3915 Urgency of urination: Secondary | ICD-10-CM

## 2020-05-17 DIAGNOSIS — R350 Frequency of micturition: Secondary | ICD-10-CM

## 2020-05-17 LAB — URINALYSIS, ROUTINE W REFLEX MICROSCOPIC
Bilirubin, UA: NEGATIVE
Glucose, UA: NEGATIVE
Ketones, UA: NEGATIVE
Leukocytes,UA: NEGATIVE
Nitrite, UA: NEGATIVE
Protein,UA: NEGATIVE
RBC, UA: NEGATIVE
Specific Gravity, UA: 1.02 (ref 1.005–1.030)
Urobilinogen, Ur: 0.2 mg/dL (ref 0.2–1.0)
pH, UA: 6 (ref 5.0–7.5)

## 2020-05-17 NOTE — Progress Notes (Signed)
Urological Symptom Review  Patient is experiencing the following symptoms: Frequent urination Hard to postpone urination Burning/pain with urination Get up at night to urinate Urinary tract infection   Review of Systems  Gastrointestinal (upper)  : Negative for upper GI symptoms  Gastrointestinal (lower) : Diarrhea  Constitutional : Negative for symptoms  Skin: Negative for skin symptoms  Eyes: Negative for eye symptoms  Ear/Nose/Throat : Negative for Ear/Nose/Throat symptoms  Hematologic/Lymphatic: Negative for Hematologic/Lymphatic symptoms  Cardiovascular : Negative for cardiovascular symptoms  Respiratory : Negative for respiratory symptoms  Endocrine: Negative for endocrine symptoms  Musculoskeletal: Negative for musculoskeletal symptoms  Neurological: Negative for neurological symptoms  Psychologic: Negative for psychiatric symptoms

## 2020-05-30 DIAGNOSIS — C50919 Malignant neoplasm of unspecified site of unspecified female breast: Secondary | ICD-10-CM | POA: Diagnosis not present

## 2020-05-30 DIAGNOSIS — M810 Age-related osteoporosis without current pathological fracture: Secondary | ICD-10-CM | POA: Diagnosis not present

## 2020-05-30 DIAGNOSIS — E785 Hyperlipidemia, unspecified: Secondary | ICD-10-CM | POA: Diagnosis not present

## 2020-05-30 DIAGNOSIS — K5900 Constipation, unspecified: Secondary | ICD-10-CM | POA: Diagnosis not present

## 2020-05-30 DIAGNOSIS — R030 Elevated blood-pressure reading, without diagnosis of hypertension: Secondary | ICD-10-CM | POA: Diagnosis not present

## 2020-05-30 DIAGNOSIS — E039 Hypothyroidism, unspecified: Secondary | ICD-10-CM | POA: Diagnosis not present

## 2020-05-30 DIAGNOSIS — R69 Illness, unspecified: Secondary | ICD-10-CM | POA: Diagnosis not present

## 2020-05-30 DIAGNOSIS — M1990 Unspecified osteoarthritis, unspecified site: Secondary | ICD-10-CM | POA: Diagnosis not present

## 2020-05-30 DIAGNOSIS — E663 Overweight: Secondary | ICD-10-CM | POA: Diagnosis not present

## 2020-05-30 DIAGNOSIS — G8929 Other chronic pain: Secondary | ICD-10-CM | POA: Diagnosis not present

## 2020-05-31 DIAGNOSIS — E1169 Type 2 diabetes mellitus with other specified complication: Secondary | ICD-10-CM | POA: Diagnosis not present

## 2020-05-31 DIAGNOSIS — E785 Hyperlipidemia, unspecified: Secondary | ICD-10-CM | POA: Diagnosis not present

## 2020-05-31 DIAGNOSIS — E039 Hypothyroidism, unspecified: Secondary | ICD-10-CM | POA: Diagnosis not present

## 2020-06-02 DIAGNOSIS — Z0100 Encounter for examination of eyes and vision without abnormal findings: Secondary | ICD-10-CM | POA: Diagnosis not present

## 2020-06-07 ENCOUNTER — Ambulatory Visit: Payer: Medicare HMO | Admitting: Urology

## 2020-06-09 ENCOUNTER — Other Ambulatory Visit: Payer: Self-pay | Admitting: Urology

## 2020-06-09 ENCOUNTER — Telehealth: Payer: Self-pay | Admitting: Urology

## 2020-06-09 DIAGNOSIS — R350 Frequency of micturition: Secondary | ICD-10-CM

## 2020-06-09 MED ORDER — GEMTESA 75 MG PO TABS: 1.0000 | ORAL_TABLET | Freq: Every day | ORAL | 3 refills | Status: DC

## 2020-06-09 NOTE — Telephone Encounter (Signed)
Patient called and requested a refill of her [place] and I left a message to send a prescription but did not verify which pharmacy. I called but was unable to leave a message. Waiting on a call back.

## 2020-06-09 NOTE — Telephone Encounter (Signed)
I called patient and she voices that she wanted the [facility] sent to [facility]. No other concerns.

## 2020-06-13 DIAGNOSIS — M810 Age-related osteoporosis without current pathological fracture: Secondary | ICD-10-CM | POA: Diagnosis not present

## 2020-06-13 DIAGNOSIS — E559 Vitamin D deficiency, unspecified: Secondary | ICD-10-CM | POA: Diagnosis not present

## 2020-06-20 ENCOUNTER — Telehealth: Payer: Self-pay

## 2020-06-20 NOTE — Telephone Encounter (Signed)
Patient called office today stating she would need a tier exception created for her Gemtesa samples.  Tier exception started on CoverMyMeds.

## 2020-06-24 ENCOUNTER — Telehealth: Payer: Self-pay

## 2020-06-24 NOTE — Telephone Encounter (Signed)
Pre-cert started for [facility]- form submitted to [employer]

## 2020-06-24 NOTE — Telephone Encounter (Signed)
-----   Message from Franchot Gallo, MD sent at 06/23/2020 11:45 AM EDT ----- Can you see about preauth on Gemtesa for this lady? Got rf request

## 2020-06-29 ENCOUNTER — Telehealth: Payer: Self-pay

## 2020-06-29 DIAGNOSIS — R350 Frequency of micturition: Secondary | ICD-10-CM

## 2020-06-29 MED ORDER — TROSPIUM CHLORIDE ER 60 MG PO CP24: 60.0000 mg | ORAL_CAPSULE | Freq: Every day | ORAL | 11 refills | Status: DC

## 2020-06-29 NOTE — Telephone Encounter (Signed)
Patient called stating insurance denied [place] Patient asking to go back on trospium daily.  Discussed with Dr. Diona Fanti. Will send in new prescription for daily trospium.  Patient called- no answer. Left message of prescription changed.

## 2020-07-11 DIAGNOSIS — Z170 Estrogen receptor positive status [ER+]: Secondary | ICD-10-CM | POA: Diagnosis not present

## 2020-07-11 DIAGNOSIS — C50912 Malignant neoplasm of unspecified site of left female breast: Secondary | ICD-10-CM | POA: Diagnosis not present

## 2020-07-27 ENCOUNTER — Other Ambulatory Visit: Payer: Medicare HMO

## 2020-07-29 DIAGNOSIS — M9905 Segmental and somatic dysfunction of pelvic region: Secondary | ICD-10-CM | POA: Diagnosis not present

## 2020-07-29 DIAGNOSIS — M9902 Segmental and somatic dysfunction of thoracic region: Secondary | ICD-10-CM | POA: Diagnosis not present

## 2020-07-29 DIAGNOSIS — M62830 Muscle spasm of back: Secondary | ICD-10-CM | POA: Diagnosis not present

## 2020-07-29 DIAGNOSIS — M546 Pain in thoracic spine: Secondary | ICD-10-CM | POA: Diagnosis not present

## 2020-07-29 DIAGNOSIS — M9903 Segmental and somatic dysfunction of lumbar region: Secondary | ICD-10-CM | POA: Diagnosis not present

## 2020-08-01 DIAGNOSIS — M546 Pain in thoracic spine: Secondary | ICD-10-CM | POA: Diagnosis not present

## 2020-08-01 DIAGNOSIS — M9902 Segmental and somatic dysfunction of thoracic region: Secondary | ICD-10-CM | POA: Diagnosis not present

## 2020-08-01 DIAGNOSIS — M62830 Muscle spasm of back: Secondary | ICD-10-CM | POA: Diagnosis not present

## 2020-08-01 DIAGNOSIS — M9903 Segmental and somatic dysfunction of lumbar region: Secondary | ICD-10-CM | POA: Diagnosis not present

## 2020-08-01 DIAGNOSIS — M9905 Segmental and somatic dysfunction of pelvic region: Secondary | ICD-10-CM | POA: Diagnosis not present

## 2020-08-11 DIAGNOSIS — Z79899 Other long term (current) drug therapy: Secondary | ICD-10-CM | POA: Diagnosis not present

## 2020-08-11 DIAGNOSIS — M1990 Unspecified osteoarthritis, unspecified site: Secondary | ICD-10-CM | POA: Diagnosis not present

## 2020-08-11 DIAGNOSIS — C50919 Malignant neoplasm of unspecified site of unspecified female breast: Secondary | ICD-10-CM | POA: Diagnosis not present

## 2020-08-11 DIAGNOSIS — R030 Elevated blood-pressure reading, without diagnosis of hypertension: Secondary | ICD-10-CM | POA: Diagnosis not present

## 2020-08-11 DIAGNOSIS — E118 Type 2 diabetes mellitus with unspecified complications: Secondary | ICD-10-CM | POA: Diagnosis not present

## 2020-08-11 DIAGNOSIS — E039 Hypothyroidism, unspecified: Secondary | ICD-10-CM | POA: Diagnosis not present

## 2020-08-18 DIAGNOSIS — E785 Hyperlipidemia, unspecified: Secondary | ICD-10-CM | POA: Diagnosis not present

## 2020-08-18 DIAGNOSIS — E1100 Type 2 diabetes mellitus with hyperosmolarity without nonketotic hyperglycemic-hyperosmolar coma (NKHHC): Secondary | ICD-10-CM | POA: Diagnosis not present

## 2020-08-18 DIAGNOSIS — E039 Hypothyroidism, unspecified: Secondary | ICD-10-CM | POA: Diagnosis not present

## 2020-08-18 DIAGNOSIS — I10 Essential (primary) hypertension: Secondary | ICD-10-CM | POA: Diagnosis not present

## 2020-09-11 ENCOUNTER — Other Ambulatory Visit: Payer: Self-pay

## 2020-09-11 ENCOUNTER — Encounter (HOSPITAL_COMMUNITY): Payer: Self-pay | Admitting: *Deleted

## 2020-09-11 ENCOUNTER — Emergency Department (HOSPITAL_COMMUNITY)
Admission: EM | Admit: 2020-09-11 | Discharge: 2020-09-11 | Disposition: A | Discharge status: Home / Self Care | Payer: Medicare HMO | Attending: Emergency Medicine | Admitting: Emergency Medicine

## 2020-09-11 ENCOUNTER — Emergency Department (HOSPITAL_COMMUNITY): Payer: Medicare HMO

## 2020-09-11 DIAGNOSIS — E039 Hypothyroidism, unspecified: Secondary | ICD-10-CM | POA: Insufficient documentation

## 2020-09-11 DIAGNOSIS — W010XXA Fall on same level from slipping, tripping and stumbling without subsequent striking against object, initial encounter: Secondary | ICD-10-CM | POA: Insufficient documentation

## 2020-09-11 DIAGNOSIS — S4992XA Unspecified injury of left shoulder and upper arm, initial encounter: Secondary | ICD-10-CM | POA: Diagnosis present

## 2020-09-11 DIAGNOSIS — Z9012 Acquired absence of left breast and nipple: Secondary | ICD-10-CM | POA: Insufficient documentation

## 2020-09-11 DIAGNOSIS — M11232 Other chondrocalcinosis, left wrist: Secondary | ICD-10-CM | POA: Diagnosis not present

## 2020-09-11 DIAGNOSIS — R9431 Abnormal electrocardiogram [ECG] [EKG]: Secondary | ICD-10-CM | POA: Diagnosis not present

## 2020-09-11 DIAGNOSIS — Z853 Personal history of malignant neoplasm of breast: Secondary | ICD-10-CM | POA: Insufficient documentation

## 2020-09-11 DIAGNOSIS — S42292A Other displaced fracture of upper end of left humerus, initial encounter for closed fracture: Secondary | ICD-10-CM | POA: Insufficient documentation

## 2020-09-11 DIAGNOSIS — S42202A Unspecified fracture of upper end of left humerus, initial encounter for closed fracture: Secondary | ICD-10-CM

## 2020-09-11 DIAGNOSIS — S42212A Unspecified displaced fracture of surgical neck of left humerus, initial encounter for closed fracture: Secondary | ICD-10-CM | POA: Diagnosis not present

## 2020-09-11 DIAGNOSIS — Z043 Encounter for examination and observation following other accident: Secondary | ICD-10-CM | POA: Diagnosis not present

## 2020-09-11 DIAGNOSIS — Z79899 Other long term (current) drug therapy: Secondary | ICD-10-CM | POA: Insufficient documentation

## 2020-09-11 DIAGNOSIS — R0789 Other chest pain: Secondary | ICD-10-CM | POA: Insufficient documentation

## 2020-09-11 MED ORDER — ONDANSETRON 4 MG PO TBDP
4.0000 mg | ORAL_TABLET | Freq: Once | ORAL | Status: AC
  Administered 2020-09-11: 4 mg via ORAL
  Filled 2020-09-11: qty 1

## 2020-09-11 MED ORDER — OXYCODONE-ACETAMINOPHEN 5-325 MG PO TABS
1.0000 | ORAL_TABLET | Freq: Once | ORAL | Status: AC
  Administered 2020-09-11: 1 via ORAL
  Filled 2020-09-11: qty 1

## 2020-09-11 MED ORDER — OXYCODONE-ACETAMINOPHEN 5-325 MG PO TABS: 1.0000 | ORAL_TABLET | Freq: Three times a day (TID) | ORAL | 0 refills | Status: AC | PRN

## 2020-09-11 MED ORDER — HYDROMORPHONE HCL 1 MG/ML IJ SOLN
0.5000 mg | Freq: Once | INTRAMUSCULAR | Status: AC
  Administered 2020-09-11: 0.5 mg via INTRAMUSCULAR
  Filled 2020-09-11: qty 1

## 2020-09-11 MED ORDER — OXYCODONE-ACETAMINOPHEN 5-325 MG PO TABS: 1.0000 | ORAL_TABLET | Freq: Three times a day (TID) | ORAL | 0 refills | Status: DC | PRN

## 2020-09-11 NOTE — ED Notes (Signed)
Patient transported to X-ray 

## 2020-09-11 NOTE — ED Provider Notes (Signed)
[facility] EMERGENCY DEPARTMENT Provider Note   CSN: [id number] Arrival date & time: 09/11/20  1254     History Chief Complaint  Patient presents with   Fall    Left shoulder pain    Breanna Hill is a 80 y.o. female.  HPI  Patient with significant medical history of left breast cancer with lumpectomy currently on hormone therapy, depression, prediabetes presents to the emergency department with chief complaint of a fall.  Patient states today while she was in her room she was cleaning and unfortunately she tripped over the broom causing her to fall onto her left side.  She denies hitting her head, losing conscious, is not on anticoagulants.  Patient states she landed directly onto her left arm, states she has pain from her left elbow up into her left shoulder, states that she was able to get herself up off the floor and walk around and when she tried to move her left arm she heard a pop and is concerned that she might of broken something or dislocated it.  She has occasional paresthesias in the distal part of her left arm, but is able to move her fingers and wrist without difficulty, has pain when she bends her elbow or moves her shoulder.  She denies headaches, neck pain, back pain, chest pain, abdominal pain, pain in her lower extremities.  She has no other complaints at this time.  States that she took  some ibuprofen without much relief.  Past Medical History:  Diagnosis Date   Arthritis    R hand    Breast cancer ([place])    Breast disorder    cancer   Cancer ([place])    breast CA- diagnosed with needle biopsy   Depression    GERD (gastroesophageal reflux disease)    rare use of tums    High cholesterol 10/17/2015   History of radiation therapy 03/27/17- 04/23/17   Left Breast, 2.67 Gy in 15 fractions for a total dose of 40.05 Gy. Boost, 2 Gy in 5 fractions for a total dose of 10 Gy.    Hypothyroidism    Pre-diabetes    Hgba1C- in the 6 's , per pt., she monitors her diet     Patient Active Problem List   Diagnosis Date Noted   Malignant neoplasm of upper-inner quadrant of left breast in female, estrogen receptor positive ([place]) 03/05/2017   History of colonic polyps 10/31/2015   High cholesterol 10/17/2015   SHOULDER, ARTHRITIS, DEGEN./OSTEO 02/28/2009   CONTRACTURE OF SHOULDER JOINT 02/16/2009   SHOULDER PAIN 02/16/2009    Past Surgical History:  Procedure Laterality Date   BACK SURGERY  1973   BREAST LUMPECTOMY Left 02/2017   BREAST LUMPECTOMY WITH RADIOACTIVE SEED AND SENTINEL LYMPH NODE BIOPSY Left 02/27/2017   Procedure: LEFT BREAST LUMPECTOMY WITH RADIOACTIVE SEED AND LEFT AXILLARY SENTINEL LYMPH NODE BIOPSY;  Surgeon: Donnie Mesa, MD;  Location: [place];  Service: General;  Laterality: Left;   COLONOSCOPY     X 2   COLONOSCOPY N/A 02/22/2016   Procedure: COLONOSCOPY;  Surgeon: Rogene Houston, MD;  [facility];  Service: Endoscopy;  Laterality: N/A;  1:45   EYE SURGERY Bilateral    phlebpheroplasty   Left shoultder surgery     for bone spurs & frozen shoulder    Right bunionectomy     TONSILLECTOMY     TUBAL LIGATION       OB History     Gravida  2  Para  2   Term  2   Preterm      AB      Living  2      SAB      IAB      Ectopic      Multiple      Live Births              Family History  Problem Relation Age of Onset   Colon cancer Neg Hx     Social History   Tobacco Use   Smoking status: Never   Smokeless tobacco: Never  Vaping Use   Vaping Use: Never used  Substance Use Topics   Alcohol use: Yes    Comment: occasional   Drug use: No    Home Medications Prior to Admission medications   Medication Sig Start Date End Date Taking? Authorizing Provider  oxyCODONE-acetaminophen (PERCOCET/ROXICET) 5-325 MG tablet Take 1 tablet by mouth every 8 (eight) hours as needed for up to 2 days for severe pain. 09/11/20 09/13/20 Yes Marcello Fennel, PA-C  oxyCODONE-acetaminophen  (PERCOCET/ROXICET) 5-325 MG tablet Take 1 tablet by mouth every 8 (eight) hours as needed for up to 3 days. 09/11/20 09/14/20 Yes Marcello Fennel, PA-C  acetaminophen (TYLENOL) 325 MG tablet Take 650 mg by mouth every 6 (six) hours as needed.    [provider]  atorvastatin (LIPITOR) 20 MG tablet Take 20 mg by mouth every evening.  01/22/14   [provider]  escitalopram (LEXAPRO) 10 MG tablet Take 5 mg by mouth daily before breakfast.    [provider]  letrozole ([place]) 2.5 MG tablet TAKE 1 TABLET BY MOUTH EVERY DAY 01/11/20   Nicholas Lose, MD  levothyroxine (SYNTHROID, LEVOTHROID) 100 MCG tablet Take 100 mcg by mouth daily before breakfast.  01/22/14   [provider]  Trospium Chloride 60 MG CP24 Take 1 capsule (60 mg total) by mouth daily. 06/29/20   Franchot Gallo, MD    Allergies    Penicillins  Review of Systems   Review of Systems  Constitutional:  Negative for chills and fever.  HENT:  Negative for congestion.   Eyes:  Negative for visual disturbance.  Respiratory:  Negative for shortness of breath.   Cardiovascular:  Negative for chest pain.  Gastrointestinal:  Negative for abdominal pain.  Genitourinary:  Negative for enuresis.  Musculoskeletal:  Negative for back pain and neck pain.       Left arm pain.  Skin:  Negative for rash.  Neurological:  Negative for dizziness and headaches.  Hematological:  Does not bruise/bleed easily.   Physical Exam Updated Vital Signs BP (!) 143/75 (BP Location: Right Arm)   Pulse (!) 57   Temp 98.6 F (37 C)   Resp 15   Ht '5\' 2"'$  (1.575 m)   Wt 72.6 kg   SpO2 97%   BMI 29.26 kg/m   Physical Exam Vitals and nursing note reviewed.  Constitutional:      General: She is not in acute distress.    Appearance: She is not ill-appearing.  HENT:     Head: Normocephalic and atraumatic.     Comments: No deformities of the head present, no raccoon eyes or battle sign present, head was nontender to  palpation.    Nose: No congestion.  Eyes:     Extraocular Movements: Extraocular movements intact.     Conjunctiva/sclera: Conjunctivae normal.  Cardiovascular:     Rate and Rhythm: Normal rate and regular rhythm.  Pulses: Normal pulses.     Heart sounds: No murmur heard.   No friction rub. No gallop.  Pulmonary:     Effort: No respiratory distress.     Breath sounds: No wheezing, rhonchi or rales.     Comments: Chest was palpated patient is notably tender on the anterior aspect of the left third and fourth rib no crepitus or deformities present.  Clavicles were nontender to palpation. Chest:     Chest wall: Tenderness present.  Abdominal:     Palpations: Abdomen is soft.     Tenderness: There is no abdominal tenderness. There is no right CVA tenderness or left CVA tenderness.  Musculoskeletal:     Comments: Spine was palpated it was nontender to palpation, no step-off or deformities present.  Patient has full range of motion, 5 of 5 strength, neurovascular intact in the right upper as well as the lower extremities.  Patient able to move her fingers without difficulty, she was tender to palpation on the distal end of her radius and ulna, her olecranon process, as well as her shoulder, there is no deformities or crepitus present.    Skin:    General: Skin is warm and dry.  Neurological:     Mental Status: She is alert.     Comments: No facial asymmetry, no difficulty with word finding, able to follow two-step commands, no slurring of his words, no unilateral weakness present.  Psychiatric:        Mood and Affect: Mood normal.    ED Results / Procedures / Treatments   Labs (all labs ordered are listed, but only abnormal results are displayed) Labs Reviewed - No data to display  EKG None  Radiology DG Ribs Unilateral W/Chest Left  Result Date: 09/11/2020 CLINICAL DATA:  Tenderness along the anterior aspects of the 3rd and 4th ribs following a fall today. Left shoulder  pain. EXAM: LEFT RIBS AND CHEST - 3+ VIEW COMPARISON:  Chest dated 02/06/2019 FINDINGS: Impacted fracture of the left humeral neck and head. No rib fracture or pneumothorax seen. Stable borderline enlarged cardiac silhouette and mildly prominent interstitial markings. No pneumothorax or pleural fluid. IMPRESSION: 1. Impacted left humeral head and neck fracture. 2. No rib fracture or pneumothorax seen. 3. Stable borderline cardiomegaly and mild chronic interstitial lung disease. Electronically Signed   By: Claudie Revering M.D.   On: 09/11/2020 15:08   DG Wrist Complete Left  Result Date: 09/11/2020 CLINICAL DATA:  [place] today. EXAM: LEFT WRIST - COMPLETE 3+ VIEW COMPARISON:  None. FINDINGS: Diffuse osteopenia. Mild triangular fibrocartilage calcification. Mild 1st metacarpal/carpal degenerative spur formation. No fracture or dislocation. IMPRESSION: 1. No fracture. 2. First metacarpal/carpal degenerative changes. 3. Mild chondrocalcinosis. Electronically Signed   By: Claudie Revering M.D.   On: 09/11/2020 15:11   CT Shoulder Left Wo Contrast  Result Date: 09/11/2020 CLINICAL DATA:  Left proximal humerus fracture after fall. EXAM: CT OF THE UPPER LEFT EXTREMITY WITHOUT CONTRAST TECHNIQUE: Multidetector CT imaging of the upper left extremity was performed according to the standard protocol. COMPARISON:  Left shoulder x-rays from same day. FINDINGS: Bones/Joint/Cartilage Acute comminuted fracture of the humeral head and neck with involvement of the greater and lesser tuberosities. Up to 1.9 cm impaction and 9 mm anterior displacement at the surgical neck. 3 mm articular surface depression of the posterosuperior humeral head. No dislocation. Mild acromioclavicular osteoarthritis. Small glenohumeral joint effusion. Ligaments Ligaments are suboptimally evaluated by CT. Muscles and Tendons Grossly intact. Soft tissue No fluid collection or  hematoma.  No soft tissue mass. IMPRESSION: 1. Acute comminuted mildly displaced and  impacted fracture of the humeral head and neck as described above. Electronically Signed   By: Titus Dubin M.D.   On: 09/11/2020 17:05   DG Shoulder Left  Result Date: 09/11/2020 CLINICAL DATA:  Left shoulder pain following a fall today. EXAM: LEFT SHOULDER - 2+ VIEW COMPARISON:  None. FINDINGS: Impacted, comminuted humeral head and neck fracture. Anterior displacement of the distal fragment with overlapping of the fragments. No significant angulation. IMPRESSION: Impacted, comminuted left humeral head and neck fracture with anterior displacement of the distal fragment. Electronically Signed   By: Claudie Revering M.D.   On: 09/11/2020 15:10    Procedures Procedures   Medications Ordered in ED Medications  oxyCODONE-acetaminophen (PERCOCET/ROXICET) 5-325 MG per tablet 1 tablet (1 tablet Oral Given 09/11/20 1348)  HYDROmorphone (DILAUDID) injection 0.5 mg (0.5 mg Intramuscular Given 09/11/20 1452)  ondansetron (ZOFRAN-ODT) disintegrating tablet 4 mg (4 mg Oral Given 09/11/20 1451)    ED Course  I have reviewed the triage vital signs and the nursing notes.  Pertinent labs & imaging results that were available during my care of the patient were reviewed by me and considered in my medical decision making (see chart for details).    MDM Rules/Calculators/A&P                          Initial impression-patient presents after a fall.  She is alert, does appear to be in acute distress, vital signs reassuring.  I have concern for orthopedic injury of the left upper extremity, will obtain imaging, provide patient with pain medications, and reassess.  Work-up-DG of left ribs shows no acute fracture or pneumothorax present, DG of left shoulder reveals impacted left humeral head and neck fracture with anterior displacement of the distal fragment.  DG of wrist negative for acute findings.  CT shoulder reveals acute mildly displaced and impacted fracture of the humeral head and neck as scribed above.  There  is no noted dislocation of the humeral head.  Reassessment-imaging significant for humeral head and neck fracture possible displacement will speak with orthopedic surgery for further recommendations.  Patient was updated on imaging and recommendation from orthopedic surgery she is agreement this plan, she does endorse that she still has a 7 out of 10 pain, will provide her with a 0.5 of Dilaudid and continue to monitor.  Notify that O2 sats slightly decreased we will start her on supplemental oxygen O2 sats have now remained in the mid 90s  Patient reassessed, vital signs remained stable, she has no complaints this time, patient is agreeable for discharge   Consult-spoke with Dr. Lucia Gaskins of orthopedic surgery he recommends further evaluation with a CT shoulder for further evaluation if not displaced at the head patient be placed in a sling and follow-up with his office.  If there is displacement he would like a additional consult.  Rule out- low suspicion for intracranial head bleed as patient denies loss of conscious, is not on anticoagulant, she does not endorse headaches, paresthesia/weakness in the upper and lower extremities, no focal deficits present on my exam.  Will defer head CT at this time.  Low suspicion for spinal cord abnormality or spinal fracture spine was palpated was nontender to palpation, patient has full range of motion in the upper and lower extremities.  Low suspicion for pneumothorax as lung sounds are clear bilaterally, x-ray is negative for acute findings.  Low suspicion for intra-abdominal trauma as abdomen soft nontender to palpation.  Low suspicion for compartment syndrome at the distal aspect of the left upper extremity as compartments are soft nontender, neurovascular fully intact.  Low suspicion for dislocation as CT imaging negative for acute findings.  Plan-  Left shoulder pain-likely due to the displacing compacted fracture of the humeral head and neck  will place in  a sling, however follow-up with orthopedic surgery for further evaluation.  will also blab of pain medications.  Vital signs have remained stable, no indication for hospital admission.  Patient discussed with attending and they agreed with assessment and plan.  Patient given at home care as well strict return precautions.  Patient verbalized that they understood agreed to said plan.  Final Clinical Impression(s) / ED Diagnoses Final diagnoses:  Closed fracture of proximal end of left humerus, unspecified fracture morphology, initial encounter  Closed fracture of head of left humerus, initial encounter    Rx / DC Orders ED Discharge Orders          Ordered    oxyCODONE-acetaminophen (PERCOCET/ROXICET) 5-325 MG tablet  Every 8 hours PRN        09/11/20 1732    oxyCODONE-acetaminophen (PERCOCET/ROXICET) 5-325 MG tablet  Every 8 hours PRN        09/11/20 1733             Marcello Fennel, PA-C 09/11/20 1735    Noemi Chapel, MD 09/12/20 [phone]

## 2020-09-11 NOTE — Discharge Instructions (Addendum)
You have a humeral head and neck fracture  placed you in an sling please wear at all times.  I recommend applying ice to the area as this can decrease inflammation and swelling. I have given you a short course of narcotics please take as prescribed.  This medication can make you drowsy do not consume alcohol or operate heavy machinery when taking this medication.  This medication is Tylenol in it do not take Tylenol and take this medication.   Please follow-up with orthopedic surgery for further evaluation  Come back to the emergency department if you develop chest pain, shortness of breath, severe abdominal pain, uncontrolled nausea, vomiting, diarrhea.

## 2020-09-11 NOTE — ED Provider Notes (Signed)
Patient presents after having an accidental fall when she tripped over her broom, landed on the ground, she is unsure exactly how she hit the ground but has a slight skin tear to the right elbow and her bilateral knees but heard increasing pain in the left shoulder heard a pop, pain is 8 out of 10.  On my exam she is tender to the touch, there is a fullness around the left shoulder but no obvious deformity, she is unable to move the shoulder secondary to pain but is able to fully range her hand wrist and elbow on that same left side.  No lower extremity deficits tenderness or obvious abnormalities or deformities.  Right upper extremity without any significant tenderness whatsoever.  Able to take deep breaths without any significant pain though she does have some left upper chest tenderness to palpation.  No signs of head injury.  Imaging, pain control, immobilized with sling, possible proximal humerus fracture  Medical screening examination/treatment/procedure(s) were conducted as a shared visit with non-physician practitioner(s) and myself.  I personally evaluated the patient during the encounter.  Clinical Impression:   Final diagnoses:  None         Noemi Chapel, MD 09/16/20 [phone]

## 2020-09-11 NOTE — ED Triage Notes (Signed)
Tripped over broom and fell onto left shoulder; pt states she heard a pop; pain rating at least an 8; very limited movement; pt denies hitting head

## 2020-09-11 NOTE — ED Notes (Signed)
Patient transported to CT 

## 2020-09-12 MED FILL — Oxycodone w/ Acetaminophen Tab 5-325 MG: ORAL | Qty: 6 | Status: AC

## 2020-09-13 ENCOUNTER — Encounter (HOSPITAL_COMMUNITY): Payer: Self-pay | Admitting: Orthopaedic Surgery

## 2020-09-13 ENCOUNTER — Other Ambulatory Visit: Payer: Self-pay

## 2020-09-13 DIAGNOSIS — S42355A Nondisplaced comminuted fracture of shaft of humerus, left arm, initial encounter for closed fracture: Secondary | ICD-10-CM | POA: Diagnosis not present

## 2020-09-13 NOTE — H&P (Signed)
PREOPERATIVE H&P  Chief Complaint: left proximal humerus fracture  HPI: Breanna Hill is a 80 y.o. female who is scheduled for Procedure(s): REVERSE SHOULDER ARTHROPLASTY.   Patient has a past medical history significant for breast cancer, GERD, HTN, hypothyroidism.   Patient had a fall on 09/11/2020 when she landed on her left side. She felt immediate pain. She was seen in [facility] Emergency Department. Work-up in the ED was significant for proximal humerus fracture. She was sent to Orthopedics for evaluation.   Her symptoms are rated as moderate to severe, and have been worsening.  This is significantly impairing activities of daily living.    Please see clinic note for further details on this patient's care.    She has elected for surgical management.   Past Medical History:  Diagnosis Date   Arthritis    R hand    Breast cancer ([place])    Breast disorder    cancer   Cancer ([place])    breast CA- diagnosed with needle biopsy   Depression    GERD (gastroesophageal reflux disease)    rare use of tums    High cholesterol 10/17/2015   History of radiation therapy 03/27/17- 04/23/17   Left Breast, 2.67 Gy in 15 fractions for a total dose of 40.05 Gy. Boost, 2 Gy in 5 fractions for a total dose of 10 Gy.    Hypertension    Hypothyroidism    Pre-diabetes    Hgba1C- in the 6 's , per pt., she monitors her diet   Past Surgical History:  Procedure Laterality Date   BACK SURGERY  1973   BREAST LUMPECTOMY Left 02/2017   BREAST LUMPECTOMY WITH RADIOACTIVE SEED AND SENTINEL LYMPH NODE BIOPSY Left 02/27/2017   Procedure: LEFT BREAST LUMPECTOMY WITH RADIOACTIVE SEED AND LEFT AXILLARY SENTINEL LYMPH NODE BIOPSY;  Surgeon: Donnie Mesa, MD;  Location: [place];  Service: General;  Laterality: Left;   COLONOSCOPY     X 2   COLONOSCOPY N/A 02/22/2016   Procedure: COLONOSCOPY;  Surgeon: Rogene Houston, MD;  [facility];  Service: Endoscopy;  Laterality: N/A;  1:45   EYE  SURGERY Bilateral    phlebpheroplasty   Left shoultder surgery     for bone spurs & frozen shoulder    Right bunionectomy     TONSILLECTOMY     TUBAL LIGATION     Social History   Socioeconomic History   Marital status: Married    Spouse name: Not on file   Number of children: Not on file   Years of education: Not on file   Highest education level: Not on file  Occupational History   Not on file  Tobacco Use   Smoking status: Never   Smokeless tobacco: Never  Vaping Use   Vaping Use: Never used  Substance and Sexual Activity   Alcohol use: Yes    Comment: occasional   Drug use: No   Sexual activity: Not Currently    Birth control/protection: Post-menopausal  Other Topics Concern   Not on file  Social History Narrative   Not on file   Social Determinants of Health   Financial Resource Strain: Not on file  Food Insecurity: Not on file  Transportation Needs: Not on file  Physical Activity: Not on file  Stress: Not on file  Social Connections: Not on file   Family History  Problem Relation Age of Onset   Colon cancer Neg Hx    Allergies  Allergen Reactions   Penicillins Rash    Has patient had a PCN reaction causing immediate rash, facial/tongue/throat swelling, SOB or lightheadedness with hypotension: No Has patient had a PCN reaction causing severe rash involving mucus membranes or skin necrosis: No Has patient had a PCN reaction that required hospitalization: No Has patient had a PCN reaction occurring within the last 10 years: No If all of the above answers are "NO", then may proceed with Cephalosporin use.    Prior to Admission medications   Medication Sig Start Date End Date Taking? Authorizing Provider  lisinopril (ZESTRIL) 10 MG tablet Take 10 mg by mouth daily.   Yes [provider]  Vibegron (GEMTESA) 75 MG TABS Take by mouth daily. Takes in the evening   Yes [provider]  acetaminophen (TYLENOL) 325 MG tablet Take 650 mg by  mouth every 6 (six) hours as needed.    [provider]  atorvastatin (LIPITOR) 20 MG tablet Take 20 mg by mouth every evening.  01/22/14   [provider]  escitalopram (LEXAPRO) 10 MG tablet Take 5 mg by mouth daily before breakfast.    [provider]  letrozole ([place]) 2.5 MG tablet TAKE 1 TABLET BY MOUTH EVERY DAY 01/11/20   Nicholas Lose, MD  levothyroxine (SYNTHROID, LEVOTHROID) 100 MCG tablet Take 100 mcg by mouth daily before breakfast.  01/22/14   [provider]  oxyCODONE-acetaminophen (PERCOCET/ROXICET) 5-325 MG tablet Take 1 tablet by mouth every 8 (eight) hours as needed for up to 2 days for severe pain. 09/11/20 09/13/20  Marcello Fennel, PA-C  oxyCODONE-acetaminophen (PERCOCET/ROXICET) 5-325 MG tablet Take 1 tablet by mouth every 8 (eight) hours as needed for up to 3 days. 09/11/20 09/14/20  Marcello Fennel, PA-C  Trospium Chloride 60 MG CP24 Take 1 capsule (60 mg total) by mouth daily. 06/29/20   Franchot Gallo, MD    ROS: All other systems have been reviewed and were otherwise negative with the exception of those mentioned in the HPI and as above.  Physical Exam: General: Alert, no acute distress Cardiovascular: No pedal edema Respiratory: No cyanosis, no use of accessory musculature GI: No organomegaly, abdomen is soft and non-tender Skin: No lesions in the area of chief complaint Neurologic: Sensation intact distally Psychiatric: Patient is competent for consent with normal mood and affect Lymphatic: No axillary or cervical lymphadenopathy  MUSCULOSKELETAL:  Left shoulder: swelling of left shoulder. Tender to palpation proximal humerus. ROM not tested in setting of known fracture. Distal motor and sensory function intact.   Imaging: CT scan on left shoulder demonstrates displaced umeral head and neck fracture  Assessment: left proximal humerus fracture  Plan: Plan for Procedure(s): REVERSE SHOULDER ARTHROPLASTY  The  risks benefits and alternatives were discussed with the patient including but not limited to the risks of nonoperative treatment, versus surgical intervention including infection, bleeding, nerve injury,  blood clots, cardiopulmonary complications, morbidity, mortality, among others, and they were willing to proceed.   We additionally specifically discussed risks of axillary nerve injury, infection, periprosthetic fracture, continued pain and longevity of implants prior to beginning procedure.    Patient will be closely monitored in PACU for medical stabilization and pain control. If found stable in PACU, patient may be discharged home with outpatient follow-up. If any concerns regarding patient's stabilization patient will be admitted for observation after surgery. The patient is planning to be discharged home with outpatient PT.   The patient acknowledged the explanation, agreed to proceed with the plan and  consent was signed.   Operative Plan: Left reverse total shoulder arthroplasty for fracture Discharge Medications: Standard DVT Prophylaxis: Aspirin Physical Therapy: Outpatient PT - delayed fashion Special Discharge needs: Sling. [place], PA-C  09/13/2020 12:43 PM

## 2020-09-13 NOTE — Progress Notes (Signed)
Please enter orders for surgery scheduled for 09-14-20

## 2020-09-13 NOTE — Progress Notes (Signed)
COVID swab appointment: N/A  COVID Vaccine Completed:  Yes x3 Date COVID Vaccine completed: Has received booster: Yes x1 COVID vaccine manufacturer: [place]   Date of COVID positive in last 90 days:  No  PCP - Asencion Noble, MD Cardiologist - Kate Sable, MD  Chest x-ray - N/A EKG - 09-11-20 Epic Stress Test - 03-02-19 Epic ECHO - 03-18-19 Epic Cardiac Cath - N/A Pacemaker/ICD device last checked: Spinal Cord Stimulator:  Sleep Study - N/A CPAP -   Fasting Blood Sugar - N/A Checks Blood Sugar _____ times a day  Blood Thinner Instructions:  N/A Aspirin Instructions: Last Dose:  Activity level:  Can go up a flight of stairs and perform activities of daily living without stopping and without symptoms of chest pain or shortness of breath.  Patient lives alone.    Anesthesia review:  Hx of chest pain evaluated by cardiology.   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  Patient denies shortness of breath, fever, cough and chest pain at PAT appointment (completed over the phone)   Patient verbalized understanding of instructions that were given to them at the PAT appointment. Patient was also instructed that they will need to review over the PAT instructions again at home before surgery.

## 2020-09-14 ENCOUNTER — Ambulatory Visit (HOSPITAL_COMMUNITY): Payer: Medicare HMO | Admitting: Physician Assistant

## 2020-09-14 ENCOUNTER — Ambulatory Visit (HOSPITAL_COMMUNITY): Payer: Medicare HMO

## 2020-09-14 ENCOUNTER — Ambulatory Visit (HOSPITAL_COMMUNITY)
Admission: RE | Admit: 2020-09-14 | Discharge: 2020-09-14 | Disposition: A | Discharge status: Home / Self Care | Payer: Medicare HMO | Attending: Orthopaedic Surgery | Admitting: Orthopaedic Surgery

## 2020-09-14 ENCOUNTER — Encounter (HOSPITAL_COMMUNITY): Payer: Self-pay | Admitting: Orthopaedic Surgery

## 2020-09-14 ENCOUNTER — Encounter (HOSPITAL_COMMUNITY)
Admission: RE | Disposition: A | Discharge status: Home / Self Care | Payer: Self-pay | Source: Home / Self Care | Attending: Orthopaedic Surgery

## 2020-09-14 DIAGNOSIS — Z79890 Hormone replacement therapy (postmenopausal): Secondary | ICD-10-CM | POA: Diagnosis not present

## 2020-09-14 DIAGNOSIS — Z79811 Long term (current) use of aromatase inhibitors: Secondary | ICD-10-CM | POA: Insufficient documentation

## 2020-09-14 DIAGNOSIS — G8918 Other acute postprocedural pain: Secondary | ICD-10-CM | POA: Diagnosis not present

## 2020-09-14 DIAGNOSIS — I10 Essential (primary) hypertension: Secondary | ICD-10-CM | POA: Diagnosis not present

## 2020-09-14 DIAGNOSIS — Z923 Personal history of irradiation: Secondary | ICD-10-CM | POA: Diagnosis not present

## 2020-09-14 DIAGNOSIS — M19012 Primary osteoarthritis, left shoulder: Secondary | ICD-10-CM | POA: Insufficient documentation

## 2020-09-14 DIAGNOSIS — C50912 Malignant neoplasm of unspecified site of left female breast: Secondary | ICD-10-CM | POA: Insufficient documentation

## 2020-09-14 DIAGNOSIS — E039 Hypothyroidism, unspecified: Secondary | ICD-10-CM | POA: Insufficient documentation

## 2020-09-14 DIAGNOSIS — Z9889 Other specified postprocedural states: Secondary | ICD-10-CM | POA: Diagnosis not present

## 2020-09-14 DIAGNOSIS — Z09 Encounter for follow-up examination after completed treatment for conditions other than malignant neoplasm: Secondary | ICD-10-CM

## 2020-09-14 DIAGNOSIS — W19XXXA Unspecified fall, initial encounter: Secondary | ICD-10-CM | POA: Insufficient documentation

## 2020-09-14 DIAGNOSIS — K219 Gastro-esophageal reflux disease without esophagitis: Secondary | ICD-10-CM | POA: Insufficient documentation

## 2020-09-14 DIAGNOSIS — Z471 Aftercare following joint replacement surgery: Secondary | ICD-10-CM | POA: Diagnosis not present

## 2020-09-14 DIAGNOSIS — Z96642 Presence of left artificial hip joint: Secondary | ICD-10-CM | POA: Diagnosis not present

## 2020-09-14 DIAGNOSIS — Z79899 Other long term (current) drug therapy: Secondary | ICD-10-CM | POA: Insufficient documentation

## 2020-09-14 DIAGNOSIS — S42202A Unspecified fracture of upper end of left humerus, initial encounter for closed fracture: Secondary | ICD-10-CM | POA: Diagnosis not present

## 2020-09-14 DIAGNOSIS — Z880 Allergy status to penicillin: Secondary | ICD-10-CM | POA: Insufficient documentation

## 2020-09-14 DIAGNOSIS — E7800 Pure hypercholesterolemia, unspecified: Secondary | ICD-10-CM | POA: Diagnosis not present

## 2020-09-14 HISTORY — DX: Essential (primary) hypertension: I10

## 2020-09-14 HISTORY — PX: REVERSE SHOULDER ARTHROPLASTY: SHX5054

## 2020-09-14 LAB — SURGICAL PCR SCREEN
MRSA, PCR: NEGATIVE
Staphylococcus aureus: NEGATIVE

## 2020-09-14 LAB — BASIC METABOLIC PANEL
Anion gap: 10 (ref 5–15)
BUN: 17 mg/dL (ref 8–23)
CO2: 30 mmol/L (ref 22–32)
Calcium: 11.5 mg/dL — ABNORMAL HIGH (ref 8.9–10.3)
Chloride: 98 mmol/L (ref 98–111)
Creatinine, Ser: 0.88 mg/dL (ref 0.44–1.00)
GFR, Estimated: >60 mL/min (ref >60)
Glucose, Bld: 152 mg/dL — ABNORMAL HIGH (ref 70–99)
Potassium: 3.8 mmol/L (ref 3.5–5.1)
Sodium: 138 mmol/L (ref 135–145)

## 2020-09-14 LAB — CBC
HCT: 39.1 % (ref 36.0–46.0)
Hemoglobin: 13.1 g/dL (ref 12.0–15.0)
MCH: 30.6 pg (ref 26.0–34.0)
MCHC: 33.5 g/dL (ref 30.0–36.0)
MCV: 91.4 fL (ref 80.0–100.0)
Platelets: 238 K/uL (ref 150–400)
RBC: 4.28 MIL/uL (ref 3.87–5.11)
RDW: 13 % (ref 11.5–15.5)
WBC: 9.6 K/uL (ref 4.0–10.5)
nRBC: 0 % (ref 0.0–0.2)

## 2020-09-14 LAB — HEMOGLOBIN A1C
Hgb A1c MFr Bld: 6.1 % — ABNORMAL HIGH (ref 4.8–5.6)
Mean Plasma Glucose: 128.37 mg/dL

## 2020-09-14 LAB — GLUCOSE, CAPILLARY: Glucose-Capillary: 168 mg/dL — ABNORMAL HIGH (ref 70–99)

## 2020-09-14 SURGERY — ARTHROPLASTY, SHOULDER, TOTAL, REVERSE
Anesthesia: General | Site: Shoulder | Laterality: Left

## 2020-09-14 MED ORDER — PHENYLEPHRINE 40 MCG/ML (10ML) SYRINGE FOR IV PUSH (FOR BLOOD PRESSURE SUPPORT)
PREFILLED_SYRINGE | INTRAVENOUS | Status: DC | PRN
  Administered 2020-09-14 (×2): 120 ug via INTRAVENOUS

## 2020-09-14 MED ORDER — ONDANSETRON HCL 4 MG/2ML IJ SOLN
INTRAMUSCULAR | Status: DC | PRN
  Administered 2020-09-14: 4 mg via INTRAVENOUS

## 2020-09-14 MED ORDER — ONDANSETRON HCL 4 MG PO TABS: 4.0000 mg | ORAL_TABLET | Freq: Three times a day (TID) | ORAL | 0 refills | Status: AC | PRN

## 2020-09-14 MED ORDER — CELECOXIB 100 MG PO CAPS: 100.0000 mg | ORAL_CAPSULE | Freq: Two times a day (BID) | ORAL | 0 refills | Status: AC

## 2020-09-14 MED ORDER — PHENYLEPHRINE HCL (PRESSORS) 10 MG/ML IV SOLN
INTRAVENOUS | Status: AC
  Filled 2020-09-14: qty 2

## 2020-09-14 MED ORDER — TRANEXAMIC ACID-NACL 1000-0.7 MG/100ML-% IV SOLN
1000.0000 mg | INTRAVENOUS | Status: AC
  Administered 2020-09-14: 1000 mg via INTRAVENOUS
  Filled 2020-09-14: qty 100

## 2020-09-14 MED ORDER — LIDOCAINE 2% (20 MG/ML) 5 ML SYRINGE
INTRAMUSCULAR | Status: DC | PRN
  Administered 2020-09-14: 20 mg via INTRAVENOUS

## 2020-09-14 MED ORDER — CHLORHEXIDINE GLUCONATE 0.12 % MT SOLN
15.0000 mL | Freq: Once | OROMUCOSAL | Status: AC
  Administered 2020-09-14: 15 mL via OROMUCOSAL

## 2020-09-14 MED ORDER — BUPIVACAINE-EPINEPHRINE (PF) 0.25% -1:200000 IJ SOLN
INTRAMUSCULAR | Status: DC | PRN
  Administered 2020-09-14: 15 mL via PERINEURAL

## 2020-09-14 MED ORDER — LACTATED RINGERS IV SOLN: INTRAVENOUS | Status: DC

## 2020-09-14 MED ORDER — VANCOMYCIN HCL 1000 MG IV SOLR
INTRAVENOUS | Status: AC
  Filled 2020-09-14: qty 20

## 2020-09-14 MED ORDER — ACETAMINOPHEN 500 MG PO TABS
1000.0000 mg | ORAL_TABLET | Freq: Once | ORAL | Status: AC
  Administered 2020-09-14: 1000 mg via ORAL
  Filled 2020-09-14: qty 2

## 2020-09-14 MED ORDER — OXYCODONE HCL 5 MG PO TABS: ORAL_TABLET | ORAL | 0 refills | Status: AC

## 2020-09-14 MED ORDER — MIDAZOLAM HCL 2 MG/2ML IJ SOLN
1.0000 mg | Freq: Once | INTRAMUSCULAR | Status: DC
  Filled 2020-09-14: qty 2

## 2020-09-14 MED ORDER — LACTATED RINGERS IV BOLUS
250.0000 mL | Freq: Once | INTRAVENOUS | Status: AC
  Administered 2020-09-14: 250 mL via INTRAVENOUS

## 2020-09-14 MED ORDER — STERILE WATER FOR IRRIGATION IR SOLN
Status: DC | PRN
  Administered 2020-09-14: 1000 mL

## 2020-09-14 MED ORDER — ORAL CARE MOUTH RINSE: 15.0000 mL | Freq: Once | OROMUCOSAL | Status: AC

## 2020-09-14 MED ORDER — DEXAMETHASONE SODIUM PHOSPHATE 10 MG/ML IJ SOLN
INTRAMUSCULAR | Status: AC
  Filled 2020-09-14: qty 1

## 2020-09-14 MED ORDER — 0.9 % SODIUM CHLORIDE (POUR BTL) OPTIME
TOPICAL | Status: DC | PRN
  Administered 2020-09-14: 1000 mL

## 2020-09-14 MED ORDER — FENTANYL CITRATE PF 50 MCG/ML IJ SOSY
50.0000 ug | PREFILLED_SYRINGE | Freq: Once | INTRAMUSCULAR | Status: AC
  Administered 2020-09-14: 50 ug via INTRAVENOUS
  Filled 2020-09-14: qty 2

## 2020-09-14 MED ORDER — PHENYLEPHRINE HCL-NACL 20-0.9 MG/250ML-% IV SOLN
INTRAVENOUS | Status: DC | PRN
Start: 2020-09-14 — End: 2020-09-14
  Administered 2020-09-14: 25 ug/min via INTRAVENOUS

## 2020-09-14 MED ORDER — FENTANYL CITRATE (PF) 100 MCG/2ML IJ SOLN
INTRAMUSCULAR | Status: AC
  Filled 2020-09-14: qty 2

## 2020-09-14 MED ORDER — LIDOCAINE 2% (20 MG/ML) 5 ML SYRINGE
INTRAMUSCULAR | Status: AC
  Filled 2020-09-14: qty 5

## 2020-09-14 MED ORDER — CEFAZOLIN SODIUM-DEXTROSE 2-4 GM/100ML-% IV SOLN
2.0000 g | INTRAVENOUS | Status: AC
  Administered 2020-09-14: 2 g via INTRAVENOUS
  Filled 2020-09-14: qty 100

## 2020-09-14 MED ORDER — OMEPRAZOLE 20 MG PO CPDR: 20.0000 mg | DELAYED_RELEASE_CAPSULE | Freq: Every day | ORAL | 0 refills | Status: AC

## 2020-09-14 MED ORDER — SODIUM CHLORIDE 0.9 % IR SOLN
Status: DC | PRN
  Administered 2020-09-14: 1000 mL

## 2020-09-14 MED ORDER — BUPIVACAINE LIPOSOME 1.3 % IJ SUSP
INTRAMUSCULAR | Status: DC | PRN
  Administered 2020-09-14: 10 mL via PERINEURAL

## 2020-09-14 MED ORDER — ROCURONIUM BROMIDE 10 MG/ML (PF) SYRINGE
PREFILLED_SYRINGE | INTRAVENOUS | Status: AC
  Filled 2020-09-14: qty 10

## 2020-09-14 MED ORDER — AMISULPRIDE (ANTIEMETIC) 5 MG/2ML IV SOLN: 10.0000 mg | Freq: Once | INTRAVENOUS | Status: DC | PRN

## 2020-09-14 MED ORDER — DEXAMETHASONE SODIUM PHOSPHATE 10 MG/ML IJ SOLN
INTRAMUSCULAR | Status: DC | PRN
  Administered 2020-09-14: 8 mg via INTRAVENOUS

## 2020-09-14 MED ORDER — FENTANYL CITRATE PF 50 MCG/ML IJ SOSY: 25.0000 ug | PREFILLED_SYRINGE | INTRAMUSCULAR | Status: DC | PRN

## 2020-09-14 MED ORDER — ONDANSETRON HCL 4 MG/2ML IJ SOLN
INTRAMUSCULAR | Status: AC
  Filled 2020-09-14: qty 2

## 2020-09-14 MED ORDER — PROPOFOL 10 MG/ML IV BOLUS
INTRAVENOUS | Status: AC
  Filled 2020-09-14: qty 20

## 2020-09-14 MED ORDER — PROPOFOL 10 MG/ML IV BOLUS
INTRAVENOUS | Status: DC | PRN
  Administered 2020-09-14: 130 mg via INTRAVENOUS

## 2020-09-14 MED ORDER — ACETAMINOPHEN 500 MG PO TABS: 1000.0000 mg | ORAL_TABLET | Freq: Three times a day (TID) | ORAL | 0 refills | Status: AC

## 2020-09-14 MED ORDER — FENTANYL CITRATE (PF) 100 MCG/2ML IJ SOLN
INTRAMUSCULAR | Status: DC | PRN
  Administered 2020-09-14: 50 ug via INTRAVENOUS

## 2020-09-14 MED ORDER — ROCURONIUM BROMIDE 10 MG/ML (PF) SYRINGE
PREFILLED_SYRINGE | INTRAVENOUS | Status: DC | PRN
  Administered 2020-09-14: 50 mg via INTRAVENOUS

## 2020-09-14 MED ORDER — GABAPENTIN 300 MG PO CAPS
300.0000 mg | ORAL_CAPSULE | Freq: Once | ORAL | Status: AC
  Administered 2020-09-14: 300 mg via ORAL
  Filled 2020-09-14: qty 1

## 2020-09-14 MED ORDER — ASPIRIN 81 MG PO CHEW: 81.0000 mg | CHEWABLE_TABLET | Freq: Two times a day (BID) | ORAL | 0 refills | Status: AC

## 2020-09-14 MED ORDER — LACTATED RINGERS IV BOLUS
500.0000 mL | Freq: Once | INTRAVENOUS | Status: AC
  Administered 2020-09-14: 500 mL via INTRAVENOUS

## 2020-09-14 SURGICAL SUPPLY — 78 items
AID PSTN UNV HD RSTRNT DISP (MISCELLANEOUS) ×1
APL PRP STRL LF DISP 70% ISPRP (MISCELLANEOUS) ×2
BAG COUNTER SPONGE SURGICOUNT (BAG) ×2 IMPLANT
BAG SPNG CNTER NS LX DISP (BAG) ×1
BASEPLATE GLENOSPHERE 25 STD (Miscellaneous) ×1 IMPLANT
BIT DRILL 3.2 PERIPHERAL SCREW (BIT) ×1 IMPLANT
BLADE SAW SAG 73X25 THK (BLADE) ×1
BLADE SAW SGTL 73X25 THK (BLADE) ×1 IMPLANT
BODY PROXIMAL PTC 11 132.5D (Spacer) IMPLANT
BSPLAT GLND STD 25 RVRS SHLDR (Miscellaneous) ×1 IMPLANT
CAP LOCKING COCR (Cap) ×1 IMPLANT
CHLORAPREP W/TINT 26 (MISCELLANEOUS) ×4 IMPLANT
CLSR STERI-STRIP ANTIMIC 1/2X4 (GAUZE/BANDAGES/DRESSINGS) ×2 IMPLANT
COOLER ICEMAN CLASSIC (MISCELLANEOUS) ×2 IMPLANT
COVER BACK TABLE 60X90IN (DRAPES) ×1 IMPLANT
COVER SURGICAL LIGHT HANDLE (MISCELLANEOUS) ×2 IMPLANT
DRAPE C-ARM 42X120 X-RAY (DRAPES) IMPLANT
DRAPE INCISE IOBAN 66X45 STRL (DRAPES) ×2 IMPLANT
DRAPE ORTHO SPLIT 77X108 STRL (DRAPES) ×4
DRAPE SHEET LG 3/4 BI-LAMINATE (DRAPES) ×4 IMPLANT
DRAPE SURG ORHT 6 SPLT 77X108 (DRAPES) ×2 IMPLANT
DRESSING AQUACEL AG SP 3.5X6 (GAUZE/BANDAGES/DRESSINGS) ×1 IMPLANT
DRSG AQUACEL AG ADV 3.5X 6 (GAUZE/BANDAGES/DRESSINGS) ×2 IMPLANT
DRSG AQUACEL AG SP 3.5X6 (GAUZE/BANDAGES/DRESSINGS) ×2
ELECT BLADE TIP CTD 4 INCH (ELECTRODE) ×2 IMPLANT
ELECT REM PT RETURN 15FT ADLT (MISCELLANEOUS) ×2 IMPLANT
FACESHIELD WRAPAROUND (MASK) ×2 IMPLANT
FACESHIELD WRAPAROUND OR TEAM (MASK) ×1 IMPLANT
GLENOSPHERE REV SHOULDER 36 (Joint) ×1 IMPLANT
GLOVE SRG 8 PF TXTR STRL LF DI (GLOVE) ×1 IMPLANT
GLOVE SURG ENC MOIS LTX SZ6.5 (GLOVE) ×4 IMPLANT
GLOVE SURG NEOPR MICRO LF SZ8 (GLOVE) ×4 IMPLANT
GLOVE SURG UNDER POLY LF SZ6.5 (GLOVE) ×2 IMPLANT
GLOVE SURG UNDER POLY LF SZ8 (GLOVE) ×2
GOWN STRL REUS W/TWL LRG LVL3 (GOWN DISPOSABLE) ×2 IMPLANT
GOWN STRL REUS W/TWL XL LVL3 (GOWN DISPOSABLE) ×2 IMPLANT
GUIDEWIRE GLENOID 2.5X220 (WIRE) ×1 IMPLANT
HANDPIECE INTERPULSE COAX TIP (DISPOSABLE) ×2
HEMOSTAT SURGICEL 2X14 (HEMOSTASIS) IMPLANT
IMPL REVERSE SHOULDER 0X3.5 (Shoulder) ×1 IMPLANT
IMPLANT REVERSE SHOULDER 0X3.5 (Shoulder) ×2 IMPLANT
INSERT HUMERAL 36X6MM 12.5DEG (Insert) ×2 IMPLANT
KIT BASIN OR (CUSTOM PROCEDURE TRAY) ×2 IMPLANT
KIT STABILIZATION SHOULDER (MISCELLANEOUS) ×2 IMPLANT
KIT TURNOVER KIT A (KITS) ×2 IMPLANT
MANIFOLD NEPTUNE II (INSTRUMENTS) ×2 IMPLANT
NDL MAYO 6 CRC TAPER PT (NEEDLE) IMPLANT
NDL MAYO CATGUT SZ4 TPR NDL (NEEDLE) IMPLANT
NEEDLE MAYO 6 CRC TAPER PT (NEEDLE) ×2 IMPLANT
NEEDLE MAYO CATGUT SZ4 (NEEDLE) IMPLANT
NS IRRIG 1000ML POUR BTL (IV SOLUTION) ×2 IMPLANT
PACK SHOULDER (CUSTOM PROCEDURE TRAY) ×2 IMPLANT
PAD COLD SHLDR WRAP-ON (PAD) ×2 IMPLANT
PENCIL SMOKE EVACUATOR (MISCELLANEOUS) ×1 IMPLANT
PROXIMAL BODY PTC 11 132.5D (Spacer) ×2 IMPLANT
RESTRAINT HEAD UNIVERSAL NS (MISCELLANEOUS) ×2 IMPLANT
SCREW 5.5X22 (Screw) ×1 IMPLANT
SCREW ASSEMBLY COCR TYPE 0 (Screw) ×2 IMPLANT
SCREW BONE 6.5 OD 30 NON BIO (Screw) ×2 IMPLANT
SCREW PERIPHERAL 5.0X34 (Screw) ×1 IMPLANT
SET HNDPC FAN SPRY TIP SCT (DISPOSABLE) ×1 IMPLANT
SLING ULTRA II L (ORTHOPEDIC SUPPLIES) IMPLANT
SLING ULTRA III MED (ORTHOPEDIC SUPPLIES) ×2 IMPLANT
STEM PRTL DISTAL 11 SHOULDER (Miscellaneous) ×1 IMPLANT
STRIP CLOSURE SKIN 1/2X4 (GAUZE/BANDAGES/DRESSINGS) ×1 IMPLANT
SUCTION FRAZIER HANDLE 12FR (TUBING) ×2
SUCTION TUBE FRAZIER 12FR DISP (TUBING) ×1 IMPLANT
SUT ETHIBOND 2 V 37 (SUTURE) ×2 IMPLANT
SUT ETHIBOND NAB CT1 #1 30IN (SUTURE) ×2 IMPLANT
SUT FIBERWIRE #5 38 CONV NDL (SUTURE) ×12
SUT MNCRL AB 4-0 PS2 18 (SUTURE) ×2 IMPLANT
SUT VIC AB 0 CT1 36 (SUTURE) IMPLANT
SUT VIC AB 3-0 SH 27 (SUTURE) ×4
SUT VIC AB 3-0 SH 27X BRD (SUTURE) ×1 IMPLANT
SUTURE FIBERWR #5 38 CONV NDL (SUTURE) IMPLANT
TOWEL OR 17X26 10 PK STRL BLUE (TOWEL DISPOSABLE) ×2 IMPLANT
TUBE SUCTION HIGH CAP CLEAR NV (SUCTIONS) ×2 IMPLANT
WATER STERILE IRR 1000ML POUR (IV SOLUTION) ×4 IMPLANT

## 2020-09-14 NOTE — Progress Notes (Signed)
Assisted Dr. Rob Fitzgerald with left, ultrasound guided, interscalene  block. Side rails up, monitors on throughout procedure. See vital signs in flow sheet. Tolerated Procedure well. 

## 2020-09-14 NOTE — Op Note (Signed)
Orthopaedic Surgery Operative Note (CSN: [id number])  [place]  [birth year] Date of Surgery: 09/14/2020   Diagnoses:  Left proximal humerus fracture, four-part with head split  Procedure: Left reverse total Shoulder Arthroplasty   Operative Finding Successful completion of planned procedure.  Routine case, patient had a head split which was visible upon removing the articulation.  Axillary nerve was relatively tight however it was intact at the beginning of the case and we protected it throughout.  Patient's bone quality was reasonable however the relatively distal extension of the fracture meant that we had to use longstem components.  Post-operative plan: The patient will be NWB in sling.  The patient will be will be discharged from PACU if continues to be stable as was plan prior to surgery.  DVT prophylaxis Aspirin 81 mg twice daily for 6 weeks.  Pain control with PRN pain medication preferring oral medicines.  Follow up plan will be scheduled in approximately 7 days for incision check and XR.  Physical therapy to start after 4 weeks.  Implants: Tornier revive size 11 partially coated distal stem, no spacer, 11 proximal body, 0 high offset tray with a 36+6 poly-, 25 standard baseplate with a 35 center screw and 36 glenosphere  Post-Op Diagnosis: Same Surgeons:Primary: Hiram Gash, MD Assistants:Caroline McBane PA-C Location: [facility] ROOM 06 Anesthesia: General with Exparel Interscalene Antibiotics: Ancef 2g preop, Vancomycin 1096m locally Tourniquet time: None Estimated Blood Loss: 1938Complications: None Specimens: None Implants: Implant Name Type Inv. Item Serial No. Manufacturer Lot No. LRB No. Used Action  BASEPLATE GLENOSPHERE 210FBSTD - [id number] BASEPLATE GLENOSPHERE 278EUSTD  [employer] [id number] 1 Implanted  SCREW BONE 6.5 OD 30 NON BIO - [id number] SCREW BONE 6.5 OD 30 NON BIO  [employer]  Left 1 Implanted  SCREW 5.5X22 - [id number]  SCREW 5.5X22  [employer]  Left 1 Implanted  SCREW PERIPHERAL 5.0X34 - [id number] SCREW PERIPHERAL 5.0X34  [employer]  Left 1 Implanted  GLENOSPHERE REV SHOULDER 36 - [id number] GLENOSPHERE REV SHOULDER 36  [employer] [id number] 1 Implanted  IMPLANT REVERSE SHOULDER 0X3.5 - [id number] IMPLANT REVERSE SHOULDER 0X3.5  [employer] [id number] 1 Implanted  INSERT HUMERAL 36X6MM 12.5DEG - [id number] INSERT HUMERAL 36X6MM 12.5DEG  [employer] [id number] 1 Implanted  SCREW ASSEMBLY COCR TYPE 0 - [id number] SCREW ASSEMBLY COCR TYPE 0  [employer] [id number] 1 Implanted  [facility] [id number] CAP LOCKING COCR  [employer] [id number] 1 Implanted  STEM PRTL DISTAL 11 SHOULDER - [id number] STEM PRTL DISTAL 11 SHOULDER  [employer] [id number] 1 Implanted  PROXIMAL BODY PTC 11 132.5D - [id number] PROXIMAL BODY PTC 11 132.5D  [employer] [id number] 1 Implanted    Indications for Surgery:   Breanna RICHOUXis a 80y.o. female with fall resulting in complex proximal humerus fracture with head split.  Benefits and risks of operative and nonoperative management were discussed prior to surgery with patient/guardian(s) and informed consent form was completed.  Infection and need for further surgery were discussed as was prosthetic stability and cuff issues.  We additionally specifically discussed risks of axillary nerve injury, infection, periprosthetic fracture, continued pain and longevity of implants prior to beginning procedure.      Procedure:   The patient was identified in the preoperative holding area where the surgical site was marked. Block placed by anesthesia with exparel.  The patient  was taken to the OR where a procedural timeout was called and the above noted anesthesia was induced.  The patient was positioned beachchair on allen table with spider arm positioner.  Preoperative antibiotics were dosed.   The patient's left shoulder was prepped and draped in the usual sterile fashion.  A second preoperative timeout was called.       Standard deltopectoral approach was performed with a #10 blade. We dissected down to the subcutaneous tissues and the cephalic vein was taken laterally with the deltoid. Clavipectoral fascia was incised in line with the incision. Deep retractors were placed. The long of the biceps tendon was identified and there was significant tenosynovitis present.  Tenodesis was performed to the pectoralis tendon with #2 Ethibond. The remaining biceps was followed up into the rotator interval where it was released.    We used the bicipital groove as a landmark for the lesser and greater tuberosity fragments.  We were able to mobilize the lesser tuberosity fragment and placed stay sutures in the bone tendon junction to help with mobilization.  This point we were able to identify the  greater tuberosity fragment and 4 #5  FiberWire sutures were used to place into this for eventual repair of the tuberosities.  Once these were both mobilized we took care to identify the shaft fragment as well as the head fragment.  We carefully identified the head fragment were able to manually remove it.  At this point the axillary nerve was found and palpated and with a tug test noted to be intact.  Protected throughout the remainder of the case with blunt retractors.    We then released the SGHL with bovie cautery prior to placing a curved mayo at the junction of the anterior glenoid well above the axillary nerve and bluntly dissecting the subscapularis from the capsule.  We then carefully protected the axillary nerve as we gently released the inferior capsule to fully mobilize the subscapularis.  An anterior deltoid retractor was then placed as well as a small Hohmann retractor superiorly.   The glenoid was relatively preserved as we would expect in this fracture patient.  The remaining labrum was removed  circumferentially taking great care not to disrupt the posterior capsule.    The glenoid drill guide was placed and used to drill a guide pin in the center, inferior position. The glenoid face was then reamed concentrically over the guide wire. The center hole was drilled over the guidepin in a near anatomic angle of version. Next the glenoid vault was drilled back to a depth of 35 mm.  We tapped and then placed a 4m size baseplate with 0 lateralization was selected with a 6.5 mm x 357mlength central screw.  The base plate was screwed into the glenoid vault obtaining secure fixation. We next placed superior and inferior locking screws for additional fixation.  Next a 36 mm glenosphere was selected and impacted onto the baseplate. The center screw was tightened.   We then repositioned the arm to give access to the humeral shaft fragment.  Drill holes were placed and fiberwire sutures in the shaft for vertical fixation of the tuberosities.  We broached with the Revive stem implants  starting with a size 9 reamer and reaming up to 11 which obtained an appropriate fit.  The proximal body was sized separately and attached to trial and achieve a stable articulation.    We trialed with multiple size tray and polyethylene options and selected a 0 high which  provided good stability and range of motion without excess soft tissue tension. The offset was dialed in to match the normal anatomy. The shoulder was trialed.  There was good ROM in all planes and the shoulder was stable with no inferior translation.   We then mobilized her tuberosities again and placed the anterior deep limbs of the 4 #5 fiber wires around the stem.  1 of these was tied down fixing the greater tuberosity in place after bone graft harvest from the humeral head component was placed underneath.  A +0 high offset tray was selected and impacted onto the stem.   A 36+6 polyethylene liner was impacted onto the stem.  The joint was reduced and  thoroughly irrigated with pulsatile lavage. The remaining sutures were then placed through the subscapularis and the bone tendon junction and the tuberosities were reduced after bone graft placed beneath as autograft at the subscap.  We horizontally secured the tuberosities before placing vertical fixation with the suture that was placed into the shaft.  Tuberosities moved as a unit were happy with her overall reduction.  This was checked on fluoroscopy confirming our position.  We irrigated copiously at this point.  Hemostasis was obtained. The deltopectoral interval was reapproximated with #1 Ethibond. The subcutaneous tissues were closed with 3-0 Vicryl and the skin was closed with running monocryl.     The wounds were cleaned and dried and an Aquacel dressing was placed. The drapes taken down. The arm was placed into sling with abduction pillow. Patient was awakened, extubated, and transferred to the recovery room in stable condition. There were no intraoperative complications. The sponge, needle, and attention counts were correct at the end of the case.     Breanna Chapel, PA-C, present and scrubbed throughout the case, critical for completion in a timely fashion, and for retraction, instrumentation, closure.

## 2020-09-14 NOTE — Anesthesia Preprocedure Evaluation (Signed)
Anesthesia Evaluation  Patient identified by MRN, date of birth, ID band Patient awake    Reviewed: Allergy & Precautions, NPO status , Patient's Chart, lab work & pertinent test results  Airway Mallampati: II  TM Distance: >3 FB Neck ROM: Full    Dental  (+) Dental Advisory Given   Pulmonary neg pulmonary ROS,    breath sounds clear to auscultation       Cardiovascular hypertension, Pt. on medications  Rhythm:Regular Rate:Normal     Neuro/Psych negative neurological ROS     GI/Hepatic Neg liver ROS, GERD  ,  Endo/Other  Hypothyroidism   Renal/GU negative Renal ROS     Musculoskeletal  (+) Arthritis ,   Abdominal   Peds  Hematology negative hematology ROS (+)   Anesthesia Other Findings   Reproductive/Obstetrics                             Lab Results  Component Value Date   WBC 5.6 02/06/2019   HGB 13.0 02/06/2019   HCT 39.0 02/06/2019   MCV 91.3 02/06/2019   PLT 236 02/06/2019   Lab Results  Component Value Date   CREATININE 0.77 02/06/2019   BUN 19 02/06/2019   NA 139 02/06/2019   K 3.7 02/06/2019   CL 102 02/06/2019   CO2 27 02/06/2019    Anesthesia Physical Anesthesia Plan  ASA: 2  Anesthesia Plan: General   Post-op Pain Management:  Regional for Post-op pain   Induction: Intravenous  PONV Risk Score and Plan: 3 and Ondansetron, Dexamethasone and Treatment may vary due to age or medical condition  Airway Management Planned: Oral ETT  Additional Equipment: None  Intra-op Plan:   Post-operative Plan: Extubation in OR  Informed Consent: I have reviewed the patients History and Physical, chart, labs and discussed the procedure including the risks, benefits and alternatives for the proposed anesthesia with the patient or authorized representative who has indicated his/her understanding and acceptance.     Dental advisory given  Plan Discussed with:  CRNA  Anesthesia Plan Comments:         Anesthesia Quick Evaluation

## 2020-09-14 NOTE — Interval H&P Note (Signed)
All questions answered

## 2020-09-14 NOTE — Anesthesia Procedure Notes (Signed)
Anesthesia Regional Block: Interscalene brachial plexus block   Pre-Anesthetic Checklist: , timeout performed,  Correct Patient, Correct Site, Correct Laterality,  Correct Procedure, Correct Position, site marked,  Risks and benefits discussed,  Surgical consent,  Pre-op evaluation,  At surgeon's request and post-op pain management  Laterality: Left  Prep: chloraprep       Needles:  Injection technique: Single-shot  Needle Type: Echogenic Stimulator Needle     Needle Length: 9cm  Needle Gauge: 21     Additional Needles:   Procedures:, nerve stimulator,,, ultrasound used (permanent image in chart),,     Nerve Stimulator or Paresthesia:  Response: deltoid, 0.5 mA  Additional Responses:   Narrative:  Start time: 09/14/2020 9:45 AM End time: 09/14/2020 9:51 AM Injection made incrementally with aspirations every 5 mL.  Performed by: Personally  Anesthesiologist: Suzette Battiest, MD

## 2020-09-14 NOTE — Anesthesia Postprocedure Evaluation (Signed)
Anesthesia Post Note  Patient: Breanna Hill  Procedure(s) Performed: REVERSE SHOULDER ARTHROPLASTY (Left: Shoulder)     Patient location during evaluation: PACU Anesthesia Type: General Level of consciousness: awake and alert Pain management: pain level controlled Vital Signs Assessment: post-procedure vital signs reviewed and stable Respiratory status: spontaneous breathing, nonlabored ventilation, respiratory function stable and patient connected to nasal cannula oxygen Cardiovascular status: blood pressure returned to baseline and stable Postop Assessment: no apparent nausea or vomiting Anesthetic complications: no   No notable events documented.  Last Vitals:  Vitals:   09/14/20 1315 09/14/20 1345  BP: (!) 127/102 (!) 144/74  Pulse: 76 81  Resp: 17 16  Temp:    SpO2: 95% 94%    Last Pain:  Vitals:   09/14/20 1345  TempSrc:   PainSc: 0-No pain                 Tiajuana Amass

## 2020-09-14 NOTE — Transfer of Care (Signed)
Immediate Anesthesia Transfer of Care Note  Patient: Breanna Hill  Procedure(s) Performed: REVERSE SHOULDER ARTHROPLASTY (Left: Shoulder)  Patient Location: PACU  Anesthesia Type:General  Level of Consciousness: awake, alert , oriented and patient cooperative  Airway & Oxygen Therapy: Patient Spontanous Breathing and Patient connected to face mask oxygen  Post-op Assessment: Report given to RN, Post -op Vital signs reviewed and stable and Patient moving all extremities  Post vital signs: Reviewed and stable  Last Vitals:  Vitals Value Taken Time  BP 135/78 09/14/20 1241  Temp    Pulse 71 09/14/20 1244  Resp 11 09/14/20 1244  SpO2 100 % 09/14/20 1244  Vitals shown include unvalidated device data.  Last Pain:  Vitals:   09/14/20 0918  TempSrc:   PainSc: 9          Complications: No notable events documented.

## 2020-09-14 NOTE — Discharge Instructions (Signed)
Ophelia Charter MD, MPH Noemi Chapel, PA-C [place] [address] [phone] (tel)   [phone] (fax)   [place] may leave the operative dressing in place until your follow-up appointment. KEEP THE INCISIONS CLEAN AND DRY. There may be a small amount of fluid/bleeding leaking at the surgical site. This is normal after surgery.  If it fills with liquid or blood please call us immediately to change it for you. Use the provided ice machine or Ice packs as often as possible for the first 3-4 days, then as needed for pain relief.   Keep a layer of cloth or a shirt between your skin and the cooling unit to prevent frost bite as it can get very cold.  SHOWERING: - You may shower on Post-Op Day #2.  - The dressing is water resistant but do not scrub it as it may start to peel up.   - You may remove the sling for showering, but keep a water resistant pillow under the arm to keep both the  elbow and shoulder away from the body (mimicking the abduction sling).  - Gently pat the area dry.  - Do not soak the shoulder in water. Do not go swimming in the pool or ocean until your incisions have completely healed (about 4-6 weeks after surgery) - KEEP THE INCISIONS CLEAN AND DRY.  EXERCISES Wear the sling at all times Accidental/Purposeful External Rotation and shoulder flexion (reaching behind you) is to be avoided at all costs for the first month. It is ok to come out of your sling if your are sitting and have assistance for eating.   Do not lift anything heavier than 1 pound until we discuss it further in clinic.  REGIONAL ANESTHESIA (NERVE BLOCKS) The anesthesia team may have performed a nerve block for you if safe in the setting of your care.  This is a great tool used to minimize pain.  Typically the block may start wearing off overnight but the long acting medicine may last for 3-4 days.   The nerve block wearing off can be a challenging period but please utilize your as needed pain medications to try and manage this period.    POST-OP MEDICATIONS- Multimodal approach to pain control In general your pain will be controlled with a combination of substances.  Prescriptions unless otherwise discussed are electronically sent to your pharmacy.  This is a carefully made plan we use to minimize narcotic use.     Celebrex - Anti-inflammatory medication taken on a scheduled basis Acetaminophen - Non-narcotic pain medicine taken on a scheduled basis  Oxycodone - This is a strong narcotic, to be used only on an "as needed" basis for SEVERE pain. Aspirin '81mg'$  - This medicine is used to minimize the risk of blood clots after surgery. Omeprazole - daily medicine to protect your stomach while taking anti-inflammatories. Zofran -  take as needed for nausea   FOLLOW-UP If you develop a Fever (>101.5), Redness or Drainage from the surgical incision site, please call our office to arrange for an evaluation. Please call the office to schedule a follow-up appointment for a wound check, 7-10 days post-operatively.  IF YOU HAVE ANY QUESTIONS, PLEASE FEEL FREE TO CALL OUR OFFICE.  HELPFUL INFORMATION  If you had a block, it will wear off between 8-24 hrs postop typically.  This is period when your pain may go from nearly zero to the pain you  would have had post-op without the block.  This is an abrupt transition but nothing dangerous is happening.  You may take an extra dose of narcotic when this happens.  Your arm will be in a sling following surgery. You will be in this sling for the next 4 weeks.  I will let you know the exact duration at your follow-up visit.  You may be more comfortable sleeping in a semi-seated position the first few nights following surgery.  Keep a pillow propped under the elbow and forearm for comfort.  If you have a recliner type of chair it might be beneficial.  If not  that is fine too, but it would be helpful to sleep propped up with pillows behind your operated shoulder as well under your elbow and forearm.  This will reduce pulling on the suture lines.  When dressing, put your operative arm in the sleeve first.  When getting undressed, take your operative arm out last.  Loose fitting, button-down shirts are recommended.  In most states it is against the law to drive while your arm is in a sling. And certainly against the law to drive while taking narcotics.  You may return to work/school in the next couple of days when you feel up to it. Desk work and typing in the sling is fine.  We suggest you use the pain medication the first night prior to going to bed, in order to ease any pain when the anesthesia wears off. You should avoid taking pain medications on an empty stomach as it will make you nauseous.  Do not drink alcoholic beverages or take illicit drugs when taking pain medications.  Pain medication may make you constipated.  Below are a few solutions to try in this order: Decrease the amount of pain medication if you aren't having pain. Drink lots of decaffeinated fluids. Drink prune juice and/or each dried prunes  If the first 3 don't work start with additional solutions Take Colace - an over-the-counter stool softener Take Senokot - an over-the-counter laxative Take Miralax - a stronger over-the-counter laxative   Dental Antibiotics:  In most cases prophylactic antibiotics for Dental procdeures after total joint surgery are not necessary.  Exceptions are as follows:  1. History of prior total joint infection  2. Severely immunocompromised (Organ Transplant, cancer chemotherapy, Rheumatoid biologic meds such as [place])  3. Poorly controlled diabetes (A1C &gt; 8.0, blood glucose over 200)  If you have one of these conditions, contact your surgeon for an antibiotic prescription, prior to your dental procedure.   For more information  including helpful videos and documents visit our website:   [email or web address]

## 2020-09-14 NOTE — Anesthesia Procedure Notes (Signed)
Procedure Name: Intubation Date/Time: 09/14/2020 10:52 AM Performed by: Niel Hummer, CRNA Pre-anesthesia Checklist: Patient identified, Emergency Drugs available, Suction available and Patient being monitored Patient Re-evaluated:Patient Re-evaluated prior to induction Oxygen Delivery Method: Circle system utilized Preoxygenation: Pre-oxygenation with 100% oxygen Induction Type: IV induction Ventilation: Mask ventilation without difficulty Laryngoscope Size: Mac and 4 Grade View: Grade II Tube type: Oral Tube size: 7.0 mm Number of attempts: 1 Airway Equipment and Method: Stylet Placement Confirmation: ETT inserted through vocal cords under direct vision, positive ETCO2 and breath sounds checked- equal and bilateral Secured at: 21 cm Tube secured with: Tape Dental Injury: Teeth and Oropharynx as per pre-operative assessment

## 2020-09-19 ENCOUNTER — Encounter (HOSPITAL_COMMUNITY): Payer: Self-pay | Admitting: Orthopaedic Surgery

## 2020-09-22 DIAGNOSIS — S42202D Unspecified fracture of upper end of left humerus, subsequent encounter for fracture with routine healing: Secondary | ICD-10-CM | POA: Diagnosis not present

## 2020-09-23 ENCOUNTER — Other Ambulatory Visit (HOSPITAL_COMMUNITY): Payer: Self-pay | Admitting: Orthopaedic Surgery

## 2020-09-23 ENCOUNTER — Other Ambulatory Visit: Payer: Self-pay

## 2020-09-23 ENCOUNTER — Ambulatory Visit (HOSPITAL_COMMUNITY)
Admission: RE | Admit: 2020-09-23 | Discharge: 2020-09-23 | Disposition: A | Discharge status: Home / Self Care | Payer: Medicare HMO | Source: Ambulatory Visit | Attending: Orthopaedic Surgery | Admitting: Orthopaedic Surgery

## 2020-09-23 DIAGNOSIS — M79605 Pain in left leg: Secondary | ICD-10-CM | POA: Insufficient documentation

## 2020-09-23 DIAGNOSIS — M7989 Other specified soft tissue disorders: Secondary | ICD-10-CM | POA: Diagnosis not present

## 2020-09-23 NOTE — Progress Notes (Signed)
Lower extremity venous has been completed.   Preliminary results in CV Proc.   Breanna Hill 09/23/2020 5:05 PM

## 2020-10-03 ENCOUNTER — Other Ambulatory Visit: Payer: Self-pay | Admitting: Hematology and Oncology

## 2020-10-03 DIAGNOSIS — Z09 Encounter for follow-up examination after completed treatment for conditions other than malignant neoplasm: Secondary | ICD-10-CM

## 2020-10-13 ENCOUNTER — Ambulatory Visit (HOSPITAL_COMMUNITY): Payer: Medicare HMO

## 2020-10-13 DIAGNOSIS — S42202D Unspecified fracture of upper end of left humerus, subsequent encounter for fracture with routine healing: Secondary | ICD-10-CM | POA: Diagnosis not present

## 2020-10-14 ENCOUNTER — Other Ambulatory Visit: Payer: Self-pay

## 2020-10-14 ENCOUNTER — Encounter (HOSPITAL_COMMUNITY): Payer: Self-pay

## 2020-10-14 ENCOUNTER — Ambulatory Visit (HOSPITAL_COMMUNITY): Payer: Medicare HMO | Attending: Orthopaedic Surgery

## 2020-10-14 DIAGNOSIS — R29898 Other symptoms and signs involving the musculoskeletal system: Secondary | ICD-10-CM | POA: Insufficient documentation

## 2020-10-14 DIAGNOSIS — M25612 Stiffness of left shoulder, not elsewhere classified: Secondary | ICD-10-CM | POA: Diagnosis not present

## 2020-10-14 DIAGNOSIS — M25512 Pain in left shoulder: Secondary | ICD-10-CM | POA: Diagnosis not present

## 2020-10-14 NOTE — Patient Instructions (Signed)
Complete 2-3 times a day.  1) SHOULDER: Flexion On Table   Place hands on towel placed on table, elbows straight. Lean forward with you upper body, pushing towel away from body.  __10-15_ reps per set\  2) Abduction (Passive)   With arm out to side, resting on towel placed on table with palm DOWN, keeping trunk away from table, lean to the side while pushing towel away from body.  Repeat __10-15__ times.   Copyright  VHI. All rights reserved.     3) Internal Rotation (Assistive)   Seated with elbow bent at right angle and held against side, slide arm on table surface in an inward arc keeping elbow anchored in place. Repeat _10-15___ times.  Activity: Use this motion to brush crumbs off the table.  Copyright  VHI. All rights reserved.  sli

## 2020-10-14 NOTE — Therapy (Signed)
[place], [place], [address] Phone: [phone]   Fax:  [phone]  Occupational Therapy Evaluation  Patient Details  Name: Breanna Hill MRN: [id number] Date of Birth: 08-11-1940 Referring Provider (OT): Ophelia Charter, MD   Encounter Date: 10/14/2020   OT End of Session - 10/14/20 1222     Visit Number 1    Number of Visits 24    Date for OT Re-Evaluation 01/06/21   mini reassessment: 11/11/20   Authorization Type 1) [employer] HMO 2) [employer]    Authorization Time Period 1) $35 copay no visit limit 2) $30 copay no authorization needed.    OT Start Time 0945    OT Stop Time 1030    OT Time Calculation (min) 45 min    Activity Tolerance Patient tolerated treatment well    Behavior During Therapy WFL for tasks assessed/performed             Past Medical History:  Diagnosis Date   Arthritis    R hand    Breast cancer ([place])    Breast disorder    cancer   Cancer ([place])    breast CA- diagnosed with needle biopsy   Depression    GERD (gastroesophageal reflux disease)    rare use of tums    High cholesterol 10/17/2015   History of radiation therapy 03/27/17- 04/23/17   Left Breast, 2.67 Gy in 15 fractions for a total dose of 40.05 Gy. Boost, 2 Gy in 5 fractions for a total dose of 10 Gy.    Hypertension    Hypothyroidism    Pre-diabetes    Hgba1C- in the 6 's , per pt., she monitors her diet    Past Surgical History:  Procedure Laterality Date   BACK SURGERY  1973   BREAST LUMPECTOMY Left 02/2017   BREAST LUMPECTOMY WITH RADIOACTIVE SEED AND SENTINEL LYMPH NODE BIOPSY Left 02/27/2017   Procedure: LEFT BREAST LUMPECTOMY WITH RADIOACTIVE SEED AND LEFT AXILLARY SENTINEL LYMPH NODE BIOPSY;  Surgeon: Donnie Mesa, MD;  Location: [facility];  Service: General;  Laterality: Left;   COLONOSCOPY     X 2   COLONOSCOPY N/A 02/22/2016   Procedure: COLONOSCOPY;  Surgeon: Rogene Houston, MD;   Location[facility];  Service: Endoscopy;  Laterality: N/A;  1:45   EYE SURGERY Bilateral    phlebpheroplasty   Left shoultder surgery     for bone spurs & frozen shoulder    REVERSE SHOULDER ARTHROPLASTY Left 09/14/2020   Procedure: REVERSE SHOULDER ARTHROPLASTY;  Surgeon: Hiram Gash, MD;  Location: [facility];  Service: Orthopedics;  Laterality: Left;   Right bunionectomy     TONSILLECTOMY     TUBAL LIGATION      There were no vitals filed for this visit.   Subjective Assessment - 10/14/20 0955     Subjective  S: I was sweeping under the bed and I tripped over the broom.    Pertinent History Patient is a 80 y/o female S/P left reverse total shoulder arthroplasty which was completed on 09/14/20 after patient sustained a proximal humerus fracture due to a mechanical fall. Pt was removed from her sling yesterday at her follow up appointment. Dr. Griffin Basil has referred patient to occupational therapy for evaluation and treatment.    Patient Stated Goals To return to using her arm for all daily tasks.    Currently in Pain? No/denies  [facility] OT Assessment - 10/14/20 0954       Assessment   Medical Diagnosis S/P left reverse total shoulder arthroplasty    Referring Provider (OT) Ophelia Charter, MD    Onset Date/Surgical Date 09/14/20    Hand Dominance Right    Next MD Visit --   in Dec. 2022   Prior Therapy None for shoulder      Precautions   Precautions Shoulder;Other (comment)    Type of Shoulder Precautions see media tab for protocol.    Precaution Comments History of breast cancer and left side mastectomy      Restrictions   Weight Bearing Restrictions Yes    LUE Weight Bearing Non weight bearing      Balance Screen   Has the patient fallen in the past 6 months Yes    How many times? 1    Has the patient had a decrease in activity level because of a fear of falling?  No    Is the patient reluctant to leave their home because of a fear of falling?  No       Home  Environment   Family/patient expects to be discharged to: Private residence    [place] Retired      ADL   ADL comments Difficulty completing all ADL tasks especially raising her arm up, pulling pants.      Mobility   Mobility Status Independent      Written Expression   Dominant Hand Right      Vision - History   Baseline Vision Other (comment)   wears on contact     Cognition   Overall Cognitive Status Within Functional Limits for tasks assessed      Observation/Other Assessments   Focus on Therapeutic Outcomes (FOTO)  44/100      Posture/Postural Control   Posture/Postural Control Postural limitations    Postural Limitations Rounded Shoulders;Forward head      ROM / Strength   AROM / PROM / Strength AROM;PROM;Strength      Palpation   Palpation comment Max fascial restrictions palpated in the left upper arm, upper trapezius, and scapularis region.      AROM   Overall AROM Comments Assessed seated. IR/er adducted    AROM Assessment Site Shoulder    Right/Left Shoulder Left    Left Shoulder Flexion 72 Degrees    Left Shoulder ABduction 65 Degrees    Left Shoulder Internal Rotation 90 Degrees    Left Shoulder External Rotation -17 Degrees      PROM   Overall PROM Comments Assessed supine. IR/er adducted    PROM Assessment Site Shoulder    Right/Left Shoulder Left    Left Shoulder Flexion 99 Degrees    Left Shoulder ABduction 80 Degrees    Left Shoulder Internal Rotation 90 Degrees    Left Shoulder External Rotation -15 Degrees      Strength   Overall Strength Unable to assess;Due to precautions                              OT Education - 10/14/20 1222     Education Details table slides    Person(s) Educated Patient    Methods Explanation;Demonstration;Verbal cues;Handout    Comprehension Verbalized understanding;Returned demonstration  OT Short Term Goals - 10/14/20 1231       OT SHORT TERM GOAL #1   Title Patient will be educated and independent with HEP in order to increase her ability to use her LUE for daily tasks 50% of the time or more.    Time 6    Period Weeks    Status New    Target Date 11/25/20      OT SHORT TERM GOAL #2   Title Patient will increase her LUE P/ROM to [facility] in order to increase her ability to complete upper body dressing tasks with less difficulty.    Time 6    Period Weeks    Status New      OT SHORT TERM GOAL #3   Title Patient will increase her LUE strength to 3/5 in order to return to using her LUE to fix her hair.    Time 6    Period Weeks    Status New      OT SHORT TERM GOAL #4   Title Patient will decrease her LUE fascial restrictions to moderate amount in order to increase the functional mobility needed to complete low level reaching tasks.    Time 6    Period Weeks    Status New               OT Long Term Goals - 10/14/20 1234       OT LONG TERM GOAL #1   Title Patient will increase her LUE A/ROM to [facility] in order to be able to complete functional reaching tasks at or above her shoulder level.    Time 12    Period Weeks    Status New    Target Date 01/06/21      OT LONG TERM GOAL #2   Title Patient will increase her LUE strength to 4/5 to return to lifting household items of moderate weight using her LUE.    Time 12    Period Weeks    Status New      OT LONG TERM GOAL #3   Title Pt will report a pain level of approximately 3/10 or less when using her LUE for self care and required daily tasks.    Time 12    Period Weeks    Status New      OT LONG TERM GOAL #4   Title Patient will decrease her LUE fascial restrictions to minimal amount or less in order to increase the functional mobility needed to complete all required reaching tasks.    Time 12    Period Weeks    Status New                   Plan - 10/14/20 1228     Clinical  Impression Statement A: Patient is a 80 y/o female S/P left reverse shoulder arthroplasty causing increased fascial restrictions, pain, and decreased ROM and strength resulting in difficulty completing any daily tasks using her LUE.    OT Occupational Profile and History Problem Focused Assessment - Including review of records relating to presenting problem    Occupational performance deficits (Please refer to evaluation for details): ADL's;IADL's;Leisure    Body Structure / Function / Physical Skills ADL;UE functional use;Pain;ROM;Edema;Strength;Fascial restriction    Rehab Potential Excellent    Clinical Decision Making Limited treatment options, no task modification necessary    Comorbidities Affecting Occupational Performance: Presence of comorbidities impacting occupational performance    Comorbidities impacting occupational performance  description: see medical chart.    Modification or Assistance to Complete Evaluation  No modification of tasks or assist necessary to complete eval    OT Frequency 2x / week    OT Duration 12 weeks    OT Treatment/Interventions Self-care/ADL training;Patient/family education;DME and/or AE instruction;Passive range of motion;Cryotherapy;Moist Heat;Electrical Stimulation;Neuromuscular education;Therapeutic activities;Manual Therapy;Therapeutic exercise;Ultrasound    Plan P: patient will benefit from skilled OT services to increase functional use of her LUE and return to using it for daily tasks. Treatment Plan: Follow protocol. myofascial release, manual stretching, P/ROM, AA/ROM, A/ROM, general shoulder strengthening. Modalities PRN    OT Home Exercise Plan eval: table slides    Consulted and Agree with Plan of Care Patient             Patient will benefit from skilled therapeutic intervention in order to improve the following deficits and impairments:   Body Structure / Function / Physical Skills: ADL, UE functional use, Pain, ROM, Edema, Strength, Fascial  restriction       Visit Diagnosis: Other symptoms and signs involving the musculoskeletal system - Plan: Ot plan of care cert/re-cert  Acute pain of left shoulder - Plan: Ot plan of care cert/re-cert  Stiffness of left shoulder, not elsewhere classified - Plan: Ot plan of care cert/re-cert    Problem List Patient Active Problem List   Diagnosis Date Noted   Malignant neoplasm of upper-inner quadrant of left breast in female, estrogen receptor positive ([place]) 03/05/2017   History of colonic polyps 10/31/2015   High cholesterol 10/17/2015   SHOULDER, ARTHRITIS, DEGEN./OSTEO 02/28/2009   CONTRACTURE OF SHOULDER JOINT 02/16/2009   SHOULDER PAIN 02/16/2009    Ailene Ravel, OTR/L,CBIS  [phone]  10/14/2020, 12:37 PM  [place], [place], [address] Phone: [phone]   Fax:  [phone]  Name: Breanna Hill MRN: [id number] Date of Birth: [birth year]

## 2020-10-18 ENCOUNTER — Encounter (HOSPITAL_COMMUNITY): Payer: Medicare HMO

## 2020-10-19 ENCOUNTER — Ambulatory Visit (HOSPITAL_COMMUNITY): Payer: Medicare HMO | Admitting: Occupational Therapy

## 2020-10-19 ENCOUNTER — Other Ambulatory Visit: Payer: Self-pay

## 2020-10-19 ENCOUNTER — Encounter (HOSPITAL_COMMUNITY): Payer: Self-pay | Admitting: Occupational Therapy

## 2020-10-19 DIAGNOSIS — R29898 Other symptoms and signs involving the musculoskeletal system: Secondary | ICD-10-CM | POA: Diagnosis not present

## 2020-10-19 DIAGNOSIS — M25512 Pain in left shoulder: Secondary | ICD-10-CM | POA: Diagnosis not present

## 2020-10-19 DIAGNOSIS — M25612 Stiffness of left shoulder, not elsewhere classified: Secondary | ICD-10-CM | POA: Diagnosis not present

## 2020-10-19 NOTE — Patient Instructions (Signed)
1) Seated Row   Sit up straight with elbows by your sides. Pull back with shoulders/elbows, keeping forearms straight, as if pulling back on the reins of a horse. Squeeze shoulder blades together. Repeat _10-15__times, __2-3__sets/day    2) Shoulder Extension    Sit up straight with both arms by your side, draw your arms back behind your waist. Keep your elbows straight. Repeat __10-15__times, __2-3__sets/day.

## 2020-10-19 NOTE — Therapy (Signed)
[place], [place], [address] Phone: [phone]   Fax:  [phone]  Occupational Therapy Treatment  Patient Details  Name: Breanna Hill MRN: [id number] Date of Birth: 1940/08/21 Referring Provider (OT): Ophelia Charter, MD   Encounter Date: 10/19/2020   OT End of Session - 10/19/20 0859     Visit Number 2    Number of Visits 24    Date for OT Re-Evaluation 01/06/21   mini reassessment: 11/11/20   Authorization Type 1) A[employer]na Medicare HMO 2) [facility]    Authorization Time Period 1) $35 copay no visit limit 2) $30 copay no authorization needed.    OT Start Time [phone]    OT Stop Time 0855    OT Time Calculation (min) 39 min    Activity Tolerance Patient tolerated treatment well    Behavior During Therapy WFL for tasks assessed/performed             Past Medical History:  Diagnosis Date   Arthritis    R hand    Breast cancer ([place])    Breast disorder    cancer   Cancer ([place])    breast CA- diagnosed with needle biopsy   Depression    GERD (gastroesophageal reflux disease)    rare use of tums    High cholesterol 10/17/2015   History of radiation therapy 03/27/17- 04/23/17   Left Breast, 2.67 Gy in 15 fractions for a total dose of 40.05 Gy. Boost, 2 Gy in 5 fractions for a total dose of 10 Gy.    Hypertension    Hypothyroidism    Pre-diabetes    Hgba1C- in the 6 's , per pt., she monitors her diet    Past Surgical History:  Procedure Laterality Date   BACK SURGERY  1973   BREAST LUMPECTOMY Left 02/2017   BREAST LUMPECTOMY WITH RADIOACTIVE SEED AND SENTINEL LYMPH NODE BIOPSY Left 02/27/2017   Procedure: LEFT BREAST LUMPECTOMY WITH RADIOACTIVE SEED AND LEFT AXILLARY SENTINEL LYMPH NODE BIOPSY;  Surgeon: Donnie Mesa, MD;  Location: [facility];  Service: General;  Laterality: Left;   COLONOSCOPY     X 2   COLONOSCOPY N/A 02/22/2016   Procedure: COLONOSCOPY;  Surgeon: Rogene Houston, MD;   [facility];  Service: Endoscopy;  Laterality: N/A;  1:45   EYE SURGERY Bilateral    phlebpheroplasty   Left shoultder surgery     for bone spurs & frozen shoulder    REVERSE SHOULDER ARTHROPLASTY Left 09/14/2020   Procedure: REVERSE SHOULDER ARTHROPLASTY;  Surgeon: Hiram Gash, MD;  [facility];  Service: Orthopedics;  Laterality: Left;   Right bunionectomy     TONSILLECTOMY     TUBAL LIGATION      There were no vitals filed for this visit.   Subjective Assessment - 10/19/20 0815     Subjective  S: It's a little sore but I took aspirin before I came.    Currently in Pain? Yes    Pain Score 2     Pain Location Shoulder    Pain Orientation Left    Pain Descriptors / Indicators Aching;Sore    Pain Type Acute pain    Pain Radiating Towards None    Pain Onset More than a month ago    Pain Frequency Intermittent    Aggravating Factors  movement    Pain Relieving Factors pain medication    Effect of Pain on Daily Activities  mod effect on ADLs    Multiple Pain Sites No                [facility] OT Assessment - 10/19/20 0815       Assessment   Medical Diagnosis S/P left reverse total shoulder arthroplasty      Precautions   Precautions Shoulder;Other (comment)    Type of Shoulder Precautions see media tab for protocol.    Precaution Comments History of breast cancer and left side mastectomy                      OT Treatments/Exercises (OP) - 10/19/20 0818       Exercises   Exercises Shoulder      Shoulder Exercises: Supine   Protraction PROM;5 reps;AAROM;10 reps    Horizontal ABduction PROM;5 reps;AAROM;10 reps    External Rotation PROM;5 reps;AAROM;10 reps    Internal Rotation PROM;5 reps;AAROM;10 reps    Flexion PROM;5 reps;AAROM;10 reps    ABduction PROM;5 reps      Shoulder Exercises: Standing   Protraction AAROM;10 reps    External Rotation AAROM;10 reps    Internal Rotation AAROM;10 reps    Flexion AAROM;10 reps    ABduction  AAROM;10 reps    Extension AROM;10 reps    Row AROM;10 reps      Shoulder Exercises: Therapy Ball   Flexion 10 reps    ABduction 10 reps      Shoulder Exercises: ROM/Strengthening   Thumb Tacks 1'      Manual Therapy   Manual Therapy Myofascial release    Manual therapy comments completed separately from therapeutic exercises    Myofascial Release myofascial release and manual techniques to left upper arm, trapezius, and scapular regions to decrease pain and fascial restrictions and increase joint ROM                    OT Education - 10/19/20 0844     Education Details scapular A/ROM-row and extension    Person(s) Educated Patient    Methods Explanation;Demonstration;Verbal cues;Handout    Comprehension Verbalized understanding;Returned demonstration              OT Short Term Goals - 10/19/20 0835       OT SHORT TERM GOAL #1   Title Patient will be educated and independent with HEP in order to increase her ability to use her LUE for daily tasks 50% of the time or more.    Time 6    Period Weeks    Status On-going    Target Date 11/25/20      OT SHORT TERM GOAL #2   Title Patient will increase her LUE P/ROM to [facility] in order to increase her ability to complete upper body dressing tasks with less difficulty.    Time 6    Period Weeks    Status On-going      OT SHORT TERM GOAL #3   Title Patient will increase her LUE strength to 3/5 in order to return to using her LUE to fix her hair.    Time 6    Period Weeks    Status On-going      OT SHORT TERM GOAL #4   Title Patient will decrease her LUE fascial restrictions to moderate amount in order to increase the functional mobility needed to complete low level reaching tasks.    Time 6    Period Weeks    Status On-going  OT Long Term Goals - 10/19/20 0835       OT LONG TERM GOAL #1   Title Patient will increase her LUE A/ROM to [facility] in order to be able to complete functional reaching  tasks at or above her shoulder level.    Time 12    Period Weeks    Status On-going      OT LONG TERM GOAL #2   Title Patient will increase her LUE strength to 4/5 to return to lifting household items of moderate weight using her LUE.    Time 12    Period Weeks    Status On-going      OT LONG TERM GOAL #3   Title Pt will report a pain level of approximately 3/10 or less when using her LUE for self care and required daily tasks.    Time 12    Period Weeks    Status On-going      OT LONG TERM GOAL #4   Title Patient will decrease her LUE fascial restrictions to minimal amount or less in order to increase the functional mobility needed to complete all required reaching tasks.    Time 12    Period Weeks    Status On-going                   Plan - 10/19/20 0849     Clinical Impression Statement A: Pt reports she cannot get her contacts in or fix her hair, she has been completing her HEP. Initiated myofascial release to address fascial restrictions, passive stretching with pt achieving approximately 50% ROM for flexion and extension. Initiated AA/ROM in supine and standing, cuing to slow down and allow for end range stretch. Pt completing therapy ball stretching and pulleys. Verbal cuing for form and technique.    Body Structure / Function / Physical Skills ADL;UE functional use;Pain;ROM;Edema;Strength;Fascial restriction    Plan P: Continue with AA/ROM and update for HEP if pt completing with good form. Add pvc pipe slide    OT Home Exercise Plan eval: table slides; 10/19: scapular A/ROM    Consulted and Agree with Plan of Care Patient             Patient will benefit from skilled therapeutic intervention in order to improve the following deficits and impairments:   Body Structure / Function / Physical Skills: ADL, UE functional use, Pain, ROM, Edema, Strength, Fascial restriction       Visit Diagnosis: Other symptoms and signs involving the musculoskeletal  system  Acute pain of left shoulder  Stiffness of left shoulder, not elsewhere classified    Problem List Patient Active Problem List   Diagnosis Date Noted   Malignant neoplasm of upper-inner quadrant of left breast in female, estrogen receptor positive ([place]) 03/05/2017   History of colonic polyps 10/31/2015   High cholesterol 10/17/2015   SHOULDER, ARTHRITIS, DEGEN./OSTEO 02/28/2009   CONTRACTURE OF SHOULDER JOINT 02/16/2009   SHOULDER PAIN 02/16/2009    Guadelupe Sabin, OTR/L  [phone] 10/19/2020, 8:59 AM  [place], [place], [address] Phone: [phone]   Fax:  [phone]  Name: Breanna Hill MRN: [id number] Date of Birth: 06-Feb-1940

## 2020-10-20 ENCOUNTER — Encounter (HOSPITAL_COMMUNITY): Payer: Medicare HMO

## 2020-10-21 ENCOUNTER — Other Ambulatory Visit: Payer: Self-pay

## 2020-10-21 ENCOUNTER — Ambulatory Visit (HOSPITAL_COMMUNITY): Payer: Medicare HMO | Admitting: Occupational Therapy

## 2020-10-21 ENCOUNTER — Encounter (HOSPITAL_COMMUNITY): Payer: Self-pay | Admitting: Occupational Therapy

## 2020-10-21 DIAGNOSIS — R29898 Other symptoms and signs involving the musculoskeletal system: Secondary | ICD-10-CM

## 2020-10-21 DIAGNOSIS — M25612 Stiffness of left shoulder, not elsewhere classified: Secondary | ICD-10-CM

## 2020-10-21 DIAGNOSIS — M25512 Pain in left shoulder: Secondary | ICD-10-CM

## 2020-10-21 NOTE — Therapy (Signed)
[place], [place], [address] Phone: [phone]   Fax:  [phone]  Occupational Therapy Treatment  Patient Details  Name: Breanna Hill MRN: [id number] Date of Birth: [birth year] Referring Provider (OT): Ophelia Charter, MD   Encounter Date: 10/21/2020   OT End of Session - 10/21/20 1430     Visit Number 3    Number of Visits 24    Date for OT Re-Evaluation 01/06/21   mini reassessment: 11/11/20   Authorization Type 1) [employer] Medicare HMO 2) [facility]    Authorization Time Period 1) $35 copay no visit limit 2) $30 copay no authorization needed.    OT Start Time 1346    OT Stop Time 1426    OT Time Calculation (min) 40 min    Activity Tolerance Patient tolerated treatment well    Behavior During Therapy WFL for tasks assessed/performed             Past Medical History:  Diagnosis Date   Arthritis    R hand    Breast cancer ([place])    Breast disorder    cancer   Cancer ([place])    breast CA- diagnosed with needle biopsy   Depression    GERD (gastroesophageal reflux disease)    rare use of tums    High cholesterol 10/17/2015   History of radiation therapy 03/27/17- 04/23/17   Left Breast, 2.67 Gy in 15 fractions for a total dose of 40.05 Gy. Boost, 2 Gy in 5 fractions for a total dose of 10 Gy.    Hypertension    Hypothyroidism    Pre-diabetes    Hgba1C- in the 6 's , per pt., she monitors her diet    Past Surgical History:  Procedure Laterality Date   BACK SURGERY  1973   BREAST LUMPECTOMY Left 02/2017   BREAST LUMPECTOMY WITH RADIOACTIVE SEED AND SENTINEL LYMPH NODE BIOPSY Left 02/27/2017   Procedure: LEFT BREAST LUMPECTOMY WITH RADIOACTIVE SEED AND LEFT AXILLARY SENTINEL LYMPH NODE BIOPSY;  Surgeon: Donnie Mesa, MD;  Location: [facility];  Service: General;  Laterality: Left;   COLONOSCOPY     X 2   COLONOSCOPY N/A 02/22/2016   Procedure: COLONOSCOPY;  Surgeon: Rogene Houston, MD;   [facility];  Service: Endoscopy;  Laterality: N/A;  1:45   EYE SURGERY Bilateral    phlebpheroplasty   Left shoultder surgery     for bone spurs & frozen shoulder    REVERSE SHOULDER ARTHROPLASTY Left 09/14/2020   Procedure: REVERSE SHOULDER ARTHROPLASTY;  Surgeon: Hiram Gash, MD;  [facility];  Service: Orthopedics;  Laterality: Left;   Right bunionectomy     TONSILLECTOMY     TUBAL LIGATION      There were no vitals filed for this visit.   Subjective Assessment - 10/21/20 1349     Subjective  S: I can put my contacts in now.    Currently in Pain? Yes    Pain Score 5     Pain Location Shoulder    Pain Orientation Left    Pain Descriptors / Indicators Aching;Sore    Pain Type Acute pain    Pain Radiating Towards None    Pain Onset More than a month ago    Pain Frequency Intermittent    Aggravating Factors  movement    Pain Relieving Factors pain medication    Effect of Pain on Daily Activities mod effect on ADLs  Multiple Pain Sites No                [facility] OT Assessment - 10/21/20 1349       Assessment   Medical Diagnosis S/P left reverse total shoulder arthroplasty      Precautions   Precautions Shoulder;Other (comment)    Type of Shoulder Precautions see media tab for protocol.    Precaution Comments History of breast cancer and left side mastectomy                      OT Treatments/Exercises (OP) - 10/21/20 1350       Exercises   Exercises Shoulder      Shoulder Exercises: Supine   Protraction PROM;5 reps;AAROM;10 reps    Horizontal ABduction PROM;5 reps;AAROM;10 reps    External Rotation PROM;5 reps;AAROM;10 reps    Internal Rotation PROM;5 reps;AAROM;10 reps    Flexion PROM;5 reps;AAROM;10 reps    ABduction PROM;5 reps      Shoulder Exercises: Standing   Protraction AAROM;10 reps    External Rotation AAROM;10 reps    Internal Rotation AAROM;10 reps    Flexion AAROM;10 reps    ABduction AAROM;10 reps     Extension AROM;10 reps    Row AROM;10 reps    Other Standing Exercises scapular depression, A/ROM, 10X      Shoulder Exercises: Therapy Ball   Flexion 10 reps    ABduction 10 reps      Shoulder Exercises: ROM/Strengthening   Thumb Tacks 1' low level    Prot/Ret//Elev/Dep 1' low level    Other ROM/Strengthening Exercises PVC pipe slide: 10X flexion      Manual Therapy   Manual Therapy Myofascial release    Manual therapy comments completed separately from therapeutic exercises    Myofascial Release myofascial release and manual techniques to left upper arm, trapezius, and scapular regions to decrease pain and fascial restrictions and increase joint ROM                      OT Short Term Goals - 10/19/20 0835       OT SHORT TERM GOAL #1   Title Patient will be educated and independent with HEP in order to increase her ability to use her LUE for daily tasks 50% of the time or more.    Time 6    Period Weeks    Status On-going    Target Date 11/25/20      OT SHORT TERM GOAL #2   Title Patient will increase her LUE P/ROM to [facility] in order to increase her ability to complete upper body dressing tasks with less difficulty.    Time 6    Period Weeks    Status On-going      OT SHORT TERM GOAL #3   Title Patient will increase her LUE strength to 3/5 in order to return to using her LUE to fix her hair.    Time 6    Period Weeks    Status On-going      OT SHORT TERM GOAL #4   Title Patient will decrease her LUE fascial restrictions to moderate amount in order to increase the functional mobility needed to complete low level reaching tasks.    Time 6    Period Weeks    Status On-going               OT Long Term Goals - 10/19/20 [phone]  OT LONG TERM GOAL #1   Title Patient will increase her LUE A/ROM to [facility] in order to be able to complete functional reaching tasks at or above her shoulder level.    Time 12    Period Weeks    Status On-going      OT LONG  TERM GOAL #2   Title Patient will increase her LUE strength to 4/5 to return to lifting household items of moderate weight using her LUE.    Time 12    Period Weeks    Status On-going      OT LONG TERM GOAL #3   Title Pt will report a pain level of approximately 3/10 or less when using her LUE for self care and required daily tasks.    Time 12    Period Weeks    Status On-going      OT LONG TERM GOAL #4   Title Patient will decrease her LUE fascial restrictions to minimal amount or less in order to increase the functional mobility needed to complete all required reaching tasks.    Time 12    Period Weeks    Status On-going                   Plan - 10/21/20 1430     Clinical Impression Statement A: Pt reports she has noticed improvements just in everyday tasks. Continued with myofascial release to address fascial restrictions, passive stretching, and AA/ROM. Focusing on speed and form today during AA/ROM, added prot/ret/elev/dep and pt with mod difficulty with retraction. Also added pvc pipe slide. Verbal cuing for form and technnique.    Body Structure / Function / Physical Skills ADL;UE functional use;Pain;ROM;Edema;Strength;Fascial restriction    Plan P: Update HEP for AA/ROM    OT Home Exercise Plan eval: table slides; 10/19: scapular A/ROM    Consulted and Agree with Plan of Care Patient             Patient will benefit from skilled therapeutic intervention in order to improve the following deficits and impairments:   Body Structure / Function / Physical Skills: ADL, UE functional use, Pain, ROM, Edema, Strength, Fascial restriction       Visit Diagnosis: Other symptoms and signs involving the musculoskeletal system  Acute pain of left shoulder  Stiffness of left shoulder, not elsewhere classified    Problem List Patient Active Problem List   Diagnosis Date Noted   Malignant neoplasm of upper-inner quadrant of left breast in female, estrogen receptor  positive ([place]) 03/05/2017   History of colonic polyps 10/31/2015   High cholesterol 10/17/2015   SHOULDER, ARTHRITIS, DEGEN./OSTEO 02/28/2009   CONTRACTURE OF SHOULDER JOINT 02/16/2009   SHOULDER PAIN 02/16/2009    Guadelupe Sabin, OTR/L  [phone] 10/21/2020, 2:32 PM  [place], [place], [address] Phone: [phone]   Fax:  [phone]  Name: Breanna Hill MRN: [id number] Date of Birth: 09/10/1940

## 2020-10-24 NOTE — Progress Notes (Signed)
History of Present Illness: Breanna Hill is a 80 y.o. year old female presenting for followup of OAB sx's.  She recently fractured her humerus.  She is done well since her surgery.  She is still on trospium and Gemtesa.  Minimal co-pay on the [place].  With both of these, her urinary symptoms are fairly stable.  She does have nocturia x4, but she thinks mainly due to having a sleep disorder.  Past Medical History:  Diagnosis Date   Arthritis    R hand    Breast cancer ([place])    Breast disorder    cancer   Cancer ([place])    breast CA- diagnosed with needle biopsy   Depression    GERD (gastroesophageal reflux disease)    rare use of tums    High cholesterol 10/17/2015   History of radiation therapy 03/27/17- 04/23/17   Left Breast, 2.67 Gy in 15 fractions for a total dose of 40.05 Gy. Boost, 2 Gy in 5 fractions for a total dose of 10 Gy.    Hypertension    Hypothyroidism    Pre-diabetes    Hgba1C- in the 6 's , per pt., she monitors her diet    Past Surgical History:  Procedure Laterality Date   BACK SURGERY  1973   BREAST LUMPECTOMY Left 02/2017   BREAST LUMPECTOMY WITH RADIOACTIVE SEED AND SENTINEL LYMPH NODE BIOPSY Left 02/27/2017   Procedure: LEFT BREAST LUMPECTOMY WITH RADIOACTIVE SEED AND LEFT AXILLARY SENTINEL LYMPH NODE BIOPSY;  Surgeon: Donnie Mesa, MD;  Location: [facility];  Service: General;  Laterality: Left;   COLONOSCOPY     X 2   COLONOSCOPY N/A 02/22/2016   Procedure: COLONOSCOPY;  Surgeon: Rogene Houston, MD;  [facility];  Service: Endoscopy;  Laterality: N/A;  1:45   EYE SURGERY Bilateral    phlebpheroplasty   Left shoultder surgery     for bone spurs & frozen shoulder    REVERSE SHOULDER ARTHROPLASTY Left 09/14/2020   Procedure: REVERSE SHOULDER ARTHROPLASTY;  Surgeon: Hiram Gash, MD;  [facility];  Service: Orthopedics;  Laterality: Left;   Right bunionectomy     TONSILLECTOMY     TUBAL LIGATION      Home Medications:  (Not in a  hospital admission)   Allergies:  Allergies  Allergen Reactions   Penicillins Rash    Has patient had a PCN reaction causing immediate rash, facial/tongue/throat swelling, SOB or lightheadedness with hypotension: No Has patient had a PCN reaction causing severe rash involving mucus membranes or skin necrosis: No Has patient had a PCN reaction that required hospitalization: No Has patient had a PCN reaction occurring within the last 10 years: No If all of the above answers are "NO", then may proceed with Cephalosporin use.     Family History  Problem Relation Age of Onset   Colon cancer Neg Hx     Social History:  reports that she has never smoked. She has never used smokeless tobacco. She reports current alcohol use. She reports that she does not use drugs.  ROS: A complete review of systems was performed.  All systems are negative except for pertinent findings as noted.  Physical Exam:  Vital signs in last 24 hours: @VSRANGES @ General:  Alert and oriented, No acute distress HEENT: Normocephalic, atraumatic Neck: No JVD or lymphadenopathy Cardiovascular: Regular rate  Lungs: Normal inspiratory/expiratory excursion Extremities: 1+ bilateral lower extremity edema Neurologic: Grossly intact  I have reviewed prior pt notes  I  have reviewed urinalysis results    Impression/Assessment:  OAB, fairly stable with dual medical therapy  Plan:  I will see her back on an annual basis  I did let her know that she can take to [facility] on an every other day schedule  Jorja Loa 10/24/2020, 7:37 PM  Lillette Boxer. Redmond Whittley MD

## 2020-10-25 ENCOUNTER — Ambulatory Visit (INDEPENDENT_AMBULATORY_CARE_PROVIDER_SITE_OTHER): Payer: Medicare HMO | Admitting: Urology

## 2020-10-25 ENCOUNTER — Other Ambulatory Visit: Payer: Self-pay

## 2020-10-25 ENCOUNTER — Encounter (HOSPITAL_COMMUNITY): Payer: Self-pay | Admitting: Occupational Therapy

## 2020-10-25 ENCOUNTER — Encounter: Payer: Self-pay | Admitting: Urology

## 2020-10-25 ENCOUNTER — Ambulatory Visit (HOSPITAL_COMMUNITY): Payer: Medicare HMO | Admitting: Occupational Therapy

## 2020-10-25 VITALS — BP 126/72 | HR 96 | Temp 99.1°F | Wt 163.0 lb

## 2020-10-25 DIAGNOSIS — R350 Frequency of micturition: Secondary | ICD-10-CM | POA: Diagnosis not present

## 2020-10-25 DIAGNOSIS — R3915 Urgency of urination: Secondary | ICD-10-CM | POA: Diagnosis not present

## 2020-10-25 DIAGNOSIS — R29898 Other symptoms and signs involving the musculoskeletal system: Secondary | ICD-10-CM

## 2020-10-25 DIAGNOSIS — M25512 Pain in left shoulder: Secondary | ICD-10-CM | POA: Diagnosis not present

## 2020-10-25 DIAGNOSIS — M25612 Stiffness of left shoulder, not elsewhere classified: Secondary | ICD-10-CM

## 2020-10-25 LAB — URINALYSIS, ROUTINE W REFLEX MICROSCOPIC
Bilirubin, UA: NEGATIVE
Glucose, UA: NEGATIVE
Ketones, UA: NEGATIVE
Leukocytes,UA: NEGATIVE
Nitrite, UA: NEGATIVE
Protein,UA: NEGATIVE
RBC, UA: NEGATIVE
Specific Gravity, UA: 1.015 (ref 1.005–1.030)
Urobilinogen, Ur: 0.2 mg/dL (ref 0.2–1.0)
pH, UA: 6 (ref 5.0–7.5)

## 2020-10-25 NOTE — Patient Instructions (Signed)

## 2020-10-25 NOTE — Progress Notes (Signed)
Urological Symptom Review  Patient is experiencing the following symptoms: Frequent urination Get up at night to urinate   Review of Systems  Gastrointestinal (upper)  : Negative for upper GI symptoms  Gastrointestinal (lower) : Constipation  Constitutional : Fatigue  Skin: Negative for skin symptoms  Eyes: Negative for eye symptoms  Ear/Nose/Throat : Negative for Ear/Nose/Throat symptoms  Hematologic/Lymphatic: Negative for Hematologic/Lymphatic symptoms  Cardiovascular : Negative for cardiovascular symptoms  Respiratory : Cough  Endocrine: Negative for endocrine symptoms  Musculoskeletal: Negative for musculoskeletal symptoms  Neurological: Negative for neurological symptoms  Psychologic: Depression Anxiety

## 2020-10-25 NOTE — Therapy (Signed)
[place], [place], [address] Phone: [phone]   Fax:  [phone]  Occupational Therapy Treatment  Patient Details  Name: Breanna Hill MRN: [id number] Date of Birth: 19-Jun-1940 Referring Provider (OT): Ophelia Charter, MD   Encounter Date: 10/25/2020   OT End of Session - 10/25/20 1417     Visit Number 4    Number of Visits 24    Date for OT Re-Evaluation 01/06/21   mini reassessment: 11/11/20   Authorization Type 1) [employer]na Medicare HMO 2) [place]    Authorization Time Period 1) $35 copay no visit limit 2) $30 copay no authorization needed.    OT Start Time 1117    OT Stop Time 1200    OT Time Calculation (min) 43 min    Activity Tolerance Patient tolerated treatment well    Behavior During Therapy WFL for tasks assessed/performed             Past Medical History:  Diagnosis Date   Arthritis    R hand    Breast cancer ([place])    Breast disorder    cancer   Cancer ([place])    breast CA- diagnosed with needle biopsy   Depression    GERD (gastroesophageal reflux disease)    rare use of tums    High cholesterol 10/17/2015   History of radiation therapy 03/27/17- 04/23/17   Left Breast, 2.67 Gy in 15 fractions for a total dose of 40.05 Gy. Boost, 2 Gy in 5 fractions for a total dose of 10 Gy.    Hypertension    Hypothyroidism    Pre-diabetes    Hgba1C- in the 6 's , per pt., she monitors her diet    Past Surgical History:  Procedure Laterality Date   BACK SURGERY  1973   BREAST LUMPECTOMY Left 02/2017   BREAST LUMPECTOMY WITH RADIOACTIVE SEED AND SENTINEL LYMPH NODE BIOPSY Left 02/27/2017   Procedure: LEFT BREAST LUMPECTOMY WITH RADIOACTIVE SEED AND LEFT AXILLARY SENTINEL LYMPH NODE BIOPSY;  Surgeon: Donnie Mesa, MD;  Location: [facility];  Service: General;  Laterality: Left;   COLONOSCOPY     X 2   COLONOSCOPY N/A 02/22/2016   Procedure: COLONOSCOPY;  Surgeon: Rogene Houston, MD;   [facility];  Service: Endoscopy;  Laterality: N/A;  1:45   EYE SURGERY Bilateral    phlebpheroplasty   Left shoultder surgery     for bone spurs & frozen shoulder    REVERSE SHOULDER ARTHROPLASTY Left 09/14/2020   Procedure: REVERSE SHOULDER ARTHROPLASTY;  Surgeon: Hiram Gash, MD;  [facility];  Service: Orthopedics;  Laterality: Left;   Right bunionectomy     TONSILLECTOMY     TUBAL LIGATION      There were no vitals filed for this visit.   Subjective Assessment - 10/25/20 1117     Subjective  S: I have a big knot in the L shoulder.    Currently in Pain? Yes    Pain Score 8     Pain Location Shoulder    Pain Orientation Left    Pain Descriptors / Indicators Aching;Sharp;Sore    Pain Type Acute pain    Pain Radiating Towards none    Pain Onset More than a month ago    Pain Frequency Intermittent    Aggravating Factors  movement    Pain Relieving Factors pain medications    Effect of Pain on Daily Activities mod effect  on ADLs    Multiple Pain Sites No                [facility] OT Assessment - 10/25/20 0001       Assessment   Medical Diagnosis S/P left reverse total shoulder arthroplasty      Precautions   Precautions Shoulder;Other (comment)    Type of Shoulder Precautions see media tab for protocol.                      OT Treatments/Exercises (OP) - 10/25/20 0001       Exercises   Exercises Shoulder      Shoulder Exercises: Supine   Protraction PROM;5 reps;AAROM;10 reps    Horizontal ABduction PROM;5 reps;AAROM;10 reps    External Rotation PROM;5 reps;AAROM;10 reps    Internal Rotation PROM;5 reps;AAROM;10 reps    Flexion PROM;5 reps;AAROM;10 reps    ABduction PROM;5 reps;AAROM;10 reps      Shoulder Exercises: Standing   Protraction AAROM;10 reps    External Rotation AAROM;10 reps    Internal Rotation AAROM;10 reps    Flexion AAROM;10 reps    ABduction AAROM;10 reps      Manual Therapy   Manual Therapy Myofascial  release    Manual therapy comments completed separately from therapeutic exercises    Myofascial Release myofascial release and manual techniques to left upper arm, trapezius, and scapular regions to decrease pain and fascial restrictions and increase joint ROM                    OT Education - 10/25/20 1417     Education Details Shoulder A/AROM    Person(s) Educated Patient    Methods Explanation;Demonstration;Verbal cues;Handout    Comprehension Verbalized understanding;Returned demonstration              OT Short Term Goals - 10/19/20 0835       OT SHORT TERM GOAL #1   Title Patient will be educated and independent with HEP in order to increase her ability to use her LUE for daily tasks 50% of the time or more.    Time 6    Period Weeks    Status On-going    Target Date 11/25/20      OT SHORT TERM GOAL #2   Title Patient will increase her LUE P/ROM to [facility] in order to increase her ability to complete upper body dressing tasks with less difficulty.    Time 6    Period Weeks    Status On-going      OT SHORT TERM GOAL #3   Title Patient will increase her LUE strength to 3/5 in order to return to using her LUE to fix her hair.    Time 6    Period Weeks    Status On-going      OT SHORT TERM GOAL #4   Title Patient will decrease her LUE fascial restrictions to moderate amount in order to increase the functional mobility needed to complete low level reaching tasks.    Time 6    Period Weeks    Status On-going               OT Long Term Goals - 10/19/20 0835       OT LONG TERM GOAL #1   Title Patient will increase her LUE A/ROM to [facility] in order to be able to complete functional reaching tasks at or above her shoulder level.    Time 12  Period Weeks    Status On-going      OT LONG TERM GOAL #2   Title Patient will increase her LUE strength to 4/5 to return to lifting household items of moderate weight using her LUE.    Time 12    Period Weeks     Status On-going      OT LONG TERM GOAL #3   Title Pt will report a pain level of approximately 3/10 or less when using her LUE for self care and required daily tasks.    Time 12    Period Weeks    Status On-going      OT LONG TERM GOAL #4   Title Patient will decrease her LUE fascial restrictions to minimal amount or less in order to increase the functional mobility needed to complete all required reaching tasks.    Time 12    Period Weeks    Status On-going                   Plan - 10/25/20 1420     Clinical Impression Statement A: Pt reports increased pain today at 8/10. Continued myofascial release to address fascial restrictions mostly in upper trapezius region. Passive stretching and AA/ROM completed with focusing on technique and education using handout for HEP. Pt often compensating with full body movement needing cuing. Pt educated to try exercises in front of a mirror when possible.    Body Structure / Function / Physical Skills ADL;UE functional use;Pain;ROM;Edema;Strength;Fascial restriction    Plan P: Follow up with pt on use of AA/ROM HEP at home this past week; Continue myofascial, P/ROM, and A/AROM.    OT Home Exercise Plan eval: table slides; 10/19: scapular A/ROM; 10/25: A/AROM shoulder    Consulted and Agree with Plan of Care Patient             Patient will benefit from skilled therapeutic intervention in order to improve the following deficits and impairments:   Body Structure / Function / Physical Skills: ADL, UE functional use, Pain, ROM, Edema, Strength, Fascial restriction       Visit Diagnosis: Other symptoms and signs involving the musculoskeletal system  Acute pain of left shoulder  Stiffness of left shoulder, not elsewhere classified    Problem List Patient Active Problem List   Diagnosis Date Noted   Malignant neoplasm of upper-inner quadrant of left breast in female, estrogen receptor positive ([place]) 03/05/2017   History of  colonic polyps 10/31/2015   High cholesterol 10/17/2015   SHOULDER, ARTHRITIS, DEGEN./OSTEO 02/28/2009   CONTRACTURE OF SHOULDER JOINT 02/16/2009   SHOULDER PAIN 02/16/2009   Larey Seat OT, MOT  Larey Seat, OT/L 10/25/2020, 2:24 PM  [place] [address] [place], [place], [address] Phone: [phone]   Fax:  [phone]  Name: INZA MIKRUT MRN: [id number] Date of Birth: [birth year]

## 2020-10-27 ENCOUNTER — Encounter (HOSPITAL_COMMUNITY): Payer: Medicare HMO

## 2020-10-28 ENCOUNTER — Other Ambulatory Visit: Payer: Self-pay

## 2020-10-28 ENCOUNTER — Other Ambulatory Visit: Payer: Self-pay | Admitting: Hematology and Oncology

## 2020-10-28 ENCOUNTER — Encounter (HOSPITAL_COMMUNITY): Payer: Self-pay

## 2020-10-28 ENCOUNTER — Ambulatory Visit (HOSPITAL_COMMUNITY): Payer: Medicare HMO

## 2020-10-28 DIAGNOSIS — M25612 Stiffness of left shoulder, not elsewhere classified: Secondary | ICD-10-CM | POA: Diagnosis not present

## 2020-10-28 DIAGNOSIS — M25512 Pain in left shoulder: Secondary | ICD-10-CM | POA: Diagnosis not present

## 2020-10-28 DIAGNOSIS — R29898 Other symptoms and signs involving the musculoskeletal system: Secondary | ICD-10-CM | POA: Diagnosis not present

## 2020-10-28 DIAGNOSIS — Z9889 Other specified postprocedural states: Secondary | ICD-10-CM

## 2020-10-28 NOTE — Therapy (Signed)
[place], [place], [address] Phone: [phone]   Fax:  [phone]  Occupational Therapy Treatment  Patient Details  Name: Breanna Hill MRN: [id number] Date of Birth: 13-Mar-1940 Referring Provider (OT): Ophelia Charter, MD   Encounter Date: 10/28/2020   OT End of Session - 10/28/20 0841     Visit Number 5    Number of Visits 24    Date for OT Re-Evaluation 01/06/21   mini reassessment: 11/11/20   Authorization Type 1) Aetna Medicare HMO 2) [place]    Authorization Time Period 1) $35 copay no visit limit 2) $30 copay no authorization needed.    OT Start Time 0815    OT Stop Time 0853    OT Time Calculation (min) 38 min    Activity Tolerance Patient tolerated treatment well    Behavior During Therapy WFL for tasks assessed/performed             Past Medical History:  Diagnosis Date   Arthritis    R hand    Breast cancer ([place])    Breast disorder    cancer   Cancer ([place])    breast CA- diagnosed with needle biopsy   Depression    GERD (gastroesophageal reflux disease)    rare use of tums    High cholesterol 10/17/2015   History of radiation therapy 03/27/17- 04/23/17   Left Breast, 2.67 Gy in 15 fractions for a total dose of 40.05 Gy. Boost, 2 Gy in 5 fractions for a total dose of 10 Gy.    Hypertension    Hypothyroidism    Pre-diabetes    Hgba1C- in the 6 's , per pt., she monitors her diet    Past Surgical History:  Procedure Laterality Date   BACK SURGERY  1973   BREAST LUMPECTOMY Left 02/2017   BREAST LUMPECTOMY WITH RADIOACTIVE SEED AND SENTINEL LYMPH NODE BIOPSY Left 02/27/2017   Procedure: LEFT BREAST LUMPECTOMY WITH RADIOACTIVE SEED AND LEFT AXILLARY SENTINEL LYMPH NODE BIOPSY;  Surgeon: Donnie Mesa, MD;  Location: [facility];  Service: General;  Laterality: Left;   COLONOSCOPY     X 2   COLONOSCOPY N/A 02/22/2016   Procedure: COLONOSCOPY;  Surgeon: Rogene Houston, MD;   [facility];  Service: Endoscopy;  Laterality: N/A;  1:45   EYE SURGERY Bilateral    phlebpheroplasty   Left shoultder surgery     for bone spurs & frozen shoulder    REVERSE SHOULDER ARTHROPLASTY Left 09/14/2020   Procedure: REVERSE SHOULDER ARTHROPLASTY;  Surgeon: Hiram Gash, MD;  [facility];  Service: Orthopedics;  Laterality: Left;   Right bunionectomy     TONSILLECTOMY     TUBAL LIGATION      There were no vitals filed for this visit.   Subjective Assessment - 10/28/20 0821     Subjective  S: I have some questions about my exercises.    Currently in Pain? Yes    Pain Score 4     Pain Location Shoulder    Pain Orientation Left    Pain Descriptors / Indicators Sore;Aching;Sharp    Pain Type Surgical pain    Pain Onset More than a month ago    Pain Frequency Intermittent    Aggravating Factors  movement    Pain Relieving Factors pain medications    Effect of Pain on Daily Activities moderate effect on ADLs    Multiple Pain Sites No  [facility] OT Assessment - 10/28/20 0839       Assessment   Medical Diagnosis S/P left reverse total shoulder arthroplasty      Precautions   Precautions Shoulder;Other (comment)    Type of Shoulder Precautions see media tab for protocol.    Precaution Comments History of breast cancer and left side mastectomy                      OT Treatments/Exercises (OP) - 10/28/20 0839       Exercises   Exercises Shoulder      Shoulder Exercises: Supine   Protraction PROM;5 reps;AAROM;10 reps    Horizontal ABduction PROM;5 reps;AAROM;10 reps    External Rotation PROM;5 reps;AAROM;10 reps    Internal Rotation PROM;5 reps;AAROM;10 reps    Flexion PROM;5 reps;AAROM;10 reps    ABduction PROM;5 reps;AAROM;10 reps      Shoulder Exercises: Standing   Protraction AAROM;10 reps    External Rotation --    Internal Rotation --    Flexion AAROM;10 reps    ABduction --      Manual Therapy   Manual  Therapy Myofascial release    Manual therapy comments completed separately from therapeutic exercises    Myofascial Release myofascial release and manual techniques to left upper arm, trapezius, and scapular regions to decrease pain and fascial restrictions and increase joint ROM                      OT Short Term Goals - 10/19/20 0835       OT SHORT TERM GOAL #1   Title Patient will be educated and independent with HEP in order to increase her ability to use her LUE for daily tasks 50% of the time or more.    Time 6    Period Weeks    Status On-going    Target Date 11/25/20      OT SHORT TERM GOAL #2   Title Patient will increase her LUE P/ROM to [facility] in order to increase her ability to complete upper body dressing tasks with less difficulty.    Time 6    Period Weeks    Status On-going      OT SHORT TERM GOAL #3   Title Patient will increase her LUE strength to 3/5 in order to return to using her LUE to fix her hair.    Time 6    Period Weeks    Status On-going      OT SHORT TERM GOAL #4   Title Patient will decrease her LUE fascial restrictions to moderate amount in order to increase the functional mobility needed to complete low level reaching tasks.    Time 6    Period Weeks    Status On-going               OT Long Term Goals - 10/19/20 0835       OT LONG TERM GOAL #1   Title Patient will increase her LUE A/ROM to [facility] in order to be able to complete functional reaching tasks at or above her shoulder level.    Time 12    Period Weeks    Status On-going      OT LONG TERM GOAL #2   Title Patient will increase her LUE strength to 4/5 to return to lifting household items of moderate weight using her LUE.    Time 12    Period Weeks    Status  On-going      OT LONG TERM GOAL #3   Title Pt will report a pain level of approximately 3/10 or less when using her LUE for self care and required daily tasks.    Time 12    Period Weeks    Status On-going       OT LONG TERM GOAL #4   Title Patient will decrease her LUE fascial restrictions to minimal amount or less in order to increase the functional mobility needed to complete all required reaching tasks.    Time 12    Period Weeks    Status On-going                   Plan - 10/28/20 0856     Clinical Impression Statement A: Myofascial release completed to left upper arm and upper trapezius to address moderate fascial restrictions. P/ROM is limited by pain although able to achieve approximately 50% of range for passive flexin and abduction. external rotation is limited while achieving just near 0 degrees. Recommended that patient complete her AA/ROM HEP supine for now as she is experiencing increased difficulty when standing. VC were provided during session for form and technique.    Body Structure / Function / Physical Skills ADL;UE functional use;Pain;ROM;Edema;Strength;Fascial restriction    Plan P: Continue with AA/ROM. Pulleys. PVC pipe slide.    Consulted and Agree with Plan of Care Patient             Patient will benefit from skilled therapeutic intervention in order to improve the following deficits and impairments:   Body Structure / Function / Physical Skills: ADL, UE functional use, Pain, ROM, Edema, Strength, Fascial restriction       Visit Diagnosis: Other symptoms and signs involving the musculoskeletal system  Acute pain of left shoulder  Stiffness of left shoulder, not elsewhere classified    Problem List Patient Active Problem List   Diagnosis Date Noted   Malignant neoplasm of upper-inner quadrant of left breast in female, estrogen receptor positive ([place]) 03/05/2017   History of colonic polyps 10/31/2015   High cholesterol 10/17/2015   SHOULDER, ARTHRITIS, DEGEN./OSTEO 02/28/2009   CONTRACTURE OF SHOULDER JOINT 02/16/2009   SHOULDER PAIN 02/16/2009   Ailene Ravel, OTR/L,CBIS  [phone]  10/28/2020, 8:59 AM  [place], [place], [address] Phone: [phone]   Fax:  [phone]  Name: Breanna Hill MRN: [id number] Date of Birth: 1940/01/09

## 2020-10-31 ENCOUNTER — Ambulatory Visit (HOSPITAL_COMMUNITY): Payer: Medicare HMO

## 2020-11-02 ENCOUNTER — Ambulatory Visit
Admission: RE | Admit: 2020-11-02 | Discharge: 2020-11-02 | Disposition: A | Discharge status: Home / Self Care | Payer: Medicare HMO | Source: Ambulatory Visit | Attending: Hematology and Oncology | Admitting: Hematology and Oncology

## 2020-11-02 ENCOUNTER — Encounter (HOSPITAL_COMMUNITY): Payer: Self-pay | Admitting: Occupational Therapy

## 2020-11-02 ENCOUNTER — Ambulatory Visit (HOSPITAL_COMMUNITY): Payer: Medicare HMO | Attending: Orthopaedic Surgery | Admitting: Occupational Therapy

## 2020-11-02 ENCOUNTER — Other Ambulatory Visit: Payer: Self-pay

## 2020-11-02 DIAGNOSIS — M25612 Stiffness of left shoulder, not elsewhere classified: Secondary | ICD-10-CM | POA: Insufficient documentation

## 2020-11-02 DIAGNOSIS — Z9889 Other specified postprocedural states: Secondary | ICD-10-CM

## 2020-11-02 DIAGNOSIS — M25512 Pain in left shoulder: Secondary | ICD-10-CM | POA: Diagnosis not present

## 2020-11-02 DIAGNOSIS — R29898 Other symptoms and signs involving the musculoskeletal system: Secondary | ICD-10-CM | POA: Insufficient documentation

## 2020-11-02 DIAGNOSIS — R922 Inconclusive mammogram: Secondary | ICD-10-CM | POA: Diagnosis not present

## 2020-11-02 NOTE — Therapy (Signed)
[place], [place], [address] Phone: [phone]   Fax:  [phone]  Occupational Therapy Treatment  Patient Details  Name: Breanna Hill MRN: [id number] Date of Birth: 09-May-1940 Referring Provider (OT): Ophelia Charter, MD   Encounter Date: 11/02/2020   OT End of Session - 11/02/20 0814     Visit Number 6    Number of Visits 24    Date for OT Re-Evaluation 01/06/21   mini reassessment: 11/11/20   Authorization Type 1) Aetna Medicare HMO 2) [place]    Authorization Time Period 1) $35 copay no visit limit 2) $30 copay no authorization needed.    OT Start Time [phone]   pt arrived late   OT Stop Time 0813    OT Time Calculation (min) 32 min    Activity Tolerance Patient tolerated treatment well    Behavior During Therapy WFL for tasks assessed/performed             Past Medical History:  Diagnosis Date   Arthritis    R hand    Breast cancer ([place])    Breast disorder    cancer   Cancer ([place])    breast CA- diagnosed with needle biopsy   Depression    GERD (gastroesophageal reflux disease)    rare use of tums    High cholesterol 10/17/2015   History of radiation therapy 03/27/17- 04/23/17   Left Breast, 2.67 Gy in 15 fractions for a total dose of 40.05 Gy. Boost, 2 Gy in 5 fractions for a total dose of 10 Gy.    Hypertension    Hypothyroidism    Pre-diabetes    Hgba1C- in the 6 's , per pt., she monitors her diet    Past Surgical History:  Procedure Laterality Date   BACK SURGERY  1973   BREAST LUMPECTOMY Left 02/2017   BREAST LUMPECTOMY WITH RADIOACTIVE SEED AND SENTINEL LYMPH NODE BIOPSY Left 02/27/2017   Procedure: LEFT BREAST LUMPECTOMY WITH RADIOACTIVE SEED AND LEFT AXILLARY SENTINEL LYMPH NODE BIOPSY;  Surgeon: Donnie Mesa, MD;  Location: [facility];  Service: General;  Laterality: Left;   COLONOSCOPY     X 2   COLONOSCOPY N/A 02/22/2016   Procedure: COLONOSCOPY;  Surgeon: Rogene Houston, MD;  [facility];  Service: Endoscopy;  Laterality: N/A;  1:45   EYE SURGERY Bilateral    phlebpheroplasty   Left shoultder surgery     for bone spurs & frozen shoulder    REVERSE SHOULDER ARTHROPLASTY Left 09/14/2020   Procedure: REVERSE SHOULDER ARTHROPLASTY;  Surgeon: Hiram Gash, MD;  [facility];  Service: Orthopedics;  Laterality: Left;   Right bunionectomy     TONSILLECTOMY     TUBAL LIGATION      There were no vitals filed for this visit.   Subjective Assessment - 11/02/20 0742     Subjective  S: I had a really rough night.    Currently in Pain? Yes    Pain Score 8     Pain Location Shoulder    Pain Orientation Left    Pain Descriptors / Indicators Aching;Sore    Pain Type Acute pain    Pain Radiating Towards None    Pain Onset More than a month ago    Pain Frequency Constant    Aggravating Factors  movement    Pain Relieving Factors pain medication    Effect of Pain on Daily Activities mod  effect on ADLs    Multiple Pain Sites No                [facility] OT Assessment - 11/02/20 0743       Assessment   Medical Diagnosis S/P left reverse total shoulder arthroplasty      Precautions   Precautions Shoulder;Other (comment)    Type of Shoulder Precautions see media tab for protocol.    Precaution Comments History of breast cancer and left side mastectomy                      OT Treatments/Exercises (OP) - 11/02/20 0754       Exercises   Exercises Shoulder      Shoulder Exercises: Supine   Protraction PROM;5 reps;AAROM;10 reps    Horizontal ABduction PROM;5 reps;AAROM;10 reps    External Rotation PROM;5 reps;AAROM;10 reps    Internal Rotation PROM;5 reps;AAROM;10 reps    Flexion PROM;5 reps;AAROM;10 reps    ABduction PROM;5 reps;AAROM;10 reps      Shoulder Exercises: Pulleys   Flexion 1 minute    ABduction 1 minute      Shoulder Exercises: ROM/Strengthening   Other ROM/Strengthening Exercises PVC pipe slide:  10X flexion      Manual Therapy   Manual Therapy Myofascial release    Manual therapy comments completed separately from therapeutic exercises    Myofascial Release myofascial release and manual techniques to left upper arm, trapezius, and scapular regions to decrease pain and fascial restrictions and increase joint ROM                      OT Short Term Goals - 10/19/20 0835       OT SHORT TERM GOAL #1   Title Patient will be educated and independent with HEP in order to increase her ability to use her LUE for daily tasks 50% of the time or more.    Time 6    Period Weeks    Status On-going    Target Date 11/25/20      OT SHORT TERM GOAL #2   Title Patient will increase her LUE P/ROM to [facility] in order to increase her ability to complete upper body dressing tasks with less difficulty.    Time 6    Period Weeks    Status On-going      OT SHORT TERM GOAL #3   Title Patient will increase her LUE strength to 3/5 in order to return to using her LUE to fix her hair.    Time 6    Period Weeks    Status On-going      OT SHORT TERM GOAL #4   Title Patient will decrease her LUE fascial restrictions to moderate amount in order to increase the functional mobility needed to complete low level reaching tasks.    Time 6    Period Weeks    Status On-going               OT Long Term Goals - 10/19/20 0835       OT LONG TERM GOAL #1   Title Patient will increase her LUE A/ROM to [facility] in order to be able to complete functional reaching tasks at or above her shoulder level.    Time 12    Period Weeks    Status On-going      OT LONG TERM GOAL #2   Title Patient will increase her LUE strength to 4/5  to return to lifting household items of moderate weight using her LUE.    Time 12    Period Weeks    Status On-going      OT LONG TERM GOAL #3   Title Pt will report a pain level of approximately 3/10 or less when using her LUE for self care and required daily tasks.    Time  12    Period Weeks    Status On-going      OT LONG TERM GOAL #4   Title Patient will decrease her LUE fascial restrictions to minimal amount or less in order to increase the functional mobility needed to complete all required reaching tasks.    Time 12    Period Weeks    Status On-going                   Plan - 11/02/20 0758     Clinical Impression Statement A: Pt reports she pushed a shopping cart and vacuumed over the past few days and is having increased pain and difficulty with HEP. Session focusing on pain management and stretching, myofascial release completed to LUE to address fascial restrictions and tenderness. Passive stretching completed with increased guarding initially however was able to relax and allow for greater ROM with cuing. Completed AA/ROM in supine and clarified exercises for pt's HEP. Completed PVC pipe slide and pulleys. Educated pt on pain management for home over the next few days. Verbal cuing for form and technique during exercises.    Body Structure / Function / Physical Skills ADL;UE functional use;Pain;ROM;Edema;Strength;Fascial restriction    Plan P: Follow up on pain, continue with AA/ROM progressing to standing as able    OT Home Exercise Plan eval: table slides; 10/19: scapular A/ROM; 10/25: A/AROM shoulder    Consulted and Agree with Plan of Care Patient             Patient will benefit from skilled therapeutic intervention in order to improve the following deficits and impairments:   Body Structure / Function / Physical Skills: ADL, UE functional use, Pain, ROM, Edema, Strength, Fascial restriction       Visit Diagnosis: Other symptoms and signs involving the musculoskeletal system  Acute pain of left shoulder  Stiffness of left shoulder, not elsewhere classified    Problem List Patient Active Problem List   Diagnosis Date Noted   Malignant neoplasm of upper-inner quadrant of left breast in female, estrogen receptor positive  ([place]) 03/05/2017   History of colonic polyps 10/31/2015   High cholesterol 10/17/2015   SHOULDER, ARTHRITIS, DEGEN./OSTEO 02/28/2009   CONTRACTURE OF SHOULDER JOINT 02/16/2009   SHOULDER PAIN 02/16/2009    Guadelupe Sabin, OTR/L  [phone] 11/02/2020, 8:15 AM  [place], [place], [address] Phone: [phone]   Fax:  [phone]  Name: ZYAIRA VEJAR MRN: [id number] Date of Birth: September 14, 1940

## 2020-11-04 ENCOUNTER — Other Ambulatory Visit: Payer: Self-pay

## 2020-11-04 ENCOUNTER — Ambulatory Visit (HOSPITAL_COMMUNITY): Payer: Medicare HMO | Admitting: Occupational Therapy

## 2020-11-04 DIAGNOSIS — M25512 Pain in left shoulder: Secondary | ICD-10-CM | POA: Diagnosis not present

## 2020-11-04 DIAGNOSIS — R29898 Other symptoms and signs involving the musculoskeletal system: Secondary | ICD-10-CM | POA: Diagnosis not present

## 2020-11-04 DIAGNOSIS — M25612 Stiffness of left shoulder, not elsewhere classified: Secondary | ICD-10-CM

## 2020-11-04 NOTE — Therapy (Signed)
[place], [place], [address] Phone: [phone]   Fax:  [phone]  Occupational Therapy Treatment  Patient Details  Name: Breanna Hill MRN: [id number] Date of Birth: 1940-06-29 Referring Provider (OT): Ophelia Charter, MD   Encounter Date: 11/04/2020   OT End of Session - 11/04/20 1028     Visit Number 7    Number of Visits 24    Date for OT Re-Evaluation 01/06/21   mini reassessment: 11/11/20   Authorization Type 1) [employer] Medicare HMO 2) [employer]    Authorization Time Period 1) $35 copay no visit limit 2) $30 copay no authorization needed.    OT Start Time [phone]    OT Stop Time 1028    OT Time Calculation (min) 38 min    Activity Tolerance Patient tolerated treatment well    Behavior During Therapy WFL for tasks assessed/performed             Past Medical History:  Diagnosis Date   Arthritis    R hand    Breast cancer ([place])    Breast disorder    cancer   Cancer ([place])    breast CA- diagnosed with needle biopsy   Depression    GERD (gastroesophageal reflux disease)    rare use of tums    High cholesterol 10/17/2015   History of radiation therapy 03/27/17- 04/23/17   Left Breast, 2.67 Gy in 15 fractions for a total dose of 40.05 Gy. Boost, 2 Gy in 5 fractions for a total dose of 10 Gy.    Hypertension    Hypothyroidism    Pre-diabetes    Hgba1C- in the 6 's , per pt., she monitors her diet    Past Surgical History:  Procedure Laterality Date   BACK SURGERY  1973   BREAST LUMPECTOMY Left 02/2017   BREAST LUMPECTOMY WITH RADIOACTIVE SEED AND SENTINEL LYMPH NODE BIOPSY Left 02/27/2017   Procedure: LEFT BREAST LUMPECTOMY WITH RADIOACTIVE SEED AND LEFT AXILLARY SENTINEL LYMPH NODE BIOPSY;  Surgeon: Donnie Mesa, MD;  Location: [facility];  Service: General;  Laterality: Left;   COLONOSCOPY     X 2   COLONOSCOPY N/A 02/22/2016   Procedure: COLONOSCOPY;  Surgeon: Rogene Houston, MD;   [facility];  Service: Endoscopy;  Laterality: N/A;  1:45   EYE SURGERY Bilateral    phlebpheroplasty   Left shoultder surgery     for bone spurs & frozen shoulder    REVERSE SHOULDER ARTHROPLASTY Left 09/14/2020   Procedure: REVERSE SHOULDER ARTHROPLASTY;  Surgeon: Hiram Gash, MD;  [facility];  Service: Orthopedics;  Laterality: Left;   Right bunionectomy     TONSILLECTOMY     TUBAL LIGATION      There were no vitals filed for this visit.   Subjective Assessment - 11/04/20 0952     Subjective  S: That place on my back isn't bothering me as much.    Currently in Pain? Yes    Pain Score 4     Pain Location Shoulder    Pain Orientation Left    Pain Descriptors / Indicators Aching;Sore    Pain Type Acute pain    Pain Radiating Towards None    Pain Onset More than a month ago    Pain Frequency Constant    Aggravating Factors  movement    Pain Relieving Factors pain medication    Effect of Pain on Daily Activities mod  effect on ADLs    Multiple Pain Sites No                [facility] OT Assessment - 11/04/20 0952       Assessment   Medical Diagnosis S/P left reverse total shoulder arthroplasty      Precautions   Precautions Shoulder;Other (comment)    Type of Shoulder Precautions see media tab for protocol.    Precaution Comments History of breast cancer and left side mastectomy                      OT Treatments/Exercises (OP) - 11/04/20 0953       Exercises   Exercises Shoulder      Shoulder Exercises: Supine   Protraction PROM;5 reps;AAROM;12 reps    Horizontal ABduction PROM;5 reps;AAROM;12 reps    External Rotation PROM;5 reps;AAROM;12 reps    Internal Rotation PROM;5 reps;AAROM;12 reps    Flexion PROM;5 reps;AAROM;12 reps    ABduction PROM;5 reps;AAROM;12 reps      Shoulder Exercises: Standing   Protraction AROM;12 reps    External Rotation AAROM;12 reps    Internal Rotation AAROM;12 reps    Flexion AAROM;12 reps     ABduction AAROM;12 reps      Shoulder Exercises: Pulleys   Flexion 1 minute    ABduction 1 minute      Shoulder Exercises: ROM/Strengthening   Other ROM/Strengthening Exercises PVC pipe slide: 10X flexion      Manual Therapy   Manual Therapy Myofascial release    Manual therapy comments completed separately from therapeutic exercises    Myofascial Release myofascial release and manual techniques to left upper arm, trapezius, and scapular regions to decrease pain and fascial restrictions and increase joint ROM                      OT Short Term Goals - 10/19/20 0835       OT SHORT TERM GOAL #1   Title Patient will be educated and independent with HEP in order to increase her ability to use her LUE for daily tasks 50% of the time or more.    Time 6    Period Weeks    Status On-going    Target Date 11/25/20      OT SHORT TERM GOAL #2   Title Patient will increase her LUE P/ROM to [facility] in order to increase her ability to complete upper body dressing tasks with less difficulty.    Time 6    Period Weeks    Status On-going      OT SHORT TERM GOAL #3   Title Patient will increase her LUE strength to 3/5 in order to return to using her LUE to fix her hair.    Time 6    Period Weeks    Status On-going      OT SHORT TERM GOAL #4   Title Patient will decrease her LUE fascial restrictions to moderate amount in order to increase the functional mobility needed to complete low level reaching tasks.    Time 6    Period Weeks    Status On-going               OT Long Term Goals - 10/19/20 0835       OT LONG TERM GOAL #1   Title Patient will increase her LUE A/ROM to [facility] in order to be able to complete functional reaching tasks at or above  her shoulder level.    Time 12    Period Weeks    Status On-going      OT LONG TERM GOAL #2   Title Patient will increase her LUE strength to 4/5 to return to lifting household items of moderate weight using her LUE.    Time 12     Period Weeks    Status On-going      OT LONG TERM GOAL #3   Title Pt will report a pain level of approximately 3/10 or less when using her LUE for self care and required daily tasks.    Time 12    Period Weeks    Status On-going      OT LONG TERM GOAL #4   Title Patient will decrease her LUE fascial restrictions to minimal amount or less in order to increase the functional mobility needed to complete all required reaching tasks.    Time 12    Period Weeks    Status On-going                   Plan - 11/04/20 1011     Clinical Impression Statement A: Pt reports improvement in pain since previous session, has been completing her HEP in standing at home with success. Continued with myofascial release to address fascial restrictions, passive stretching. Pt completing AA/ROM in supine and standing, verbal cuing for speed and form. Attempted wall wash however pt unable to complete due to pain. Continued with pulleys. Verbal cuing for form and technique.    Body Structure / Function / Physical Skills ADL;UE functional use;Pain;ROM;Edema;Strength;Fascial restriction    Plan P: Continued with protocol, working to increase available ROM    OT Home Exercise Plan eval: table slides; 10/19: scapular A/ROM; 10/25: A/AROM shoulder    Consulted and Agree with Plan of Care Patient             Patient will benefit from skilled therapeutic intervention in order to improve the following deficits and impairments:   Body Structure / Function / Physical Skills: ADL, UE functional use, Pain, ROM, Edema, Strength, Fascial restriction       Visit Diagnosis: Other symptoms and signs involving the musculoskeletal system  Acute pain of left shoulder  Stiffness of left shoulder, not elsewhere classified    Problem List Patient Active Problem List   Diagnosis Date Noted   Malignant neoplasm of upper-inner quadrant of left breast in female, estrogen receptor positive ([place]) 03/05/2017    History of colonic polyps 10/31/2015   High cholesterol 10/17/2015   SHOULDER, ARTHRITIS, DEGEN./OSTEO 02/28/2009   CONTRACTURE OF SHOULDER JOINT 02/16/2009   SHOULDER PAIN 02/16/2009    Guadelupe Sabin, OTR/L  [phone] 11/04/2020, 10:30 AM  [place], [place], [address] Phone: [phone]   Fax:  [phone]  Name: Breanna Hill MRN: [id number] Date of Birth: [birth year]

## 2020-11-07 ENCOUNTER — Encounter (HOSPITAL_COMMUNITY): Payer: Medicare HMO

## 2020-11-07 DIAGNOSIS — M1712 Unilateral primary osteoarthritis, left knee: Secondary | ICD-10-CM | POA: Diagnosis not present

## 2020-11-08 ENCOUNTER — Encounter (HOSPITAL_COMMUNITY): Payer: Self-pay

## 2020-11-08 ENCOUNTER — Ambulatory Visit (HOSPITAL_COMMUNITY): Payer: Medicare HMO

## 2020-11-08 ENCOUNTER — Other Ambulatory Visit: Payer: Self-pay

## 2020-11-08 DIAGNOSIS — R29898 Other symptoms and signs involving the musculoskeletal system: Secondary | ICD-10-CM | POA: Diagnosis not present

## 2020-11-08 DIAGNOSIS — M25512 Pain in left shoulder: Secondary | ICD-10-CM

## 2020-11-08 DIAGNOSIS — M25612 Stiffness of left shoulder, not elsewhere classified: Secondary | ICD-10-CM

## 2020-11-08 NOTE — Therapy (Signed)
[place], [place], [address] Phone: [phone]   Fax:  [phone]  Occupational Therapy Treatment  Patient Details  Name: Breanna Hill MRN: [id number] Date of Birth: 25-Feb-1940 Referring Provider (OT): Ophelia Charter, MD   Encounter Date: 11/08/2020   OT End of Session - 11/08/20 0939     Visit Number 8    Number of Visits 24    Date for OT Re-Evaluation 01/06/21   mini reassessment: 11/11/20   Authorization Type 1) [employer] Medicare HMO 2) [place]    Authorization Time Period 1) $35 copay no visit limit 2) $30 copay no authorization needed.    OT Start Time 0900    OT Stop Time [phone]    OT Time Calculation (min) 38 min    Activity Tolerance Patient tolerated treatment well    Behavior During Therapy WFL for tasks assessed/performed             Past Medical History:  Diagnosis Date   Arthritis    R hand    Breast cancer ([place])    Breast disorder    cancer   Cancer ([place])    breast CA- diagnosed with needle biopsy   Depression    GERD (gastroesophageal reflux disease)    rare use of tums    High cholesterol 10/17/2015   History of radiation therapy 03/27/17- 04/23/17   Left Breast, 2.67 Gy in 15 fractions for a total dose of 40.05 Gy. Boost, 2 Gy in 5 fractions for a total dose of 10 Gy.    Hypertension    Hypothyroidism    Pre-diabetes    Hgba1C- in the 6 's , per pt., she monitors her diet    Past Surgical History:  Procedure Laterality Date   BACK SURGERY  1973   BREAST LUMPECTOMY Left 02/2017   BREAST LUMPECTOMY WITH RADIOACTIVE SEED AND SENTINEL LYMPH NODE BIOPSY Left 02/27/2017   Procedure: LEFT BREAST LUMPECTOMY WITH RADIOACTIVE SEED AND LEFT AXILLARY SENTINEL LYMPH NODE BIOPSY;  Surgeon: Donnie Mesa, MD;  Location: [place];  Service: General;  Laterality: Left;   COLONOSCOPY     X 2   COLONOSCOPY N/A 02/22/2016   Procedure: COLONOSCOPY;  Surgeon: Rogene Houston, MD;   [facility];  Service: Endoscopy;  Laterality: N/A;  1:45   EYE SURGERY Bilateral    phlebpheroplasty   Left shoultder surgery     for bone spurs & frozen shoulder    REVERSE SHOULDER ARTHROPLASTY Left 09/14/2020   Procedure: REVERSE SHOULDER ARTHROPLASTY;  Surgeon: Hiram Gash, MD;  [facility];  Service: Orthopedics;  Laterality: Left;   Right bunionectomy     TONSILLECTOMY     TUBAL LIGATION      There were no vitals filed for this visit.   Subjective Assessment - 11/08/20 0924     Subjective  S: I have been doing my exercises with the stick while standing in front of a window so I can see my reflection.    Currently in Pain? Yes    Pain Score 4     Pain Location Shoulder    Pain Orientation Left    Pain Descriptors / Indicators Aching;Sore    Pain Type Acute pain    Pain Onset More than a month ago    Pain Frequency Constant    Aggravating Factors  movement    Pain Relieving Factors pain medication    Effect of  Pain on Daily Activities moderate effect    Multiple Pain Sites No                [facility] OT Assessment - 11/08/20 0927       Assessment   Medical Diagnosis S/P left reverse total shoulder arthroplasty      Precautions   Precautions Shoulder;Other (comment)    Type of Shoulder Precautions see media tab for protocol.    Precaution Comments History of breast cancer and left side mastectomy                      OT Treatments/Exercises (OP) - 11/08/20 0927       Exercises   Exercises Shoulder      Shoulder Exercises: Supine   Protraction PROM;5 reps;AAROM;12 reps    Horizontal ABduction PROM;5 reps;AAROM;12 reps    External Rotation PROM;5 reps;AAROM;12 reps    Internal Rotation PROM;5 reps;AAROM;12 reps    Flexion PROM;5 reps;AAROM;12 reps    ABduction PROM;5 reps;AAROM;12 reps      Shoulder Exercises: Standing   Protraction AAROM;12 reps    Horizontal ABduction AAROM;12 reps    External Rotation AAROM;12 reps     Internal Rotation AAROM;12 reps    Flexion AAROM;12 reps    ABduction AAROM;12 reps      Shoulder Exercises: Pulleys   Flexion 1 minute   standing   ABduction 1 minute   standing     Manual Therapy   Manual Therapy Myofascial release    Manual therapy comments completed separately from therapeutic exercises    Myofascial Release myofascial release and manual techniques to left upper arm, trapezius, and scapular regions to decrease pain and fascial restrictions and increase joint ROM                      OT Short Term Goals - 10/19/20 0835       OT SHORT TERM GOAL #1   Title Patient will be educated and independent with HEP in order to increase her ability to use her LUE for daily tasks 50% of the time or more.    Time 6    Period Weeks    Status On-going    Target Date 11/25/20      OT SHORT TERM GOAL #2   Title Patient will increase her LUE P/ROM to [facility] in order to increase her ability to complete upper body dressing tasks with less difficulty.    Time 6    Period Weeks    Status On-going      OT SHORT TERM GOAL #3   Title Patient will increase her LUE strength to 3/5 in order to return to using her LUE to fix her hair.    Time 6    Period Weeks    Status On-going      OT SHORT TERM GOAL #4   Title Patient will decrease her LUE fascial restrictions to moderate amount in order to increase the functional mobility needed to complete low level reaching tasks.    Time 6    Period Weeks    Status On-going               OT Long Term Goals - 10/19/20 0835       OT LONG TERM GOAL #1   Title Patient will increase her LUE A/ROM to [facility] in order to be able to complete functional reaching tasks at or above her shoulder level.  Time 12    Period Weeks    Status On-going      OT LONG TERM GOAL #2   Title Patient will increase her LUE strength to 4/5 to return to lifting household items of moderate weight using her LUE.    Time 12    Period Weeks    Status  On-going      OT LONG TERM GOAL #3   Title Pt will report a pain level of approximately 3/10 or less when using her LUE for self care and required daily tasks.    Time 12    Period Weeks    Status On-going      OT LONG TERM GOAL #4   Title Patient will decrease her LUE fascial restrictions to minimal amount or less in order to increase the functional mobility needed to complete all required reaching tasks.    Time 12    Period Weeks    Status On-going                   Plan - 11/08/20 2706     Clinical Impression Statement A: Reviewed HEP standing to ensure proper form and technique. Verbal cues were provided for form and technique. Manual techniques completed to address moderate fascial restrictions in the left upper arm and upper arm trapezius region. Pain continues to be a limiting factor for progressing ROM.    Body Structure / Function / Physical Skills ADL;UE functional use;Pain;ROM;Edema;Strength;Fascial restriction    Plan P: mini reassessment. FOTO.    Consulted and Agree with Plan of Care Patient             Patient will benefit from skilled therapeutic intervention in order to improve the following deficits and impairments:   Body Structure / Function / Physical Skills: ADL, UE functional use, Pain, ROM, Edema, Strength, Fascial restriction       Visit Diagnosis: Other symptoms and signs involving the musculoskeletal system  Acute pain of left shoulder  Stiffness of left shoulder, not elsewhere classified    Problem List Patient Active Problem List   Diagnosis Date Noted   Malignant neoplasm of upper-inner quadrant of left breast in female, estrogen receptor positive ([place]) 03/05/2017   History of colonic polyps 10/31/2015   High cholesterol 10/17/2015   SHOULDER, ARTHRITIS, DEGEN./OSTEO 02/28/2009   CONTRACTURE OF SHOULDER JOINT 02/16/2009   SHOULDER PAIN 02/16/2009    Ailene Ravel, OTR/L,CBIS  [phone]  11/08/2020, 9:45 AM  [place], [place], [address] Phone: [phone]   Fax:  [phone]  Name: LILLYANA MAJETTE MRN: [id number] Date of Birth: 07/12/1940

## 2020-11-09 ENCOUNTER — Ambulatory Visit (HOSPITAL_COMMUNITY): Payer: Medicare HMO

## 2020-11-09 ENCOUNTER — Encounter (HOSPITAL_COMMUNITY): Payer: Self-pay

## 2020-11-09 DIAGNOSIS — R29898 Other symptoms and signs involving the musculoskeletal system: Secondary | ICD-10-CM | POA: Diagnosis not present

## 2020-11-09 DIAGNOSIS — M25612 Stiffness of left shoulder, not elsewhere classified: Secondary | ICD-10-CM

## 2020-11-09 DIAGNOSIS — M25512 Pain in left shoulder: Secondary | ICD-10-CM

## 2020-11-09 NOTE — Therapy (Signed)
[facility] [address] [place], [place], [address] Phone: [phone]   Fax:  [phone]  Occupational Therapy Treatment  Patient Details  Name: Breanna Hill MRN: [id number] Date of Birth: 15-Jan-1940 Referring Provider (OT): Ophelia Charter, MD  Progress Note Reporting Period 10/14/20 to 11/09/20  See note below for Objective Data and Assessment of Progress/Goals.      Encounter Date: 11/09/2020   OT End of Session - 11/09/20 1328     Visit Number 9    Number of Visits 24    Date for OT Re-Evaluation 01/06/21   mini reassess: 12/07/20   Authorization Type 1) [employer] Medicare HMO 2) [facility]    Authorization Time Period 1) $35 copay no visit limit 2) $30 copay no authorization needed.    Progress Note Due on Visit 78    OT Start Time 0945    OT Stop Time 1023    OT Time Calculation (min) 38 min    Activity Tolerance Patient tolerated treatment well    Behavior During Therapy WFL for tasks assessed/performed             Past Medical History:  Diagnosis Date   Arthritis    R hand    Breast cancer ([place])    Breast disorder    cancer   Cancer ([place])    breast CA- diagnosed with needle biopsy   Depression    GERD (gastroesophageal reflux disease)    rare use of tums    High cholesterol 10/17/2015   History of radiation therapy 03/27/17- 04/23/17   Left Breast, 2.67 Gy in 15 fractions for a total dose of 40.05 Gy. Boost, 2 Gy in 5 fractions for a total dose of 10 Gy.    Hypertension    Hypothyroidism    Pre-diabetes    Hgba1C- in the 6 's , per pt., she monitors her diet    Past Surgical History:  Procedure Laterality Date   BACK SURGERY  1973   BREAST LUMPECTOMY Left 02/2017   BREAST LUMPECTOMY WITH RADIOACTIVE SEED AND SENTINEL LYMPH NODE BIOPSY Left 02/27/2017   Procedure: LEFT BREAST LUMPECTOMY WITH RADIOACTIVE SEED AND LEFT AXILLARY SENTINEL LYMPH NODE BIOPSY;  Surgeon: Donnie Mesa, MD;  Location: [facility];  Service: General;  Laterality: Left;   COLONOSCOPY     X 2   COLONOSCOPY N/A 02/22/2016   Procedure: COLONOSCOPY;  Surgeon: Rogene Houston, MD;  [facility];  Service: Endoscopy;  Laterality: N/A;  1:45   EYE SURGERY Bilateral    phlebpheroplasty   Left shoultder surgery     for bone spurs & frozen shoulder    REVERSE SHOULDER ARTHROPLASTY Left 09/14/2020   Procedure: REVERSE SHOULDER ARTHROPLASTY;  Surgeon: Hiram Gash, MD;  Location: [facility];  Service: Orthopedics;  Laterality: Left;   Right bunionectomy     TONSILLECTOMY     TUBAL LIGATION      There were no vitals filed for this visit.   Subjective Assessment - 11/09/20 0948     Subjective  S: I still cannot do my hair. I can carry my grocery bags though.    Currently in Pain? Yes    Pain Score 2     Pain Location Shoulder    Pain Orientation Left    Pain Descriptors / Indicators Aching;Sore    Pain Type Acute pain    Pain Onset More than a month ago    Pain  Frequency Constant    Aggravating Factors  movement    Pain Relieving Factors pain medication    Effect of Pain on Daily Activities moderate effect                [facility] OT Assessment - 11/09/20 0949       Assessment   Medical Diagnosis S/P left reverse total shoulder arthroplasty      Precautions   Precautions Shoulder;Other (comment)    Type of Shoulder Precautions see media tab for protocol.    Precaution Comments History of breast cancer and left side mastectomy      Observation/Other Assessments   Focus on Therapeutic Outcomes (FOTO)  52/100      ROM / Strength   AROM / PROM / Strength AROM;PROM;Strength      Palpation   Palpation comment Moderate fascial restrictions noted in the left upper arm region.      AROM   Overall AROM Comments Assessed standing. IR/er adducted    AROM Assessment Site Shoulder    Right/Left Shoulder Left    Left Shoulder Flexion 85 Degrees   previous: 72   Left Shoulder ABduction 80 Degrees    previous: 65   Left Shoulder Internal Rotation 90 Degrees   previous: same   Left Shoulder External Rotation 0 Degrees   previous: -17     PROM   Overall PROM Comments Assessed supine. IR/er adducted    PROM Assessment Site Shoulder    Right/Left Shoulder Left    Left Shoulder Flexion 103 Degrees   previous: 99   Left Shoulder ABduction 125 Degrees   previous: 80   Left Shoulder Internal Rotation 90 Degrees   previous: same   Left Shoulder External Rotation 24 Degrees   previous: -15     Strength   Overall Strength Comments Assessed standing. IR/er adducted. Strength not assessed prior to this date.    Strength Assessment Site Shoulder    Right/Left Shoulder Left    Left Shoulder Flexion 3-/5    Left Shoulder ABduction 3-/5    Left Shoulder Internal Rotation 3/5    Left Shoulder External Rotation 3-/5                      OT Treatments/Exercises (OP) - 11/09/20 1011       Exercises   Exercises Shoulder      Shoulder Exercises: Standing   Protraction AAROM;12 reps    Flexion AAROM;12 reps      Shoulder Exercises: ROM/Strengthening   Other ROM/Strengthening Exercises PVC pipe slide: 12X flexion    Other ROM/Strengthening Exercises Behind the head/External rotation; saebo ball; right/left 5X each      Manual Therapy   Manual Therapy Myofascial release    Manual therapy comments completed separately from therapeutic exercises    Myofascial Release myofascial release and manual techniques to left upper arm, trapezius, and scapular regions to decrease pain and fascial restrictions and increase joint ROM                      OT Short Term Goals - 11/09/20 1008       OT SHORT TERM GOAL #1   Title Patient will be educated and independent with HEP in order to increase her ability to use her LUE for daily tasks 50% of the time or more.    Time 6    Period Weeks    Status Achieved    Target Date 11/25/20  OT SHORT TERM GOAL #2   Title Patient  will increase her LUE P/ROM to [facility] in order to increase her ability to complete upper body dressing tasks with less difficulty.    Time 6    Period Weeks    Status On-going      OT SHORT TERM GOAL #3   Title Patient will increase her LUE strength to 3/5 in order to return to using her LUE to fix her hair.    Time 6    Period Weeks    Status On-going      OT SHORT TERM GOAL #4   Title Patient will decrease her LUE fascial restrictions to moderate amount in order to increase the functional mobility needed to complete low level reaching tasks.    Time 6    Period Weeks    Status Achieved               OT Long Term Goals - 10/19/20 0835       OT LONG TERM GOAL #1   Title Patient will increase her LUE A/ROM to [facility] in order to be able to complete functional reaching tasks at or above her shoulder level.    Time 12    Period Weeks    Status On-going      OT LONG TERM GOAL #2   Title Patient will increase her LUE strength to 4/5 to return to lifting household items of moderate weight using her LUE.    Time 12    Period Weeks    Status On-going      OT LONG TERM GOAL #3   Title Pt will report a pain level of approximately 3/10 or less when using her LUE for self care and required daily tasks.    Time 12    Period Weeks    Status On-going      OT LONG TERM GOAL #4   Title Patient will decrease her LUE fascial restrictions to minimal amount or less in order to increase the functional mobility needed to complete all required reaching tasks.    Time 12    Period Weeks    Status On-going                   Plan - 11/09/20 1329     Clinical Impression Statement A: 10th visit progress note completed this date. Pt has met 2/4 STGs. She reports that she is able to put her contacts in without propping her left elbow up on the counter, she can open her front door, carry groceries (resonable weight), and set the table. She continues to be unable to fix her hair with her left  UE. Passive and Active ROM has increased. Continues to demonstrates deficits with ROM and strength. Pain limiting patient from tolerating anything but gentle passive ROM. VC for form and technique were provided during session. Myofascial release completed to address minimal fascial restrictions in the left upper arm.    Body Structure / Function / Physical Skills ADL;UE functional use;Pain;ROM;Edema;Strength;Fascial restriction    Plan P: Continue follow protocol. Progress ROM as able to tolerate.    Consulted and Agree with Plan of Care Patient             Patient will benefit from skilled therapeutic intervention in order to improve the following deficits and impairments:   Body Structure / Function / Physical Skills: ADL, UE functional use, Pain, ROM, Edema, Strength, Fascial restriction  Visit Diagnosis: Other symptoms and signs involving the musculoskeletal system  Acute pain of left shoulder  Stiffness of left shoulder, not elsewhere classified    Problem List Patient Active Problem List   Diagnosis Date Noted   Malignant neoplasm of upper-inner quadrant of left breast in female, estrogen receptor positive ([place]) 03/05/2017   History of colonic polyps 10/31/2015   High cholesterol 10/17/2015   SHOULDER, ARTHRITIS, DEGEN./OSTEO 02/28/2009   CONTRACTURE OF SHOULDER JOINT 02/16/2009   SHOULDER PAIN 02/16/2009    Ailene Ravel, OTR/L,CBIS  [phone]  11/09/2020, 1:33 PM  [facility] [place], [place], [address] Phone: [phone]   Fax:  [phone]  Name: Breanna Hill MRN: [id number] Date of Birth: 1940/08/24

## 2020-11-14 ENCOUNTER — Other Ambulatory Visit: Payer: Self-pay

## 2020-11-14 ENCOUNTER — Ambulatory Visit (HOSPITAL_COMMUNITY): Payer: Medicare HMO

## 2020-11-14 ENCOUNTER — Encounter (HOSPITAL_COMMUNITY): Payer: Self-pay

## 2020-11-14 DIAGNOSIS — M25512 Pain in left shoulder: Secondary | ICD-10-CM

## 2020-11-14 DIAGNOSIS — R29898 Other symptoms and signs involving the musculoskeletal system: Secondary | ICD-10-CM | POA: Diagnosis not present

## 2020-11-14 DIAGNOSIS — M25612 Stiffness of left shoulder, not elsewhere classified: Secondary | ICD-10-CM

## 2020-11-14 NOTE — Therapy (Signed)
[facility] [place], [place], [address] Phone: [phone]   Fax:  [phone]  Occupational Therapy Treatment  Patient Details  Name: Breanna Hill MRN: [id number] Date of Birth: October 23, 1940 Referring Provider (OT): Ophelia Charter, MD   Encounter Date: 11/14/2020   OT End of Session - 11/14/20 1123     Visit Number 10    Number of Visits 24    Date for OT Re-Evaluation 01/06/21   mini reassess: 12/07/20   Authorization Type 1) [employer] Medicare HMO 2) [facility]    Authorization Time Period 1) $35 copay no visit limit 2) $30 copay no authorization needed.    Progress Note Due on Visit 19    OT Start Time 1046   Pt checked in late   OT Stop Time 1119    OT Time Calculation (min) 33 min    Activity Tolerance Patient tolerated treatment well    Behavior During Therapy WFL for tasks assessed/performed             Past Medical History:  Diagnosis Date   Arthritis    R hand    Breast cancer ([place])    Breast disorder    cancer   Cancer ([place])    breast CA- diagnosed with needle biopsy   Depression    GERD (gastroesophageal reflux disease)    rare use of tums    High cholesterol 10/17/2015   History of radiation therapy 03/27/17- 04/23/17   Left Breast, 2.67 Gy in 15 fractions for a total dose of 40.05 Gy. Boost, 2 Gy in 5 fractions for a total dose of 10 Gy.    Hypertension    Hypothyroidism    Pre-diabetes    Hgba1C- in the 6 's , per pt., she monitors her diet    Past Surgical History:  Procedure Laterality Date   BACK SURGERY  1973   BREAST LUMPECTOMY Left 02/2017   BREAST LUMPECTOMY WITH RADIOACTIVE SEED AND SENTINEL LYMPH NODE BIOPSY Left 02/27/2017   Procedure: LEFT BREAST LUMPECTOMY WITH RADIOACTIVE SEED AND LEFT AXILLARY SENTINEL LYMPH NODE BIOPSY;  Surgeon: Donnie Mesa, MD;  Location: [facility];  Service: General;  Laterality: Left;   COLONOSCOPY     X 2   COLONOSCOPY N/A 02/22/2016   Procedure:  COLONOSCOPY;  Surgeon: Rogene Houston, MD;  [facility];  Service: Endoscopy;  Laterality: N/A;  1:45   EYE SURGERY Bilateral    phlebpheroplasty   Left shoultder surgery     for bone spurs & frozen shoulder    REVERSE SHOULDER ARTHROPLASTY Left 09/14/2020   Procedure: REVERSE SHOULDER ARTHROPLASTY;  Surgeon: Hiram Gash, MD;  [facility];  Service: Orthopedics;  Laterality: Left;   Right bunionectomy     TONSILLECTOMY     TUBAL LIGATION      There were no vitals filed for this visit.   Subjective Assessment - 11/14/20 1100     Subjective  S: I didn't do my exercises this weekend. My arm was in too much pain for 3 days.    Currently in Pain? Yes    Pain Score 6     Pain Location Shoulder    Pain Orientation Left    Pain Descriptors / Indicators Aching;Sore    Pain Type Acute pain    Pain Onset In the past 7 days    Pain Frequency Constant    Aggravating Factors  exercises and movement  Pain Relieving Factors heat and OTC pain medication    Effect of Pain on Daily Activities moderate effect    Multiple Pain Sites No                [facility] OT Assessment - 11/14/20 1102       Assessment   Medical Diagnosis S/P left reverse total shoulder arthroplasty      Precautions   Precautions Shoulder;Other (comment)    Type of Shoulder Precautions see media tab for protocol.    Precaution Comments History of breast cancer and left side mastectomy                      OT Treatments/Exercises (OP) - 11/14/20 1102       Exercises   Exercises Shoulder      Shoulder Exercises: Supine   Protraction PROM;5 reps;AAROM;12 reps    Horizontal ABduction PROM;5 reps;AAROM;12 reps    External Rotation PROM;5 reps;AAROM;12 reps    Internal Rotation PROM;5 reps;AAROM;12 reps    Flexion PROM;5 reps;AAROM;12 reps    ABduction PROM;5 reps;AAROM;12 reps      Shoulder Exercises: Standing   Protraction AAROM;12 reps    External Rotation AAROM;12 reps     Internal Rotation AAROM;12 reps    Flexion AAROM;12 reps    ABduction AAROM;12 reps      Shoulder Exercises: Pulleys   Flexion 1 minute   standing   ABduction 1 minute   standing     Shoulder Exercises: ROM/Strengthening   Other ROM/Strengthening Exercises PVC pipe slide: 10X flexion      Manual Therapy   Manual Therapy Myofascial release    Manual therapy comments completed separately from therapeutic exercises    Myofascial Release myofascial release and manual techniques to left upper arm, trapezius, and scapular regions to decrease pain and fascial restrictions and increase joint ROM                      OT Short Term Goals - 11/14/20 1132       OT SHORT TERM GOAL #1   Title Patient will be educated and independent with HEP in order to increase her ability to use her LUE for daily tasks 50% of the time or more.    Time 6    Period Weeks    Target Date 11/25/20      OT SHORT TERM GOAL #2   Title Patient will increase her LUE P/ROM to [facility] in order to increase her ability to complete upper body dressing tasks with less difficulty.    Time 6    Period Weeks    Status On-going      OT SHORT TERM GOAL #3   Title Patient will increase her LUE strength to 3/5 in order to return to using her LUE to fix her hair.    Time 6    Period Weeks    Status On-going      OT SHORT TERM GOAL #4   Title Patient will decrease her LUE fascial restrictions to moderate amount in order to increase the functional mobility needed to complete low level reaching tasks.    Time 6    Period Weeks               OT Long Term Goals - 10/19/20 0835       OT LONG TERM GOAL #1   Title Patient will increase her LUE A/ROM to [facility] in order to  be able to complete functional reaching tasks at or above her shoulder level.    Time 12    Period Weeks    Status On-going      OT LONG TERM GOAL #2   Title Patient will increase her LUE strength to 4/5 to return to lifting household items of  moderate weight using her LUE.    Time 12    Period Weeks    Status On-going      OT LONG TERM GOAL #3   Title Pt will report a pain level of approximately 3/10 or less when using her LUE for self care and required daily tasks.    Time 12    Period Weeks    Status On-going      OT LONG TERM GOAL #4   Title Patient will decrease her LUE fascial restrictions to minimal amount or less in order to increase the functional mobility needed to complete all required reaching tasks.    Time 12    Period Weeks    Status On-going                   Plan - 11/14/20 1124     Clinical Impression Statement A: Pt arrives with report of a high level of pain for 3 straight days last week. She presents with a pain score of 6/10 with increased tenderness noted during myofascial release. Completed gentle myofascial release to address minimal fascial restrictions. Minimal pressure applied. ROM has not decreased since last week. Continues to be limited by pain level during exercises. VC for form and technique were provided during session.    Body Structure / Function / Physical Skills ADL;UE functional use;Pain;ROM;Edema;Strength;Fascial restriction    Plan P: Follow up on pain from previous session. Continue to follow protocol. Progress ROM as pain level allows. Add wall wash if able to tolerate. Work on [employer] when completing AA/ROM.    Consulted and Agree with Plan of Care Patient             Patient will benefit from skilled therapeutic intervention in order to improve the following deficits and impairments:   Body Structure / Function / Physical Skills: ADL, UE functional use, Pain, ROM, Edema, Strength, Fascial restriction       Visit Diagnosis: Other symptoms and signs involving the musculoskeletal system  Acute pain of left shoulder  Stiffness of left shoulder, not elsewhere classified    Problem List Patient Active Problem List   Diagnosis Date Noted    Malignant neoplasm of upper-inner quadrant of left breast in female, estrogen receptor positive ([place]) 03/05/2017   History of colonic polyps 10/31/2015   High cholesterol 10/17/2015   SHOULDER, ARTHRITIS, DEGEN./OSTEO 02/28/2009   CONTRACTURE OF SHOULDER JOINT 02/16/2009   SHOULDER PAIN 02/16/2009    Ailene Ravel, OTR/L,CBIS  [phone]  11/14/2020, 11:33 AM  [facility] [place], [place], [address] Phone: [phone]   Fax:  [phone]  Name: Breanna Hill MRN: [id number] Date of Birth: Nov 09, 1940

## 2020-11-16 ENCOUNTER — Encounter (HOSPITAL_COMMUNITY): Payer: Self-pay

## 2020-11-16 ENCOUNTER — Other Ambulatory Visit: Payer: Self-pay

## 2020-11-16 ENCOUNTER — Ambulatory Visit (HOSPITAL_COMMUNITY): Payer: Medicare HMO

## 2020-11-16 DIAGNOSIS — M25612 Stiffness of left shoulder, not elsewhere classified: Secondary | ICD-10-CM

## 2020-11-16 DIAGNOSIS — R29898 Other symptoms and signs involving the musculoskeletal system: Secondary | ICD-10-CM | POA: Diagnosis not present

## 2020-11-16 DIAGNOSIS — M25512 Pain in left shoulder: Secondary | ICD-10-CM | POA: Diagnosis not present

## 2020-11-16 NOTE — Therapy (Signed)
[place], [place], [address] Phone: [phone]   Fax:  [phone]  Occupational Therapy Treatment  Patient Details  Name: Breanna Hill MRN: [id number] Date of Birth: [birth year] Referring Provider (OT): Ophelia Charter, MD   Encounter Date: 11/16/2020   OT End of Session - 11/16/20 1114     Visit Number 11    Number of Visits 24    Date for OT Re-Evaluation 01/06/21   mini reassess: 12/07/20   Authorization Type 1) [employer] Medicare HMO 2) [place]    Authorization Time Period 1) $35 copay no visit limit 2) $30 copay no authorization needed.    Progress Note Due on Visit 19    OT Start Time 1030    OT Stop Time 1108    OT Time Calculation (min) 38 min    Activity Tolerance Patient tolerated treatment well    Behavior During Therapy WFL for tasks assessed/performed             Past Medical History:  Diagnosis Date   Arthritis    R hand    Breast cancer ([place])    Breast disorder    cancer   Cancer ([place])    breast CA- diagnosed with needle biopsy   Depression    GERD (gastroesophageal reflux disease)    rare use of tums    High cholesterol 10/17/2015   History of radiation therapy 03/27/17- 04/23/17   Left Breast, 2.67 Gy in 15 fractions for a total dose of 40.05 Gy. Boost, 2 Gy in 5 fractions for a total dose of 10 Gy.    Hypertension    Hypothyroidism    Pre-diabetes    Hgba1C- in the 6 's , per pt., she monitors her diet    Past Surgical History:  Procedure Laterality Date   BACK SURGERY  1973   BREAST LUMPECTOMY Left 02/2017   BREAST LUMPECTOMY WITH RADIOACTIVE SEED AND SENTINEL LYMPH NODE BIOPSY Left 02/27/2017   Procedure: LEFT BREAST LUMPECTOMY WITH RADIOACTIVE SEED AND LEFT AXILLARY SENTINEL LYMPH NODE BIOPSY;  Surgeon: Donnie Mesa, MD;  Location: [facility];  Service: General;  Laterality: Left;   COLONOSCOPY     X 2   COLONOSCOPY N/A 02/22/2016   Procedure: COLONOSCOPY;   Surgeon: Rogene Houston, MD;  [facility];  Service: Endoscopy;  Laterality: N/A;  1:45   EYE SURGERY Bilateral    phlebpheroplasty   Left shoultder surgery     for bone spurs & frozen shoulder    REVERSE SHOULDER ARTHROPLASTY Left 09/14/2020   Procedure: REVERSE SHOULDER ARTHROPLASTY;  Surgeon: Hiram Gash, MD;  [facility];  Service: Orthopedics;  Laterality: Left;   Right bunionectomy     TONSILLECTOMY     TUBAL LIGATION      There were no vitals filed for this visit.   Subjective Assessment - 11/16/20 1056     Subjective  S: It's better than it was the other day.    Currently in Pain? Yes    Pain Score 3     Pain Location Shoulder    Pain Orientation Left    Pain Descriptors / Indicators Aching;Sore    Pain Type Acute pain    Pain Onset In the past 7 days    Pain Frequency Constant    Aggravating Factors  exercises and movement    Pain Relieving Factors heat and OTC pain medication    Effect of  Pain on Daily Activities moderate effect    Multiple Pain Sites No                          OT Treatments/Exercises (OP) - 11/16/20 1059       Exercises   Exercises Shoulder      Shoulder Exercises: Supine   Protraction PROM;5 reps    Horizontal ABduction PROM;5 reps    External Rotation PROM;5 reps    Internal Rotation PROM;5 reps    Flexion PROM;5 reps    ABduction PROM;5 reps      Shoulder Exercises: Standing   Protraction AAROM;12 reps    External Rotation AAROM;12 reps    Internal Rotation AAROM;12 reps    Flexion AAROM;12 reps    ABduction AAROM;12 reps      Shoulder Exercises: ROM/Strengthening   Wall Wash 1' low level      Manual Therapy   Manual Therapy Myofascial release;Muscle Energy Technique    Manual therapy comments completed separately from therapeutic exercises    Myofascial Release myofascial release and manual techniques to left upper arm, trapezius, and scapular regions to decrease pain and fascial  restrictions and increase joint ROM    Muscle Energy Technique Muscle energy technique completed to left shoulder flexors to relax tone and improve ROM.                      OT Short Term Goals - 11/14/20 1132       OT SHORT TERM GOAL #1   Title Patient will be educated and independent with HEP in order to increase her ability to use her LUE for daily tasks 50% of the time or more.    Time 6    Period Weeks    Target Date 11/25/20      OT SHORT TERM GOAL #2   Title Patient will increase her LUE P/ROM to [facility] in order to increase her ability to complete upper body dressing tasks with less difficulty.    Time 6    Period Weeks    Status On-going      OT SHORT TERM GOAL #3   Title Patient will increase her LUE strength to 3/5 in order to return to using her LUE to fix her hair.    Time 6    Period Weeks    Status On-going      OT SHORT TERM GOAL #4   Title Patient will decrease her LUE fascial restrictions to moderate amount in order to increase the functional mobility needed to complete low level reaching tasks.    Time 6    Period Weeks               OT Long Term Goals - 10/19/20 0835       OT LONG TERM GOAL #1   Title Patient will increase her LUE A/ROM to [facility] in order to be able to complete functional reaching tasks at or above her shoulder level.    Time 12    Period Weeks    Status On-going      OT LONG TERM GOAL #2   Title Patient will increase her LUE strength to 4/5 to return to lifting household items of moderate weight using her LUE.    Time 12    Period Weeks    Status On-going      OT LONG TERM GOAL #3   Title Pt will report a  pain level of approximately 3/10 or less when using her LUE for self care and required daily tasks.    Time 12    Period Weeks    Status On-going      OT LONG TERM GOAL #4   Title Patient will decrease her LUE fascial restrictions to minimal amount or less in order to increase the functional mobility needed to  complete all required reaching tasks.    Time 12    Period Weeks    Status On-going                   Plan - 11/16/20 1115     Clinical Impression Statement A: Myofascial release completed to address moderate fascial restrictions located in the left upper arm and upper trapezius. Provided manual faciliatation to the left shoulder to decrease scapular elevation during passive ROM and during standing AA/ROM. pt cued to place right hand on top of left during wall wash to help decrease scapular elevation. VC for form and technique were provided.    Body Structure / Function / Physical Skills ADL;UE functional use;Pain;ROM;Edema;Strength;Fascial restriction    Plan P: low to mid level reaching task while focusing on decreasing scapular elevation. Continue to work on increasing ROM as able.    Consulted and Agree with Plan of Care Patient             Patient will benefit from skilled therapeutic intervention in order to improve the following deficits and impairments:   Body Structure / Function / Physical Skills: ADL, UE functional use, Pain, ROM, Edema, Strength, Fascial restriction       Visit Diagnosis: Other symptoms and signs involving the musculoskeletal system  Stiffness of left shoulder, not elsewhere classified  Acute pain of left shoulder    Problem List Patient Active Problem List   Diagnosis Date Noted   Malignant neoplasm of upper-inner quadrant of left breast in female, estrogen receptor positive ([place]) 03/05/2017   History of colonic polyps 10/31/2015   High cholesterol 10/17/2015   SHOULDER, ARTHRITIS, DEGEN./OSTEO 02/28/2009   CONTRACTURE OF SHOULDER JOINT 02/16/2009   SHOULDER PAIN 02/16/2009    Ailene Ravel, OTR/L,CBIS  [phone]  11/16/2020, 12:11 PM  [place], [place], [address] Phone: [phone]   Fax:  [phone]  Name: Breanna Hill MRN: [id number] Date  of Birth: Oct 08, 1940

## 2020-11-21 ENCOUNTER — Other Ambulatory Visit: Payer: Self-pay

## 2020-11-21 ENCOUNTER — Ambulatory Visit (HOSPITAL_COMMUNITY): Payer: Medicare HMO

## 2020-11-21 ENCOUNTER — Encounter (HOSPITAL_COMMUNITY): Payer: Self-pay

## 2020-11-21 DIAGNOSIS — M25612 Stiffness of left shoulder, not elsewhere classified: Secondary | ICD-10-CM | POA: Diagnosis not present

## 2020-11-21 DIAGNOSIS — M25512 Pain in left shoulder: Secondary | ICD-10-CM | POA: Diagnosis not present

## 2020-11-21 DIAGNOSIS — R29898 Other symptoms and signs involving the musculoskeletal system: Secondary | ICD-10-CM | POA: Diagnosis not present

## 2020-11-21 NOTE — Therapy (Signed)
[place], [place], [address] Phone: [phone]   Fax:  [phone]  Occupational Therapy Treatment  Patient Details  Name: Breanna Hill MRN: [id number] Date of Birth: 1940-10-15 Referring Provider (OT): Ophelia Charter, MD   Encounter Date: 11/21/2020   OT End of Session - 11/21/20 1416     Visit Number 12    Number of Visits 24    Date for OT Re-Evaluation 01/06/21   mini reassess: 12/07/20   Authorization Type 1) [employer]etna Medicare HMO 2) [place]    Authorization Time Period 1) $35 copay no visit limit 2) $30 copay no authorization needed.    Progress Note Due on Visit 60    OT Start Time 1304    OT Stop Time 1338    OT Time Calculation (min) 34 min    Activity Tolerance Patient tolerated treatment well    Behavior During Therapy WFL for tasks assessed/performed             Past Medical History:  Diagnosis Date   Arthritis    R hand    Breast cancer ([place])    Breast disorder    cancer   Cancer ([place])    breast CA- diagnosed with needle biopsy   Depression    GERD (gastroesophageal reflux disease)    rare use of tums    High cholesterol 10/17/2015   History of radiation therapy 03/27/17- 04/23/17   Left Breast, 2.67 Gy in 15 fractions for a total dose of 40.05 Gy. Boost, 2 Gy in 5 fractions for a total dose of 10 Gy.    Hypertension    Hypothyroidism    Pre-diabetes    Hgba1C- in the 6 's , per pt., she monitors her diet    Past Surgical History:  Procedure Laterality Date   BACK SURGERY  1973   BREAST LUMPECTOMY Left 02/2017   BREAST LUMPECTOMY WITH RADIOACTIVE SEED AND SENTINEL LYMPH NODE BIOPSY Left 02/27/2017   Procedure: LEFT BREAST LUMPECTOMY WITH RADIOACTIVE SEED AND LEFT AXILLARY SENTINEL LYMPH NODE BIOPSY;  Surgeon: Donnie Mesa, MD;  Location: [facility];  Service: General;  Laterality: Left;   COLONOSCOPY     X 2   COLONOSCOPY N/A 02/22/2016   Procedure: COLONOSCOPY;   Surgeon: Rogene Houston, MD;  [facility];  Service: Endoscopy;  Laterality: N/A;  1:45   EYE SURGERY Bilateral    phlebpheroplasty   Left shoultder surgery     for bone spurs & frozen shoulder    REVERSE SHOULDER ARTHROPLASTY Left 09/14/2020   Procedure: REVERSE SHOULDER ARTHROPLASTY;  Surgeon: Hiram Gash, MD;  [facility];  Service: Orthopedics;  Laterality: Left;   Right bunionectomy     TONSILLECTOMY     TUBAL LIGATION      There were no vitals filed for this visit.   Subjective Assessment - 11/21/20 1321     Subjective  S: I'm sore today. I have been busy around the house today getting ready for Thanksgiving.    Currently in Pain? Yes    Pain Score 4     Pain Location Shoulder    Pain Orientation Left    Pain Descriptors / Indicators Aching;Sore    Pain Type Acute pain    Pain Onset Today    Pain Frequency Constant    Aggravating Factors  house work and cleaning for Thanksgiving.    Pain Relieving Factors heat, OTC pain medication.  Effect of Pain on Daily Activities moderate effect    Multiple Pain Sites No                [facility] OT Assessment - 11/21/20 1323       Assessment   Medical Diagnosis S/P left reverse total shoulder arthroplasty      Precautions   Precautions Shoulder;Other (comment)    Type of Shoulder Precautions see media tab for protocol.    Precaution Comments History of breast cancer and left side mastectomy                      OT Treatments/Exercises (OP) - 11/21/20 1323       Exercises   Exercises Shoulder      Shoulder Exercises: Supine   Protraction PROM;5 reps;AAROM;12 reps    Horizontal ABduction PROM;5 reps;AAROM;12 reps    External Rotation PROM;5 reps;AAROM;12 reps    Internal Rotation PROM;5 reps;AAROM;12 reps    Flexion PROM;5 reps;AAROM;12 reps    ABduction PROM;5 reps      Shoulder Exercises: Pulleys   Flexion 1 minute   standing   ABduction 1 minute   standing     Functional  Reaching Activities   Mid Level Mid level reaching task completed with [place] on door window for visual feedback for form and technique. Completed while trialing LUE arm extended while reaching as well as elbow bent.      Manual Therapy   Manual Therapy Myofascial release    Manual therapy comments completed separately from therapeutic exercises    Myofascial Release myofascial release and manual techniques to left upper arm, trapezius, and scapular regions to decrease pain and fascial restrictions and increase joint ROM                      OT Short Term Goals - 11/14/20 1132       OT SHORT TERM GOAL #1   Title Patient will be educated and independent with HEP in order to increase her ability to use her LUE for daily tasks 50% of the time or more.    Time 6    Period Weeks    Target Date 11/25/20      OT SHORT TERM GOAL #2   Title Patient will increase her LUE P/ROM to [facility] in order to increase her ability to complete upper body dressing tasks with less difficulty.    Time 6    Period Weeks    Status On-going      OT SHORT TERM GOAL #3   Title Patient will increase her LUE strength to 3/5 in order to return to using her LUE to fix her hair.    Time 6    Period Weeks    Status On-going      OT SHORT TERM GOAL #4   Title Patient will decrease her LUE fascial restrictions to moderate amount in order to increase the functional mobility needed to complete low level reaching tasks.    Time 6    Period Weeks               OT Long Term Goals - 10/19/20 0835       OT LONG TERM GOAL #1   Title Patient will increase her LUE A/ROM to [facility] in order to be able to complete functional reaching tasks at or above her shoulder level.    Time 12    Period Weeks    Status  On-going      OT LONG TERM GOAL #2   Title Patient will increase her LUE strength to 4/5 to return to lifting household items of moderate weight using her LUE.    Time 12    Period Weeks    Status  On-going      OT LONG TERM GOAL #3   Title Pt will report a pain level of approximately 3/10 or less when using her LUE for self care and required daily tasks.    Time 12    Period Weeks    Status On-going      OT LONG TERM GOAL #4   Title Patient will decrease her LUE fascial restrictions to minimal amount or less in order to increase the functional mobility needed to complete all required reaching tasks.    Time 12    Period Weeks    Status On-going                   Plan - 11/21/20 1418     Clinical Impression Statement A: Myofascial release was completed to address moderate fascial restrictions located in the left upper arm and upper trapezius. Faciliation to the left shoulder was provided during functional reaching task to decrease compensatory shoulder movements. VC for form and technique were provided.    Body Structure / Function / Physical Skills ADL;UE functional use;Pain;ROM;Edema;Strength;Fascial restriction    Plan P: Attempt UBE bike. Continue to work on decreasing scapular elevation during functional reaching and ROM exercises.    Consulted and Agree with Plan of Care Patient             Patient will benefit from skilled therapeutic intervention in order to improve the following deficits and impairments:   Body Structure / Function / Physical Skills: ADL, UE functional use, Pain, ROM, Edema, Strength, Fascial restriction       Visit Diagnosis: Other symptoms and signs involving the musculoskeletal system  Stiffness of left shoulder, not elsewhere classified  Acute pain of left shoulder    Problem List Patient Active Problem List   Diagnosis Date Noted   Malignant neoplasm of upper-inner quadrant of left breast in female, estrogen receptor positive ([place]) 03/05/2017   History of colonic polyps 10/31/2015   High cholesterol 10/17/2015   SHOULDER, ARTHRITIS, DEGEN./OSTEO 02/28/2009   CONTRACTURE OF SHOULDER JOINT 02/16/2009   SHOULDER PAIN  02/16/2009    Ailene Ravel, OTR/L,CBIS  [phone]  11/21/2020, 2:21 PM  [place], [place], [address] Phone: [phone]   Fax:  [phone]  Name: Breanna Hill MRN: [id number] Date of Birth: September 18, 1940

## 2020-11-22 DIAGNOSIS — Z23 Encounter for immunization: Secondary | ICD-10-CM | POA: Diagnosis not present

## 2020-11-23 ENCOUNTER — Encounter (HOSPITAL_COMMUNITY): Payer: Self-pay

## 2020-11-23 ENCOUNTER — Ambulatory Visit (HOSPITAL_COMMUNITY): Payer: Medicare HMO

## 2020-11-23 ENCOUNTER — Other Ambulatory Visit: Payer: Self-pay

## 2020-11-23 DIAGNOSIS — M25612 Stiffness of left shoulder, not elsewhere classified: Secondary | ICD-10-CM | POA: Diagnosis not present

## 2020-11-23 DIAGNOSIS — M25512 Pain in left shoulder: Secondary | ICD-10-CM

## 2020-11-23 DIAGNOSIS — I10 Essential (primary) hypertension: Secondary | ICD-10-CM | POA: Diagnosis not present

## 2020-11-23 DIAGNOSIS — R29898 Other symptoms and signs involving the musculoskeletal system: Secondary | ICD-10-CM | POA: Diagnosis not present

## 2020-11-23 DIAGNOSIS — E1159 Type 2 diabetes mellitus with other circulatory complications: Secondary | ICD-10-CM | POA: Diagnosis not present

## 2020-11-23 DIAGNOSIS — Z79899 Other long term (current) drug therapy: Secondary | ICD-10-CM | POA: Diagnosis not present

## 2020-11-23 DIAGNOSIS — M810 Age-related osteoporosis without current pathological fracture: Secondary | ICD-10-CM | POA: Diagnosis not present

## 2020-11-23 DIAGNOSIS — E785 Hyperlipidemia, unspecified: Secondary | ICD-10-CM | POA: Diagnosis not present

## 2020-11-23 DIAGNOSIS — E039 Hypothyroidism, unspecified: Secondary | ICD-10-CM | POA: Diagnosis not present

## 2020-11-23 NOTE — Therapy (Signed)
[place], [place], [address] Phone: [phone]   Fax:  [phone]  Occupational Therapy Treatment  Patient Details  Name: Breanna Hill MRN: [id number] Date of Birth: November 16, 1940 Referring Provider (OT): Ophelia Charter, MD   Encounter Date: 11/23/2020   OT End of Session - 11/23/20 1110     Visit Number 13    Number of Visits 24    Date for OT Re-Evaluation 01/06/21   mini reassess: 12/07/20   Authorization Type 1) [employer]etna Medicare HMO 2) [place]    Authorization Time Period 1) $35 copay no visit limit 2) $30 copay no authorization needed.    Progress Note Due on Visit 19    OT Start Time 1030    OT Stop Time 1108    OT Time Calculation (min) 38 min    Activity Tolerance Patient tolerated treatment well    Behavior During Therapy WFL for tasks assessed/performed             Past Medical History:  Diagnosis Date   Arthritis    R hand    Breast cancer ([place])    Breast disorder    cancer   Cancer ([place])    breast CA- diagnosed with needle biopsy   Depression    GERD (gastroesophageal reflux disease)    rare use of tums    High cholesterol 10/17/2015   History of radiation therapy 03/27/17- 04/23/17   Left Breast, 2.67 Gy in 15 fractions for a total dose of 40.05 Gy. Boost, 2 Gy in 5 fractions for a total dose of 10 Gy.    Hypertension    Hypothyroidism    Pre-diabetes    Hgba1C- in the 6 's , per pt., she monitors her diet    Past Surgical History:  Procedure Laterality Date   BACK SURGERY  1973   BREAST LUMPECTOMY Left 02/2017   BREAST LUMPECTOMY WITH RADIOACTIVE SEED AND SENTINEL LYMPH NODE BIOPSY Left 02/27/2017   Procedure: LEFT BREAST LUMPECTOMY WITH RADIOACTIVE SEED AND LEFT AXILLARY SENTINEL LYMPH NODE BIOPSY;  Surgeon: Donnie Mesa, MD;  Location: [facility];  Service: General;  Laterality: Left;   COLONOSCOPY     X 2   COLONOSCOPY N/A 02/22/2016   Procedure: COLONOSCOPY;   Surgeon: Rogene Houston, MD;  [facility];  Service: Endoscopy;  Laterality: N/A;  1:45   EYE SURGERY Bilateral    phlebpheroplasty   Left shoultder surgery     for bone spurs & frozen shoulder    REVERSE SHOULDER ARTHROPLASTY Left 09/14/2020   Procedure: REVERSE SHOULDER ARTHROPLASTY;  Surgeon: Hiram Gash, MD;  [facility];  Service: Orthopedics;  Laterality: Left;   Right bunionectomy     TONSILLECTOMY     TUBAL LIGATION      There were no vitals filed for this visit.   Subjective Assessment - 11/23/20 1035     Currently in Pain? Yes    Pain Score 4     Pain Location Shoulder    Pain Orientation Left    Pain Descriptors / Indicators Sore    Pain Type Acute pain    Pain Onset Today    Pain Frequency Constant    Aggravating Factors  house work and cleaning for Thanksgiving.    Pain Relieving Factors heat, OTC medication    Effect of Pain on Daily Activities moderate effect  [facility] OT Assessment - 11/23/20 1055       Assessment   Medical Diagnosis S/P left reverse total shoulder arthroplasty      Precautions   Precautions Shoulder;Other (comment)    Type of Shoulder Precautions see media tab for protocol.    Precaution Comments History of breast cancer and left side mastectomy                      OT Treatments/Exercises (OP) - 11/23/20 1056       Exercises   Exercises Shoulder      Shoulder Exercises: Supine   Protraction PROM;5 reps;AROM;10 reps    Horizontal ABduction PROM;5 reps;AAROM;12 reps    External Rotation PROM;5 reps;AAROM;12 reps    Internal Rotation PROM;5 reps;AAROM;12 reps    Flexion PROM;5 reps;AROM;10 reps;Limitations    Flexion Limitations to shoulder level    ABduction PROM;5 reps      Shoulder Exercises: ROM/Strengthening   UBE (Upper Arm Bike) Level 1 2' forward 2' reverse pace: 5.0-6.0      Manual Therapy   Manual Therapy Myofascial release    Manual therapy comments completed  separately from therapeutic exercises    Myofascial Release myofascial release and manual techniques to left upper arm, trapezius, and scapular regions to decrease pain and fascial restrictions and increase joint ROM                      OT Short Term Goals - 11/14/20 1132       OT SHORT TERM GOAL #1   Title Patient will be educated and independent with HEP in order to increase her ability to use her LUE for daily tasks 50% of the time or more.    Time 6    Period Weeks    Target Date 11/25/20      OT SHORT TERM GOAL #2   Title Patient will increase her LUE P/ROM to [facility] in order to increase her ability to complete upper body dressing tasks with less difficulty.    Time 6    Period Weeks    Status On-going      OT SHORT TERM GOAL #3   Title Patient will increase her LUE strength to 3/5 in order to return to using her LUE to fix her hair.    Time 6    Period Weeks    Status On-going      OT SHORT TERM GOAL #4   Title Patient will decrease her LUE fascial restrictions to moderate amount in order to increase the functional mobility needed to complete low level reaching tasks.    Time 6    Period Weeks               OT Long Term Goals - 10/19/20 0835       OT LONG TERM GOAL #1   Title Patient will increase her LUE A/ROM to [facility] in order to be able to complete functional reaching tasks at or above her shoulder level.    Time 12    Period Weeks    Status On-going      OT LONG TERM GOAL #2   Title Patient will increase her LUE strength to 4/5 to return to lifting household items of moderate weight using her LUE.    Time 12    Period Weeks    Status On-going      OT LONG TERM GOAL #3   Title Pt will report  a pain level of approximately 3/10 or less when using her LUE for self care and required daily tasks.    Time 12    Period Weeks    Status On-going      OT LONG TERM GOAL #4   Title Patient will decrease her LUE fascial restrictions to minimal amount or  less in order to increase the functional mobility needed to complete all required reaching tasks.    Time 12    Period Weeks    Status On-going                   Plan - 11/23/20 1110     Clinical Impression Statement A: Focused session mainly on manual techniques to address moderate fascial restrictions along the upper trapezius and lats to help increase passive and active ROM. Completed supine flexion and protraction actively while continuing to use PVC pipe to assist with increasing ROM for remainder of shoulder ranges. VC for form and technique were provided. Added UBE bike at the end of session.    Body Structure / Function / Physical Skills ADL;UE functional use;Pain;ROM;Edema;Strength;Fascial restriction    Plan P: Continue to work on decreasing scapular elevation during functional reaching and ROM exercises.    Consulted and Agree with Plan of Care Patient             Patient will benefit from skilled therapeutic intervention in order to improve the following deficits and impairments:   Body Structure / Function / Physical Skills: ADL, UE functional use, Pain, ROM, Edema, Strength, Fascial restriction       Visit Diagnosis: Other symptoms and signs involving the musculoskeletal system  Stiffness of left shoulder, not elsewhere classified  Acute pain of left shoulder    Problem List Patient Active Problem List   Diagnosis Date Noted   Malignant neoplasm of upper-inner quadrant of left breast in female, estrogen receptor positive ([place]) 03/05/2017   History of colonic polyps 10/31/2015   High cholesterol 10/17/2015   SHOULDER, ARTHRITIS, DEGEN./OSTEO 02/28/2009   CONTRACTURE OF SHOULDER JOINT 02/16/2009   SHOULDER PAIN 02/16/2009    Ailene Ravel, OTR/L,CBIS  [phone]  11/23/2020, 11:14 AM  [place], [place], [address] Phone: [phone]   Fax:  [phone]  Name: Breanna Hill MRN: [id number] Date of Birth: 1940/03/16

## 2020-11-28 ENCOUNTER — Encounter (HOSPITAL_COMMUNITY): Payer: Self-pay

## 2020-11-28 ENCOUNTER — Other Ambulatory Visit: Payer: Self-pay

## 2020-11-28 ENCOUNTER — Ambulatory Visit (HOSPITAL_COMMUNITY): Payer: Medicare HMO

## 2020-11-28 DIAGNOSIS — R29898 Other symptoms and signs involving the musculoskeletal system: Secondary | ICD-10-CM | POA: Diagnosis not present

## 2020-11-28 DIAGNOSIS — M25512 Pain in left shoulder: Secondary | ICD-10-CM

## 2020-11-28 DIAGNOSIS — M25612 Stiffness of left shoulder, not elsewhere classified: Secondary | ICD-10-CM | POA: Diagnosis not present

## 2020-11-28 NOTE — Therapy (Signed)
[place], [place], [address] Phone: [phone]   Fax:  [phone]  Occupational Therapy Treatment  Patient Details  Name: Breanna Hill MRN: [id number] Date of Birth: 1940-11-11 Referring Provider (OT): Ophelia Charter, MD   Encounter Date: 11/28/2020   OT End of Session - 11/28/20 1119     Visit Number 14    Number of Visits 24    Date for OT Re-Evaluation 01/06/21   mini reassess: 12/07/20   Authorization Type 1) [employer]na Medicare HMO 2) [place]    Authorization Time Period 1) $35 copay no visit limit 2) $30 copay no authorization needed.    Progress Note Due on Visit 19    OT Start Time 1035    OT Stop Time 1110    OT Time Calculation (min) 35 min    Activity Tolerance Patient tolerated treatment well    Behavior During Therapy WFL for tasks assessed/performed             Past Medical History:  Diagnosis Date   Arthritis    R hand    Breast cancer ([place])    Breast disorder    cancer   Cancer ([place])    breast CA- diagnosed with needle biopsy   Depression    GERD (gastroesophageal reflux disease)    rare use of tums    High cholesterol 10/17/2015   History of radiation therapy 03/27/17- 04/23/17   Left Breast, 2.67 Gy in 15 fractions for a total dose of 40.05 Gy. Boost, 2 Gy in 5 fractions for a total dose of 10 Gy.    Hypertension    Hypothyroidism    Pre-diabetes    Hgba1C- in the 6 's , per pt., she monitors her diet    Past Surgical History:  Procedure Laterality Date   BACK SURGERY  1973   BREAST LUMPECTOMY Left 02/2017   BREAST LUMPECTOMY WITH RADIOACTIVE SEED AND SENTINEL LYMPH NODE BIOPSY Left 02/27/2017   Procedure: LEFT BREAST LUMPECTOMY WITH RADIOACTIVE SEED AND LEFT AXILLARY SENTINEL LYMPH NODE BIOPSY;  Surgeon: Donnie Mesa, MD;  Location: [facility];  Service: General;  Laterality: Left;   COLONOSCOPY     X 2   COLONOSCOPY N/A 02/22/2016   Procedure: COLONOSCOPY;   Surgeon: Rogene Houston, MD;  [facility];  Service: Endoscopy;  Laterality: N/A;  1:45   EYE SURGERY Bilateral    phlebpheroplasty   Left shoultder surgery     for bone spurs & frozen shoulder    REVERSE SHOULDER ARTHROPLASTY Left 09/14/2020   Procedure: REVERSE SHOULDER ARTHROPLASTY;  Surgeon: Hiram Gash, MD;  [facility];  Service: Orthopedics;  Laterality: Left;   Right bunionectomy     TONSILLECTOMY     TUBAL LIGATION      There were no vitals filed for this visit.   Subjective Assessment - 11/28/20 1036     Subjective  S: I sleep with a heating pad under my shoulders which seems to help.    Currently in Pain? Yes    Pain Score 4     Pain Location Shoulder    Pain Orientation Left    Pain Descriptors / Indicators Sore    Pain Type Acute pain    Pain Onset Yesterday    Pain Frequency Constant    Aggravating Factors  using it more than usual    Pain Relieving Factors OTC medication, heat    Effect  of Pain on Daily Activities moderate effect                [facility] OT Assessment - 11/28/20 1038       Assessment   Medical Diagnosis S/P left reverse total shoulder arthroplasty      Precautions   Precautions Shoulder;Other (comment)    Type of Shoulder Precautions see media tab for protocol.    Precaution Comments History of breast cancer and left side mastectomy                      OT Treatments/Exercises (OP) - 11/28/20 1050       Exercises   Exercises Shoulder      Shoulder Exercises: Supine   Protraction PROM;5 reps;AROM;10 reps    Horizontal ABduction PROM;5 reps;AAROM;12 reps    External Rotation PROM;5 reps;AAROM;12 reps    Internal Rotation PROM;5 reps;AAROM;12 reps    Flexion PROM;5 reps;AROM;10 reps;Limitations    Flexion Limitations to shoulder level    ABduction PROM;5 reps      Shoulder Exercises: Standing   Protraction AROM;AAROM;5 reps    External Rotation AAROM;12 reps   using towel roll   Internal  Rotation AAROM;12 reps      Shoulder Exercises: ROM/Strengthening   Over Head Lace standing; started at top and laced chain to half way mark before unlacing.    Proximal Shoulder Strengthening, Supine 10X A/ROM 1 rest break      Manual Therapy   Manual Therapy Myofascial release    Manual therapy comments completed separately from therapeutic exercises    Myofascial Release myofascial release and manual techniques to left upper arm, trapezius, and scapular regions to decrease pain and fascial restrictions and increase joint ROM                    OT Education - 11/28/20 1121     Education Details provided education use of round brush hair dryer or purchasing a dryer stand to increase ability to blow dry her hair at home.    Person(s) Educated Patient    Methods Explanation    Comprehension Verbalized understanding              OT Short Term Goals - 11/14/20 1132       OT SHORT TERM GOAL #1   Title Patient will be educated and independent with HEP in order to increase her ability to use her LUE for daily tasks 50% of the time or more.    Time 6    Period Weeks    Target Date 11/25/20      OT SHORT TERM GOAL #2   Title Patient will increase her LUE P/ROM to [facility] in order to increase her ability to complete upper body dressing tasks with less difficulty.    Time 6    Period Weeks    Status On-going      OT SHORT TERM GOAL #3   Title Patient will increase her LUE strength to 3/5 in order to return to using her LUE to fix her hair.    Time 6    Period Weeks    Status On-going      OT SHORT TERM GOAL #4   Title Patient will decrease her LUE fascial restrictions to moderate amount in order to increase the functional mobility needed to complete low level reaching tasks.    Time 6    Period Weeks  OT Long Term Goals - 10/19/20 0835       OT LONG TERM GOAL #1   Title Patient will increase her LUE A/ROM to [facility] in order to be able to complete  functional reaching tasks at or above her shoulder level.    Time 12    Period Weeks    Status On-going      OT LONG TERM GOAL #2   Title Patient will increase her LUE strength to 4/5 to return to lifting household items of moderate weight using her LUE.    Time 12    Period Weeks    Status On-going      OT LONG TERM GOAL #3   Title Pt will report a pain level of approximately 3/10 or less when using her LUE for self care and required daily tasks.    Time 12    Period Weeks    Status On-going      OT LONG TERM GOAL #4   Title Patient will decrease her LUE fascial restrictions to minimal amount or less in order to increase the functional mobility needed to complete all required reaching tasks.    Time 12    Period Weeks    Status On-going                   Plan - 11/28/20 1119     Clinical Impression Statement A: Completed manual techniques to address minimal fascial restrictions in the scapularis and upper arm region. Pt reports increased tenderness in these areas. Allowed pain to guide session and how far to complete passive ROM exercises. Continued to provide VC for form and technique due to increased scapular elevation.    Body Structure / Function / Physical Skills ADL;UE functional use;Pain;ROM;Edema;Strength;Fascial restriction    Plan P: Continue to work on increase shoulder and scapular stability as able to increase ability to use hair dryer with less fatigue in left arm.    Consulted and Agree with Plan of Care Patient             Patient will benefit from skilled therapeutic intervention in order to improve the following deficits and impairments:   Body Structure / Function / Physical Skills: ADL, UE functional use, Pain, ROM, Edema, Strength, Fascial restriction       Visit Diagnosis: Other symptoms and signs involving the musculoskeletal system  Stiffness of left shoulder, not elsewhere classified  Acute pain of left shoulder    Problem  List Patient Active Problem List   Diagnosis Date Noted   Malignant neoplasm of upper-inner quadrant of left breast in female, estrogen receptor positive ([place]) 03/05/2017   History of colonic polyps 10/31/2015   High cholesterol 10/17/2015   SHOULDER, ARTHRITIS, DEGEN./OSTEO 02/28/2009   CONTRACTURE OF SHOULDER JOINT 02/16/2009   SHOULDER PAIN 02/16/2009    Ailene Ravel, OTR/L,CBIS  [phone]  11/28/2020, 11:45 AM  [place], [place], [address] Phone: [phone]   Fax:  [phone]  Name: Breanna Hill MRN: [id number] Date of Birth: 1940/02/19

## 2020-11-29 DIAGNOSIS — I10 Essential (primary) hypertension: Secondary | ICD-10-CM | POA: Diagnosis not present

## 2020-11-29 DIAGNOSIS — R69 Illness, unspecified: Secondary | ICD-10-CM | POA: Diagnosis not present

## 2020-11-29 DIAGNOSIS — E039 Hypothyroidism, unspecified: Secondary | ICD-10-CM | POA: Diagnosis not present

## 2020-11-29 DIAGNOSIS — E119 Type 2 diabetes mellitus without complications: Secondary | ICD-10-CM | POA: Diagnosis not present

## 2020-11-29 DIAGNOSIS — R7309 Other abnormal glucose: Secondary | ICD-10-CM | POA: Diagnosis not present

## 2020-11-30 ENCOUNTER — Other Ambulatory Visit: Payer: Self-pay

## 2020-11-30 ENCOUNTER — Encounter (HOSPITAL_COMMUNITY): Payer: Self-pay

## 2020-11-30 ENCOUNTER — Ambulatory Visit (HOSPITAL_COMMUNITY): Payer: Medicare HMO

## 2020-11-30 DIAGNOSIS — R29898 Other symptoms and signs involving the musculoskeletal system: Secondary | ICD-10-CM | POA: Diagnosis not present

## 2020-11-30 DIAGNOSIS — M25512 Pain in left shoulder: Secondary | ICD-10-CM

## 2020-11-30 DIAGNOSIS — M25612 Stiffness of left shoulder, not elsewhere classified: Secondary | ICD-10-CM | POA: Diagnosis not present

## 2020-11-30 NOTE — Therapy (Signed)
[place], [place], [address] Phone: [phone]   Fax:  [phone]  Occupational Therapy Treatment  Patient Details  Name: Breanna Hill MRN: [id number] Date of Birth: [birth year] Referring Provider (OT): Ophelia Charter, MD   Encounter Date: 11/30/2020   OT End of Session - 11/30/20 1257     Visit Number 15    Number of Visits 24    Date for OT Re-Evaluation 01/06/21   mini reassess: 12/07/20   Authorization Type 1) A[employer]na Medicare HMO 2) [place]    Authorization Time Period 1) $35 copay no visit limit 2) $30 copay no authorization needed.    Progress Note Due on Visit 19    OT Start Time 1030    OT Stop Time 1108    OT Time Calculation (min) 38 min    Activity Tolerance Patient tolerated treatment well    Behavior During Therapy WFL for tasks assessed/performed             Past Medical History:  Diagnosis Date   Arthritis    R hand    Breast cancer ([place])    Breast disorder    cancer   Cancer ([place])    breast CA- diagnosed with needle biopsy   Depression    GERD (gastroesophageal reflux disease)    rare use of tums    High cholesterol 10/17/2015   History of radiation therapy 03/27/17- 04/23/17   Left Breast, 2.67 Gy in 15 fractions for a total dose of 40.05 Gy. Boost, 2 Gy in 5 fractions for a total dose of 10 Gy.    Hypertension    Hypothyroidism    Pre-diabetes    Hgba1C- in the 6 's , per pt., she monitors her diet    Past Surgical History:  Procedure Laterality Date   BACK SURGERY  1973   BREAST LUMPECTOMY Left 02/2017   BREAST LUMPECTOMY WITH RADIOACTIVE SEED AND SENTINEL LYMPH NODE BIOPSY Left 02/27/2017   Procedure: LEFT BREAST LUMPECTOMY WITH RADIOACTIVE SEED AND LEFT AXILLARY SENTINEL LYMPH NODE BIOPSY;  Surgeon: Donnie Mesa, MD;  Location: [facility];  Service: General;  Laterality: Left;   COLONOSCOPY     X 2   COLONOSCOPY N/A 02/22/2016   Procedure: COLONOSCOPY;   Surgeon: Rogene Houston, MD;  [facility];  Service: Endoscopy;  Laterality: N/A;  1:45   EYE SURGERY Bilateral    phlebpheroplasty   Left shoultder surgery     for bone spurs & frozen shoulder    REVERSE SHOULDER ARTHROPLASTY Left 09/14/2020   Procedure: REVERSE SHOULDER ARTHROPLASTY;  Surgeon: Hiram Gash, MD;  [facility];  Service: Orthopedics;  Laterality: Left;   Right bunionectomy     TONSILLECTOMY     TUBAL LIGATION      There were no vitals filed for this visit.   Subjective Assessment - 11/30/20 1036     Currently in Pain? Yes    Pain Score 4     Pain Location Shoulder    Pain Orientation Left    Pain Descriptors / Indicators Sore    Pain Type Acute pain    Pain Onset Yesterday    Pain Frequency Constant    Aggravating Factors  using it more than usual    Pain Relieving Factors OTC medication, heat    Effect of Pain on Daily Activities moderate effect  [facility] OT Assessment - 11/30/20 1050       Assessment   Medical Diagnosis S/P left reverse total shoulder arthroplasty      Precautions   Precautions Shoulder;Other (comment)    Type of Shoulder Precautions see media tab for protocol.    Precaution Comments History of breast cancer and left side mastectomy                      OT Treatments/Exercises (OP) - 11/30/20 1038       Exercises   Exercises Shoulder      Shoulder Exercises: Supine   Protraction PROM;5 reps;AROM;12 reps    Horizontal ABduction PROM;5 reps;AAROM;12 reps    External Rotation PROM;5 reps;AAROM;12 reps    Internal Rotation PROM;5 reps;AAROM;12 reps    Flexion PROM;5 reps;AROM;10 reps;Limitations    Flexion Limitations to shoulder level    ABduction PROM;5 reps      Shoulder Exercises: Standing   Extension Theraband;10 reps    Theraband Level (Shoulder Extension) Level 2 (Red)    Row Theraband;12 reps    Theraband Level (Shoulder Row) Level 2 (Red)    Retraction AAROM;10  reps;Limitations    Retraction Limitations Provided light faciliation of the left arm to maintain proper form and technique.      Shoulder Exercises: ROM/Strengthening   UBE (Upper Arm Bike) Level 1 2' forward 2' reverse pace: 5.0-6.0    Wall [place] 1' mid level      Manual Therapy   Manual Therapy Myofascial release    Manual therapy comments completed separately from therapeutic exercises    Myofascial Release myofascial release and manual techniques to left upper arm, trapezius, and scapular regions to decrease pain and fascial restrictions and increase joint ROM                      OT Short Term Goals - 11/14/20 1132       OT SHORT TERM GOAL #1   Title Patient will be educated and independent with HEP in order to increase her ability to use her LUE for daily tasks 50% of the time or more.    Time 6    Period Weeks    Target Date 11/25/20      OT SHORT TERM GOAL #2   Title Patient will increase her LUE P/ROM to [facility] in order to increase her ability to complete upper body dressing tasks with less difficulty.    Time 6    Period Weeks    Status On-going      OT SHORT TERM GOAL #3   Title Patient will increase her LUE strength to 3/5 in order to return to using her LUE to fix her hair.    Time 6    Period Weeks    Status On-going      OT SHORT TERM GOAL #4   Title Patient will decrease her LUE fascial restrictions to moderate amount in order to increase the functional mobility needed to complete low level reaching tasks.    Time 6    Period Weeks               OT Long Term Goals - 10/19/20 0835       OT LONG TERM GOAL #1   Title Patient will increase her LUE A/ROM to [facility] in order to be able to complete functional reaching tasks at or above her shoulder level.    Time 12  Period Weeks    Status On-going      OT LONG TERM GOAL #2   Title Patient will increase her LUE strength to 4/5 to return to lifting household items of moderate weight using her  LUE.    Time 12    Period Weeks    Status On-going      OT LONG TERM GOAL #3   Title Pt will report a pain level of approximately 3/10 or less when using her LUE for self care and required daily tasks.    Time 12    Period Weeks    Status On-going      OT LONG TERM GOAL #4   Title Patient will decrease her LUE fascial restrictions to minimal amount or less in order to increase the functional mobility needed to complete all required reaching tasks.    Time 12    Period Weeks    Status On-going                   Plan - 11/30/20 1257     Clinical Impression Statement A: Conitnued with myofascial release to address minimal fascial restrictions in the upper trapezius region. Due to increased soreness this session, patient was limited with passive ROM tolerance. VC were provided for form and technique during session while provide light tactile cueing. Added scapular strengthening.    Body Structure / Function / Physical Skills ADL;UE functional use;Pain;ROM;Edema;Strength;Fascial restriction    Plan P: Attempt to complete more scapular mobility to be able to decrease scapular elevation. Ask about left arm mobility after breast cancer treatment.    Consulted and Agree with Plan of Care Patient             Patient will benefit from skilled therapeutic intervention in order to improve the following deficits and impairments:   Body Structure / Function / Physical Skills: ADL, UE functional use, Pain, ROM, Edema, Strength, Fascial restriction       Visit Diagnosis: Stiffness of left shoulder, not elsewhere classified  Other symptoms and signs involving the musculoskeletal system  Acute pain of left shoulder    Problem List Patient Active Problem List   Diagnosis Date Noted   Malignant neoplasm of upper-inner quadrant of left breast in female, estrogen receptor positive ([place]) 03/05/2017   History of colonic polyps 10/31/2015   High cholesterol 10/17/2015   SHOULDER,  ARTHRITIS, DEGEN./OSTEO 02/28/2009   CONTRACTURE OF SHOULDER JOINT 02/16/2009   SHOULDER PAIN 02/16/2009    Ailene Ravel, OTR/L,CBIS  [phone]  11/30/2020, 1:00 PM  [place], [place], [address] Phone: [phone]   Fax:  [phone]  Name: Breanna Hill MRN: [id number] Date of Birth: 1940/12/26

## 2020-12-05 ENCOUNTER — Encounter (HOSPITAL_COMMUNITY): Payer: Self-pay

## 2020-12-05 ENCOUNTER — Ambulatory Visit (HOSPITAL_COMMUNITY): Payer: Medicare HMO | Attending: Orthopaedic Surgery

## 2020-12-05 ENCOUNTER — Other Ambulatory Visit: Payer: Self-pay

## 2020-12-05 DIAGNOSIS — M25612 Stiffness of left shoulder, not elsewhere classified: Secondary | ICD-10-CM | POA: Insufficient documentation

## 2020-12-05 DIAGNOSIS — R29898 Other symptoms and signs involving the musculoskeletal system: Secondary | ICD-10-CM | POA: Insufficient documentation

## 2020-12-05 DIAGNOSIS — M25512 Pain in left shoulder: Secondary | ICD-10-CM | POA: Insufficient documentation

## 2020-12-05 NOTE — Therapy (Signed)
[place], [place], [address] Phone: [phone]   Fax:  [phone]  Occupational Therapy Treatment  Patient Details  Name: Breanna Hill MRN: [id number] Date of Birth: 08-09-1940 Referring Provider (OT): Ophelia Charter, MD   Encounter Date: 12/05/2020   OT End of Session - 12/05/20 1114     Visit Number 16    Number of Visits 24    Date for OT Re-Evaluation 01/06/21    Authorization Type 1) [employer]etna Medicare HMO 2) [place]    Authorization Time Period 1) $35 copay no visit limit 2) $30 copay no authorization needed.    Progress Note Due on Visit 19    OT Start Time 1030    OT Stop Time 1108    OT Time Calculation (min) 38 min    Activity Tolerance Patient tolerated treatment well    Behavior During Therapy WFL for tasks assessed/performed             Past Medical History:  Diagnosis Date   Arthritis    R hand    Breast cancer ([place])    Breast disorder    cancer   Cancer ([place])    breast CA- diagnosed with needle biopsy   Depression    GERD (gastroesophageal reflux disease)    rare use of tums    High cholesterol 10/17/2015   History of radiation therapy 03/27/17- 04/23/17   Left Breast, 2.67 Gy in 15 fractions for a total dose of 40.05 Gy. Boost, 2 Gy in 5 fractions for a total dose of 10 Gy.    Hypertension    Hypothyroidism    Pre-diabetes    Hgba1C- in the 6 's , per pt., she monitors her diet    Past Surgical History:  Procedure Laterality Date   BACK SURGERY  1973   BREAST LUMPECTOMY Left 02/2017   BREAST LUMPECTOMY WITH RADIOACTIVE SEED AND SENTINEL LYMPH NODE BIOPSY Left 02/27/2017   Procedure: LEFT BREAST LUMPECTOMY WITH RADIOACTIVE SEED AND LEFT AXILLARY SENTINEL LYMPH NODE BIOPSY;  Surgeon: Donnie Mesa, MD;  Location: [facility];  Service: General;  Laterality: Left;   COLONOSCOPY     X 2   COLONOSCOPY N/A 02/22/2016   Procedure: COLONOSCOPY;  Surgeon: Rogene Houston, MD;   [facility];  Service: Endoscopy;  Laterality: N/A;  1:45   EYE SURGERY Bilateral    phlebpheroplasty   Left shoultder surgery     for bone spurs & frozen shoulder    REVERSE SHOULDER ARTHROPLASTY Left 09/14/2020   Procedure: REVERSE SHOULDER ARTHROPLASTY;  Surgeon: Hiram Gash, MD;  [facility];  Service: Orthopedics;  Laterality: Left;   Right bunionectomy     TONSILLECTOMY     TUBAL LIGATION      There were no vitals filed for this visit.   Subjective Assessment - 12/05/20 1034     Subjective  S: I've been doing alot of house cleaning.    Currently in Pain? Yes    Pain Score 3     Pain Location Shoulder    Pain Orientation Left    Pain Descriptors / Indicators Sore    Pain Type Acute pain    Pain Onset Yesterday    Pain Frequency Constant    Aggravating Factors  using it more than usual    Pain Relieving Factors OTC medicaid, heat    Effect of Pain on Daily Activities moderate effect  Multiple Pain Sites No                [facility] OT Assessment - 12/05/20 1115       Assessment   Medical Diagnosis S/P left reverse total shoulder arthroplasty    Next MD Visit 12/16/20      Precautions   Precautions Shoulder;Other (comment)    Type of Shoulder Precautions see media tab for protocol.    Precaution Comments History of breast cancer and left side mastectomy                      OT Treatments/Exercises (OP) - 12/05/20 1048       Exercises   Exercises Shoulder      Shoulder Exercises: Supine   Protraction PROM;5 reps;AROM;12 reps    Horizontal ABduction PROM;5 reps;AROM;12 reps    External Rotation PROM;5 reps    Internal Rotation PROM;5 reps    Flexion PROM;5 reps;AROM;10 reps    ABduction PROM;5 reps      Shoulder Exercises: Standing   Extension Theraband;10 reps    Theraband Level (Shoulder Extension) Level 2 (Red)    Row Theraband;12 reps    Theraband Level (Shoulder Row) Level 2 (Red)      Shoulder Exercises:  ROM/Strengthening   UBE (Upper Arm Bike) Level 1 2' forward 2' reverse pace: 5.0-6.0    Other ROM/Strengthening Exercises PVC pipe slide 10X Flexion 10X abduction      Manual Therapy   Manual Therapy Myofascial release    Manual therapy comments completed separately from therapeutic exercises    Myofascial Release myofascial release and manual techniques to left upper arm, trapezius, and scapular regions to decrease pain and fascial restrictions and increase joint ROM                      OT Short Term Goals - 11/14/20 1132       OT SHORT TERM GOAL #1   Title Patient will be educated and independent with HEP in order to increase her ability to use her LUE for daily tasks 50% of the time or more.    Time 6    Period Weeks    Target Date 11/25/20      OT SHORT TERM GOAL #2   Title Patient will increase her LUE P/ROM to [facility] in order to increase her ability to complete upper body dressing tasks with less difficulty.    Time 6    Period Weeks    Status On-going      OT SHORT TERM GOAL #3   Title Patient will increase her LUE strength to 3/5 in order to return to using her LUE to fix her hair.    Time 6    Period Weeks    Status On-going      OT SHORT TERM GOAL #4   Title Patient will decrease her LUE fascial restrictions to moderate amount in order to increase the functional mobility needed to complete low level reaching tasks.    Time 6    Period Weeks               OT Long Term Goals - 10/19/20 0835       OT LONG TERM GOAL #1   Title Patient will increase her LUE A/ROM to [facility] in order to be able to complete functional reaching tasks at or above her shoulder level.    Time 12    Period Weeks  Status On-going      OT LONG TERM GOAL #2   Title Patient will increase her LUE strength to 4/5 to return to lifting household items of moderate weight using her LUE.    Time 12    Period Weeks    Status On-going      OT LONG TERM GOAL #3   Title Pt will  report a pain level of approximately 3/10 or less when using her LUE for self care and required daily tasks.    Time 12    Period Weeks    Status On-going      OT LONG TERM GOAL #4   Title Patient will decrease her LUE fascial restrictions to minimal amount or less in order to increase the functional mobility needed to complete all required reaching tasks.    Time 12    Period Weeks    Status On-going                   Plan - 12/05/20 1109     Clinical Impression Statement A: Myofascial release needed primarily in the right upper arm region to address fascial restrictions. Continues to experience soreness during passive stretching which limisted ROM tolerance. VC were provided with tactile cues for form and technique especially during therband scapular strengthening exercises.    Body Structure / Function / Physical Skills ADL;UE functional use;Pain;ROM;Edema;Strength;Fascial restriction    Plan P: Update HEP ( A/ROM either supine or standing/ both ). 12 weeks post op. Ok to  begin IR/backwards extension. Complete therapy ball circles    Consulted and Agree with Plan of Care Patient             Patient will benefit from skilled therapeutic intervention in order to improve the following deficits and impairments:   Body Structure / Function / Physical Skills: ADL, UE functional use, Pain, ROM, Edema, Strength, Fascial restriction       Visit Diagnosis: Stiffness of left shoulder, not elsewhere classified  Other symptoms and signs involving the musculoskeletal system  Acute pain of left shoulder    Problem List Patient Active Problem List   Diagnosis Date Noted   Malignant neoplasm of upper-inner quadrant of left breast in female, estrogen receptor positive ([place]) 03/05/2017   History of colonic polyps 10/31/2015   High cholesterol 10/17/2015   SHOULDER, ARTHRITIS, DEGEN./OSTEO 02/28/2009   CONTRACTURE OF SHOULDER JOINT 02/16/2009   SHOULDER PAIN 02/16/2009     Ailene Ravel, OTR/L,CBIS  [phone]  12/05/2020, 11:16 AM  [place], [place], [address] Phone: [phone]   Fax:  [phone]  Name: Breanna Hill MRN: [id number] Date of Birth: [birth year]

## 2020-12-07 ENCOUNTER — Ambulatory Visit (HOSPITAL_COMMUNITY): Payer: Medicare HMO

## 2020-12-07 ENCOUNTER — Encounter (HOSPITAL_COMMUNITY): Payer: Self-pay

## 2020-12-07 ENCOUNTER — Other Ambulatory Visit: Payer: Self-pay

## 2020-12-07 DIAGNOSIS — M25612 Stiffness of left shoulder, not elsewhere classified: Secondary | ICD-10-CM | POA: Diagnosis not present

## 2020-12-07 DIAGNOSIS — R29898 Other symptoms and signs involving the musculoskeletal system: Secondary | ICD-10-CM

## 2020-12-07 DIAGNOSIS — M25512 Pain in left shoulder: Secondary | ICD-10-CM | POA: Diagnosis not present

## 2020-12-07 NOTE — Therapy (Signed)
[place], [place], [address] Phone: [phone]   Fax:  [phone]  Occupational Therapy Treatment  Patient Details  Name: Breanna Hill MRN: [id number] Date of Birth: Dec 27, 1940 Referring Provider (OT): Ophelia Charter, MD   Encounter Date: 12/07/2020   OT End of Session - 12/07/20 1114     Visit Number 17    Number of Visits 24    Date for OT Re-Evaluation 01/06/21    Authorization Type 1) [employer] Medicare HMO 2) [place]    Authorization Time Period 1) $35 copay no visit limit 2) $30 copay no authorization needed.    Progress Note Due on Visit 19    OT Start Time 1030    OT Stop Time 1108    OT Time Calculation (min) 38 min    Activity Tolerance Patient limited by pain    Behavior During Therapy WFL for tasks assessed/performed             Past Medical History:  Diagnosis Date   Arthritis    R hand    Breast cancer ([place])    Breast disorder    cancer   Cancer ([place])    breast CA- diagnosed with needle biopsy   Depression    GERD (gastroesophageal reflux disease)    rare use of tums    High cholesterol 10/17/2015   History of radiation therapy 03/27/17- 04/23/17   Left Breast, 2.67 Gy in 15 fractions for a total dose of 40.05 Gy. Boost, 2 Gy in 5 fractions for a total dose of 10 Gy.    Hypertension    Hypothyroidism    Pre-diabetes    Hgba1C- in the 6 's , per pt., she monitors her diet    Past Surgical History:  Procedure Laterality Date   BACK SURGERY  1973   BREAST LUMPECTOMY Left 02/2017   BREAST LUMPECTOMY WITH RADIOACTIVE SEED AND SENTINEL LYMPH NODE BIOPSY Left 02/27/2017   Procedure: LEFT BREAST LUMPECTOMY WITH RADIOACTIVE SEED AND LEFT AXILLARY SENTINEL LYMPH NODE BIOPSY;  Surgeon: Donnie Mesa, MD;  Location: [facility];  Service: General;  Laterality: Left;   COLONOSCOPY     X 2   COLONOSCOPY N/A 02/22/2016   Procedure: COLONOSCOPY;  Surgeon: Rogene Houston, MD;  [facility];  Service: Endoscopy;  Laterality: N/A;  1:45   EYE SURGERY Bilateral    phlebpheroplasty   Left shoultder surgery     for bone spurs & frozen shoulder    REVERSE SHOULDER ARTHROPLASTY Left 09/14/2020   Procedure: REVERSE SHOULDER ARTHROPLASTY;  Surgeon: Hiram Gash, MD;  Location: [facility];  Service: Orthopedics;  Laterality: Left;   Right bunionectomy     TONSILLECTOMY     TUBAL LIGATION      There were no vitals filed for this visit.   Subjective Assessment - 12/07/20 1037     Subjective  S: I didn't put the heating pad on it and I usually do when I go to bed.    Currently in Pain? Yes    Pain Score 7     Pain Location Shoulder    Pain Orientation Left    Pain Descriptors / Indicators Sore    Pain Type Acute pain    Pain Onset Yesterday    Pain Frequency Constant    Aggravating Factors  did not use heating pad before bed last night. possible weather related.    Pain Relieving Factors  OTC medication. heat    Effect of Pain on Daily Activities moderate effect    Multiple Pain Sites No                [facility] OT Assessment - 12/07/20 1038       Assessment   Medical Diagnosis S/P left reverse total shoulder arthroplasty      Precautions   Precautions Shoulder;Other (comment)    Type of Shoulder Precautions see media tab for protocol.    Precaution Comments History of breast cancer and left side mastectomy                      OT Treatments/Exercises (OP) - 12/07/20 1038       Exercises   Exercises Shoulder      Shoulder Exercises: Supine   Protraction PROM;5 reps;AROM;10 reps    External Rotation PROM;5 reps;AROM;10 reps    Internal Rotation PROM;5 reps;AROM;10 reps   P/ROM: elbow abducted   Flexion PROM;5 reps;AROM;10 reps    ABduction PROM;5 reps      Shoulder Exercises: Standing   Protraction AROM;10 reps    External Rotation AROM;10 reps    Internal Rotation AROM;10 reps    Flexion AROM;10 reps;Limitations    Flexion  Limitations to shoulder level    ABduction AROM;10 reps;Limitations    ABduction Limitations to shoulder level      Manual Therapy   Manual Therapy Myofascial release    Manual therapy comments completed separately from therapeutic exercises    Myofascial Release myofascial release and manual techniques to left upper arm, trapezius, and scapular regions to decrease pain and fascial restrictions and increase joint ROM                    OT Education - 12/07/20 1112     Education Details Updated HEP for A/ROM with the exception of horizontal abduction which is to remain AA/ROM. Specified on handout which exercises to complete supine or standing or either/or.    Person(s) Educated Patient    Methods Explanation;Demonstration;Verbal cues;Handout    Comprehension Returned demonstration;Verbalized understanding              OT Short Term Goals - 11/14/20 1132       OT SHORT TERM GOAL #1   Title Patient will be educated and independent with HEP in order to increase her ability to use her LUE for daily tasks 50% of the time or more.    Time 6    Period Weeks    Target Date 11/25/20      OT SHORT TERM GOAL #2   Title Patient will increase her LUE P/ROM to [facility] in order to increase her ability to complete upper body dressing tasks with less difficulty.    Time 6    Period Weeks    Status On-going      OT SHORT TERM GOAL #3   Title Patient will increase her LUE strength to 3/5 in order to return to using her LUE to fix her hair.    Time 6    Period Weeks    Status On-going      OT SHORT TERM GOAL #4   Title Patient will decrease her LUE fascial restrictions to moderate amount in order to increase the functional mobility needed to complete low level reaching tasks.    Time 6    Period Weeks  OT Long Term Goals - 10/19/20 0835       OT LONG TERM GOAL #1   Title Patient will increase her LUE A/ROM to [facility] in order to be able to complete functional  reaching tasks at or above her shoulder level.    Time 12    Period Weeks    Status On-going      OT LONG TERM GOAL #2   Title Patient will increase her LUE strength to 4/5 to return to lifting household items of moderate weight using her LUE.    Time 12    Period Weeks    Status On-going      OT LONG TERM GOAL #3   Title Pt will report a pain level of approximately 3/10 or less when using her LUE for self care and required daily tasks.    Time 12    Period Weeks    Status On-going      OT LONG TERM GOAL #4   Title Patient will decrease her LUE fascial restrictions to minimal amount or less in order to increase the functional mobility needed to complete all required reaching tasks.    Time 12    Period Weeks    Status On-going                   Plan - 12/07/20 1114     Clinical Impression Statement A: Pt reports increased pain this date which limited tolerance to exercises and ROM. Exercises were modified to accomodate pain level. Pt is now 12 weeks post op and ok to start IR/extension movement which is severely limited. Myofascial release completed to address moderate fascial restrictions. HEP updated. Complete primarily A/ROM supine and standing with modification made due to pain and/or range limitations.    Body Structure / Function / Physical Skills ADL;UE functional use;Pain;ROM;Edema;Strength;Fascial restriction    Plan P: follow up on pain and HEP. Attempt therapy ball circles if able. Low level functional reaching task.    OT Home Exercise Plan eval: table slides; 10/19: scapular A/ROM; 10/25: A/AROM shoulder 12/7: A/ROM (except horizontal abduction. continue completing AA/ROM)    Consulted and Agree with Plan of Care Patient             Patient will benefit from skilled therapeutic intervention in order to improve the following deficits and impairments:   Body Structure / Function / Physical Skills: ADL, UE functional use, Pain, ROM, Edema, Strength, Fascial  restriction       Visit Diagnosis: Stiffness of left shoulder, not elsewhere classified  Acute pain of left shoulder  Other symptoms and signs involving the musculoskeletal system    Problem List Patient Active Problem List   Diagnosis Date Noted   Malignant neoplasm of upper-inner quadrant of left breast in female, estrogen receptor positive ([place]) 03/05/2017   History of colonic polyps 10/31/2015   High cholesterol 10/17/2015   SHOULDER, ARTHRITIS, DEGEN./OSTEO 02/28/2009   CONTRACTURE OF SHOULDER JOINT 02/16/2009   SHOULDER PAIN 02/16/2009   Ailene Ravel, OTR/L,CBIS  [phone]  12/07/2020, 12:35 PM  [place], [place], [address] Phone: [phone]   Fax:  [phone]  Name: Breanna Hill MRN: [id number] Date of Birth: 1940-05-15

## 2020-12-07 NOTE — Patient Instructions (Signed)
Repeat all exercises 10-12 times, 1-2 times per day.  1) Shoulder Protraction (May complete laying down or standing)    Begin with elbows by your side, slowly "punch" straight out in front of you.      2) Shoulder Flexion (Complete either laying down or standing)  Supine:     Standing: (goal: shoulder height)        Begin with arms at your side with thumbs pointed up, slowly raise both arms up and forward towards overhead.      5) Horizontal Abduction/Adduction (Complete laying down)      Straight arms holding cane at shoulder height, bring cane to right, center, left. Repeat starting to left.   Copyright  V[employer]. All rights reserved.         4) Internal & External Rotation (complete either laying down or standing. May also use PVC pipe if wanting to work on stretching more)   Supine:     Standing:     Stand with elbows at the side and elbows bent 90 degrees. Move your forearms away from your body, then bring back inward toward the body.     5) Shoulder Abduction Standing: goal: shoulder height)     Begin with your arms next to your side. Slowly move your arms out to the side so that they go overhead, in a jumping jack or snow angel movement.

## 2020-12-12 ENCOUNTER — Encounter (HOSPITAL_COMMUNITY): Payer: Self-pay

## 2020-12-12 ENCOUNTER — Other Ambulatory Visit: Payer: Self-pay

## 2020-12-12 ENCOUNTER — Ambulatory Visit (HOSPITAL_COMMUNITY): Payer: Medicare HMO

## 2020-12-12 DIAGNOSIS — M25512 Pain in left shoulder: Secondary | ICD-10-CM

## 2020-12-12 DIAGNOSIS — R29898 Other symptoms and signs involving the musculoskeletal system: Secondary | ICD-10-CM | POA: Diagnosis not present

## 2020-12-12 DIAGNOSIS — M25612 Stiffness of left shoulder, not elsewhere classified: Secondary | ICD-10-CM

## 2020-12-12 NOTE — Therapy (Signed)
[place], [place], [address] Phone: [phone]   Fax:  [phone]  Occupational Therapy Treatment  Patient Details  Name: Breanna Hill MRN: [id number] Date of Birth: Jul 27, 1940 Referring Provider (OT): Ophelia Charter, MD   Encounter Date: 12/12/2020   OT End of Session - 12/12/20 1218     Visit Number 18    Number of Visits 24    Date for OT Re-Evaluation 01/06/21    Authorization Type 1) [employer] Medicare HMO 2) [place]    Authorization Time Period 1) $35 copay no visit limit 2) $30 copay no authorization needed.    Progress Note Due on Visit 12    OT Start Time 1115    OT Stop Time 1155    OT Time Calculation (min) 40 min    Activity Tolerance Patient tolerated treatment well    Behavior During Therapy WFL for tasks assessed/performed             Past Medical History:  Diagnosis Date   Arthritis    R hand    Breast cancer ([place])    Breast disorder    cancer   Cancer ([place])    breast CA- diagnosed with needle biopsy   Depression    GERD (gastroesophageal reflux disease)    rare use of tums    High cholesterol 10/17/2015   History of radiation therapy 03/27/17- 04/23/17   Left Breast, 2.67 Gy in 15 fractions for a total dose of 40.05 Gy. Boost, 2 Gy in 5 fractions for a total dose of 10 Gy.    Hypertension    Hypothyroidism    Pre-diabetes    Hgba1C- in the 6 's , per pt., she monitors her diet    Past Surgical History:  Procedure Laterality Date   BACK SURGERY  1973   BREAST LUMPECTOMY Left 02/2017   BREAST LUMPECTOMY WITH RADIOACTIVE SEED AND SENTINEL LYMPH NODE BIOPSY Left 02/27/2017   Procedure: LEFT BREAST LUMPECTOMY WITH RADIOACTIVE SEED AND LEFT AXILLARY SENTINEL LYMPH NODE BIOPSY;  Surgeon: Donnie Mesa, MD;  Location: [facility];  Service: General;  Laterality: Left;   COLONOSCOPY     X 2   COLONOSCOPY N/A 02/22/2016   Procedure: COLONOSCOPY;  Surgeon: Rogene Houston, MD;   [facility];  Service: Endoscopy;  Laterality: N/A;  1:45   EYE SURGERY Bilateral    phlebpheroplasty   Left shoultder surgery     for bone spurs & frozen shoulder    REVERSE SHOULDER ARTHROPLASTY Left 09/14/2020   Procedure: REVERSE SHOULDER ARTHROPLASTY;  Surgeon: Hiram Gash, MD;  [facility];  Service: Orthopedics;  Laterality: Left;   Right bunionectomy     TONSILLECTOMY     TUBAL LIGATION      There were no vitals filed for this visit.   Subjective Assessment - 12/12/20 1121     Subjective  S: My Doctor's appointment was cancelled and rescheduled. I need to let you know when that new appointment is.    Currently in Pain? Yes    Pain Score 2     Pain Location Shoulder    Pain Orientation Left    Pain Descriptors / Indicators Sore    Pain Type Acute pain    Pain Onset Yesterday    Pain Frequency Constant    Pain Relieving Factors OTC medication, Heat    Effect of Pain on Daily Activities min effect  [facility] OT Assessment - 12/12/20 1217       Assessment   Medical Diagnosis S/P left reverse total shoulder arthroplasty      Precautions   Precautions Shoulder;Other (comment)    Type of Shoulder Precautions see media tab for protocol.    Precaution Comments History of breast cancer and left side mastectomy                      OT Treatments/Exercises (OP) - 12/12/20 1137       Exercises   Exercises Shoulder      Shoulder Exercises: Standing   Protraction AROM;10 reps    External Rotation AROM;10 reps    Internal Rotation AROM;10 reps    Flexion AROM;10 reps;Limitations    Flexion Limitations to shoulder level    ABduction AROM;10 reps;Limitations    ABduction Limitations to shoulder level      Shoulder Exercises: Therapy Ball   Right/Left 5 reps   both directions     Shoulder Exercises: ROM/Strengthening   UBE (Upper Arm Bike) Level 1 2' forward 2' reverse pace: 5.0-6.0    Proximal Shoulder Strengthening,  Seated 10X A/ROM      Functional Reaching Activities   Mid Level Functional reaching task completed standing while placing resistive clothespins on vertical pole focusing on proper form and technique.      Manual Therapy   Manual Therapy Myofascial release    Manual therapy comments completed separately from therapeutic exercises    Myofascial Release myofascial release and manual techniques to left upper arm, trapezius, and scapular regions to decrease pain and fascial restrictions and increase joint ROM                      OT Short Term Goals - 11/14/20 1132       OT SHORT TERM GOAL #1   Title Patient will be educated and independent with HEP in order to increase her ability to use her LUE for daily tasks 50% of the time or more.    Time 6    Period Weeks    Target Date 11/25/20      OT SHORT TERM GOAL #2   Title Patient will increase her LUE P/ROM to [facility] in order to increase her ability to complete upper body dressing tasks with less difficulty.    Time 6    Period Weeks    Status On-going      OT SHORT TERM GOAL #3   Title Patient will increase her LUE strength to 3/5 in order to return to using her LUE to fix her hair.    Time 6    Period Weeks    Status On-going      OT SHORT TERM GOAL #4   Title Patient will decrease her LUE fascial restrictions to moderate amount in order to increase the functional mobility needed to complete low level reaching tasks.    Time 6    Period Weeks               OT Long Term Goals - 10/19/20 0835       OT LONG TERM GOAL #1   Title Patient will increase her LUE A/ROM to [facility] in order to be able to complete functional reaching tasks at or above her shoulder level.    Time 12    Period Weeks    Status On-going      OT LONG TERM GOAL #2  Title Patient will increase her LUE strength to 4/5 to return to lifting household items of moderate weight using her LUE.    Time 12    Period Weeks    Status On-going      OT  LONG TERM GOAL #3   Title Pt will report a pain level of approximately 3/10 or less when using her LUE for self care and required daily tasks.    Time 12    Period Weeks    Status On-going      OT LONG TERM GOAL #4   Title Patient will decrease her LUE fascial restrictions to minimal amount or less in order to increase the functional mobility needed to complete all required reaching tasks.    Time 12    Period Weeks    Status On-going                   Plan - 12/12/20 1221     Clinical Impression Statement A: Reports decreased pain from previous session. Experiences pain during passive ROM internal rotation when arm is abducted approximately 45 degrees or more. Movement was modified to increase comfort. Also experiencing pain during horizontal adduction passive. Showing improvement with form and technique during functional reaching although is limited with available range.    Body Structure / Function / Physical Skills ADL;UE functional use;Pain;ROM;Edema;Strength;Fascial restriction    Plan P: 10th visit progress note. FOTO. Contiue working on behind the back movements and functional reaching tasks.    Consulted and Agree with Plan of Care Patient             Patient will benefit from skilled therapeutic intervention in order to improve the following deficits and impairments:   Body Structure / Function / Physical Skills: ADL, UE functional use, Pain, ROM, Edema, Strength, Fascial restriction       Visit Diagnosis: Stiffness of left shoulder, not elsewhere classified  Acute pain of left shoulder  Other symptoms and signs involving the musculoskeletal system    Problem List Patient Active Problem List   Diagnosis Date Noted   Malignant neoplasm of upper-inner quadrant of left breast in female, estrogen receptor positive ([place]) 03/05/2017   History of colonic polyps 10/31/2015   High cholesterol 10/17/2015   SHOULDER, ARTHRITIS, DEGEN./OSTEO 02/28/2009    CONTRACTURE OF SHOULDER JOINT 02/16/2009   SHOULDER PAIN 02/16/2009   Ailene Ravel, OTR/L,CBIS  [phone]  12/12/2020, 12:25 PM  [place] [address] [place], [place], [address] Phone: [phone]   Fax:  [phone]  Name: RUSTI ARIZMENDI MRN: [id number] Date of Birth: 15-Oct-1940

## 2020-12-14 ENCOUNTER — Other Ambulatory Visit: Payer: Self-pay

## 2020-12-14 ENCOUNTER — Ambulatory Visit (HOSPITAL_COMMUNITY): Payer: Medicare HMO

## 2020-12-14 DIAGNOSIS — M25612 Stiffness of left shoulder, not elsewhere classified: Secondary | ICD-10-CM | POA: Diagnosis not present

## 2020-12-14 DIAGNOSIS — M25512 Pain in left shoulder: Secondary | ICD-10-CM

## 2020-12-14 DIAGNOSIS — R29898 Other symptoms and signs involving the musculoskeletal system: Secondary | ICD-10-CM

## 2020-12-14 NOTE — Therapy (Signed)
[facility] [address] [place], [place], [address] Phone: [phone]   Fax:  [phone]  Occupational Therapy Treatment 10th visit progress note Patient Details  Name: Breanna Hill MRN: [id number] Date of Birth: September 10, 1940 Referring Provider (OT): Ophelia Charter, MD  Progress Note Reporting Period 11/09/20 to 12/14/20  See note below for Objective Data and Assessment of Progress/Goals.      Encounter Date: 12/14/2020   OT End of Session - 12/14/20 1107     Visit Number 19    Number of Visits 24    Date for OT Re-Evaluation 01/06/21    Authorization Type 1) [employer] Medicare HMO 2) [place]    Authorization Time Period 1) $35 copay no visit limit 2) $30 copay no authorization needed.    Progress Note Due on Visit 29    OT Start Time 1030    OT Stop Time 1119    OT Time Calculation (min) 49 min    Activity Tolerance Patient tolerated treatment well    Behavior During Therapy WFL for tasks assessed/performed             Past Medical History:  Diagnosis Date   Arthritis    R hand    Breast cancer ([place])    Breast disorder    cancer   Cancer ([place])    breast CA- diagnosed with needle biopsy   Depression    GERD (gastroesophageal reflux disease)    rare use of tums    High cholesterol 10/17/2015   History of radiation therapy 03/27/17- 04/23/17   Left Breast, 2.67 Gy in 15 fractions for a total dose of 40.05 Gy. Boost, 2 Gy in 5 fractions for a total dose of 10 Gy.    Hypertension    Hypothyroidism    Pre-diabetes    Hgba1C- in the 6 's , per pt., she monitors her diet    Past Surgical History:  Procedure Laterality Date   BACK SURGERY  1973   BREAST LUMPECTOMY Left 02/2017   BREAST LUMPECTOMY WITH RADIOACTIVE SEED AND SENTINEL LYMPH NODE BIOPSY Left 02/27/2017   Procedure: LEFT BREAST LUMPECTOMY WITH RADIOACTIVE SEED AND LEFT AXILLARY SENTINEL LYMPH NODE BIOPSY;  Surgeon: Donnie Mesa, MD;  Location: [facility];  Service: General;  Laterality: Left;   COLONOSCOPY     X 2   COLONOSCOPY N/A 02/22/2016   Procedure: COLONOSCOPY;  Surgeon: Rogene Houston, MD;  [facility];  Service: Endoscopy;  Laterality: N/A;  1:45   EYE SURGERY Bilateral    phlebpheroplasty   Left shoultder surgery     for bone spurs & frozen shoulder    REVERSE SHOULDER ARTHROPLASTY Left 09/14/2020   Procedure: REVERSE SHOULDER ARTHROPLASTY;  Surgeon: Hiram Gash, MD;  Location[facility];  Service: Orthopedics;  Laterality: Left;   Right bunionectomy     TONSILLECTOMY     TUBAL LIGATION      There were no vitals filed for this visit.   Subjective Assessment - 12/14/20 1419     Subjective  S: I think my Doctor's appointment is Monday morning.    Currently in Pain? Yes    Pain Score 2     Pain Location Shoulder    Pain Orientation Left    Pain Descriptors / Indicators Sore    Pain Type Acute pain    Pain Onset In the past 7 days    Pain Frequency Constant    Aggravating  Factors  just doing more with it.    Pain Relieving Factors OTC pain medication, heat    Effect of Pain on Daily Activities min effect                [facility] OT Assessment - 12/14/20 1037       Assessment   Medical Diagnosis S/P left reverse total shoulder arthroplasty      Precautions   Precautions Shoulder;Other (comment)    Type of Shoulder Precautions see media tab for protocol.    Precaution Comments History of breast cancer and left side mastectomy      Observation/Other Assessments   Focus on Therapeutic Outcomes (FOTO)  63/100      ROM / Strength   AROM / PROM / Strength AROM;Strength;PROM      Palpation   Palpation comment minimal fascial restrictions noted in left upper arm, upper trapezius and scapularis region.      AROM   Overall AROM Comments Assessed standing. IR/er adducted    AROM Assessment Site Shoulder    Right/Left Shoulder Left    Left Shoulder Flexion 98 Degrees   previous: 85   Left Shoulder  ABduction 99 Degrees   previous: 80   Left Shoulder Internal Rotation 90 Degrees   previous: same   Left Shoulder External Rotation 5 Degrees   previous: 0     PROM   Overall PROM Comments Assessed supine. IR/er adducted    PROM Assessment Site Shoulder    Right/Left Shoulder Left    Left Shoulder Flexion 135 Degrees   previous: 103   Left Shoulder ABduction 120 Degrees   previous: 125   Left Shoulder Internal Rotation 90 Degrees   previous: same   Left Shoulder External Rotation 30 Degrees   previous: 24     Strength   Overall Strength Comments Assessed standing. IR/er adducted. Strength not assessed prior to this date.    Strength Assessment Site Shoulder    Right/Left Shoulder Left    Left Shoulder Flexion 3-/5   previous: same   Left Shoulder ABduction 3-/5   previous: same   Left Shoulder Internal Rotation 4-/5   previous: 3/5   Left Shoulder External Rotation 3-/5   previous: same                     OT Treatments/Exercises (OP) - 12/14/20 1102       Exercises   Exercises Shoulder      Shoulder Exercises: Supine   Protraction PROM;5 reps    Horizontal ABduction PROM;5 reps    External Rotation PROM;5 reps    Internal Rotation PROM;5 reps    Flexion PROM;5 reps    ABduction PROM;5 reps      Shoulder Exercises: Therapy Ball   Right/Left 5 reps   both directions     Shoulder Exercises: ROM/Strengthening   Other ROM/Strengthening Exercises Tennis ball pass behind back and head. 5X each direction.                    OT Education - 12/14/20 1106     Education Details start working on behind the head and behind the back motion using either a tennis ball or an object similar in size.    Person(s) Educated Patient    Methods Explanation    Comprehension Verbalized understanding              OT Short Term Goals - 12/14/20 1058  OT SHORT TERM GOAL #1   Title Patient will be educated and independent with HEP in order to increase her  ability to use her LUE for daily tasks 50% of the time or more.    Time 6    Period Weeks    Target Date 11/25/20      OT SHORT TERM GOAL #2   Title Patient will increase her LUE P/ROM to [facility] in order to increase her ability to complete upper body dressing tasks with less difficulty.    Time 6    Period Weeks    Status Achieved      OT SHORT TERM GOAL #3   Title Patient will increase her LUE strength to 3/5 in order to return to using her LUE to fix her hair.    Time 6    Period Weeks    Status On-going      OT SHORT TERM GOAL #4   Title Patient will decrease her LUE fascial restrictions to moderate amount in order to increase the functional mobility needed to complete low level reaching tasks.    Time 6    Period Weeks               OT Long Term Goals - 12/14/20 1100       OT LONG TERM GOAL #1   Title Patient will increase her LUE A/ROM to [facility] in order to be able to complete functional reaching tasks at or above her shoulder level.    Time 12    Period Weeks    Status On-going      OT LONG TERM GOAL #2   Title Patient will increase her LUE strength to 4/5 to return to lifting household items of moderate weight using her LUE.    Time 12    Period Weeks    Status On-going      OT LONG TERM GOAL #3   Title Pt will report a pain level of approximately 3/10 or less when using her LUE for self care and required daily tasks.    Time 12    Period Weeks    Status On-going      OT LONG TERM GOAL #4   Title Patient will decrease her LUE fascial restrictions to minimal amount or less in order to increase the functional mobility needed to complete all required reaching tasks.    Time 12    Period Weeks    Status Achieved                   Plan - 12/14/20 1422     Clinical Impression Statement A: 10th visit progress note today. Patient has met 3/4 STGs and 1/4 LTGs at this time. She reports that she is progressing with her ability to reach although it remains  difficult when attempting to reach above shoulder level. Reports increased difficulty reaching behind her head to fix her hair. She continues to have deficits with ROM, strength, and pain when attempting to stretch further. Focused session on activities to work on reaching behind her back and head with less difficulty.    Plan P: Follow up on MD appointment. Continue to follow protocol. Progress as able to tolerate. Functional reaching.    Consulted and Agree with Plan of Care Patient             Patient will benefit from skilled therapeutic intervention in order to improve the following deficits and impairments:  Visit Diagnosis: Stiffness of left shoulder, not elsewhere classified  Acute pain of left shoulder  Other symptoms and signs involving the musculoskeletal system    Problem List Patient Active Problem List   Diagnosis Date Noted   Malignant neoplasm of upper-inner quadrant of left breast in female, estrogen receptor positive ([place]) 03/05/2017   History of colonic polyps 10/31/2015   High cholesterol 10/17/2015   SHOULDER, ARTHRITIS, DEGEN./OSTEO 02/28/2009   CONTRACTURE OF SHOULDER JOINT 02/16/2009   SHOULDER PAIN 02/16/2009    Ailene Ravel, OTR/L,CBIS  [phone]  12/14/2020, 2:28 PM  [facility] [place], [place], [address] Phone: [phone]   Fax:  [phone]  Name: Breanna Hill MRN: [id number] Date of Birth: 06/06/1940

## 2020-12-19 ENCOUNTER — Encounter (HOSPITAL_COMMUNITY): Payer: Self-pay | Admitting: Occupational Therapy

## 2020-12-19 ENCOUNTER — Ambulatory Visit (HOSPITAL_COMMUNITY): Payer: Medicare HMO | Admitting: Occupational Therapy

## 2020-12-19 ENCOUNTER — Other Ambulatory Visit: Payer: Self-pay

## 2020-12-19 DIAGNOSIS — R29898 Other symptoms and signs involving the musculoskeletal system: Secondary | ICD-10-CM | POA: Diagnosis not present

## 2020-12-19 DIAGNOSIS — M25512 Pain in left shoulder: Secondary | ICD-10-CM

## 2020-12-19 DIAGNOSIS — M1712 Unilateral primary osteoarthritis, left knee: Secondary | ICD-10-CM | POA: Diagnosis not present

## 2020-12-19 DIAGNOSIS — M25612 Stiffness of left shoulder, not elsewhere classified: Secondary | ICD-10-CM | POA: Diagnosis not present

## 2020-12-19 NOTE — Therapy (Signed)
[facility] [place], [place], [address] Phone: [phone]   Fax:  [phone]  Occupational Therapy Treatment  Patient Details  Name: Breanna Hill MRN: [id number] Date of Birth: 1940/06/18 Referring Provider (OT): Ophelia Charter, MD   Encounter Date: 12/19/2020   OT End of Session - 12/19/20 1646     Visit Number 20    Number of Visits 24    Date for OT Re-Evaluation 01/06/21    Authorization Type 1) [employer] Medicare HMO 2) [place]    Authorization Time Period 1) $35 copay no visit limit 2) $30 copay no authorization needed.    Progress Note Due on Visit 45    OT Start Time 1604    OT Stop Time 1645    OT Time Calculation (min) 41 min    Activity Tolerance Patient tolerated treatment well    Behavior During Therapy WFL for tasks assessed/performed             Past Medical History:  Diagnosis Date   Arthritis    R hand    Breast cancer ([place])    Breast disorder    cancer   Cancer ([place])    breast CA- diagnosed with needle biopsy   Depression    GERD (gastroesophageal reflux disease)    rare use of tums    High cholesterol 10/17/2015   History of radiation therapy 03/27/17- 04/23/17   Left Breast, 2.67 Gy in 15 fractions for a total dose of 40.05 Gy. Boost, 2 Gy in 5 fractions for a total dose of 10 Gy.    Hypertension    Hypothyroidism    Pre-diabetes    Hgba1C- in the 6 's , per pt., she monitors her diet    Past Surgical History:  Procedure Laterality Date   BACK SURGERY  1973   BREAST LUMPECTOMY Left 02/2017   BREAST LUMPECTOMY WITH RADIOACTIVE SEED AND SENTINEL LYMPH NODE BIOPSY Left 02/27/2017   Procedure: LEFT BREAST LUMPECTOMY WITH RADIOACTIVE SEED AND LEFT AXILLARY SENTINEL LYMPH NODE BIOPSY;  Surgeon: Donnie Mesa, MD;  Location: [facility];  Service: General;  Laterality: Left;   COLONOSCOPY     X 2   COLONOSCOPY N/A 02/22/2016   Procedure: COLONOSCOPY;  Surgeon: Rogene Houston, MD;   [facility];  Service: Endoscopy;  Laterality: N/A;  1:45   EYE SURGERY Bilateral    phlebpheroplasty   Left shoultder surgery     for bone spurs & frozen shoulder    REVERSE SHOULDER ARTHROPLASTY Left 09/14/2020   Procedure: REVERSE SHOULDER ARTHROPLASTY;  Surgeon: Hiram Gash, MD;  [facility];  Service: Orthopedics;  Laterality: Left;   Right bunionectomy     TONSILLECTOMY     TUBAL LIGATION      There were no vitals filed for this visit.   Subjective Assessment - 12/19/20 1606     Subjective  S: The PA said the x-rays looked excellent.    Currently in Pain? No/denies                [facility] OT Assessment - 12/19/20 1606       Assessment   Medical Diagnosis S/P left reverse total shoulder arthroplasty      Precautions   Precautions Shoulder;Other (comment)    Type of Shoulder Precautions see media tab for protocol.    Precaution Comments History of breast cancer and left side mastectomy  OT Treatments/Exercises (OP) - 12/19/20 1606       Exercises   Exercises Shoulder      Shoulder Exercises: Supine   Protraction PROM;5 reps;AROM;12 reps    Horizontal ABduction PROM;5 reps;AROM;10 reps    External Rotation PROM;5 reps;AROM;10 reps    Internal Rotation PROM;5 reps;AROM;10 reps    Flexion PROM;5 reps;AROM;10 reps    ABduction PROM;5 reps;AROM;10 reps      Shoulder Exercises: Standing   Protraction AROM;10 reps    Horizontal ABduction AROM;10 reps    External Rotation AROM;10 reps    Internal Rotation AROM;10 reps    Flexion AROM;10 reps;Limitations    Flexion Limitations to shoulder level    ABduction AROM;10 reps;Limitations    ABduction Limitations to shoulder level      Shoulder Exercises: ROM/Strengthening   UBE (Upper Arm Bike) Level 1 2' forward 2' reverse pace: 5.0-6.0    Proximal Shoulder Strengthening, Seated 10X A/ROM    Other ROM/Strengthening Exercises Tennis ball pass behind back and head.  10X      Manual Therapy   Manual Therapy Myofascial release    Manual therapy comments completed separately from therapeutic exercises    Myofascial Release myofascial release and manual techniques to left upper arm, trapezius, and scapular regions to decrease pain and fascial restrictions and increase joint ROM                      OT Short Term Goals - 12/14/20 1058       OT SHORT TERM GOAL #1   Title Patient will be educated and independent with HEP in order to increase her ability to use her LUE for daily tasks 50% of the time or more.    Time 6    Period Weeks    Target Date 11/25/20      OT SHORT TERM GOAL #2   Title Patient will increase her LUE P/ROM to [facility] in order to increase her ability to complete upper body dressing tasks with less difficulty.    Time 6    Period Weeks    Status Achieved      OT SHORT TERM GOAL #3   Title Patient will increase her LUE strength to 3/5 in order to return to using her LUE to fix her hair.    Time 6    Period Weeks    Status On-going      OT SHORT TERM GOAL #4   Title Patient will decrease her LUE fascial restrictions to moderate amount in order to increase the functional mobility needed to complete low level reaching tasks.    Time 6    Period Weeks               OT Long Term Goals - 12/14/20 1100       OT LONG TERM GOAL #1   Title Patient will increase her LUE A/ROM to [facility] in order to be able to complete functional reaching tasks at or above her shoulder level.    Time 12    Period Weeks    Status On-going      OT LONG TERM GOAL #2   Title Patient will increase her LUE strength to 4/5 to return to lifting household items of moderate weight using her LUE.    Time 12    Period Weeks    Status On-going      OT LONG TERM GOAL #3   Title Pt will report a pain  level of approximately 3/10 or less when using her LUE for self care and required daily tasks.    Time 12    Period Weeks    Status On-going       OT LONG TERM GOAL #4   Title Patient will decrease her LUE fascial restrictions to minimal amount or less in order to increase the functional mobility needed to complete all required reaching tasks.    Time 12    Period Weeks    Status Achieved                   Plan - 12/19/20 1640     Clinical Impression Statement A: Pt reports she saw the PA and they took x-rays. Continued with myofascial release and passive stretching, pt with tight joint feel at 50% ROM. Continued with A/ROM and ball pass, x to v arms. Verbal cuing for form and technique.    Body Structure / Function / Physical Skills ADL;UE functional use;Pain;ROM;Edema;Strength;Fascial restriction    Plan P:  Progress as able to tolerate. Functional reaching task    OT Home Exercise Plan eval: table slides; 10/19: scapular A/ROM; 10/25: A/AROM shoulder 12/7: A/ROM (except horizontal abduction. continue completing AA/ROM)    Consulted and Agree with Plan of Care Patient             Patient will benefit from skilled therapeutic intervention in order to improve the following deficits and impairments:   Body Structure / Function / Physical Skills: ADL, UE functional use, Pain, ROM, Edema, Strength, Fascial restriction       Visit Diagnosis: Stiffness of left shoulder, not elsewhere classified  Acute pain of left shoulder  Other symptoms and signs involving the musculoskeletal system    Problem List Patient Active Problem List   Diagnosis Date Noted   Malignant neoplasm of upper-inner quadrant of left breast in female, estrogen receptor positive ([place]) 03/05/2017   History of colonic polyps 10/31/2015   High cholesterol 10/17/2015   SHOULDER, ARTHRITIS, DEGEN./OSTEO 02/28/2009   CONTRACTURE OF SHOULDER JOINT 02/16/2009   SHOULDER PAIN 02/16/2009    Guadelupe Sabin, OTR/L  [phone] 12/19/2020, 4:46 PM  [place], [place],  [address] Phone: [phone]   Fax:  [phone]  Name: KARINNE SCHMADER MRN: [id number] Date of Birth: May 30, 1940

## 2020-12-21 ENCOUNTER — Ambulatory Visit (HOSPITAL_COMMUNITY): Payer: Medicare HMO

## 2020-12-21 ENCOUNTER — Other Ambulatory Visit: Payer: Self-pay

## 2020-12-21 ENCOUNTER — Encounter (HOSPITAL_COMMUNITY): Payer: Self-pay

## 2020-12-21 DIAGNOSIS — M25512 Pain in left shoulder: Secondary | ICD-10-CM | POA: Diagnosis not present

## 2020-12-21 DIAGNOSIS — R29898 Other symptoms and signs involving the musculoskeletal system: Secondary | ICD-10-CM | POA: Diagnosis not present

## 2020-12-21 DIAGNOSIS — M25612 Stiffness of left shoulder, not elsewhere classified: Secondary | ICD-10-CM

## 2020-12-21 NOTE — Therapy (Signed)
[place], [place], [address] Phone: [phone]   Fax:  [phone]  Occupational Therapy Treatment  Patient Details  Name: Breanna Hill MRN: [id number] Date of Birth: 05/11/1940 Referring Provider (OT): Ophelia Charter, MD   Encounter Date: 12/21/2020   OT End of Session - 12/21/20 1242     Visit Number 21    Number of Visits 24    Date for OT Re-Evaluation 01/06/21    Authorization Type 1) [employer]na Medicare HMO 2) [place]    Authorization Time Period 1) $35 copay no visit limit 2) $30 copay no authorization needed.    Progress Note Due on Visit 88    OT Start Time 1035   late check in   OT Stop Time 1113    OT Time Calculation (min) 38 min    Activity Tolerance Patient tolerated treatment well    Behavior During Therapy WFL for tasks assessed/performed             Past Medical History:  Diagnosis Date   Arthritis    R hand    Breast cancer ([place])    Breast disorder    cancer   Cancer ([place])    breast CA- diagnosed with needle biopsy   Depression    GERD (gastroesophageal reflux disease)    rare use of tums    High cholesterol 10/17/2015   History of radiation therapy 03/27/17- 04/23/17   Left Breast, 2.67 Gy in 15 fractions for a total dose of 40.05 Gy. Boost, 2 Gy in 5 fractions for a total dose of 10 Gy.    Hypertension    Hypothyroidism    Pre-diabetes    Hgba1C- in the 6 's , per pt., she monitors her diet    Past Surgical History:  Procedure Laterality Date   BACK SURGERY  1973   BREAST LUMPECTOMY Left 02/2017   BREAST LUMPECTOMY WITH RADIOACTIVE SEED AND SENTINEL LYMPH NODE BIOPSY Left 02/27/2017   Procedure: LEFT BREAST LUMPECTOMY WITH RADIOACTIVE SEED AND LEFT AXILLARY SENTINEL LYMPH NODE BIOPSY;  Surgeon: Donnie Mesa, MD;  Location: [facility];  Service: General;  Laterality: Left;   COLONOSCOPY     X 2   COLONOSCOPY N/A 02/22/2016   Procedure: COLONOSCOPY;  Surgeon: Rogene Houston, MD;  [facility];  Service: Endoscopy;  Laterality: N/A;  1:45   EYE SURGERY Bilateral    phlebpheroplasty   Left shoultder surgery     for bone spurs & frozen shoulder    REVERSE SHOULDER ARTHROPLASTY Left 09/14/2020   Procedure: REVERSE SHOULDER ARTHROPLASTY;  Surgeon: Hiram Gash, MD;  [facility];  Service: Orthopedics;  Laterality: Left;   Right bunionectomy     TONSILLECTOMY     TUBAL LIGATION      There were no vitals filed for this visit.   Subjective Assessment - 12/21/20 1049     Subjective  S: I've been doing a lot of reaching today. Cutting when [employer] is hard because my table is higher than normal.    Currently in Pain? Yes    Pain Score 4     Pain Location Shoulder    Pain Orientation Left    Pain Descriptors / Indicators Sore    Pain Type Acute pain    Pain Onset Today    Pain Frequency Constant    Aggravating Factors  doing more with it.    Pain Relieving Factors  OTC pain medication, heat    Effect of Pain on Daily Activities moderate effect    Multiple Pain Sites No                [facility] OT Assessment - 12/21/20 1051       Assessment   Medical Diagnosis S/P left reverse total shoulder arthroplasty      Precautions   Precautions Shoulder;Other (comment)    Type of Shoulder Precautions see media tab for protocol.    Precaution Comments History of breast cancer and left side mastectomy                      OT Treatments/Exercises (OP) - 12/21/20 1051       Exercises   Exercises Shoulder      Shoulder Exercises: Supine   Protraction PROM;5 reps;AROM;12 reps    Horizontal ABduction PROM;5 reps;AROM;10 reps    External Rotation PROM;5 reps;AROM;10 reps    Internal Rotation PROM;5 reps;AROM;10 reps    Flexion PROM;5 reps;AROM;10 reps    ABduction PROM;5 reps;AROM;10 reps      Shoulder Exercises: ROM/Strengthening   UBE (Upper Arm Bike) Level 1 2' forward 2' reverse pace: 5.5-6.0    Other  ROM/Strengthening Exercises Yellow putty used while seated (table level identical to patient's home table). Flattened putty using both hands prior to using pvc pipe to press circles into putty. Completed almost entire border of putty.                      OT Short Term Goals - 12/21/20 1052       OT SHORT TERM GOAL #1   Title Patient will be educated and independent with HEP in order to increase her ability to use her LUE for daily tasks 50% of the time or more.    Time 6    Period Weeks    Target Date 11/25/20      OT SHORT TERM GOAL #2   Title Patient will increase her LUE P/ROM to [facility] in order to increase her ability to complete upper body dressing tasks with less difficulty.    Time 6    Period Weeks      OT SHORT TERM GOAL #3   Title Patient will increase her LUE strength to 3/5 in order to return to using her LUE to fix her hair.    Time 6    Period Weeks    Status On-going      OT SHORT TERM GOAL #4   Title Patient will decrease her LUE fascial restrictions to moderate amount in order to increase the functional mobility needed to complete low level reaching tasks.    Time 6    Period Weeks               OT Long Term Goals - 12/21/20 1052       OT LONG TERM GOAL #1   Title Patient will increase her LUE A/ROM to [facility] in order to be able to complete functional reaching tasks at or above her shoulder level.    Time 12    Period Weeks    Status On-going      OT LONG TERM GOAL #2   Title Patient will increase her LUE strength to 4/5 to return to lifting household items of moderate weight using her LUE.    Time 12    Period Weeks    Status On-going  OT LONG TERM GOAL #3   Title Pt will report a pain level of approximately 3/10 or less when using her LUE for self care and required daily tasks.    Time 12    Period Weeks    Status On-going      OT LONG TERM GOAL #4   Title Patient will decrease her LUE fascial restrictions to minimal amount or  less in order to increase the functional mobility needed to complete all required reaching tasks.    Time 12    Period Weeks                   Plan - 12/21/20 1243     Clinical Impression Statement A: Increased pain reported this date. patient states that she did not take something before therapy as she usually does. Tolerance level was lower during passive stretching and exercises. Did focus on some shoulder strengthening seated with use of yellow putty to increase ability to cut food items when meal prepping. Patient presented with moderate to max difficulty due to pain and weakness. Limited time spent with task. VC for form and technique were provided.    Body Structure / Function / Physical Skills ADL;UE functional use;Pain;ROM;Edema;Strength;Fascial restriction    Plan P:  Progress as able to tolerate. Functional reaching task    Consulted and Agree with Plan of Care Patient             Patient will benefit from skilled therapeutic intervention in order to improve the following deficits and impairments:   Body Structure / Function / Physical Skills: ADL, UE functional use, Pain, ROM, Edema, Strength, Fascial restriction       Visit Diagnosis: Stiffness of left shoulder, not elsewhere classified  Acute pain of left shoulder  Other symptoms and signs involving the musculoskeletal system    Problem List Patient Active Problem List   Diagnosis Date Noted   Malignant neoplasm of upper-inner quadrant of left breast in female, estrogen receptor positive ([place]) 03/05/2017   History of colonic polyps 10/31/2015   High cholesterol 10/17/2015   SHOULDER, ARTHRITIS, DEGEN./OSTEO 02/28/2009   CONTRACTURE OF SHOULDER JOINT 02/16/2009   SHOULDER PAIN 02/16/2009   Ailene Ravel, OTR/L,CBIS  [phone]  12/21/2020, 12:46 PM  [place], [place], [address] Phone: [phone]   Fax:   [phone]  Name: Breanna Hill MRN: [id number] Date of Birth: 1940-05-14

## 2020-12-27 ENCOUNTER — Other Ambulatory Visit: Payer: Self-pay | Admitting: Hematology and Oncology

## 2020-12-28 ENCOUNTER — Encounter (HOSPITAL_COMMUNITY): Payer: Self-pay | Admitting: Occupational Therapy

## 2020-12-28 ENCOUNTER — Other Ambulatory Visit: Payer: Self-pay

## 2020-12-28 ENCOUNTER — Ambulatory Visit (HOSPITAL_COMMUNITY): Payer: Medicare HMO | Admitting: Occupational Therapy

## 2020-12-28 DIAGNOSIS — M25512 Pain in left shoulder: Secondary | ICD-10-CM

## 2020-12-28 DIAGNOSIS — M25612 Stiffness of left shoulder, not elsewhere classified: Secondary | ICD-10-CM | POA: Diagnosis not present

## 2020-12-28 DIAGNOSIS — R29898 Other symptoms and signs involving the musculoskeletal system: Secondary | ICD-10-CM

## 2020-12-28 NOTE — Therapy (Signed)
[place], [place], [address] Phone: [phone]   Fax:  [phone]  Occupational Therapy Treatment  Patient Details  Name: Breanna Hill MRN: [id number] Date of Birth: 11-May-1940 Referring Provider (OT): Ophelia Charter, MD   Encounter Date: 12/28/2020   OT End of Session - 12/28/20 1338     Visit Number 22    Number of Visits 24    Date for OT Re-Evaluation 01/06/21    Authorization Type 1) Aetna Medicare HMO 2) [place]    Authorization Time Period 1) $35 copay no visit limit 2) $30 copay no authorization needed.    Progress Note Due on Visit 29    OT Start Time 1258    OT Stop Time 1341    OT Time Calculation (min) 43 min    Activity Tolerance Patient tolerated treatment well    Behavior During Therapy WFL for tasks assessed/performed             Past Medical History:  Diagnosis Date   Arthritis    R hand    Breast cancer ([place])    Breast disorder    cancer   Cancer ([place])    breast CA- diagnosed with needle biopsy   Depression    GERD (gastroesophageal reflux disease)    rare use of tums    High cholesterol 10/17/2015   History of radiation therapy 03/27/17- 04/23/17   Left Breast, 2.67 Gy in 15 fractions for a total dose of 40.05 Gy. Boost, 2 Gy in 5 fractions for a total dose of 10 Gy.    Hypertension    Hypothyroidism    Pre-diabetes    Hgba1C- in the 6 's , per pt., she monitors her diet    Past Surgical History:  Procedure Laterality Date   BACK SURGERY  1973   BREAST LUMPECTOMY Left 02/2017   BREAST LUMPECTOMY WITH RADIOACTIVE SEED AND SENTINEL LYMPH NODE BIOPSY Left 02/27/2017   Procedure: LEFT BREAST LUMPECTOMY WITH RADIOACTIVE SEED AND LEFT AXILLARY SENTINEL LYMPH NODE BIOPSY;  Surgeon: Donnie Mesa, MD;  Location: [facility];  Service: General;  Laterality: Left;   COLONOSCOPY     X 2   COLONOSCOPY N/A 02/22/2016   Procedure: COLONOSCOPY;  Surgeon: Rogene Houston, MD;   [facility];  Service: Endoscopy;  Laterality: N/A;  1:45   EYE SURGERY Bilateral    phlebpheroplasty   Left shoultder surgery     for bone spurs & frozen shoulder    REVERSE SHOULDER ARTHROPLASTY Left 09/14/2020   Procedure: REVERSE SHOULDER ARTHROPLASTY;  Surgeon: Hiram Gash, MD;  [facility];  Service: Orthopedics;  Laterality: Left;   Right bunionectomy     TONSILLECTOMY     TUBAL LIGATION      There were no vitals filed for this visit.   Subjective Assessment - 12/28/20 1255     Subjective  S: I've been sleeping with a heating pad on and it's helping.    Currently in Pain? Yes    Pain Score 3     Pain Location Shoulder    Pain Orientation Left    Pain Descriptors / Indicators Aching;Sore    Pain Type Acute pain    Pain Radiating Towards None    Pain Onset Today    Pain Frequency Constant    Aggravating Factors  increased use    Pain Relieving Factors heat, OTC pain medication    Effect of  Pain on Daily Activities mod effect on ADLs    Multiple Pain Sites No                [facility] OT Assessment - 12/28/20 1254       Assessment   Medical Diagnosis S/P left reverse total shoulder arthroplasty      Precautions   Precautions Shoulder;Other (comment)    Type of Shoulder Precautions see media tab for protocol.    Precaution Comments History of breast cancer and left side mastectomy                      OT Treatments/Exercises (OP) - 12/28/20 1300       Exercises   Exercises Shoulder      Shoulder Exercises: Supine   Protraction PROM;5 reps;AROM;12 reps    Horizontal ABduction PROM;5 reps;AROM;12 reps    External Rotation PROM;5 reps;AROM;12 reps    Internal Rotation PROM;5 reps;AROM;12 reps    Flexion PROM;5 reps;AROM;12 reps    ABduction PROM;5 reps;AROM;12 reps      Shoulder Exercises: Standing   Extension Theraband;10 reps    Theraband Level (Shoulder Extension) Level 2 (Red)    Row Theraband;12 reps    Theraband Level  (Shoulder Row) Level 2 (Red)      Shoulder Exercises: ROM/Strengthening   UBE (Upper Arm Bike) Level 1 2' forward 2' reverse pace: 5.5-6.0    Proximal Shoulder Strengthening, Supine 10X each, no rest breaks      Functional Reaching Activities   Mid Level Pt placing cones on bottom shelf of overhead cabinet in flexion. Then pt removing in abduction. Completed for 10 cones      Manual Therapy   Manual Therapy Myofascial release    Manual therapy comments completed separately from therapeutic exercises    Myofascial Release myofascial release and manual techniques to left upper arm, trapezius, and scapular regions to decrease pain and fascial restrictions and increase joint ROM                      OT Short Term Goals - 12/21/20 1052       OT SHORT TERM GOAL #1   Title Patient will be educated and independent with HEP in order to increase her ability to use her LUE for daily tasks 50% of the time or more.    Time 6    Period Weeks    Target Date 11/25/20      OT SHORT TERM GOAL #2   Title Patient will increase her LUE P/ROM to [facility] in order to increase her ability to complete upper body dressing tasks with less difficulty.    Time 6    Period Weeks      OT SHORT TERM GOAL #3   Title Patient will increase her LUE strength to 3/5 in order to return to using her LUE to fix her hair.    Time 6    Period Weeks    Status On-going      OT SHORT TERM GOAL #4   Title Patient will decrease her LUE fascial restrictions to moderate amount in order to increase the functional mobility needed to complete low level reaching tasks.    Time 6    Period Weeks               OT Long Term Goals - 12/21/20 1052       OT LONG TERM GOAL #1   Title Patient will  increase her LUE A/ROM to [facility] in order to be able to complete functional reaching tasks at or above her shoulder level.    Time 12    Period Weeks    Status On-going      OT LONG TERM GOAL #2   Title Patient will  increase her LUE strength to 4/5 to return to lifting household items of moderate weight using her LUE.    Time 12    Period Weeks    Status On-going      OT LONG TERM GOAL #3   Title Pt will report a pain level of approximately 3/10 or less when using her LUE for self care and required daily tasks.    Time 12    Period Weeks    Status On-going      OT LONG TERM GOAL #4   Title Patient will decrease her LUE fascial restrictions to minimal amount or less in order to increase the functional mobility needed to complete all required reaching tasks.    Time 12    Period Weeks                   Plan - 12/28/20 1336     Clinical Impression Statement A: Pt with improved pain from last week. Continued with myofascial release and pt with increased tightness along upper arm, anterior shoulder, and trapeizus today. Continued with A/ROM and resumed scapular theraband, added functional reaching tasks with cones. Pt able to achieve slightly greater than 50% ROM during A/ROM and reaching activities. Verbal cuing for form and technique.    Body Structure / Function / Physical Skills ADL;UE functional use;Pain;ROM;Edema;Strength;Fascial restriction    Plan P: Continue working towards improved ROM, complete functional reaching, trial overhead lacing    OT Home Exercise Plan eval: table slides; 10/19: scapular A/ROM; 10/25: A/AROM shoulder 12/7: A/ROM (except horizontal abduction. continue completing AA/ROM)    Consulted and Agree with Plan of Care Patient             Patient will benefit from skilled therapeutic intervention in order to improve the following deficits and impairments:   Body Structure / Function / Physical Skills: ADL, UE functional use, Pain, ROM, Edema, Strength, Fascial restriction       Visit Diagnosis: Stiffness of left shoulder, not elsewhere classified  Acute pain of left shoulder  Other symptoms and signs involving the musculoskeletal system    Problem  List Patient Active Problem List   Diagnosis Date Noted   Malignant neoplasm of upper-inner quadrant of left breast in female, estrogen receptor positive ([place]) 03/05/2017   History of colonic polyps 10/31/2015   High cholesterol 10/17/2015   SHOULDER, ARTHRITIS, DEGEN./OSTEO 02/28/2009   CONTRACTURE OF SHOULDER JOINT 02/16/2009   SHOULDER PAIN 02/16/2009    Guadelupe Sabin, OTR/L  [phone] 12/28/2020, 1:42 PM  [place], [place], [address] Phone: [phone]   Fax:  [phone]  Name: Breanna Hill MRN: [id number] Date of Birth: February 21, 1940

## 2020-12-30 ENCOUNTER — Ambulatory Visit (HOSPITAL_COMMUNITY): Payer: Medicare HMO | Admitting: Occupational Therapy

## 2020-12-30 ENCOUNTER — Encounter (HOSPITAL_COMMUNITY): Payer: Self-pay | Admitting: Occupational Therapy

## 2020-12-30 ENCOUNTER — Other Ambulatory Visit: Payer: Self-pay

## 2020-12-30 DIAGNOSIS — M25512 Pain in left shoulder: Secondary | ICD-10-CM | POA: Diagnosis not present

## 2020-12-30 DIAGNOSIS — R29898 Other symptoms and signs involving the musculoskeletal system: Secondary | ICD-10-CM | POA: Diagnosis not present

## 2020-12-30 DIAGNOSIS — M25612 Stiffness of left shoulder, not elsewhere classified: Secondary | ICD-10-CM | POA: Diagnosis not present

## 2020-12-30 NOTE — Therapy (Signed)
[place], [place], [address] Phone: [phone]   Fax:  [phone]  Occupational Therapy Treatment  Patient Details  Name: Breanna Hill MRN: [id number] Date of Birth: [birth year] Referring Provider (OT): Ophelia Charter, MD   Encounter Date: 12/30/2020   OT End of Session - 12/30/20 1114     Visit Number 23    Number of Visits 24    Date for OT Re-Evaluation 01/06/21    Authorization Type 1) Aetna Medicare HMO 2) [place]    Authorization Time Period 1) $35 copay no visit limit 2) $30 copay no authorization needed.    Progress Note Due on Visit 50    OT Start Time 1040   pt arrived late   OT Stop Time 1112    OT Time Calculation (min) 32 min    Activity Tolerance Patient tolerated treatment well    Behavior During Therapy WFL for tasks assessed/performed             Past Medical History:  Diagnosis Date   Arthritis    R hand    Breast cancer ([place])    Breast disorder    cancer   Cancer ([place])    breast CA- diagnosed with needle biopsy   Depression    GERD (gastroesophageal reflux disease)    rare use of tums    High cholesterol 10/17/2015   History of radiation therapy 03/27/17- 04/23/17   Left Breast, 2.67 Gy in 15 fractions for a total dose of 40.05 Gy. Boost, 2 Gy in 5 fractions for a total dose of 10 Gy.    Hypertension    Hypothyroidism    Pre-diabetes    Hgba1C- in the 6 's , per pt., she monitors her diet    Past Surgical History:  Procedure Laterality Date   BACK SURGERY  1973   BREAST LUMPECTOMY Left 02/2017   BREAST LUMPECTOMY WITH RADIOACTIVE SEED AND SENTINEL LYMPH NODE BIOPSY Left 02/27/2017   Procedure: LEFT BREAST LUMPECTOMY WITH RADIOACTIVE SEED AND LEFT AXILLARY SENTINEL LYMPH NODE BIOPSY;  Surgeon: Donnie Mesa, MD;  Location: [facility];  Service: General;  Laterality: Left;   COLONOSCOPY     X 2   COLONOSCOPY N/A 02/22/2016   Procedure: COLONOSCOPY;  Surgeon:  Rogene Houston, MD;  [facility];  Service: Endoscopy;  Laterality: N/A;  1:45   EYE SURGERY Bilateral    phlebpheroplasty   Left shoultder surgery     for bone spurs & frozen shoulder    REVERSE SHOULDER ARTHROPLASTY Left 09/14/2020   Procedure: REVERSE SHOULDER ARTHROPLASTY;  Surgeon: Hiram Gash, MD;  [facility];  Service: Orthopedics;  Laterality: Left;   Right bunionectomy     TONSILLECTOMY     TUBAL LIGATION      There were no vitals filed for this visit.   Subjective Assessment - 12/30/20 1042     Subjective  S: It's feeling better.    Currently in Pain? No/denies                [facility] OT Assessment - 12/30/20 1041       Assessment   Medical Diagnosis S/P left reverse total shoulder arthroplasty      Precautions   Precautions Shoulder;Other (comment)    Type of Shoulder Precautions see media tab for protocol.    Precaution Comments History of breast cancer and left side mastectomy  OT Treatments/Exercises (OP) - 12/30/20 1042       Exercises   Exercises Shoulder      Shoulder Exercises: Supine   Protraction PROM;5 reps    Horizontal ABduction PROM;5 reps    External Rotation PROM;5 reps    Internal Rotation PROM;5 reps    Flexion PROM;5 reps    ABduction PROM;5 reps      Shoulder Exercises: Standing   Protraction AROM;10 reps    Horizontal ABduction AROM;10 reps    External Rotation AROM;10 reps    Internal Rotation AROM;10 reps    Flexion AROM;10 reps;Limitations    Flexion Limitations to shoulder level    ABduction AROM;10 reps;Limitations    ABduction Limitations to shoulder level      Shoulder Exercises: Stretch   Cross Chest Stretch 3 reps;10 seconds    Internal Rotation Stretch 3 reps   10" holds with horizontal towel, very gentle   External Rotation Stretch 3 reps;10 seconds    Wall Stretch - Flexion 3 reps;10 seconds    Wall Stretch - ABduction 3 reps;10 seconds    Other Shoulder  Stretches doorway stretch, 3x10"      Manual Therapy   Manual Therapy Myofascial release    Manual therapy comments completed separately from therapeutic exercises    Myofascial Release myofascial release and manual techniques to left upper arm, trapezius, and scapular regions to decrease pain and fascial restrictions and increase joint ROM                    OT Education - 12/30/20 1101     Education Details shoulder stretches    Person(s) Educated Patient    Methods Explanation;Demonstration;Verbal cues;Handout    Comprehension Verbalized understanding;Returned demonstration              OT Short Term Goals - 12/21/20 1052       OT SHORT TERM GOAL #1   Title Patient will be educated and independent with HEP in order to increase her ability to use her LUE for daily tasks 50% of the time or more.    Time 6    Period Weeks    Target Date 11/25/20      OT SHORT TERM GOAL #2   Title Patient will increase her LUE P/ROM to [facility] in order to increase her ability to complete upper body dressing tasks with less difficulty.    Time 6    Period Weeks      OT SHORT TERM GOAL #3   Title Patient will increase her LUE strength to 3/5 in order to return to using her LUE to fix her hair.    Time 6    Period Weeks    Status On-going      OT SHORT TERM GOAL #4   Title Patient will decrease her LUE fascial restrictions to moderate amount in order to increase the functional mobility needed to complete low level reaching tasks.    Time 6    Period Weeks               OT Long Term Goals - 12/21/20 1052       OT LONG TERM GOAL #1   Title Patient will increase her LUE A/ROM to [facility] in order to be able to complete functional reaching tasks at or above her shoulder level.    Time 12    Period Weeks    Status On-going      OT LONG TERM GOAL #  2   Title Patient will increase her LUE strength to 4/5 to return to lifting household items of moderate weight using her LUE.     Time 12    Period Weeks    Status On-going      OT LONG TERM GOAL #3   Title Pt will report a pain level of approximately 3/10 or less when using her LUE for self care and required daily tasks.    Time 12    Period Weeks    Status On-going      OT LONG TERM GOAL #4   Title Patient will decrease her LUE fascial restrictions to minimal amount or less in order to increase the functional mobility needed to complete all required reaching tasks.    Time 12    Period Weeks                   Plan - 12/30/20 1115     Clinical Impression Statement A: Pt with no pain today, continued with myofascial release to address fascial restrictions, passive stretching completed. Pt completing A/ROM in standing, added shoulder stretches today and updated HEP. Discussed benefit of sustained stretch versus just doing ROM. Pt reports she feels a good stretch with the new exercises. Verbal cuing for form and technique.    Body Structure / Function / Physical Skills ADL;UE functional use;Pain;ROM;Edema;Strength;Fascial restriction    Plan P: follow up on HEP, Reassessment, determine if ready for discharge with HEP or if additional visits warranted. complete FOTO    OT Home Exercise Plan eval: table slides; 10/19: scapular A/ROM; 10/25: A/AROM shoulder 12/7: A/ROM (except horizontal abduction. continue completing AA/ROM); 12/30: shoulder stretches    Consulted and Agree with Plan of Care Patient             Patient will benefit from skilled therapeutic intervention in order to improve the following deficits and impairments:   Body Structure / Function / Physical Skills: ADL, UE functional use, Pain, ROM, Edema, Strength, Fascial restriction       Visit Diagnosis: Stiffness of left shoulder, not elsewhere classified  Acute pain of left shoulder  Other symptoms and signs involving the musculoskeletal system    Problem List Patient Active Problem List   Diagnosis Date Noted   Malignant  neoplasm of upper-inner quadrant of left breast in female, estrogen receptor positive ([place]) 03/05/2017   History of colonic polyps 10/31/2015   High cholesterol 10/17/2015   SHOULDER, ARTHRITIS, DEGEN./OSTEO 02/28/2009   CONTRACTURE OF SHOULDER JOINT 02/16/2009   SHOULDER PAIN 02/16/2009    Guadelupe Sabin, OTR/L  [phone] 12/30/2020, 11:17 AM  [place], [place], [address] Phone: [phone]   Fax:  [phone]  Name: Breanna Hill MRN: [id number] Date of Birth: 1940-10-10

## 2020-12-30 NOTE — Patient Instructions (Signed)
°  1) Flexion Wall Stretch    Face wall, place affected handon wall in front of you. Slide hand up the wall  and lean body in towards the wall. Hold for 10 seconds. Repeat 3-5 times. 2-3 times/day.     2) Towel Stretch with Internal Rotation        Gently pull up (or to the side) your affected arm  behind your back with the assist of a towel. Hold 10 seconds, repeat 3-5 times. 2-3 times/day.             3) Corner Stretch    Stand at a corner of a wall, place your arms on the walls with elbows bent. Lean into the corner until a stretch is felt along the front of your chest and/or shoulders. Hold for 10 seconds. Repeat 3-5X, 2-3 times/day.    4) Posterior Capsule Stretch    Bring the involved arm across chest. Grasp elbow and pull toward chest until you feel a stretch in the back of the upper arm and shoulder. Hold 10 seconds. Repeat 3-5X. Complete 2-3 times/day.      5) External Rotation Stretch:     Place your affected hand on the wall with the elbow bent and gently turn your body the opposite direction until a stretch is felt. Hold 10 seconds, repeat 3-5X. Complete 2-3 times/day.    6) Abduction stretch:    Stand beside the wall and slide arm up the wall by your side. Hold for 10 seconds, repeat 3-5X. Complete 2-3x/day.

## 2021-01-04 ENCOUNTER — Encounter (HOSPITAL_COMMUNITY): Payer: Self-pay

## 2021-01-04 ENCOUNTER — Ambulatory Visit (HOSPITAL_COMMUNITY): Payer: Medicare HMO | Attending: Orthopaedic Surgery

## 2021-01-04 ENCOUNTER — Other Ambulatory Visit: Payer: Self-pay

## 2021-01-04 DIAGNOSIS — M25512 Pain in left shoulder: Secondary | ICD-10-CM | POA: Diagnosis not present

## 2021-01-04 DIAGNOSIS — M25612 Stiffness of left shoulder, not elsewhere classified: Secondary | ICD-10-CM | POA: Diagnosis not present

## 2021-01-04 DIAGNOSIS — R29898 Other symptoms and signs involving the musculoskeletal system: Secondary | ICD-10-CM | POA: Diagnosis not present

## 2021-01-04 NOTE — Patient Instructions (Signed)
Complete the following exercises 2-3 times a day.  Doorway Stretch  Place each hand opposite each other on the doorway. *Make sure elbows stay down. No chicken wings.* Hold this start position. Do not attempt to step forward at this time.  Hold for _20-30___ seconds. Repeat __2__times.

## 2021-01-04 NOTE — Therapy (Signed)
[place], [place], [address] Phone: [phone]   Fax:  [phone]  Occupational Therapy Treatment Reassessment/re-cert Patient Details  Name: Breanna Hill MRN: [id number] Date of Birth: [birth year] Referring Provider (OT): Ophelia Charter, MD   Encounter Date: 01/04/2021   OT End of Session - 01/04/21 1058     Visit Number 24    Number of Visits 32    Date for OT Re-Evaluation 02/01/21    Authorization Type 1) [employer] Medicare HMO 2) [place]    Authorization Time Period 1) $35 copay no visit limit 2) $30 copay no authorization needed.    Progress Note Due on Visit 29    OT Start Time 1030   reassessment   OT Stop Time 1108    OT Time Calculation (min) 38 min    Activity Tolerance Patient tolerated treatment well    Behavior During Therapy WFL for tasks assessed/performed             Past Medical History:  Diagnosis Date   Arthritis    R hand    Breast cancer ([place])    Breast disorder    cancer   Cancer ([place])    breast CA- diagnosed with needle biopsy   Depression    GERD (gastroesophageal reflux disease)    rare use of tums    High cholesterol 10/17/2015   History of radiation therapy 03/27/17- 04/23/17   Left Breast, 2.67 Gy in 15 fractions for a total dose of 40.05 Gy. Boost, 2 Gy in 5 fractions for a total dose of 10 Gy.    Hypertension    Hypothyroidism    Pre-diabetes    Hgba1C- in the 6 's , per pt., she monitors her diet    Past Surgical History:  Procedure Laterality Date   BACK SURGERY  1973   BREAST LUMPECTOMY Left 02/2017   BREAST LUMPECTOMY WITH RADIOACTIVE SEED AND SENTINEL LYMPH NODE BIOPSY Left 02/27/2017   Procedure: LEFT BREAST LUMPECTOMY WITH RADIOACTIVE SEED AND LEFT AXILLARY SENTINEL LYMPH NODE BIOPSY;  Surgeon: Donnie Mesa, MD;  Location: [facility];  Service: General;  Laterality: Left;   COLONOSCOPY     X 2   COLONOSCOPY N/A 02/22/2016   Procedure: COLONOSCOPY;   Surgeon: Rogene Houston, MD;  [facility];  Service: Endoscopy;  Laterality: N/A;  1:45   EYE SURGERY Bilateral    phlebpheroplasty   Left shoultder surgery     for bone spurs & frozen shoulder    REVERSE SHOULDER ARTHROPLASTY Left 09/14/2020   Procedure: REVERSE SHOULDER ARTHROPLASTY;  Surgeon: Hiram Gash, MD;  [facility];  Service: Orthopedics;  Laterality: Left;   Right bunionectomy     TONSILLECTOMY     TUBAL LIGATION      There were no vitals filed for this visit.   Subjective Assessment - 01/04/21 1036     Subjective  S: I feel like I need a little more therapy. I want to be able to do my hair if possible and that's not where I am yet.    Currently in Pain? No/denies                [facility] OT Assessment - 01/04/21 1037       Assessment   Medical Diagnosis S/P left reverse total shoulder arthroplasty      Precautions   Precautions Shoulder;Other (comment)    Type of Shoulder Precautions Progress as  tolerated. (protocol: media tab)    Precaution Comments History of breast cancer and left side mastectomy      ROM / Strength   AROM / PROM / Strength AROM;PROM;Strength      AROM   Overall AROM Comments Assessed standing. IR/er adducted    AROM Assessment Site Shoulder    Right/Left Shoulder Left    Left Shoulder Flexion 98 Degrees   previous: same   Left Shoulder ABduction 105 Degrees   previous: 99   Left Shoulder Internal Rotation 90 Degrees   previous: same   Left Shoulder External Rotation 19 Degrees   previous: 5     PROM   Overall PROM Comments Assessed supine. IR/er adducted    PROM Assessment Site Shoulder    Right/Left Shoulder Left    Left Shoulder Flexion 121 Degrees   previous: 135   Left Shoulder ABduction 140 Degrees   prevous: 120   Left Shoulder Internal Rotation 90 Degrees   previous: same   Left Shoulder External Rotation 30 Degrees   previous: same     Strength   Overall Strength Comments Assessed standing. IR/er  adducted. Strength not assessed prior to this date.    Strength Assessment Site Shoulder    Right/Left Shoulder Left    Left Shoulder Flexion 3-/5   previous: same   Left Shoulder ABduction 3-/5   previous: same   Left Shoulder Internal Rotation 5/5   previous: 4-/5   Left Shoulder External Rotation 3-/5   previous: same                     OT Treatments/Exercises (OP) - 01/04/21 0001       Shoulder Exercises: Supine   Protraction PROM;5 reps    Horizontal ABduction PROM;5 reps    External Rotation PROM;5 reps    Internal Rotation PROM;5 reps    Flexion PROM;5 reps    ABduction PROM;5 reps      Shoulder Exercises: Stretch   Other Shoulder Stretches doorway stretch 3x20"      Manual Therapy   Manual Therapy Other (comment)    Manual therapy comments completed separately from therapeutic exercises    Other Manual Therapy subscapularis release completed supine.                    OT Education - 01/04/21 1112     Education Details modified corner stretch in HEP for doorway stretch due to no corners available in home to use. Pt is to just hold start position of doorway stretch and do not step through.    Person(s) Educated Patient    Methods Explanation;Demonstration;Handout;Tactile cues;Verbal cues    Comprehension Verbalized understanding;Returned demonstration              OT Short Term Goals - 01/04/21 1056       OT SHORT TERM GOAL #1   Title Patient will be educated and independent with HEP in order to increase her ability to use her LUE for daily tasks 50% of the time or more.    Time 6    Period Weeks    Target Date 11/25/20      OT SHORT TERM GOAL #2   Title Patient will increase her LUE P/ROM to [facility] in order to increase her ability to complete upper body dressing tasks with less difficulty.    Time 6    Period Weeks      OT SHORT TERM GOAL #3  Title Patient will increase her LUE strength to 3/5 in order to return to using her LUE  to fix her hair.    Time 6    Period Weeks    Status On-going      OT SHORT TERM GOAL #4   Title Patient will decrease her LUE fascial restrictions to moderate amount in order to increase the functional mobility needed to complete low level reaching tasks.    Time 6    Period Weeks               OT Long Term Goals - 01/04/21 1057       OT LONG TERM GOAL #1   Title Patient will increase her LUE A/ROM to [facility] in order to be able to complete functional reaching tasks at or above her shoulder level.    Time 12    Period Weeks    Status On-going      OT LONG TERM GOAL #2   Title Patient will increase her LUE strength to 4/5 to return to lifting household items of moderate weight using her LUE.    Time 12    Period Weeks    Status On-going      OT LONG TERM GOAL #3   Title Pt will report a pain level of approximately 3/10 or less when using her LUE for self care and required daily tasks.    Time 12    Period Weeks    Status On-going      OT LONG TERM GOAL #4   Title Patient will decrease her LUE fascial restrictions to minimal amount or less in order to increase the functional mobility needed to complete all required reaching tasks.    Time 12    Period Weeks                   Plan - 01/04/21 1116     Clinical Impression Statement A: Reassessment completed this date. There are some areas that have improved related to passive and active ROM. Greatest improvement noted with abduction passively and actively. Continues to have severe difficulty progressing external rotation and flexion (active and passive) Patient voiced desire to continue therapy a little longer to see if she can work towards doing her hair. She is able to reach the top of her head and the left side but not the back. Corner stretch was changed in HEP to doorway stretch. VC and physical cues were provided for proper form and technique .    Body Structure / Function / Physical Skills ADL;UE functional  use;Pain;ROM;Edema;Strength;Fascial restriction    OT Frequency 2x / week    OT Duration 4 weeks    Plan P: Work on increasing flexion and external rotation. Find out what specfic things she's wanting to do to fix her hair. Is there a way to complete with mainly right hand if needed? Any ways to modify in the time being?    OT Home Exercise Plan eval: table slides; 10/19: scapular A/ROM; 10/25: A/AROM shoulder 12/7: A/ROM (except horizontal abduction. continue completing AA/ROM); 12/30: shoulder stretches 01/04/21: complete doorway stretch instead of corner stretch    Consulted and Agree with Plan of Care Patient             Patient will benefit from skilled therapeutic intervention in order to improve the following deficits and impairments:   Body Structure / Function / Physical Skills: ADL, UE functional use, Pain, ROM, Edema, Strength, Fascial restriction  Visit Diagnosis: Stiffness of left shoulder, not elsewhere classified  Acute pain of left shoulder  Other symptoms and signs involving the musculoskeletal system    Problem List Patient Active Problem List   Diagnosis Date Noted   Malignant neoplasm of upper-inner quadrant of left breast in female, estrogen receptor positive ([place]) 03/05/2017   History of colonic polyps 10/31/2015   High cholesterol 10/17/2015   SHOULDER, ARTHRITIS, DEGEN./OSTEO 02/28/2009   CONTRACTURE OF SHOULDER JOINT 02/16/2009   SHOULDER PAIN 02/16/2009    Ailene Ravel, OTR/L,CBIS  [phone]  01/04/2021, 12:41 PM  [place], [place], [address] Phone: [phone]   Fax:  [phone]  Name: Breanna Hill MRN: [id number] Date of Birth: [birth year]

## 2021-01-05 DIAGNOSIS — Z7689 Persons encountering health services in other specified circumstances: Secondary | ICD-10-CM | POA: Diagnosis not present

## 2021-01-05 DIAGNOSIS — K624 Stenosis of anus and rectum: Secondary | ICD-10-CM | POA: Diagnosis not present

## 2021-01-05 DIAGNOSIS — E220 Acromegaly and pituitary gigantism: Secondary | ICD-10-CM | POA: Diagnosis not present

## 2021-01-05 DIAGNOSIS — M810 Age-related osteoporosis without current pathological fracture: Secondary | ICD-10-CM | POA: Diagnosis not present

## 2021-01-05 DIAGNOSIS — E559 Vitamin D deficiency, unspecified: Secondary | ICD-10-CM | POA: Diagnosis not present

## 2021-01-05 DIAGNOSIS — R5383 Other fatigue: Secondary | ICD-10-CM | POA: Diagnosis not present

## 2021-01-06 ENCOUNTER — Encounter (HOSPITAL_COMMUNITY): Payer: Medicare HMO

## 2021-01-06 ENCOUNTER — Ambulatory Visit (HOSPITAL_COMMUNITY): Payer: Medicare HMO | Admitting: Occupational Therapy

## 2021-01-06 ENCOUNTER — Other Ambulatory Visit: Payer: Self-pay

## 2021-01-06 ENCOUNTER — Encounter (HOSPITAL_COMMUNITY): Payer: Self-pay | Admitting: Occupational Therapy

## 2021-01-06 DIAGNOSIS — R29898 Other symptoms and signs involving the musculoskeletal system: Secondary | ICD-10-CM

## 2021-01-06 DIAGNOSIS — M25612 Stiffness of left shoulder, not elsewhere classified: Secondary | ICD-10-CM | POA: Diagnosis not present

## 2021-01-06 DIAGNOSIS — M25512 Pain in left shoulder: Secondary | ICD-10-CM | POA: Diagnosis not present

## 2021-01-06 NOTE — Therapy (Signed)
[place], [place], [address] Phone: [phone]   Fax:  [phone]  Occupational Therapy Treatment  Patient Details  Name: Breanna Hill MRN: [id number] Date of Birth: Oct 14, 1940 Referring Provider (OT): Ophelia Charter, MD   Encounter Date: 01/06/2021   OT End of Session - 01/06/21 1038     Visit Number 25    Number of Visits 32    Date for OT Re-Evaluation 02/01/21    Authorization Type 1) [employer]na Medicare HMO 2) [place]    Authorization Time Period 1) $35 copay no visit limit 2) $30 copay no authorization needed.    Progress Note Due on Visit 17    OT Start Time [phone]    OT Stop Time 1030    OT Time Calculation (min) 43 min    Activity Tolerance Patient tolerated treatment well    Behavior During Therapy WFL for tasks assessed/performed             Past Medical History:  Diagnosis Date   Arthritis    R hand    Breast cancer ([place])    Breast disorder    cancer   Cancer ([place])    breast CA- diagnosed with needle biopsy   Depression    GERD (gastroesophageal reflux disease)    rare use of tums    High cholesterol 10/17/2015   History of radiation therapy 03/27/17- 04/23/17   Left Breast, 2.67 Gy in 15 fractions for a total dose of 40.05 Gy. Boost, 2 Gy in 5 fractions for a total dose of 10 Gy.    Hypertension    Hypothyroidism    Pre-diabetes    Hgba1C- in the 6 's , per pt., she monitors her diet    Past Surgical History:  Procedure Laterality Date   BACK SURGERY  1973   BREAST LUMPECTOMY Left 02/2017   BREAST LUMPECTOMY WITH RADIOACTIVE SEED AND SENTINEL LYMPH NODE BIOPSY Left 02/27/2017   Procedure: LEFT BREAST LUMPECTOMY WITH RADIOACTIVE SEED AND LEFT AXILLARY SENTINEL LYMPH NODE BIOPSY;  Surgeon: Donnie Mesa, MD;  Location: [facility];  Service: General;  Laterality: Left;   COLONOSCOPY     X 2   COLONOSCOPY N/A 02/22/2016   Procedure: COLONOSCOPY;  Surgeon: Rogene Houston, MD;   [facility];  Service: Endoscopy;  Laterality: N/A;  1:45   EYE SURGERY Bilateral    phlebpheroplasty   Left shoultder surgery     for bone spurs & frozen shoulder    REVERSE SHOULDER ARTHROPLASTY Left 09/14/2020   Procedure: REVERSE SHOULDER ARTHROPLASTY;  Surgeon: Hiram Gash, MD;  [facility];  Service: Orthopedics;  Laterality: Left;   Right bunionectomy     TONSILLECTOMY     TUBAL LIGATION      There were no vitals filed for this visit.   Subjective Assessment - 01/06/21 0946     Subjective  S: I unloaded the dishwasher yesterday with my left hand.    Currently in Pain? No/denies                [facility] OT Assessment - 01/06/21 0945       Assessment   Medical Diagnosis S/P left reverse total shoulder arthroplasty      Precautions   Precautions Shoulder;Other (comment)    Type of Shoulder Precautions Progress as tolerated. (protocol: media tab)    Precaution Comments History of breast cancer and left side mastectomy  OT Treatments/Exercises (OP) - 01/06/21 0948       Exercises   Exercises Shoulder      Shoulder Exercises: Supine   Protraction PROM;5 reps    Horizontal ABduction PROM;5 reps    External Rotation PROM;5 reps    Internal Rotation PROM;5 reps    Flexion PROM;5 reps    ABduction PROM;5 reps      Shoulder Exercises: Standing   Protraction AROM;10 reps    Horizontal ABduction AROM;10 reps    External Rotation AROM;10 reps    Internal Rotation AROM;10 reps    Flexion AROM;10 reps;Limitations    Flexion Limitations to slightly above shoulder level    ABduction AROM;10 reps;Limitations    ABduction Limitations to shoulder level    Extension Theraband;10 reps    Theraband Level (Shoulder Extension) Level 2 (Red)    Row Theraband;12 reps    Theraband Level (Shoulder Row) Level 2 (Red)      Shoulder Exercises: ROM/Strengthening   UBE (Upper Arm Bike) Level 1 3' reverse, pace: 4.0    Over Head  Lace 2' seated    Other ROM/Strengthening Exercises Tennis ball pass behind back and head. 10X each direction    Other ROM/Strengthening Exercises pvc pipe slide, 10X flexion      Manual Therapy   Manual Therapy Other (comment)    Manual therapy comments completed separately from therapeutic exercises    Other Manual Therapy subscapularis and teres minor release completed in sidelying                      OT Short Term Goals - 01/04/21 1056       OT SHORT TERM GOAL #1   Title Patient will be educated and independent with HEP in order to increase her ability to use her LUE for daily tasks 50% of the time or more.    Time 6    Period Weeks    Target Date 11/25/20      OT SHORT TERM GOAL #2   Title Patient will increase her LUE P/ROM to [facility] in order to increase her ability to complete upper body dressing tasks with less difficulty.    Time 6    Period Weeks      OT SHORT TERM GOAL #3   Title Patient will increase her LUE strength to 3/5 in order to return to using her LUE to fix her hair.    Time 6    Period Weeks    Status On-going      OT SHORT TERM GOAL #4   Title Patient will decrease her LUE fascial restrictions to moderate amount in order to increase the functional mobility needed to complete low level reaching tasks.    Time 6    Period Weeks               OT Long Term Goals - 01/04/21 1057       OT LONG TERM GOAL #1   Title Patient will increase her LUE A/ROM to [facility] in order to be able to complete functional reaching tasks at or above her shoulder level.    Time 12    Period Weeks    Status On-going      OT LONG TERM GOAL #2   Title Patient will increase her LUE strength to 4/5 to return to lifting household items of moderate weight using her LUE.    Time 12    Period Weeks    Status  On-going      OT LONG TERM GOAL #3   Title Pt will report a pain level of approximately 3/10 or less when using her LUE for self care and required daily tasks.     Time 12    Period Weeks    Status On-going      OT LONG TERM GOAL #4   Title Patient will decrease her LUE fascial restrictions to minimal amount or less in order to increase the functional mobility needed to complete all required reaching tasks.    Time 12    Period Weeks                   Plan - 01/06/21 1038     Clinical Impression Statement A: Completed subscapularis release with pt in sidelying today, OT noting tightness in teres minor as well as trapezius tendons therefore manual therapy completed in these areas. Continued with passive stretching with pt achieving slighlty greater ROM in flexion and er today. Continued with A/ROM, ball passes, and added overhead lacing. Reviewed doorway stretch. Verbal cuing for form and technique.    Body Structure / Function / Physical Skills ADL;UE functional use;Pain;ROM;Edema;Strength;Fascial restriction    Plan P: Continue with sidelying manual techniques, Find out what specfic things she's wanting to do to fix her hair. Is there a way to complete with mainly right hand if needed? Any ways to modify in the time being?    OT Home Exercise Plan eval: table slides; 10/19: scapular A/ROM; 10/25: A/AROM shoulder 12/7: A/ROM (except horizontal abduction. continue completing AA/ROM); 12/30: shoulder stretches 01/04/21: complete doorway stretch instead of corner stretch    Consulted and Agree with Plan of Care Patient             Patient will benefit from skilled therapeutic intervention in order to improve the following deficits and impairments:   Body Structure / Function / Physical Skills: ADL, UE functional use, Pain, ROM, Edema, Strength, Fascial restriction       Visit Diagnosis: Stiffness of left shoulder, not elsewhere classified  Acute pain of left shoulder  Other symptoms and signs involving the musculoskeletal system    Problem List Patient Active Problem List   Diagnosis Date Noted   Malignant neoplasm of  upper-inner quadrant of left breast in female, estrogen receptor positive ([place]) 03/05/2017   History of colonic polyps 10/31/2015   High cholesterol 10/17/2015   SHOULDER, ARTHRITIS, DEGEN./OSTEO 02/28/2009   CONTRACTURE OF SHOULDER JOINT 02/16/2009   SHOULDER PAIN 02/16/2009    Guadelupe Sabin, OTR/L  [phone] 01/06/2021, 10:41 AM  [place], [place], [address] Phone: [phone]   Fax:  [phone]  Name: Breanna Hill MRN: [id number] Date of Birth: [birth year]

## 2021-01-09 DIAGNOSIS — M25511 Pain in right shoulder: Secondary | ICD-10-CM | POA: Diagnosis not present

## 2021-01-09 DIAGNOSIS — M810 Age-related osteoporosis without current pathological fracture: Secondary | ICD-10-CM | POA: Diagnosis not present

## 2021-01-10 ENCOUNTER — Ambulatory Visit (HOSPITAL_COMMUNITY): Payer: Medicare HMO | Admitting: Occupational Therapy

## 2021-01-10 ENCOUNTER — Telehealth (HOSPITAL_COMMUNITY): Payer: Self-pay | Admitting: Occupational Therapy

## 2021-01-10 NOTE — Telephone Encounter (Signed)
Called pt regarding no-show for 1:00 appt. No answer, left voicemail for pt reminding of next appt on 1/12 at 2:30.    Breanna Hill, OTR/L  [phone] 01/10/21

## 2021-01-11 ENCOUNTER — Encounter (HOSPITAL_COMMUNITY): Payer: Medicare HMO

## 2021-01-12 ENCOUNTER — Other Ambulatory Visit: Payer: Self-pay

## 2021-01-12 ENCOUNTER — Encounter (HOSPITAL_COMMUNITY): Payer: Self-pay | Admitting: Occupational Therapy

## 2021-01-12 ENCOUNTER — Ambulatory Visit (HOSPITAL_COMMUNITY): Payer: Medicare HMO | Admitting: Occupational Therapy

## 2021-01-12 DIAGNOSIS — M25512 Pain in left shoulder: Secondary | ICD-10-CM | POA: Diagnosis not present

## 2021-01-12 DIAGNOSIS — R29898 Other symptoms and signs involving the musculoskeletal system: Secondary | ICD-10-CM | POA: Diagnosis not present

## 2021-01-12 DIAGNOSIS — M25612 Stiffness of left shoulder, not elsewhere classified: Secondary | ICD-10-CM

## 2021-01-12 NOTE — Therapy (Signed)
[place], [place], [address] Phone: [phone]   Fax:  [phone]  Occupational Therapy Treatment  Patient Details  Name: Breanna Hill MRN: [id number] Date of Birth: June 20, 1940 Referring Provider (OT): Ophelia Charter, MD   Encounter Date: 01/12/2021   OT End of Session - 01/12/21 1514     Visit Number 26    Number of Visits 32    Date for OT Re-Evaluation 02/01/21    Authorization Type 1) [employer] Medicare HMO 2) [place]    Authorization Time Period 1) $35 copay no visit limit 2) $30 copay no authorization needed.    Progress Note Due on Visit 46    OT Start Time 1434    OT Stop Time 1512    OT Time Calculation (min) 38 min    Activity Tolerance Patient tolerated treatment well    Behavior During Therapy WFL for tasks assessed/performed             Past Medical History:  Diagnosis Date   Arthritis    R hand    Breast cancer ([place])    Breast disorder    cancer   Cancer ([place])    breast CA- diagnosed with needle biopsy   Depression    GERD (gastroesophageal reflux disease)    rare use of tums    High cholesterol 10/17/2015   History of radiation therapy 03/27/17- 04/23/17   Left Breast, 2.67 Gy in 15 fractions for a total dose of 40.05 Gy. Boost, 2 Gy in 5 fractions for a total dose of 10 Gy.    Hypertension    Hypothyroidism    Pre-diabetes    Hgba1C- in the 6 's , per pt., she monitors her diet    Past Surgical History:  Procedure Laterality Date   BACK SURGERY  1973   BREAST LUMPECTOMY Left 02/2017   BREAST LUMPECTOMY WITH RADIOACTIVE SEED AND SENTINEL LYMPH NODE BIOPSY Left 02/27/2017   Procedure: LEFT BREAST LUMPECTOMY WITH RADIOACTIVE SEED AND LEFT AXILLARY SENTINEL LYMPH NODE BIOPSY;  Surgeon: Donnie Mesa, MD;  Location: [facility];  Service: General;  Laterality: Left;   COLONOSCOPY     X 2   COLONOSCOPY N/A 02/22/2016   Procedure: COLONOSCOPY;  Surgeon: Rogene Houston, MD;   [facility];  Service: Endoscopy;  Laterality: N/A;  1:45   EYE SURGERY Bilateral    phlebpheroplasty   Left shoultder surgery     for bone spurs & frozen shoulder    REVERSE SHOULDER ARTHROPLASTY Left 09/14/2020   Procedure: REVERSE SHOULDER ARTHROPLASTY;  Surgeon: Hiram Gash, MD;  [facility];  Service: Orthopedics;  Laterality: Left;   Right bunionectomy     TONSILLECTOMY     TUBAL LIGATION      There were no vitals filed for this visit.   Subjective Assessment - 01/12/21 1435     Subjective  S: I went to the doctor and they said    Currently in Pain? No/denies                [facility] OT Assessment - 01/12/21 1434       Assessment   Medical Diagnosis S/P left reverse total shoulder arthroplasty      Precautions   Precautions Shoulder;Other (comment)    Type of Shoulder Precautions Progress as tolerated. (protocol: media tab)    Precaution Comments History of breast cancer and left side mastectomy  OT Treatments/Exercises (OP) - 01/12/21 1453       Exercises   Exercises Shoulder      Shoulder Exercises: Standing   Protraction AROM;10 reps    Horizontal ABduction AROM;10 reps    External Rotation AAROM;10 reps    Internal Rotation AROM;10 reps    Flexion AAROM;10 reps    Flexion Limitations to slightly above shoulder level    ABduction AAROM;10 reps;Limitations    ABduction Limitations to slightly above shoulder level      Shoulder Exercises: ROM/Strengthening   Other ROM/Strengthening Exercises Tennis ball pass behind back and head. 10X each direction    Other ROM/Strengthening Exercises pvc pipe slide, 12X flexion      Shoulder Exercises: Stretch   Cross Chest Stretch 3 reps;10 seconds    Wall Stretch - Flexion 3 reps;10 seconds    Wall Stretch - ABduction 3 reps;10 seconds      Manual Therapy   Manual Therapy Other (comment)    Manual therapy comments completed separately from therapeutic exercises     Other Manual Therapy subscapularis and teres minor release completed in sidelying                      OT Short Term Goals - 01/04/21 1056       OT SHORT TERM GOAL #1   Title Patient will be educated and independent with HEP in order to increase her ability to use her LUE for daily tasks 50% of the time or more.    Time 6    Period Weeks    Target Date 11/25/20      OT SHORT TERM GOAL #2   Title Patient will increase her LUE P/ROM to [facility] in order to increase her ability to complete upper body dressing tasks with less difficulty.    Time 6    Period Weeks      OT SHORT TERM GOAL #3   Title Patient will increase her LUE strength to 3/5 in order to return to using her LUE to fix her hair.    Time 6    Period Weeks    Status On-going      OT SHORT TERM GOAL #4   Title Patient will decrease her LUE fascial restrictions to moderate amount in order to increase the functional mobility needed to complete low level reaching tasks.    Time 6    Period Weeks               OT Long Term Goals - 01/04/21 1057       OT LONG TERM GOAL #1   Title Patient will increase her LUE A/ROM to [facility] in order to be able to complete functional reaching tasks at or above her shoulder level.    Time 12    Period Weeks    Status On-going      OT LONG TERM GOAL #2   Title Patient will increase her LUE strength to 4/5 to return to lifting household items of moderate weight using her LUE.    Time 12    Period Weeks    Status On-going      OT LONG TERM GOAL #3   Title Pt will report a pain level of approximately 3/10 or less when using her LUE for self care and required daily tasks.    Time 12    Period Weeks    Status On-going      OT LONG TERM GOAL #4  Title Patient will decrease her LUE fascial restrictions to minimal amount or less in order to increase the functional mobility needed to complete all required reaching tasks.    Time 12    Period Weeks                    Plan - 01/12/21 1515     Clinical Impression Statement A: Pt reports MD has suggested dry needling and is questioning the technique and if it would help her. Discussed technique in depth and pt will bring her referral next week if she decides she wants to try it. Continued with manual techniques,  pt continues to have tenderness at teres minor and trapezius today. Session focusing on sustained stretching and ROM. Verbal cuing for form and technique.    Body Structure / Function / Physical Skills ADL;UE functional use;Pain;ROM;Edema;Strength;Fascial restriction    Plan P: follow up on dry needling request, if pt decides to pursue let front know and continue with OT until pt is placed on PT schedule then discharge    OT Home Exercise Plan eval: table slides; 10/19: scapular A/ROM; 10/25: A/AROM shoulder 12/7: A/ROM (except horizontal abduction. continue completing AA/ROM); 12/30: shoulder stretches 01/04/21: complete doorway stretch instead of corner stretch    Consulted and Agree with Plan of Care Patient             Patient will benefit from skilled therapeutic intervention in order to improve the following deficits and impairments:   Body Structure / Function / Physical Skills: ADL, UE functional use, Pain, ROM, Edema, Strength, Fascial restriction       Visit Diagnosis: Stiffness of left shoulder, not elsewhere classified  Acute pain of left shoulder  Other symptoms and signs involving the musculoskeletal system    Problem List Patient Active Problem List   Diagnosis Date Noted   Malignant neoplasm of upper-inner quadrant of left breast in female, estrogen receptor positive ([place]) 03/05/2017   History of colonic polyps 10/31/2015   High cholesterol 10/17/2015   SHOULDER, ARTHRITIS, DEGEN./OSTEO 02/28/2009   CONTRACTURE OF SHOULDER JOINT 02/16/2009   SHOULDER PAIN 02/16/2009    Guadelupe Sabin, OTR/L  [phone] 01/12/2021, 3:37 PM  [facility] [address] [place], [place], [address] Phone: [phone]   Fax:  [phone]  Name: ARLEIGH DICOLA MRN: [id number] Date of Birth: [birth year]

## 2021-01-17 ENCOUNTER — Other Ambulatory Visit: Payer: Self-pay

## 2021-01-17 ENCOUNTER — Encounter (HOSPITAL_COMMUNITY): Payer: Self-pay | Admitting: Occupational Therapy

## 2021-01-17 ENCOUNTER — Ambulatory Visit (HOSPITAL_COMMUNITY): Payer: Medicare HMO | Admitting: Occupational Therapy

## 2021-01-17 DIAGNOSIS — M25512 Pain in left shoulder: Secondary | ICD-10-CM | POA: Diagnosis not present

## 2021-01-17 DIAGNOSIS — M25612 Stiffness of left shoulder, not elsewhere classified: Secondary | ICD-10-CM

## 2021-01-17 DIAGNOSIS — R29898 Other symptoms and signs involving the musculoskeletal system: Secondary | ICD-10-CM | POA: Diagnosis not present

## 2021-01-17 NOTE — Therapy (Addendum)
[place], [place], [address] Phone: [phone]   Fax:  [phone]  Occupational Therapy Treatment  Patient Details  Name: [place] MRN: [id number] Date of Birth: [birth year] Referring Provider (OT): Ophelia Charter, MD   [place]  Visits from Start of Care: 27  Current functional level related to goals / functional outcomes: Pt with A/ROM approximately 50-60%, reports she can now fix her hair. Pt is transitioning to PT for dry needling services. Pt met 3 STGs and 1 LTG.    Remaining deficits: Decreased LUE strength and ROM, intermittent pain   Education / Equipment: HEPs   Patient agrees to discharge. Patient goals were partially met. Patient is being discharged due to lack of progress..      Encounter Date: 01/17/2021   OT End of Session - 01/17/21 1352     Visit Number 27    Number of Visits 32    Date for OT Re-Evaluation 02/01/21    Authorization Type 1) [employer] Medicare HMO 2) [place]    Authorization Time Period 1) $35 copay no visit limit 2) $30 copay no authorization needed.    Progress Note Due on Visit 29    OT Start Time 1257    OT Stop Time 1344    OT Time Calculation (min) 47 min    Activity Tolerance Patient tolerated treatment well    Behavior During Therapy WFL for tasks assessed/performed             Past Medical History:  Diagnosis Date   Arthritis    R hand    Breast cancer ([place])    Breast disorder    cancer   Cancer ([place])    breast CA- diagnosed with needle biopsy   Depression    GERD (gastroesophageal reflux disease)    rare use of tums    High cholesterol 10/17/2015   History of radiation therapy 03/27/17- 04/23/17   Left Breast, 2.67 Gy in 15 fractions for a total dose of 40.05 Gy. Boost, 2 Gy in 5 fractions for a total dose of 10 Gy.    Hypertension    Hypothyroidism    Pre-diabetes    Hgba1C- in the 6 's , per  pt., she monitors her diet    Past Surgical History:  Procedure Laterality Date   BACK SURGERY  1973   BREAST LUMPECTOMY Left 02/2017   BREAST LUMPECTOMY WITH RADIOACTIVE SEED AND SENTINEL LYMPH NODE BIOPSY Left 02/27/2017   Procedure: LEFT BREAST LUMPECTOMY WITH RADIOACTIVE SEED AND LEFT AXILLARY SENTINEL LYMPH NODE BIOPSY;  Surgeon: Donnie Mesa, MD;  Location: [facility];  Service: General;  Laterality: Left;   COLONOSCOPY     X 2   COLONOSCOPY N/A 02/22/2016   Procedure: COLONOSCOPY;  Surgeon: Rogene Houston, MD;  [facility];  Service: Endoscopy;  Laterality: N/A;  1:45   EYE SURGERY Bilateral    phlebpheroplasty   Left shoultder surgery     for bone spurs & frozen shoulder    REVERSE SHOULDER ARTHROPLASTY Left 09/14/2020   Procedure: REVERSE SHOULDER ARTHROPLASTY;  Surgeon: Hiram Gash, MD;  [facility];  Service: Orthopedics;  Laterality: Left;   Right bunionectomy     TONSILLECTOMY     TUBAL LIGATION      There were no vitals filed for this visit.   Subjective Assessment - 01/17/21 1254     Subjective  S: I think  I'd to try the needling.    Currently in Pain? No/denies                [facility] OT Assessment - 01/17/21 1253       Assessment   Medical Diagnosis S/P left reverse total shoulder arthroplasty      Precautions   Precautions Shoulder;Other (comment)    Type of Shoulder Precautions Progress as tolerated. (protocol: media tab)    Precaution Comments History of breast cancer and left side mastectomy                      OT Treatments/Exercises (OP) - 01/17/21 1300       Exercises   Exercises Shoulder      Shoulder Exercises: Supine   Protraction PROM;5 reps    Horizontal ABduction PROM;5 reps    External Rotation PROM;5 reps    Internal Rotation PROM;5 reps    Flexion PROM;5 reps    ABduction PROM;5 reps      Shoulder Exercises: Standing   Protraction AROM;10 reps    Horizontal ABduction AROM;10 reps    External  Rotation AROM;10 reps    Internal Rotation AROM;10 reps    Flexion AROM;10 reps    Flexion Limitations to slightly above shoulder level    ABduction AROM    ABduction Limitations to slightly above shoulder level      Shoulder Exercises: ROM/Strengthening   Other ROM/Strengthening Exercises Tennis ball pass behind back and head. 10X each direction      Shoulder Exercises: Stretch   Cross Chest Stretch 3 reps;10 seconds    Internal Rotation Stretch 3 reps   horizontal towel 10" holds   External Rotation Stretch 3 reps;10 seconds    Wall Stretch - Flexion 3 reps;10 seconds    Wall Stretch - ABduction 3 reps;10 seconds      Functional Reaching Activities   Mid Level Pt placing cones on bottom shelf of overhead cabinet in flexion. Then pt removing in abduction. Completed for 10 cones      Manual Therapy   Manual Therapy Other (comment)    Manual therapy comments completed separately from therapeutic exercises    Other Manual Therapy subscapularis and teres minor release completed in sidelying                      OT Short Term Goals - 01/04/21 1056       OT SHORT TERM GOAL #1   Title Patient will be educated and independent with HEP in order to increase her ability to use her LUE for daily tasks 50% of the time or more.    Time 6    Period Weeks    Target Date 11/25/20      OT SHORT TERM GOAL #2   Title Patient will increase her LUE P/ROM to [facility] in order to increase her ability to complete upper body dressing tasks with less difficulty.    Time 6    Period Weeks      OT SHORT TERM GOAL #3   Title Patient will increase her LUE strength to 3/5 in order to return to using her LUE to fix her hair.    Time 6    Period Weeks    Status On-going      OT SHORT TERM GOAL #4   Title Patient will decrease her LUE fascial restrictions to moderate amount in order to increase the functional mobility needed to complete  low level reaching tasks.    Time 6    Period Weeks                OT Long Term Goals - 01/04/21 1057       OT LONG TERM GOAL #1   Title Patient will increase her LUE A/ROM to [facility] in order to be able to complete functional reaching tasks at or above her shoulder level.    Time 12    Period Weeks    Status On-going      OT LONG TERM GOAL #2   Title Patient will increase her LUE strength to 4/5 to return to lifting household items of moderate weight using her LUE.    Time 12    Period Weeks    Status On-going      OT LONG TERM GOAL #3   Title Pt will report a pain level of approximately 3/10 or less when using her LUE for self care and required daily tasks.    Time 12    Period Weeks    Status On-going      OT LONG TERM GOAL #4   Title Patient will decrease her LUE fascial restrictions to minimal amount or less in order to increase the functional mobility needed to complete all required reaching tasks.    Time 12    Period Weeks                   Plan - 01/17/21 1337     Clinical Impression Statement A: Pt reports she would like to try dry needling if it will help her shoulder. Gave PT order to front desk to begin the process. Continued with manual techniques and passive stretching. A/ROM and sustained stretches in standing. Pt completing functional reaching to slightly above shoulder height. Verbal cuing for form and technique.    Body Structure / Function / Physical Skills ADL;UE functional use;Pain;ROM;Edema;Strength;Fascial restriction    Plan P: Reassess and discharge to PT for dry needling    OT Home Exercise Plan eval: table slides; 10/19: scapular A/ROM; 10/25: A/AROM shoulder 12/7: A/ROM (except horizontal abduction. continue completing AA/ROM); 12/30: shoulder stretches 01/04/21: complete doorway stretch instead of corner stretch    Consulted and Agree with Plan of Care Patient             Patient will benefit from skilled therapeutic intervention in order to improve the following deficits and impairments:    Body Structure / Function / Physical Skills: ADL, UE functional use, Pain, ROM, Edema, Strength, Fascial restriction       Visit Diagnosis: Stiffness of left shoulder, not elsewhere classified  Acute pain of left shoulder  Other symptoms and signs involving the musculoskeletal system    Problem List Patient Active Problem List   Diagnosis Date Noted   Malignant neoplasm of upper-inner quadrant of left breast in female, estrogen receptor positive ([place]) 03/05/2017   History of colonic polyps 10/31/2015   High cholesterol 10/17/2015   SHOULDER, ARTHRITIS, DEGEN./OSTEO 02/28/2009   CONTRACTURE OF SHOULDER JOINT 02/16/2009   SHOULDER PAIN 02/16/2009    Guadelupe Sabin, OTR/L  [phone] 01/17/2021, 1:53 PM  [facility] [place], [place], [address] Phone: [phone]   Fax:  [phone]  Name: [place] MRN: [id number] Date of Birth: [birth year]

## 2021-01-18 ENCOUNTER — Encounter (HOSPITAL_COMMUNITY): Payer: Medicare HMO | Admitting: Occupational Therapy

## 2021-01-19 ENCOUNTER — Encounter (HOSPITAL_COMMUNITY): Payer: Medicare HMO | Admitting: Occupational Therapy

## 2021-01-24 ENCOUNTER — Ambulatory Visit (HOSPITAL_COMMUNITY): Payer: Medicare HMO | Admitting: Physical Therapy

## 2021-01-24 ENCOUNTER — Other Ambulatory Visit: Payer: Self-pay

## 2021-01-24 ENCOUNTER — Encounter (HOSPITAL_COMMUNITY): Payer: Self-pay | Admitting: Physical Therapy

## 2021-01-24 DIAGNOSIS — M25512 Pain in left shoulder: Secondary | ICD-10-CM

## 2021-01-24 DIAGNOSIS — R29898 Other symptoms and signs involving the musculoskeletal system: Secondary | ICD-10-CM | POA: Diagnosis not present

## 2021-01-24 DIAGNOSIS — M25612 Stiffness of left shoulder, not elsewhere classified: Secondary | ICD-10-CM

## 2021-01-24 NOTE — Patient Instructions (Signed)

## 2021-01-24 NOTE — Therapy (Signed)
[place], [place], [address] Phone: [phone]   Fax:  [phone]  Physical Therapy Evaluation  Patient Details  Name: Breanna Hill MRN: [id number] Date of Birth: 1940/03/17 Referring Provider (PT): Ophelia Charter MD   Encounter Date: 01/24/2021   PT End of Session - 01/24/21 1019     Visit Number 1    Number of Visits 8    Date for PT Re-Evaluation 02/21/21    Authorization Type [employer] Medicare HMO, [place] 2ndary    PT Start Time [phone]    PT Stop Time 1030    PT Time Calculation (min) 45 min    Activity Tolerance Patient tolerated treatment well    Behavior During Therapy [facility] for tasks assessed/performed             Past Medical History:  Diagnosis Date   Arthritis    R hand    Breast cancer ([place])    Breast disorder    cancer   Cancer ([place])    breast CA- diagnosed with needle biopsy   Depression    GERD (gastroesophageal reflux disease)    rare use of tums    High cholesterol 10/17/2015   History of radiation therapy 03/27/17- 04/23/17   Left Breast, 2.67 Gy in 15 fractions for a total dose of 40.05 Gy. Boost, 2 Gy in 5 fractions for a total dose of 10 Gy.    Hypertension    Hypothyroidism    Pre-diabetes    Hgba1C- in the 6 's , per pt., she monitors her diet    Past Surgical History:  Procedure Laterality Date   BACK SURGERY  1973   BREAST LUMPECTOMY Left 02/2017   BREAST LUMPECTOMY WITH RADIOACTIVE SEED AND SENTINEL LYMPH NODE BIOPSY Left 02/27/2017   Procedure: LEFT BREAST LUMPECTOMY WITH RADIOACTIVE SEED AND LEFT AXILLARY SENTINEL LYMPH NODE BIOPSY;  Surgeon: Donnie Mesa, MD;  Location: [facility];  Service: General;  Laterality: Left;   COLONOSCOPY     X 2   COLONOSCOPY N/A 02/22/2016   Procedure: COLONOSCOPY;  Surgeon: Rogene Houston, MD;  [facility];  Service: Endoscopy;  Laterality: N/A;  1:45   EYE SURGERY Bilateral    phlebpheroplasty   Left shoultder  surgery     for bone spurs & frozen shoulder    REVERSE SHOULDER ARTHROPLASTY Left 09/14/2020   Procedure: REVERSE SHOULDER ARTHROPLASTY;  Surgeon: Hiram Gash, MD;  [facility];  Service: Orthopedics;  Laterality: Left;   Right bunionectomy     TONSILLECTOMY     TUBAL LIGATION      There were no vitals filed for this visit.    Subjective Assessment - 01/24/21 0956     Subjective Patient presents to therapy with complaint of LT shoulder pain and stiffness. She is previously known to this clinic as she had OT for her shoulder. She is limited in her range of motion and continues to have difficulty with ADL such as drying her hair. She was recommended to try dry needling for improved shoulder mobility and pain reduction.    Pertinent History LT TSA    Limitations Lifting;House hold activities;Other (comment)    Patient Stated Goals Be back to 100%, reach overhead and dry hair    Currently in Pain? Yes    Pain Score 4     Pain Location Shoulder    Pain Orientation Left    Pain Descriptors / Indicators Aching;Sore;Tightness  Pain Type Chronic pain    Pain Onset Today    Pain Frequency Constant    Aggravating Factors  increased use, reaching above shoulder height    Pain Relieving Factors rest, meds    Effect of Pain on Daily Activities Limits                [facility] PT Assessment - 01/24/21 0001       Assessment   Medical Diagnosis LT shoulder pain    Referring Provider (PT) Ophelia Charter MD    Prior Therapy Yes, OT for shoulder      Precautions   Precautions None      Restrictions   Weight Bearing Restrictions No      Balance Screen   Has the patient fallen in the past 6 months Yes    How many times? 1    Has the patient had a decrease in activity level because of a fear of falling?  Yes    Is the patient reluctant to leave their home because of a fear of falling?  No      Home [employer] residence      Prior Function   Level of  Independence Independent      Cognition   Overall Cognitive Status Within Functional Limits for tasks assessed      Observation/Other Assessments   Focus on Therapeutic Outcomes (FOTO)  NA      Posture/Postural Control   Posture/Postural Control Postural limitations    Postural Limitations Rounded Shoulders;Forward head;Increased thoracic kyphosis      AROM   Right/Left Shoulder Right    Right Shoulder Flexion 102 Degrees    Right Shoulder Internal Rotation --   T8   Right Shoulder External Rotation --   T2   Left Shoulder Flexion 92 Degrees    Left Shoulder ABduction 84 Degrees    Left Shoulder Internal Rotation --   Si joint   Left Shoulder External Rotation --   Occiput     Strength   Right/Left Shoulder Right    Right Shoulder Flexion 5/5    Right Shoulder Internal Rotation 5/5    Right Shoulder External Rotation 5/5    Left Shoulder Flexion 4/5   with scapular compensation   Left Shoulder Internal Rotation 5/5    Left Shoulder External Rotation 4/5      Palpation   Palpation comment Mod TTP about LT infraspinatus and supraspinatus muscle belly, Max TTP about LT lat                        Objective measurements completed on examination: See above findings.       [facility] Adult PT Treatment/Exercise - 01/24/21 0001       Shoulder Exercises: Supine   Other Supine Exercises lat stretch with rod 10 x 10"                     PT Education - 01/24/21 0959     Education Details on evalution findings, POC, dry needling application and precautions and HEP    Person(s) Educated Patient    Methods Explanation;Handout    Comprehension Verbalized understanding              PT Short Term Goals - 01/24/21 1024       PT SHORT TERM GOAL #1   Title Patient will be independent with initial HEP and self-management strategies to improve  functional outcomes    Time 2    Period Weeks    Status New    Target Date 02/07/21                PT Long Term Goals - 01/24/21 1025       PT LONG TERM GOAL #1   Title Patient will be independent with Advanced HEP and self-management strategies to improve functional outcomes    Time 4    Period Weeks    Status New    Target Date 02/21/21      PT LONG TERM GOAL #2   Title Patient will report at least 65% overall improvement in subjective complaint to indicate improvement in ability to perform ADLs.    Time 4    Period Weeks    Status New    Target Date 02/21/21      PT LONG TERM GOAL #3   Title Patient will have LT shoulder flexion AROM at least 105 degrees for improved ability for dressing and ADLs    Time 4    Period Weeks    Status New    Target Date 02/21/21                    Plan - 01/24/21 1020     Clinical Impression Statement Patient is an 81 y.o. female who presents to physical therapy with complaint of Lt shoulder pain. Patient demonstrates decreased strength, ROM restriction, reduced flexibility, increased tenderness to palpation and postural abnormalities which are likely contributing to symptoms of pain and are negatively impacting patient ability to perform ADLs. Patient will benefit from skilled physical therapy services to address these deficits to reduce pain and improve level of function with ADLs    Examination-Activity Limitations Reach Overhead;Carry;Lift;Dressing;Hygiene/Grooming    Examination-Participation Restrictions Cleaning;Driving;Yard Work;Meal Prep;Laundry    Stability/Clinical Decision Making Stable/Uncomplicated    Clinical Decision Making Low    Rehab Potential Good    PT Frequency 2x / week    PT Duration 4 weeks    PT Treatment/Interventions ADLs/Self Care Home Management;Biofeedback;Cryotherapy;Electrical Stimulation;Therapeutic exercise;Therapeutic activities;Ultrasound;Traction;Moist Heat;Iontophoresis 4mg /ml Dexamethasone;DME Instruction;Patient/family education;Manual techniques;Compression bandaging;Orthotic  Fit/Training;Energy conservation;Splinting;Taping;Vasopneumatic Device;Joint Manipulations;Spinal Manipulations;Dry needling;Passive range of motion;Scar mobilization;Visual/perceptual remediation/compensation;Balance training    PT Next Visit Plan Progress postural strength, thoracic and GHJ mobility as tolerated. F/U about DN. Manual PROM and STM as needed    PT Home Exercise Plan Eval: reviewed previous OT HEP    Consulted and Agree with Plan of Care Patient             Patient will benefit from skilled therapeutic intervention in order to improve the following deficits and impairments:  Pain, Improper body mechanics, Increased fascial restricitons, Impaired flexibility, Postural dysfunction, Decreased activity tolerance, Decreased range of motion, Decreased strength, Impaired UE functional use  Visit Diagnosis: Stiffness of left shoulder, not elsewhere classified  Acute pain of left shoulder     Problem List Patient Active Problem List   Diagnosis Date Noted   Malignant neoplasm of upper-inner quadrant of left breast in female, estrogen receptor positive ([place]) 03/05/2017   History of colonic polyps 10/31/2015   High cholesterol 10/17/2015   SHOULDER, ARTHRITIS, DEGEN./OSTEO 02/28/2009   CONTRACTURE OF SHOULDER JOINT 02/16/2009   SHOULDER PAIN 02/16/2009   10:28 AM, 01/24/21 Josue Hector PT DPT  Physical Therapist with [facility]  [phone] [place], [place], [address] Phone:  [phone]   Fax:  [phone]  Name: Breanna Hill MRN: [id number] Date of Birth: March 15, 1940

## 2021-01-25 ENCOUNTER — Encounter (HOSPITAL_COMMUNITY): Payer: Medicare HMO

## 2021-01-26 ENCOUNTER — Other Ambulatory Visit: Payer: Self-pay

## 2021-01-26 ENCOUNTER — Ambulatory Visit (HOSPITAL_COMMUNITY): Payer: Medicare HMO | Admitting: Physical Therapy

## 2021-01-26 ENCOUNTER — Encounter (HOSPITAL_COMMUNITY): Payer: Medicare HMO

## 2021-01-26 ENCOUNTER — Encounter (HOSPITAL_COMMUNITY): Payer: Self-pay | Admitting: Physical Therapy

## 2021-01-26 DIAGNOSIS — M25612 Stiffness of left shoulder, not elsewhere classified: Secondary | ICD-10-CM

## 2021-01-26 DIAGNOSIS — M25512 Pain in left shoulder: Secondary | ICD-10-CM | POA: Diagnosis not present

## 2021-01-26 DIAGNOSIS — R29898 Other symptoms and signs involving the musculoskeletal system: Secondary | ICD-10-CM | POA: Diagnosis not present

## 2021-01-26 NOTE — Patient Instructions (Signed)
Access Code: [email or web address] URL: [email or web address] Date: 01/26/2021 Prepared by: Josue Hector  Exercises Standing Shoulder Row with Anchored Resistance - 2-3 x daily - 7 x weekly - 2 sets - 10 reps Supine Shoulder Flexion AAROM with Dowel - 2-3 x daily - 7 x weekly - 1 sets - 10 reps - 10 second hold Supine Shoulder External Rotation with Dowel - 2-3 x daily - 7 x weekly - 1 sets - 10 reps - 10 second hold

## 2021-01-26 NOTE — Therapy (Signed)
[place], [place], [address] Phone: [phone]   Fax:  [phone]  Physical Therapy Treatment  Patient Details  Name: Breanna Hill MRN: [id number] Date of Birth: [birth year] Referring Provider (PT): Ophelia Charter MD   Encounter Date: 01/26/2021   PT End of Session - 01/26/21 1119     Visit Number 2    Number of Visits 8    Date for PT Re-Evaluation 02/21/21    Authorization Type [employer] Medicare HMO, [place] 2ndary    PT Start Time [phone]    PT Stop Time 1155    PT Time Calculation (min) 40 min    Activity Tolerance Patient tolerated treatment well    Behavior During Therapy [facility] for tasks assessed/performed             Past Medical History:  Diagnosis Date   Arthritis    R hand    Breast cancer ([place])    Breast disorder    cancer   Cancer ([place])    breast CA- diagnosed with needle biopsy   Depression    GERD (gastroesophageal reflux disease)    rare use of tums    High cholesterol 10/17/2015   History of radiation therapy 03/27/17- 04/23/17   Left Breast, 2.67 Gy in 15 fractions for a total dose of 40.05 Gy. Boost, 2 Gy in 5 fractions for a total dose of 10 Gy.    Hypertension    Hypothyroidism    Pre-diabetes    Hgba1C- in the 6 's , per pt., she monitors her diet    Past Surgical History:  Procedure Laterality Date   BACK SURGERY  1973   BREAST LUMPECTOMY Left 02/2017   BREAST LUMPECTOMY WITH RADIOACTIVE SEED AND SENTINEL LYMPH NODE BIOPSY Left 02/27/2017   Procedure: LEFT BREAST LUMPECTOMY WITH RADIOACTIVE SEED AND LEFT AXILLARY SENTINEL LYMPH NODE BIOPSY;  Surgeon: Donnie Mesa, MD;  Location: [facility];  Service: General;  Laterality: Left;   COLONOSCOPY     X 2   COLONOSCOPY N/A 02/22/2016   Procedure: COLONOSCOPY;  Surgeon: Rogene Houston, MD;  [facility];  Service: Endoscopy;  Laterality: N/A;  1:45   EYE SURGERY Bilateral    phlebpheroplasty   Left shoultder  surgery     for bone spurs & frozen shoulder    REVERSE SHOULDER ARTHROPLASTY Left 09/14/2020   Procedure: REVERSE SHOULDER ARTHROPLASTY;  Surgeon: Hiram Gash, MD;  [facility];  Service: Orthopedics;  Laterality: Left;   Right bunionectomy     TONSILLECTOMY     TUBAL LIGATION      There were no vitals filed for this visit.   Subjective Assessment - 01/26/21 1118     Subjective Patient states she had trouble sleeping last night. Shoulder was hurting. Took some Advil this morning.    Pertinent History LT TSA    Limitations Lifting;House hold activities;Other (comment)    Patient Stated Goals Be back to 100%, reach overhead and dry hair    Currently in Pain? Yes    Pain Score 5     Pain Location Shoulder    Pain Orientation Left    Pain Descriptors / Indicators Aching;Sore;Tightness    Pain Type Chronic pain    Pain Onset Today                               [facility] Adult  PT Treatment/Exercise - 01/26/21 0001       Shoulder Exercises: Supine   Other Supine Exercises lat stretch with rod 10 x 10"      Shoulder Exercises: Sidelying   External Rotation Left;15 reps      Shoulder Exercises: Standing   Other Standing Exercises RTB rows x 20      Shoulder Exercises: Stretch   External Rotation Stretch --   10 x 10" with pipe     Manual Therapy   Manual Therapy Soft tissue mobilization;Passive ROM    Manual therapy comments completed separately from therapeutic exercises    Soft tissue mobilization STM to LT latissimus and upper trap pre and post dry needling for trigger point ID and surface area prep    Passive ROM LT GHJ PROM flexion and abduction              Trigger Point Dry Needling - 01/26/21 0001     Consent Given? Yes    Education Handout Provided Yes    Muscles Treated Head and Neck Upper trapezius    Muscles Treated Upper Quadrant Latissimus dorsi    Dry Needling Comments 1 needle to LT lat patien tin supine, 1 neeld to LT upper  trap patient in prone    Other Dry Needling good tolerance, though muscle garding with needling Lat    Upper Trapezius Response Twitch reponse elicited;Palpable increased muscle length    Latissimus dorsi Response --   garding                    PT Short Term Goals - 01/24/21 1024       PT SHORT TERM GOAL #1   Title Patient will be independent with initial HEP and self-management strategies to improve functional outcomes    Time 2    Period Weeks    Status New    Target Date 02/07/21               PT Long Term Goals - 01/24/21 1025       PT LONG TERM GOAL #1   Title Patient will be independent with Advanced HEP and self-management strategies to improve functional outcomes    Time 4    Period Weeks    Status New    Target Date 02/21/21      PT LONG TERM GOAL #2   Title Patient will report at least 65% overall improvement in subjective complaint to indicate improvement in ability to perform ADLs.    Time 4    Period Weeks    Status New    Target Date 02/21/21      PT LONG TERM GOAL #3   Title Patient will have LT shoulder flexion AROM at least 105 degrees for improved ability for dressing and ADLs    Time 4    Period Weeks    Status New    Target Date 02/21/21                   Plan - 01/26/21 1156     Clinical Impression Statement Patient tolerated session well overall. Initiated ther ex and performed dry needling. Patient guarded with needling latissimus, but did show 4 degrees improved shoulder flexion AROM following. Patient with noted restriction in shoulder ER AROM, improved significantly after shoulder ER stretch with correcting of form. Issued updated HEP handout. Patient will continue to benefit from skilled therapy services to reduce deficits and improve functional level.  Examination-Activity Limitations Reach Overhead;Carry;Lift;Dressing;Hygiene/Grooming    Examination-Participation Restrictions Cleaning;Driving;Yard Work;Meal  Prep;Laundry    Stability/Clinical Decision Making Stable/Uncomplicated    Rehab Potential Good    PT Frequency 2x / week    PT Duration 4 weeks    PT Treatment/Interventions ADLs/Self Care Home Management;Biofeedback;Cryotherapy;Electrical Stimulation;Therapeutic exercise;Therapeutic activities;Ultrasound;Traction;Moist Heat;Iontophoresis 4mg /ml Dexamethasone;DME Instruction;Patient/family education;Manual techniques;Compression bandaging;Orthotic Fit/Training;Energy conservation;Splinting;Taping;Vasopneumatic Device;Joint Manipulations;Spinal Manipulations;Dry needling;Passive range of motion;Scar mobilization;Visual/perceptual remediation/compensation;Balance training    PT Next Visit Plan Progress postural strength, thoracic and GHJ mobility as tolerated. F/U about DN. Manual PROM and STM as needed    PT Home Exercise Plan Eval: reviewed previous OT HEP 1/26 band rows, OH flexion stretcth (supine), shoulder ER    Consulted and Agree with Plan of Care Patient             Patient will benefit from skilled therapeutic intervention in order to improve the following deficits and impairments:  Pain, Improper body mechanics, Increased fascial restricitons, Impaired flexibility, Postural dysfunction, Decreased activity tolerance, Decreased range of motion, Decreased strength, Impaired UE functional use  Visit Diagnosis: Stiffness of left shoulder, not elsewhere classified  Acute pain of left shoulder     Problem List Patient Active Problem List   Diagnosis Date Noted   Malignant neoplasm of upper-inner quadrant of left breast in female, estrogen receptor positive ([place]) 03/05/2017   History of colonic polyps 10/31/2015   High cholesterol 10/17/2015   SHOULDER, ARTHRITIS, DEGEN./OSTEO 02/28/2009   CONTRACTURE OF SHOULDER JOINT 02/16/2009   SHOULDER PAIN 02/16/2009   12:02 PM, 01/26/21 Breanna Hill PT DPT  Physical Therapist with [facility]  [phone]  [place], [place], [address] Phone: [phone]   Fax:  [phone]  Name: Breanna Hill MRN: [id number] Date of Birth: 1940-10-19

## 2021-01-27 ENCOUNTER — Encounter (HOSPITAL_COMMUNITY): Payer: Medicare HMO | Admitting: Occupational Therapy

## 2021-01-31 ENCOUNTER — Ambulatory Visit (HOSPITAL_COMMUNITY): Payer: Medicare HMO | Admitting: Physical Therapy

## 2021-01-31 ENCOUNTER — Encounter (HOSPITAL_COMMUNITY): Payer: Medicare HMO

## 2021-01-31 ENCOUNTER — Other Ambulatory Visit: Payer: Self-pay

## 2021-01-31 ENCOUNTER — Encounter (HOSPITAL_COMMUNITY): Payer: Self-pay | Admitting: Physical Therapy

## 2021-01-31 DIAGNOSIS — M25612 Stiffness of left shoulder, not elsewhere classified: Secondary | ICD-10-CM | POA: Diagnosis not present

## 2021-01-31 DIAGNOSIS — R29898 Other symptoms and signs involving the musculoskeletal system: Secondary | ICD-10-CM | POA: Diagnosis not present

## 2021-01-31 DIAGNOSIS — M25512 Pain in left shoulder: Secondary | ICD-10-CM

## 2021-01-31 NOTE — Therapy (Signed)
[place], [place], [address] Phone: [phone]   Fax:  [phone]  Physical Therapy Treatment  Patient Details  Name: Breanna Hill MRN: [id number] Date of Birth: 1940-08-12 Referring Provider (PT): Ophelia Charter MD   Encounter Date: 01/31/2021   PT End of Session - 01/31/21 0834     Visit Number 3    Number of Visits 8    Date for PT Re-Evaluation 02/21/21    Authorization Type [employer] Medicare HMO, [place] 2ndary    PT Start Time [phone]    PT Stop Time 0912    PT Time Calculation (min) 39 min    Activity Tolerance Patient tolerated treatment well    Behavior During Therapy [facility] for tasks assessed/performed             Past Medical History:  Diagnosis Date   Arthritis    R hand    Breast cancer ([place])    Breast disorder    cancer   Cancer ([place])    breast CA- diagnosed with needle biopsy   Depression    GERD (gastroesophageal reflux disease)    rare use of tums    High cholesterol 10/17/2015   History of radiation therapy 03/27/17- 04/23/17   Left Breast, 2.67 Gy in 15 fractions for a total dose of 40.05 Gy. Boost, 2 Gy in 5 fractions for a total dose of 10 Gy.    Hypertension    Hypothyroidism    Pre-diabetes    Hgba1C- in the 6 's , per pt., she monitors her diet    Past Surgical History:  Procedure Laterality Date   BACK SURGERY  1973   BREAST LUMPECTOMY Left 02/2017   BREAST LUMPECTOMY WITH RADIOACTIVE SEED AND SENTINEL LYMPH NODE BIOPSY Left 02/27/2017   Procedure: LEFT BREAST LUMPECTOMY WITH RADIOACTIVE SEED AND LEFT AXILLARY SENTINEL LYMPH NODE BIOPSY;  Surgeon: Donnie Mesa, MD;  Location: [facility];  Service: General;  Laterality: Left;   COLONOSCOPY     X 2   COLONOSCOPY N/A 02/22/2016   Procedure: COLONOSCOPY;  Surgeon: Rogene Houston, MD;  [facility];  Service: Endoscopy;  Laterality: N/A;  1:45   EYE SURGERY Bilateral    phlebpheroplasty   Left shoultder  surgery     for bone spurs & frozen shoulder    REVERSE SHOULDER ARTHROPLASTY Left 09/14/2020   Procedure: REVERSE SHOULDER ARTHROPLASTY;  Surgeon: Hiram Gash, MD;  [facility];  Service: Orthopedics;  Laterality: Left;   Right bunionectomy     TONSILLECTOMY     TUBAL LIGATION      There were no vitals filed for this visit.   Subjective Assessment - 01/31/21 0834     Subjective Shoulder is hurting a little bit. Did some exercises right before bed which made shoulder sore. Is able to get hand up to head. Continued decreased strength in L arm and hand.    Pertinent History LT TSA    Limitations Lifting;House hold activities;Other (comment)    Patient Stated Goals Be back to 100%, reach overhead and dry hair    Currently in Pain? Yes    Pain Score 3     Pain Location Shoulder    Pain Orientation Left    Pain Descriptors / Indicators Aching;Sore    Pain Type Chronic pain    Pain Onset Today                [facility]  PT Assessment - 01/31/21 0001       Assessment   Medical Diagnosis LT shoulder pain    Referring Provider (PT) Ophelia Charter MD      AROM   Left Shoulder Flexion 103 Degrees   with shoulder hike; 110 at end of session with shoulder hike in standing   Left Shoulder ABduction 91 Degrees   with shoulder hike     Palpation   Palpation comment very tender to L UT, unable to tolerate much pressure                           [facility] Adult PT Treatment/Exercise - 01/31/21 0001       Shoulder Exercises: Supine   Other Supine Exercises scap retraction with GH ER 3x 10 red band, shoulder flexion with band between hands 3x 10 red band, horizontal abduction 2x 10 red band      Shoulder Exercises: Standing   Other Standing Exercises lat/shoulder flexion stretch at counter 5 x 10 second holds                     PT Education - 01/31/21 0834     Education Details HEP    Person(s) Educated Patient    Methods Explanation;Demonstration     Comprehension Verbalized understanding;Returned demonstration              PT Short Term Goals - 01/24/21 1024       PT SHORT TERM GOAL #1   Title Patient will be independent with initial HEP and self-management strategies to improve functional outcomes    Time 2    Period Weeks    Status New    Target Date 02/07/21               PT Long Term Goals - 01/24/21 1025       PT LONG TERM GOAL #1   Title Patient will be independent with Advanced HEP and self-management strategies to improve functional outcomes    Time 4    Period Weeks    Status New    Target Date 02/21/21      PT LONG TERM GOAL #2   Title Patient will report at least 65% overall improvement in subjective complaint to indicate improvement in ability to perform ADLs.    Time 4    Period Weeks    Status New    Target Date 02/21/21      PT LONG TERM GOAL #3   Title Patient will have LT shoulder flexion AROM at least 105 degrees for improved ability for dressing and ADLs    Time 4    Period Weeks    Status New    Target Date 02/21/21                   Plan - 01/31/21 0834     Clinical Impression Statement Patient demonstrating improving ROM but continues to be limited with extensive shoulder hike. Patient very TTP in L UT and unable to tolerate much pressure. Held off on DN today as patient unable to tolerate grip of UT necessary to perform. Continues to demonstrate ER weakness and unable to perform band exercise in standing, able to perform in supine in limited range. Began decompression with band exercises for periscap and shoulder strength. Intermittent cueing for mechanics and controlled reps. Patient will continue to benefit from skilled physical therapy in order to reduce impairment and  improve function.    Examination-Activity Limitations Reach Overhead;Carry;Lift;Dressing;Hygiene/Grooming    Examination-Participation Restrictions Cleaning;Driving;Yard Work;Meal Prep;Laundry     Stability/Clinical Decision Making Stable/Uncomplicated    Rehab Potential Good    PT Frequency 2x / week    PT Duration 4 weeks    PT Treatment/Interventions ADLs/Self Care Home Management;Biofeedback;Cryotherapy;Electrical Stimulation;Therapeutic exercise;Therapeutic activities;Ultrasound;Traction;Moist Heat;Iontophoresis 29m/ml Dexamethasone;DME Instruction;Patient/family education;Manual techniques;Compression bandaging;Orthotic Fit/Training;Energy conservation;Splinting;Taping;Vasopneumatic Device;Joint Manipulations;Spinal Manipulations;Dry needling;Passive range of motion;Scar mobilization;Visual/perceptual remediation/compensation;Balance training    PT Next Visit Plan Progress postural strength, thoracic and GHJ mobility as tolerated. F/U about DN. Manual PROM and STM as needed    PT Home Exercise Plan Eval: reviewed previous OT HEP 1/26 band rows, OH flexion stretcth (supine), shoulder ER 1/31 GCongerER with scap ret in supine, horizontal abd, shoulder flexion with band in hands - all supine    Consulted and Agree with Plan of Care Patient             Patient will benefit from skilled therapeutic intervention in order to improve the following deficits and impairments:  Pain, Improper body mechanics, Increased fascial restricitons, Impaired flexibility, Postural dysfunction, Decreased activity tolerance, Decreased range of motion, Decreased strength, Impaired UE functional use  Visit Diagnosis: Stiffness of left shoulder, not elsewhere classified  Acute pain of left shoulder     Problem List Patient Active Problem List   Diagnosis Date Noted   Malignant neoplasm of upper-inner quadrant of left breast in female, estrogen receptor positive ([place] 03/05/2017   History of colonic polyps 10/31/2015   High cholesterol 10/17/2015   SHOULDER, ARTHRITIS, DEGEN./OSTEO 02/28/2009   CONTRACTURE OF SHOULDER JOINT 02/16/2009   SHOULDER PAIN 02/16/2009    9:13 AM, 01/31/21 AMearl LatinPT, DPT Physical [place] [address]Phone: [phone]  Fax:  [phone] Name: BJAELLE CAMPANILEMRN: [id number] of Birth: 1Apr 15, 1942

## 2021-02-02 ENCOUNTER — Ambulatory Visit (HOSPITAL_COMMUNITY): Payer: Medicare HMO | Attending: Orthopaedic Surgery | Admitting: Physical Therapy

## 2021-02-02 ENCOUNTER — Other Ambulatory Visit: Payer: Self-pay

## 2021-02-02 ENCOUNTER — Ambulatory Visit (HOSPITAL_COMMUNITY): Payer: Medicare HMO | Admitting: Physical Therapy

## 2021-02-02 ENCOUNTER — Encounter (HOSPITAL_COMMUNITY): Payer: Medicare HMO | Admitting: Occupational Therapy

## 2021-02-02 ENCOUNTER — Encounter (HOSPITAL_COMMUNITY): Payer: Self-pay | Admitting: Physical Therapy

## 2021-02-02 DIAGNOSIS — M25512 Pain in left shoulder: Secondary | ICD-10-CM

## 2021-02-02 DIAGNOSIS — R29898 Other symptoms and signs involving the musculoskeletal system: Secondary | ICD-10-CM | POA: Insufficient documentation

## 2021-02-02 DIAGNOSIS — M25612 Stiffness of left shoulder, not elsewhere classified: Secondary | ICD-10-CM | POA: Diagnosis not present

## 2021-02-02 NOTE — Therapy (Signed)
[place], [place], [address] Phone: [phone]   Fax:  [phone]  Physical Therapy Treatment  Patient Details  Name: Breanna Hill MRN: [id number] Date of Birth: 18-May-1940 Referring Provider (PT): Ophelia Charter MD   Encounter Date: 02/02/2021   PT End of Session - 02/02/21 1313     Visit Number 4    Number of Visits 8    Date for PT Re-Evaluation 02/21/21    Authorization Type Aetna Medicare HMO, [place] 2ndary    PT Start Time 1309   Add on, scheduled at 945A, arrived at 1P   PT Stop Time 1348    PT Time Calculation (min) 39 min    Activity Tolerance Patient tolerated treatment well    Behavior During Therapy [facility] for tasks assessed/performed             Past Medical History:  Diagnosis Date   Arthritis    R hand    Breast cancer ([place])    Breast disorder    cancer   Cancer ([place])    breast CA- diagnosed with needle biopsy   Depression    GERD (gastroesophageal reflux disease)    rare use of tums    High cholesterol 10/17/2015   History of radiation therapy 03/27/17- 04/23/17   Left Breast, 2.67 Gy in 15 fractions for a total dose of 40.05 Gy. Boost, 2 Gy in 5 fractions for a total dose of 10 Gy.    Hypertension    Hypothyroidism    Pre-diabetes    Hgba1C- in the 6 's , per pt., she monitors her diet    Past Surgical History:  Procedure Laterality Date   BACK SURGERY  1973   BREAST LUMPECTOMY Left 02/2017   BREAST LUMPECTOMY WITH RADIOACTIVE SEED AND SENTINEL LYMPH NODE BIOPSY Left 02/27/2017   Procedure: LEFT BREAST LUMPECTOMY WITH RADIOACTIVE SEED AND LEFT AXILLARY SENTINEL LYMPH NODE BIOPSY;  Surgeon: Donnie Mesa, MD;  Location: [facility];  Service: General;  Laterality: Left;   COLONOSCOPY     X 2   COLONOSCOPY N/A 02/22/2016   Procedure: COLONOSCOPY;  Surgeon: Rogene Houston, MD;  [facility];  Service: Endoscopy;  Laterality: N/A;  1:45   EYE SURGERY Bilateral     phlebpheroplasty   Left shoultder surgery     for bone spurs & frozen shoulder    REVERSE SHOULDER ARTHROPLASTY Left 09/14/2020   Procedure: REVERSE SHOULDER ARTHROPLASTY;  Surgeon: Hiram Gash, MD;  [facility];  Service: Orthopedics;  Laterality: Left;   Right bunionectomy     TONSILLECTOMY     TUBAL LIGATION      There were no vitals filed for this visit.   Subjective Assessment - 02/02/21 1312     Subjective Feeling a little better. Had some fatigue and muscle soreness after last time.    Pertinent History LT TSA    Limitations Lifting;House hold activities;Other (comment)    Patient Stated Goals Be back to 100%, reach overhead and dry hair    Currently in Pain? Yes    Pain Score 2     Pain Location Shoulder    Pain Orientation Left    Pain Descriptors / Indicators Aching;Sore    Pain Type Chronic pain    Pain Onset Today  [place] Adult PT Treatment/Exercise - 02/02/21 0001       Shoulder Exercises: Standing   Other Standing Exercises RTB ER shoulder iso 10 x 5", Shoulder IR RTB x10, RTB rows and extensions 15x each      Manual Therapy   Manual Therapy Soft tissue mobilization;Passive ROM    Manual therapy comments completed separately from therapeutic exercises    Soft tissue mobilization IASTM using theragun Lv 10 to LT latissimus, teres, upper trap and external rotators patient in RT side lying    Passive ROM LT GHJ PROM flexion, abduction and external rotation                       PT Short Term Goals - 01/24/21 1024       PT SHORT TERM GOAL #1   Title Patient will be independent with initial HEP and self-management strategies to improve functional outcomes    Time 2    Period Weeks    Status New    Target Date 02/07/21               PT Long Term Goals - 01/24/21 1025       PT LONG TERM GOAL #1   Title Patient will be independent with Advanced HEP and self-management strategies to  improve functional outcomes    Time 4    Period Weeks    Status New    Target Date 02/21/21      PT LONG TERM GOAL #2   Title Patient will report at least 65% overall improvement in subjective complaint to indicate improvement in ability to perform ADLs.    Time 4    Period Weeks    Status New    Target Date 02/21/21      PT LONG TERM GOAL #3   Title Patient will have LT shoulder flexion AROM at least 105 degrees for improved ability for dressing and ADLs    Time 4    Period Weeks    Status New    Target Date 02/21/21                   Plan - 02/02/21 1344     Clinical Impression Statement Patient continued to demo significant sensitivity about LT upper trap and latissimus area. Also continues to demo significant external rotation weakness. Added banded shoulder ER isometrics. Patient required moderate cueing and demo for proper form. Fatigues very quickly. Performed IASTM to specified areas to reduce sensitivity and improve fascial mobility. Patient did report decreased tenderness to palpation following. Shoulder AROM remains around 97 degrees at this time.    Examination-Activity Limitations Reach Overhead;Carry;Lift;Dressing;Hygiene/Grooming    Examination-Participation Restrictions Cleaning;Driving;Yard Work;Meal Prep;Laundry    Stability/Clinical Decision Making Stable/Uncomplicated    Rehab Potential Good    PT Frequency 2x / week    PT Duration 4 weeks    PT Treatment/Interventions ADLs/Self Care Home Management;Biofeedback;Cryotherapy;Electrical Stimulation;Therapeutic exercise;Therapeutic activities;Ultrasound;Traction;Moist Heat;Iontophoresis 79m/ml Dexamethasone;DME Instruction;Patient/family education;Manual techniques;Compression bandaging;Orthotic Fit/Training;Energy conservation;Splinting;Taping;Vasopneumatic Device;Joint Manipulations;Spinal Manipulations;Dry needling;Passive range of motion;Scar mobilization;Visual/perceptual remediation/compensation;Balance  training    PT Next Visit Plan Progress postural strength, thoracic and GHJ mobility as tolerated. F/U about DN. Manual PROM and STM as needed    PT Home Exercise Plan Eval: reviewed previous OT HEP 1/26 band rows, OH flexion stretcth (supine), shoulder ER 1/31 GH ER with scap ret in supine, horizontal abd, shoulder flexion with band in hands - all supine 2/2 shoulder ER iso    Consulted  and Agree with Plan of Care Patient             Patient will benefit from skilled therapeutic intervention in order to improve the following deficits and impairments:  Pain, Improper body mechanics, Increased fascial restricitons, Impaired flexibility, Postural dysfunction, Decreased activity tolerance, Decreased range of motion, Decreased strength, Impaired UE functional use  Visit Diagnosis: Stiffness of left shoulder, not elsewhere classified  Acute pain of left shoulder     Problem List Patient Active Problem List   Diagnosis Date Noted   Malignant neoplasm of upper-inner quadrant of left breast in female, estrogen receptor positive ([place]) 03/05/2017   History of colonic polyps 10/31/2015   High cholesterol 10/17/2015   SHOULDER, ARTHRITIS, DEGEN./OSTEO 02/28/2009   CONTRACTURE OF SHOULDER JOINT 02/16/2009   SHOULDER PAIN 02/16/2009   1:49 PM, 02/02/21 Josue Hector PT DPT  Physical Therapist with [facility]  [phone] [place], [place], [address] Phone: [phone]   Fax:  [phone]  Name: Breanna Hill MRN: [id number] Date of Birth: [birth year]

## 2021-02-07 ENCOUNTER — Ambulatory Visit (HOSPITAL_COMMUNITY): Payer: Medicare HMO | Admitting: Physical Therapy

## 2021-02-07 ENCOUNTER — Encounter (HOSPITAL_COMMUNITY): Payer: Self-pay | Admitting: Physical Therapy

## 2021-02-07 ENCOUNTER — Other Ambulatory Visit: Payer: Self-pay

## 2021-02-07 DIAGNOSIS — M25512 Pain in left shoulder: Secondary | ICD-10-CM | POA: Diagnosis not present

## 2021-02-07 DIAGNOSIS — R29898 Other symptoms and signs involving the musculoskeletal system: Secondary | ICD-10-CM | POA: Diagnosis not present

## 2021-02-07 DIAGNOSIS — M25612 Stiffness of left shoulder, not elsewhere classified: Secondary | ICD-10-CM

## 2021-02-07 NOTE — Therapy (Signed)
[place], [place], [address] Phone: [phone]   Fax:  [phone]  Physical Therapy Treatment  Patient Details  Name: Breanna Hill MRN: [id number] Date of Birth: 1940-03-17 Referring Provider (PT): Ophelia Charter MD   Encounter Date: 02/07/2021   PT End of Session - 02/07/21 1002     Visit Number 5    Number of Visits 8    Date for PT Re-Evaluation 02/21/21    Authorization Type Aetna Medicare HMO, [place] 2ndary    PT Start Time 1002    PT Stop Time 1040    PT Time Calculation (min) 38 min    Activity Tolerance Patient tolerated treatment well    Behavior During Therapy [facility] for tasks assessed/performed             Past Medical History:  Diagnosis Date   Arthritis    R hand    Breast cancer ([place])    Breast disorder    cancer   Cancer ([place])    breast CA- diagnosed with needle biopsy   Depression    GERD (gastroesophageal reflux disease)    rare use of tums    High cholesterol 10/17/2015   History of radiation therapy 03/27/17- 04/23/17   Left Breast, 2.67 Gy in 15 fractions for a total dose of 40.05 Gy. Boost, 2 Gy in 5 fractions for a total dose of 10 Gy.    Hypertension    Hypothyroidism    Pre-diabetes    Hgba1C- in the 6 's , per pt., she monitors her diet    Past Surgical History:  Procedure Laterality Date   BACK SURGERY  1973   BREAST LUMPECTOMY Left 02/2017   BREAST LUMPECTOMY WITH RADIOACTIVE SEED AND SENTINEL LYMPH NODE BIOPSY Left 02/27/2017   Procedure: LEFT BREAST LUMPECTOMY WITH RADIOACTIVE SEED AND LEFT AXILLARY SENTINEL LYMPH NODE BIOPSY;  Surgeon: Donnie Mesa, MD;  Location: [facility];  Service: General;  Laterality: Left;   COLONOSCOPY     X 2   COLONOSCOPY N/A 02/22/2016   Procedure: COLONOSCOPY;  Surgeon: Rogene Houston, MD;  [facility];  Service: Endoscopy;  Laterality: N/A;  1:45   EYE SURGERY Bilateral    phlebpheroplasty   Left shoultder surgery      for bone spurs & frozen shoulder    REVERSE SHOULDER ARTHROPLASTY Left 09/14/2020   Procedure: REVERSE SHOULDER ARTHROPLASTY;  Surgeon: Hiram Gash, MD;  [facility];  Service: Orthopedics;  Laterality: Left;   Right bunionectomy     TONSILLECTOMY     TUBAL LIGATION      There were no vitals filed for this visit.   Subjective Assessment - 02/07/21 1004     Subjective been a little sore but not bad.    Pertinent History LT TSA    Limitations Lifting;House hold activities;Other (comment)    Patient Stated Goals Be back to 100%, reach overhead and dry hair    Currently in Pain? Yes    Pain Score 3     Pain Location Shoulder    Pain Orientation Left    Pain Descriptors / Indicators Aching;Sore    Pain Type Chronic pain                [facility] PT Assessment - 02/07/21 0001       AROM   Right Shoulder Flexion 132 Degrees    Right Shoulder External Rotation --   T3  Left Shoulder Flexion 110 Degrees   122 supine with band between hands at end of session; 110 with shoulder hike at end of session   Left Shoulder External Rotation --   C7                          [facility] Adult PT Treatment/Exercise - 02/07/21 0001       Shoulder Exercises: Supine   Other Supine Exercises shoulder flexion with band between hands 3x 10 green band      Shoulder Exercises: Standing   Other Standing Exercises RTB ER shoulder iso 10 x 5" with walkouts, Shoulder IR RTB 2x10, RTB rows and extensions 15x each      Manual Therapy   Manual Therapy Joint mobilization    Manual therapy comments completed separately from therapeutic exercises    Joint Mobilization Grade 1-2 AP with slight ER at 45 abd    Passive ROM LT GHJ PROM flexion, abduction and external rotation                     PT Education - 02/07/21 1004     Education Details HEP    Person(s) Educated Patient    Methods Explanation;Demonstration    Comprehension Verbalized understanding;Returned  demonstration              PT Short Term Goals - 01/24/21 1024       PT SHORT TERM GOAL #1   Title Patient will be independent with initial HEP and self-management strategies to improve functional outcomes    Time 2    Period Weeks    Status New    Target Date 02/07/21               PT Long Term Goals - 01/24/21 1025       PT LONG TERM GOAL #1   Title Patient will be independent with Advanced HEP and self-management strategies to improve functional outcomes    Time 4    Period Weeks    Status New    Target Date 02/21/21      PT LONG TERM GOAL #2   Title Patient will report at least 65% overall improvement in subjective complaint to indicate improvement in ability to perform ADLs.    Time 4    Period Weeks    Status New    Target Date 02/21/21      PT LONG TERM GOAL #3   Title Patient will have LT shoulder flexion AROM at least 105 degrees for improved ability for dressing and ADLs    Time 4    Period Weeks    Status New    Target Date 02/21/21                   Plan - 02/07/21 1003     Clinical Impression Statement Patient continues to demonstrate limited ER and overhead mobility. No change following gentle joint mobilizations and PROM. Requires cueing for previously completed band exercises due to questions about mechanics. Flexion AROM to 110 with shoulder hike at end of session, Flexion AROM in supine 122.  Patient will continue to benefit from skilled physical therapy in order to reduce impairment and improve function.    Examination-Activity Limitations Reach Overhead;Carry;Lift;Dressing;Hygiene/Grooming    Examination-Participation Restrictions Cleaning;Driving;Yard Work;Meal Prep;Laundry    Stability/Clinical Decision Making Stable/Uncomplicated    Rehab Potential Good    PT Frequency 2x / week  PT Duration 4 weeks    PT Treatment/Interventions ADLs/Self Care Home Management;Biofeedback;Cryotherapy;Electrical Stimulation;Therapeutic  exercise;Therapeutic activities;Ultrasound;Traction;Moist Heat;Iontophoresis 33m/ml Dexamethasone;DME Instruction;Patient/family education;Manual techniques;Compression bandaging;Orthotic Fit/Training;Energy conservation;Splinting;Taping;Vasopneumatic Device;Joint Manipulations;Spinal Manipulations;Dry needling;Passive range of motion;Scar mobilization;Visual/perceptual remediation/compensation;Balance training    PT Next Visit Plan Progress postural strength, thoracic and GHJ mobility as tolerated. F/U about DN. Manual PROM and STM as needed    PT Home Exercise Plan Eval: reviewed previous OT HEP 1/26 band rows, OH flexion stretcth (supine), shoulder ER 1/31 G[place]ER with scap ret in supine, horizontal abd, shoulder flexion with band in hands - all supine 2/2 shoulder ER iso    Consulted and Agree with Plan of Care Patient             Patient will benefit from skilled therapeutic intervention in order to improve the following deficits and impairments:  Pain, Improper body mechanics, Increased fascial restricitons, Impaired flexibility, Postural dysfunction, Decreased activity tolerance, Decreased range of motion, Decreased strength, Impaired UE functional use  Visit Diagnosis: Stiffness of left shoulder, not elsewhere classified  Acute pain of left shoulder  Other symptoms and signs involving the musculoskeletal system     Problem List Patient Active Problem List   Diagnosis Date Noted   Malignant neoplasm of upper-inner quadrant of left breast in female, estrogen receptor positive ([place] 03/05/2017   History of colonic polyps 10/31/2015   High cholesterol 10/17/2015   SHOULDER, ARTHRITIS, DEGEN./OSTEO 02/28/2009   CONTRACTURE OF SHOULDER JOINT 02/16/2009   SHOULDER PAIN 02/16/2009    10:46 AM, 02/07/21 AMearl LatinPT, DPT Physical Therapist [place]  [address]Phone: [phone]  Fax:  [phone] Name: Breanna CARBONELLMRN: [id number] of Birth: 108/31/1942

## 2021-02-09 ENCOUNTER — Other Ambulatory Visit: Payer: Self-pay

## 2021-02-09 ENCOUNTER — Encounter (HOSPITAL_COMMUNITY): Payer: Self-pay | Admitting: Physical Therapy

## 2021-02-09 ENCOUNTER — Ambulatory Visit (HOSPITAL_COMMUNITY): Payer: Medicare HMO | Admitting: Physical Therapy

## 2021-02-09 DIAGNOSIS — M25512 Pain in left shoulder: Secondary | ICD-10-CM

## 2021-02-09 DIAGNOSIS — R29898 Other symptoms and signs involving the musculoskeletal system: Secondary | ICD-10-CM | POA: Diagnosis not present

## 2021-02-09 DIAGNOSIS — M25612 Stiffness of left shoulder, not elsewhere classified: Secondary | ICD-10-CM | POA: Diagnosis not present

## 2021-02-09 NOTE — Therapy (Signed)
[place], [place], [address] Phone: [phone]   Fax:  [phone]  Physical Therapy Treatment  Patient Details  Name: Breanna Hill MRN: [id number] Date of Birth: Mar 08, 1940 Referring Provider (PT): Ophelia Charter MD   Encounter Date: 02/09/2021   PT End of Session - 02/09/21 0952     Visit Number 6    Number of Visits 8    Date for PT Re-Evaluation 02/21/21    Authorization Type [employer] Medicare HMO, [place] 2ndary    PT Start Time [phone]    PT Stop Time 1032    PT Time Calculation (min) 44 min    Activity Tolerance Patient tolerated treatment well    Behavior During Therapy [facility] for tasks assessed/performed             Past Medical History:  Diagnosis Date   Arthritis    R hand    Breast cancer ([place])    Breast disorder    cancer   Cancer ([place])    breast CA- diagnosed with needle biopsy   Depression    GERD (gastroesophageal reflux disease)    rare use of tums    High cholesterol 10/17/2015   History of radiation therapy 03/27/17- 04/23/17   Left Breast, 2.67 Gy in 15 fractions for a total dose of 40.05 Gy. Boost, 2 Gy in 5 fractions for a total dose of 10 Gy.    Hypertension    Hypothyroidism    Pre-diabetes    Hgba1C- in the 6 's , per pt., she monitors her diet    Past Surgical History:  Procedure Laterality Date   BACK SURGERY  1973   BREAST LUMPECTOMY Left 02/2017   BREAST LUMPECTOMY WITH RADIOACTIVE SEED AND SENTINEL LYMPH NODE BIOPSY Left 02/27/2017   Procedure: LEFT BREAST LUMPECTOMY WITH RADIOACTIVE SEED AND LEFT AXILLARY SENTINEL LYMPH NODE BIOPSY;  Surgeon: Donnie Mesa, MD;  Location: [facility];  Service: General;  Laterality: Left;   COLONOSCOPY     X 2   COLONOSCOPY N/A 02/22/2016   Procedure: COLONOSCOPY;  Surgeon: Rogene Houston, MD;  [facility];  Service: Endoscopy;  Laterality: N/A;  1:45   EYE SURGERY Bilateral    phlebpheroplasty   Left shoultder surgery      for bone spurs & frozen shoulder    REVERSE SHOULDER ARTHROPLASTY Left 09/14/2020   Procedure: REVERSE SHOULDER ARTHROPLASTY;  Surgeon: Hiram Gash, MD;  [facility];  Service: Orthopedics;  Laterality: Left;   Right bunionectomy     TONSILLECTOMY     TUBAL LIGATION      There were no vitals filed for this visit.   Subjective Assessment - 02/09/21 0951     Subjective Shoulder is doing better, still not 100%.    Pertinent History LT TSA    Limitations Lifting;House hold activities;Other (comment)    Patient Stated Goals Be back to 100%, reach overhead and dry hair    Currently in Pain? No/denies                               [facility] Adult PT Treatment/Exercise - 02/09/21 0001       Shoulder Exercises: Supine   Other Supine Exercises shoulder flexion stretch with palms up 10 x 10, shoulder ER stretch 10 x 10"      Shoulder Exercises: Seated   Other Seated Exercises UBE 2/2  FWD Back      Shoulder Exercises: Sidelying   External Rotation Left;20 reps    Flexion Left;20 reps    ABduction Left;20 reps      Manual Therapy   Manual Therapy Soft tissue mobilization;Passive ROM    Manual therapy comments completed separately from therapeutic exercises    Soft tissue mobilization STM to LT latissimus pre and post dry needling for trigger point ID and surface area prep    Passive ROM LT GHJ PROM flexion, abduction and external rotation              Trigger Point Dry Needling - 02/09/21 0001     Consent Given? Yes    Education Handout Provided Previously provided    Muscles Treated Upper Quadrant Latissimus dorsi    Dry Needling Comments 1 needle x 2 to LT lat patient in supine    Other Dry Needling good tolerance, though muscle garding with needling Lat    Upper Trapezius Response Palpable increased muscle length                     PT Short Term Goals - 01/24/21 1024       PT SHORT TERM GOAL #1   Title Patient will be  independent with initial HEP and self-management strategies to improve functional outcomes    Time 2    Period Weeks    Status New    Target Date 02/07/21               PT Long Term Goals - 01/24/21 1025       PT LONG TERM GOAL #1   Title Patient will be independent with Advanced HEP and self-management strategies to improve functional outcomes    Time 4    Period Weeks    Status New    Target Date 02/21/21      PT LONG TERM GOAL #2   Title Patient will report at least 65% overall improvement in subjective complaint to indicate improvement in ability to perform ADLs.    Time 4    Period Weeks    Status New    Target Date 02/21/21      PT LONG TERM GOAL #3   Title Patient will have LT shoulder flexion AROM at least 105 degrees for improved ability for dressing and ADLs    Time 4    Period Weeks    Status New    Target Date 02/21/21                   Plan - 02/09/21 1031     Clinical Impression Statement Patient continues to have trigger points and guarding in LT latissimus which is impacting overhead shoulder mobility. Patient requires verbal cues for proper mechanics with shoulder ER stretching from HEP. Improved mobility following manual and corrected form with stretching exercise. Also improved lat muscle flexibility following dry needling. Overhead AROM currently 100 degrees, within 10 degrees of contralateral side. Patient will continue to benefit from skilled therapy services to reduce deficits and improve functional ability.    Examination-Activity Limitations Reach Overhead;Carry;Lift;Dressing;Hygiene/Grooming    Examination-Participation Restrictions Cleaning;Driving;Yard Work;Meal Prep;Laundry    Stability/Clinical Decision Making Stable/Uncomplicated    Rehab Potential Good    PT Frequency 2x / week    PT Duration 4 weeks    PT Treatment/Interventions ADLs/Self Care Home Management;Biofeedback;Cryotherapy;Electrical Stimulation;Therapeutic  exercise;Therapeutic activities;Ultrasound;Traction;Moist Heat;Iontophoresis 55m/ml Dexamethasone;DME Instruction;Patient/family education;Manual techniques;Compression bandaging;Orthotic Fit/Training;Energy conservation;Splinting;Taping;Vasopneumatic Device;Joint Manipulations;Spinal Manipulations;Dry needling;Passive range  of motion;Scar mobilization;Visual/perceptual remediation/compensation;Balance training    PT Next Visit Plan Progress postural strength, thoracic and GHJ mobility as tolerated. F/U about DN. Manual PROM and STM as needed    PT Home Exercise Plan Eval: reviewed previous OT HEP 1/26 band rows, OH flexion stretcth (supine), shoulder ER 1/31 [place] ER with scap ret in supine, horizontal abd, shoulder flexion with band in hands - all supine 2/2 shoulder ER iso    Consulted and Agree with Plan of Care Patient             Patient will benefit from skilled therapeutic intervention in order to improve the following deficits and impairments:  Pain, Improper body mechanics, Increased fascial restricitons, Impaired flexibility, Postural dysfunction, Decreased activity tolerance, Decreased range of motion, Decreased strength, Impaired UE functional use  Visit Diagnosis: Stiffness of left shoulder, not elsewhere classified  Acute pain of left shoulder  Other symptoms and signs involving the musculoskeletal system     Problem List Patient Active Problem List   Diagnosis Date Noted   Malignant neoplasm of upper-inner quadrant of left breast in female, estrogen receptor positive ([place]) 03/05/2017   History of colonic polyps 10/31/2015   High cholesterol 10/17/2015   SHOULDER, ARTHRITIS, DEGEN./OSTEO 02/28/2009   CONTRACTURE OF SHOULDER JOINT 02/16/2009   SHOULDER PAIN 02/16/2009   10:35 AM, 02/09/21 Josue Hector PT DPT  Physical Therapist with [facility]  [phone] [place], [place], [address] Phone: [phone]   Fax:  [phone]  Name: Breanna Hill MRN: [id number] Date of Birth: 1940-12-04

## 2021-02-13 ENCOUNTER — Ambulatory Visit (HOSPITAL_COMMUNITY): Payer: Medicare HMO | Admitting: Physical Therapy

## 2021-02-13 ENCOUNTER — Other Ambulatory Visit: Payer: Self-pay

## 2021-02-13 ENCOUNTER — Encounter (HOSPITAL_COMMUNITY): Payer: Self-pay | Admitting: Physical Therapy

## 2021-02-13 DIAGNOSIS — R29898 Other symptoms and signs involving the musculoskeletal system: Secondary | ICD-10-CM

## 2021-02-13 DIAGNOSIS — M25512 Pain in left shoulder: Secondary | ICD-10-CM | POA: Diagnosis not present

## 2021-02-13 DIAGNOSIS — M25612 Stiffness of left shoulder, not elsewhere classified: Secondary | ICD-10-CM | POA: Diagnosis not present

## 2021-02-13 NOTE — Therapy (Signed)
[place], [place], [address] Phone: [phone]   Fax:  [phone]  Physical Therapy Treatment  Patient Details  Name: Breanna Hill MRN: [id number] Date of Birth: Jul 01, 1940 Referring Provider (PT): Ophelia Charter MD   Encounter Date: 02/13/2021   PT End of Session - 02/13/21 0949     Visit Number 7    Number of Visits 8    Date for PT Re-Evaluation 02/21/21    Authorization Type [employer] Medicare HMO, [place] 2ndary    PT Start Time [id number]    PT Stop Time 1029    PT Time Calculation (min) 43 min    Activity Tolerance Patient tolerated treatment well    Behavior During Therapy [facility] for tasks assessed/performed             Past Medical History:  Diagnosis Date   Arthritis    R hand    Breast cancer ([place])    Breast disorder    cancer   Cancer ([place])    breast CA- diagnosed with needle biopsy   Depression    GERD (gastroesophageal reflux disease)    rare use of tums    High cholesterol 10/17/2015   History of radiation therapy 03/27/17- 04/23/17   Left Breast, 2.67 Gy in 15 fractions for a total dose of 40.05 Gy. Boost, 2 Gy in 5 fractions for a total dose of 10 Gy.    Hypertension    Hypothyroidism    Pre-diabetes    Hgba1C- in the 6 's , per pt., she monitors her diet    Past Surgical History:  Procedure Laterality Date   BACK SURGERY  1973   BREAST LUMPECTOMY Left 02/2017   BREAST LUMPECTOMY WITH RADIOACTIVE SEED AND SENTINEL LYMPH NODE BIOPSY Left 02/27/2017   Procedure: LEFT BREAST LUMPECTOMY WITH RADIOACTIVE SEED AND LEFT AXILLARY SENTINEL LYMPH NODE BIOPSY;  Surgeon: Donnie Mesa, MD;  Location: [facility];  Service: General;  Laterality: Left;   COLONOSCOPY     X 2   COLONOSCOPY N/A 02/22/2016   Procedure: COLONOSCOPY;  Surgeon: Rogene Houston, MD;  [facility];  Service: Endoscopy;  Laterality: N/A;  1:45   EYE SURGERY Bilateral    phlebpheroplasty   Left shoultder  surgery     for bone spurs & frozen shoulder    REVERSE SHOULDER ARTHROPLASTY Left 09/14/2020   Procedure: REVERSE SHOULDER ARTHROPLASTY;  Surgeon: Hiram Gash, MD;  [facility];  Service: Orthopedics;  Laterality: Left;   Right bunionectomy     TONSILLECTOMY     TUBAL LIGATION      There were no vitals filed for this visit.   Subjective Assessment - 02/13/21 0949     Subjective Doing good, no complaints. Moving shoulder more at home.    Pertinent History LT TSA    Limitations Lifting;House hold activities;Other (comment)    Patient Stated Goals Be back to 100%, reach overhead and dry hair    Currently in Pain? Yes    Pain Score 2     Pain Location Shoulder    Pain Orientation Left    Pain Descriptors / Indicators Aching;Sore    Pain Type Chronic pain                               [facility] Adult PT Treatment/Exercise - 02/13/21 0001       Shoulder Exercises:  Supine   Other Supine Exercises shoulder flexion stretch x10, thoracic mobilzation with horizontal towel roll 3 min, getting up from floor 5 min      Shoulder Exercises: Standing   Extension Both;20 reps;Theraband    Theraband Level (Shoulder Extension) Level 3 (Green)    Row [employer];Theraband    Theraband Level (Shoulder Row) Level 3 (Green)    Other Standing Exercises RTB ER shoulder iso 10 x 5" with walkouts, Shoulder IR RTB 2x10    Other Standing Exercises Standing LT shoulder ER stretch at doorway 5 x 10"      Shoulder Exercises: ROM/Strengthening   UBE (Upper Arm Bike) 2/2 FWD/ Reverse      Manual Therapy   Manual Therapy Soft tissue mobilization;Passive ROM    Manual therapy comments completed separately from therapeutic exercises    Joint Mobilization Grade 1-2 AP LT GHJ    Myofascial Release mm release to LT latissimus    Passive ROM LT GHJ PROM flexion, abduction and external rotation                       PT Short Term Goals - 01/24/21 1024       PT SHORT  TERM GOAL #1   Title Patient will be independent with initial HEP and self-management strategies to improve functional outcomes    Time 2    Period Weeks    Status New    Target Date 02/07/21               PT Long Term Goals - 01/24/21 1025       PT LONG TERM GOAL #1   Title Patient will be independent with Advanced HEP and self-management strategies to improve functional outcomes    Time 4    Period Weeks    Status New    Target Date 02/21/21      PT LONG TERM GOAL #2   Title Patient will report at least 65% overall improvement in subjective complaint to indicate improvement in ability to perform ADLs.    Time 4    Period Weeks    Status New    Target Date 02/21/21      PT LONG TERM GOAL #3   Title Patient will have LT shoulder flexion AROM at least 105 degrees for improved ability for dressing and ADLs    Time 4    Period Weeks    Status New    Target Date 02/21/21                   Plan - 02/13/21 1043     Clinical Impression Statement Patient continues to struggle with overhead ROM. This is mostly due to weakness as she has PROM in excess of 20 degrees beyond active. It is unclear how consistent patient is with strengthening HEP but reiterated importance of band exercises and shoulder ER isometrics to improve functional OH mobility. Spent some time educating patient on proper way to get up from floor, as she asked about this. She is afraid she wouldnt be able to get up if she fell. Did well with this with cues for mechanics, but would improve with LE strengthening also. Patient will continue to benefit from skilled therapy services to reduce deficits and improve functional level .    Examination-Activity Limitations Reach Overhead;Carry;Lift;Dressing;Hygiene/Grooming    Examination-Participation Restrictions Cleaning;Driving;Yard Work;Meal Prep;Laundry    Stability/Clinical Decision Making Stable/Uncomplicated    Rehab Potential Good  PT Frequency 2x /  week    PT Duration 4 weeks    PT Treatment/Interventions ADLs/Self Care Home Management;Biofeedback;Cryotherapy;Electrical Stimulation;Therapeutic exercise;Therapeutic activities;Ultrasound;Traction;Moist Heat;Iontophoresis 75m/ml Dexamethasone;DME Instruction;Patient/family education;Manual techniques;Compression bandaging;Orthotic Fit/Training;Energy conservation;Splinting;Taping;Vasopneumatic Device;Joint Manipulations;Spinal Manipulations;Dry needling;Passive range of motion;Scar mobilization;Visual/perceptual remediation/compensation;Balance training    PT Next Visit Plan Progress postural strength, thoracic and GHJ mobility as tolerated. F/U about DN. Manual PROM and STM as needed    PT Home Exercise Plan Eval: reviewed previous OT HEP 1/26 band rows, OH flexion stretcth (supine), [place] sc[place] in supine, horizontal abd, shoulder flexion with band in hands - all supine 2/2 shoulder ER iso    Consulted and Agree with Plan of Care Patient             Patient will benefit from skilled therapeutic intervention in order to improve the following deficits and impairments:  Pain, Improper body mechanics, Increased fascial restricitons, Impaired flexibility, Postural dysfunction, Decreased activity tolerance, Decreased range of motion, Decreased strength, Impaired UE functional use  Visit Diagnosis: Stiffness of left shoulder, not elsewhere classified  Acute pain of left shoulder  Other symptoms and signs involving the musculoskeletal system     Problem List Patient Active Problem List   Diagnosis Date Noted   Malignant neoplasm of upper-inner quadrant of left breast in [place], [place] 03/05/2017   History of colonic polyps 10/31/2015   High cholesterol 10/17/2015   SHOULDER, ARTHRITIS, DEGEN./OSTEO 02/28/2009   CONTRACTURE OF SHOULDER JOINT 02/16/2009   SHOULDER PAIN 02/16/2009   10:45 AM, 02/13/21 CJosue HectorPT DPT  Physical  [facility] [phone] [address] [address]Phone: [phone]  Fax:  [phone] Name: Breanna ALMARIOMRN: [id number] of Birth: [birth year]

## 2021-02-16 ENCOUNTER — Other Ambulatory Visit: Payer: Self-pay

## 2021-02-16 ENCOUNTER — Encounter (HOSPITAL_COMMUNITY): Payer: Self-pay | Admitting: Physical Therapy

## 2021-02-16 ENCOUNTER — Ambulatory Visit (HOSPITAL_COMMUNITY): Payer: Medicare HMO | Admitting: Physical Therapy

## 2021-02-16 DIAGNOSIS — M25612 Stiffness of left shoulder, not elsewhere classified: Secondary | ICD-10-CM

## 2021-02-16 DIAGNOSIS — R29898 Other symptoms and signs involving the musculoskeletal system: Secondary | ICD-10-CM | POA: Diagnosis not present

## 2021-02-16 DIAGNOSIS — M25512 Pain in left shoulder: Secondary | ICD-10-CM

## 2021-02-16 NOTE — Therapy (Signed)
[place], [place], [address] Phone: [phone]   Fax:  [phone]  Physical Therapy Treatment/Progress Note/Recert  Patient Details  Name: LAQUANDRA CARRILLO MRN: [id number] Date of Birth: April 15, 1940 Referring Provider (PT): Ophelia Charter MD   Encounter Date: 02/16/2021  Progress Note   Reporting Period 01/24/21 to 02/16/21   See note below for Objective Data and Assessment of Progress/Goals    PT End of Session - 02/16/21 1007     Visit Number 8    Number of Visits 20    Date for PT Re-Evaluation 03/30/21    Authorization Type Aetna Medicare HMO, [place] 2ndary    PT Start Time 1006    PT Stop Time 1044    PT Time Calculation (min) 38 min    Activity Tolerance Patient tolerated treatment well    Behavior During Therapy [facility] for tasks assessed/performed             Past Medical History:  Diagnosis Date   Arthritis    R hand    Breast cancer ([place])    Breast disorder    cancer   Cancer ([place])    breast CA- diagnosed with needle biopsy   Depression    GERD (gastroesophageal reflux disease)    rare use of tums    High cholesterol 10/17/2015   History of radiation therapy 03/27/17- 04/23/17   Left Breast, 2.67 Gy in 15 fractions for a total dose of 40.05 Gy. Boost, 2 Gy in 5 fractions for a total dose of 10 Gy.    Hypertension    Hypothyroidism    Pre-diabetes    Hgba1C- in the 6 's , per pt., she monitors her diet    Past Surgical History:  Procedure Laterality Date   BACK SURGERY  1973   BREAST LUMPECTOMY Left 02/2017   BREAST LUMPECTOMY WITH RADIOACTIVE SEED AND SENTINEL LYMPH NODE BIOPSY Left 02/27/2017   Procedure: LEFT BREAST LUMPECTOMY WITH RADIOACTIVE SEED AND LEFT AXILLARY SENTINEL LYMPH NODE BIOPSY;  Surgeon: Donnie Mesa, MD;  Location: [place];  Service: General;  Laterality: Left;   COLONOSCOPY     X 2   COLONOSCOPY N/A 02/22/2016   Procedure: COLONOSCOPY;  Surgeon: Rogene Houston, MD;  [facility];  Service: Endoscopy;  Laterality: N/A;  1:45   EYE SURGERY Bilateral    phlebpheroplasty   Left shoultder surgery     for bone spurs & frozen shoulder    REVERSE SHOULDER ARTHROPLASTY Left 09/14/2020   Procedure: REVERSE SHOULDER ARTHROPLASTY;  Surgeon: Hiram Gash, MD;  [facility];  Service: Orthopedics;  Laterality: Left;   Right bunionectomy     TONSILLECTOMY     TUBAL LIGATION      There were no vitals filed for this visit.   Subjective Assessment - 02/16/21 1007     Subjective Patient states her home exercises are going well and she has a question about one. Patient states 75% improvement with PT intervention.  She continues to have trouble with strength, lifting and mobility. She really wants to improve strength so she can get back to using her curling iron. Lifting arm is improving but still not doing right.    Pertinent History LT TSA    Limitations Lifting;House hold activities;Other (comment)    Patient Stated Goals Be back to 100%, reach overhead and dry hair    Currently in Pain? No/denies  [facility] PT Assessment - 02/16/21 0001       Assessment   Medical Diagnosis LT shoulder pain    Referring Provider (PT) Ophelia Charter MD    Prior Therapy Yes, OT for shoulder      Precautions   Precautions None      Restrictions   Weight Bearing Restrictions No      Prior Function   Level of Independence Independent      Cognition   Overall Cognitive Status Within Functional Limits for tasks assessed      Observation/Other Assessments   Focus on Therapeutic Outcomes (FOTO)  NA      Posture/Postural Control   Posture/Postural Control Postural limitations    Postural Limitations Rounded Shoulders;Forward head;Increased thoracic kyphosis      AROM   Right Shoulder Flexion 130 Degrees    Right Shoulder ABduction 135 Degrees    Right Shoulder Internal Rotation --   T8   Right Shoulder External Rotation --   T3    Left Shoulder Flexion 102 Degrees   with hike   Left Shoulder ABduction 90 Degrees   with hike   Left Shoulder External Rotation --   T1     Strength   Right Shoulder Flexion 5/5    Right Shoulder ABduction 5/5    Right Shoulder Internal Rotation 5/5    Right Shoulder External Rotation 5/5    Left Shoulder Flexion 4+/5   with shoulder hike   Left Shoulder ABduction 4/5   with shoulder hike   Left Shoulder Internal Rotation 5/5    Left Shoulder External Rotation 4/5                           [facility] Adult PT Treatment/Exercise - 02/16/21 0001       Shoulder Exercises: Seated   Other Seated Exercises UBE 2/2 FWD Back      Shoulder Exercises: Standing   Other Standing Exercises shoulder abduction and shoulder flexion with perpendicular resistance 2x 8 each red band                     PT Education - 02/16/21 1007     Education Details HEP, reassessment findings, POC    Person(s) Educated Patient    Methods Explanation;Demonstration    Comprehension Verbalized understanding;Returned demonstration              PT Short Term Goals - 02/16/21 1017       PT SHORT TERM GOAL #1   Title Patient will be independent with initial HEP and self-management strategies to improve functional outcomes    Time 2    Period Weeks    Status Achieved    Target Date 02/07/21               PT Long Term Goals - 02/16/21 1017       PT LONG TERM GOAL #1   Title Patient will be independent with Advanced HEP and self-management strategies to improve functional outcomes    Time 4    Period Weeks    Status On-going    Target Date 02/21/21      PT LONG TERM GOAL #2   Title Patient will report at least 65% overall improvement in subjective complaint to indicate improvement in ability to perform ADLs.    Time 4    Period Weeks    Status Achieved    Target Date 02/21/21  PT LONG TERM GOAL #3   Title Patient will have LT shoulder flexion AROM at  least 105 degrees for improved ability for dressing and ADLs    Time 4    Period Weeks    Status On-going    Target Date 02/21/21      PT LONG TERM GOAL #4   Title Patient will demonstrate 5/5 shoulder flexion strength without shoulder hike for improved ability to curl her hair.    Time 6    Period Weeks    Status New    Target Date 03/30/21                   Plan - 02/16/21 1007     Clinical Impression Statement Patient has met 1/1 short term goals and 1/3 long term goals with ability to complete HEP and improvement in symptoms/function. Remaining goals not met due to strength and ROM deficits. New goal added improving strength due to patient limitation with hair care. Continued shoulder strengthening today with rsistance bands. Added flexion/abd with perpendicular resistance for improved RC activation. Extending POC 2x /week for 6 weeks for continued deficit. Patient will continue to benefit from skilled physical therapy in order to reduce impairment and improve function.    Examination-Activity Limitations Reach Overhead;Carry;Lift;Dressing;Hygiene/Grooming    Examination-Participation Restrictions Cleaning;Driving;Yard Work;Meal Prep;Laundry    Stability/Clinical Decision Making Stable/Uncomplicated    Rehab Potential Good    PT Frequency 2x / week    PT Duration 6 weeks    PT Treatment/Interventions ADLs/Self Care Home Management;Biofeedback;Cryotherapy;Electrical Stimulation;Therapeutic exercise;Therapeutic activities;Ultrasound;Traction;Moist Heat;Iontophoresis 76m/ml Dexamethasone;DME Instruction;Patient/family education;Manual techniques;Compression bandaging;Orthotic Fit/Training;Energy conservation;Splinting;Taping;Vasopneumatic Device;Joint Manipulations;Spinal Manipulations;Dry needling;Passive range of motion;Scar mobilization;Visual/perceptual remediation/compensation;Balance training    PT Next Visit Plan Progress postural strength, thoracic and GHJ mobility as  tolerated. F/U about DN. Manual PROM and STM as needed    PT Home Exercise Plan Eval: reviewed previous OT HEP 1/26 band rows, OH flexion stretcth (supine), shoulder ER 1/[place] with scap ret in supine, horizontal abd, shoulder flexion with band in hands - all supine 2/2 shoulder ER iso    Consulted and Agree with Plan of Care Patient             Patient will benefit from skilled therapeutic intervention in order to improve the following deficits and impairments:  Pain, Improper body mechanics, Increased fascial restricitons, Impaired flexibility, Postural dysfunction, Decreased activity tolerance, Decreased range of motion, Decreased strength, Impaired UE functional use  Visit Diagnosis: Stiffness of left shoulder, not elsewhere classified  Acute pain of left shoulder  Other symptoms and signs involving the musculoskeletal system     Problem List Patient Active Problem List   Diagnosis Date Noted   Malignant neoplasm of upper-inner quadrant of left breast in female, estrogen receptor positive ([place] 03/05/2017   History of colonic polyps 10/31/2015   High cholesterol 10/17/2015   SHOULDER, ARTHRITIS, DEGEN./OSTEO 02/28/2009   CONTRACTURE OF SHOULDER JOINT 02/16/2009   SHOULDER PAIN 02/16/2009    10:44 AM, 02/16/21 AMearl LatinPT, DPT Physical Therap[facility] [address]Phone: [phone]  Fax:  [phone] Name: BMANIYA DONOVANMRN: [id number] of Birth: [birth year]

## 2021-02-16 NOTE — Progress Notes (Signed)
Patient Care Team: Asencion Noble, MD as PCP - General (Internal Medicine) Herminio Commons, MD (Inactive) as PCP - Cardiology (Cardiology) Nicholas Lose, MD as Consulting Physician (Hematology and Oncology) [name], MD as Attending Physician (Radiation Oncology) Donnie Mesa, MD as Consulting Physician (General Surgery) Gardenia Phlegm, NP as Nurse Practitioner (Hematology and Oncology)  DIAGNOSIS:    ICD-10-CM   1. Malignant neoplasm of upper-inner quadrant of left breast in female, estrogen receptor positive ([place])  C50.212    Z17.0       SUMMARY OF ONCOLOGIC HISTORY: Oncology History  Malignant neoplasm of upper-inner quadrant of left breast in female, estrogen receptor positive ([place])  02/27/2017 Surgery   Left lumpectomy 02/27/2017: IDC grade 1, 1.1 cm, ALH, margins negative, 0/2 lymph nodes negative, ER 90%, PR 10%, HER-2 negative ratio 1.26, Ki-67 1%, T1CN0 stage I a    03/27/2017 - 04/30/2017 Radiation Therapy   Adjuvant radiation therapy   04/30/2017 -  Anti-estrogen oral therapy   Letrozole 2.5 mg daily x5 years     CHIEF COMPLIANT: Follow-up of left breast cancer  INTERVAL HISTORY: Breanna Hill is a 81 y.o. with above-mentioned history of left breast cancer treated with lumpectomy, radiation, and who is currently on anti-estrogen therapy with letrozole. Mammogram on 11/02/2020 showed no evidence of malignancy bilaterally. She presents to the clinic today for follow-up.  She is tolerating letrozole fairly well but she fell and broke her shoulder and she underwent surgery and is going to physical therapy.  Range of motion is quite limited in the left shoulder.  Denies any lumps or nodules in the breast.  Currently receiving Prolia for osteoporosis.  ALLERGIES:  is allergic to penicillins.  MEDICATIONS:  Current Outpatient Medications  Medication Sig Dispense Refill   atorvastatin (LIPITOR) 20 MG tablet Take 20 mg by mouth in the morning.     Calcium  Carbonate-Vitamin D (CALCIUM 600+D PO) Take 1 tablet by mouth in the morning.     calcium elemental as carbonate (TUMS ULTRA 1000) 400 MG chewable tablet Chew 2 tablets by mouth 3 (three) times daily as needed for heartburn.     Cholecalciferol (VITAMIN D-3) 125 MCG (5000 UT) TABS Take 5,000 Units by mouth in the morning.     letrozole (FEMARA) 2.5 MG tablet TAKE 1 TABLET BY MOUTH EVERY DAY 90 tablet 3   levothyroxine (SYNTHROID) 88 MCG tablet Take 88 mcg by mouth daily before breakfast.     lisinopril (ZESTRIL) 10 MG tablet Take 10 mg by mouth in the morning.     tiZANidine (ZANAFLEX) 2 MG tablet Take 2 mg by mouth 2 (two) times daily as needed for muscle spasms.     Trospium Chloride 60 MG CP24 Take 1 capsule (60 mg total) by mouth daily. (Patient taking differently: Take 60 mg by mouth every evening.) 30 capsule 11   Vibegron (GEMTESA) 75 MG TABS Take 75 mg by mouth daily in the afternoon. Takes in the evening     No current facility-administered medications for this visit.    PHYSICAL EXAMINATION: ECOG PERFORMANCE STATUS: 1 - Symptomatic but completely ambulatory  Vitals:   02/17/21 1046  BP: 132/73  Pulse: 99  Resp: 18  Temp: (!) 97 F (36.1 C)  SpO2: 94%   Filed Weights   02/17/21 1046  Weight: 156 lb 12.8 oz (71.1 kg)    BREAST: No palpable masses or nodules in either right or left breasts. No palpable axillary supraclavicular or infraclavicular adenopathy no  breast tenderness or nipple discharge. (exam performed in the presence of a chaperone)  LABORATORY DATA:  I have reviewed the data as listed CMP Latest Ref Rng & Units 09/14/2020 02/06/2019 02/21/2017  Glucose 70 - 99 mg/dL 152(H) 148(H) 134(H)  BUN 8 - 23 mg/dL _0 Creatinine 0.44 - 1.00 mg/dL 0.88 0.77 0.87  Sodium 135 - 145 mmol/L 138 139 138  Potassium 3.5 - 5.1 mmol/L 3.8 3.7 4.0  Chloride 98 - 111 mmol/L 98 102 102  CO2 22 - 32 mmol/L _1 Calcium 8.9 - 10.3 mg/dL 11.5(H) 8.7(L) 9.1  Total Protein  6.0 - 8.3 g/dL - - -  Total Bilirubin 0.3 - 1.2 mg/dL - - -  Alkaline Phos 39 - 117 U/L - - -  AST 0 - 37 U/L - - -  ALT 0 - 35 U/L - - -    Lab Results  Component Value Date   WBC 9.6 09/14/2020   HGB 13.1 09/14/2020   HCT 39.1 09/14/2020   MCV 91.4 09/14/2020   PLT 238 09/14/2020   NEUTROABS 3.9 02/06/2019    ASSESSMENT & PLAN:  Malignant neoplasm of upper-inner quadrant of left breast in female, estrogen receptor positive ([place]) Left lumpectomy 02/27/2017: IDC grade 1, 1.1 cm, ALH, margins negative, 0/2 lymph nodes negative, ER 90%, PR 10%, HER-2 negative ratio 1.26, Ki-67 1%, T1CN0 stage I a   Adjuvant radiation therapy 03/27/2017-2017-05-06   Treatment plan: Adjuvant antiestrogen therapy with letrozole 2.5 mg daily x5 years started May 06, 2017   Letrozole toxicities: Mild hot flashes   Husband died 05/07/19. Shes grieving from his loss Major depression: We will refer her to psychiatry in [place]. Osteoporosis: On Prolia injections. Fracture of the shoulder: Receiving physical therapy with limitation of range of motion   Breast cancer surveillance: 1.  Mammogram 11/02/2020 Benign breast density category B 2.  Bone density will need to be performed.   Return to clinic in 1 year for follow-up  No orders of the defined types were placed in this encounter.  The patient has a good understanding of the overall plan. she agrees with it. she will call with any problems that may develop before the next visit here.  Total time spent: 20 mins including face to face time and time spent for planning, charting and coordination of care  Rulon Eisenmenger, MD, MPH 02/17/2021  I, Thana Ates, am acting as scribe for Dr. Nicholas Lose.  I have reviewed the above documentation for accuracy and completeness, and I agree with the above.

## 2021-02-17 ENCOUNTER — Inpatient Hospital Stay: Payer: Medicare HMO | Attending: Hematology and Oncology | Admitting: Hematology and Oncology

## 2021-02-17 ENCOUNTER — Other Ambulatory Visit: Payer: Self-pay

## 2021-02-17 DIAGNOSIS — S4290XA Fracture of unspecified shoulder girdle, part unspecified, initial encounter for closed fracture: Secondary | ICD-10-CM | POA: Insufficient documentation

## 2021-02-17 DIAGNOSIS — Z170 Estrogen receptor positive status [ER+]: Secondary | ICD-10-CM | POA: Diagnosis not present

## 2021-02-17 DIAGNOSIS — C50212 Malignant neoplasm of upper-inner quadrant of left female breast: Secondary | ICD-10-CM

## 2021-02-17 DIAGNOSIS — F329 Major depressive disorder, single episode, unspecified: Secondary | ICD-10-CM | POA: Diagnosis not present

## 2021-02-17 DIAGNOSIS — M810 Age-related osteoporosis without current pathological fracture: Secondary | ICD-10-CM | POA: Diagnosis not present

## 2021-02-17 DIAGNOSIS — R69 Illness, unspecified: Secondary | ICD-10-CM | POA: Diagnosis not present

## 2021-02-17 MED ORDER — ZOLPIDEM TARTRATE 5 MG PO TABS: 12.5000 mg | ORAL_TABLET | Freq: Every evening | ORAL | 0 refills | Status: AC | PRN

## 2021-02-17 NOTE — Assessment & Plan Note (Signed)
Left lumpectomy 02/27/2017: IDC grade 1, 1.1 cm, ALH, margins negative, 0/2 lymph nodes negative, ER 90%, PR 10%, HER-2 negative ratio 1.26, Ki-67 1%, T1CN0 stage I a  Adjuvant radiation therapy 03/27/2017-04/30/2017  Treatment plan: Adjuvant antiestrogen therapy with letrozole 2.5 mg daily x5 yearsstarted 04/30/2017  Letrozoletoxicities: Mild hot flashes  Husband died 2019-04-26. Shes grieving from his loss  Right knee arthritis: still present.  Breast cancer surveillance: 1.Mammogram11/02/2020 Benign breast density category B 2.Bone densitywill need to be performed.  Return to clinic in 1 year for follow-up

## 2021-02-17 NOTE — Progress Notes (Signed)
Referral placed per MD request.  I will call to follow up on referral status

## 2021-02-20 ENCOUNTER — Ambulatory Visit (HOSPITAL_COMMUNITY): Payer: Medicare HMO | Admitting: Physical Therapy

## 2021-02-20 ENCOUNTER — Encounter (HOSPITAL_COMMUNITY): Payer: Self-pay | Admitting: Physical Therapy

## 2021-02-20 ENCOUNTER — Other Ambulatory Visit: Payer: Self-pay

## 2021-02-20 DIAGNOSIS — M25512 Pain in left shoulder: Secondary | ICD-10-CM

## 2021-02-20 DIAGNOSIS — M25612 Stiffness of left shoulder, not elsewhere classified: Secondary | ICD-10-CM | POA: Diagnosis not present

## 2021-02-20 DIAGNOSIS — R29898 Other symptoms and signs involving the musculoskeletal system: Secondary | ICD-10-CM

## 2021-02-20 NOTE — Patient Instructions (Signed)
Access Code: [email or web address] URL: [email or web address] Date: 02/20/2021 Prepared by: Mitzi Hansen Tirsa Gail  Exercises Supine PNF D2 Flexion with Resistance - 1 x daily - 7 x weekly - 3 sets - 10 reps

## 2021-02-20 NOTE — Therapy (Signed)
[place], [place], [address] Phone: [phone]   Fax:  [phone]  Physical Therapy Treatment  Patient Details  Name: Breanna Hill MRN: [id number] Date of Birth: [birth year] Referring Provider (PT): Ophelia Charter MD   Encounter Date: 02/20/2021   PT End of Session - 02/20/21 0913     Visit Number 9    Number of Visits 20    Date for PT Re-Evaluation 03/30/21    Authorization Type [employer] Medicare HMO, [place] 2ndary    PT Start Time [phone]    PT Stop Time [phone]    PT Time Calculation (min) 38 min    Activity Tolerance Patient tolerated treatment well    Behavior During Therapy [facility] for tasks assessed/performed             Past Medical History:  Diagnosis Date   Arthritis    R hand    Breast cancer ([place])    Breast disorder    cancer   Cancer ([place])    breast CA- diagnosed with needle biopsy   Depression    GERD (gastroesophageal reflux disease)    rare use of tums    High cholesterol 10/17/2015   History of radiation therapy 03/27/17- 04/23/17   Left Breast, 2.67 Gy in 15 fractions for a total dose of 40.05 Gy. Boost, 2 Gy in 5 fractions for a total dose of 10 Gy.    Hypertension    Hypothyroidism    Pre-diabetes    Hgba1C- in the 6 's , per pt., she monitors her diet    Past Surgical History:  Procedure Laterality Date   BACK SURGERY  1973   BREAST LUMPECTOMY Left 02/2017   BREAST LUMPECTOMY WITH RADIOACTIVE SEED AND SENTINEL LYMPH NODE BIOPSY Left 02/27/2017   Procedure: LEFT BREAST LUMPECTOMY WITH RADIOACTIVE SEED AND LEFT AXILLARY SENTINEL LYMPH NODE BIOPSY;  Surgeon: Donnie Mesa, MD;  Location: [place];  Service: General;  Laterality: Left;   COLONOSCOPY     X 2   COLONOSCOPY N/A 02/22/2016   Procedure: COLONOSCOPY;  Surgeon: Rogene Houston, MD;  [facility];  Service: Endoscopy;  Laterality: N/A;  1:45   EYE SURGERY Bilateral    phlebpheroplasty   Left shoultder  surgery     for bone spurs & frozen shoulder    REVERSE SHOULDER ARTHROPLASTY Left 09/14/2020   Procedure: REVERSE SHOULDER ARTHROPLASTY;  Surgeon: Hiram Gash, MD;  [facility];  Service: Orthopedics;  Laterality: Left;   Right bunionectomy     TONSILLECTOMY     TUBAL LIGATION      There were no vitals filed for this visit.   Subjective Assessment - 02/20/21 0913     Subjective Patient states shoulder has been sore after cutting bushes for a few hours.    Pertinent History LT TSA    Limitations Lifting;House hold activities;Other (comment)    Patient Stated Goals Be back to 100%, reach overhead and dry hair    Currently in Pain? Yes    Pain Score 3     Pain Location Shoulder    Pain Orientation Right    Pain Descriptors / Indicators Sore    Pain Type Chronic pain                               [facility] Adult PT Treatment/Exercise - 02/20/21 0001  Shoulder Exercises: Supine   Other Supine Exercises shoulder flexion with band between hands 2x 15 red band flat table 1x15 20 degrees elevated, 1x15 30 degrees elevated; PNF D2 pattern red band 3x 10      Shoulder Exercises: Seated   Other Seated Exercises UBE 2/2 FWD Back      Shoulder Exercises: Standing   Other Standing Exercises shoulder abduction and shoulder flexion with perpendicular resistance 3x 8 each red band                     PT Education - 02/20/21 0913     Education Details HEP    Person(s) Educated Patient    Methods Explanation    Comprehension Verbalized understanding              PT Short Term Goals - 02/16/21 1017       PT SHORT TERM GOAL #1   Title Patient will be independent with initial HEP and self-management strategies to improve functional outcomes    Time 2    Period Weeks    Status Achieved    Target Date 02/07/21               PT Long Term Goals - 02/16/21 1017       PT LONG TERM GOAL #1   Title Patient will be independent with  Advanced HEP and self-management strategies to improve functional outcomes    Time 4    Period Weeks    Status On-going    Target Date 02/21/21      PT LONG TERM GOAL #2   Title Patient will report at least 65% overall improvement in subjective complaint to indicate improvement in ability to perform ADLs.    Time 4    Period Weeks    Status Achieved    Target Date 02/21/21      PT LONG TERM GOAL #3   Title Patient will have LT shoulder flexion AROM at least 105 degrees for improved ability for dressing and ADLs    Time 4    Period Weeks    Status On-going    Target Date 02/21/21      PT LONG TERM GOAL #4   Title Patient will demonstrate 5/5 shoulder flexion strength without shoulder hike for improved ability to curl her hair.    Time 6    Period Weeks    Status New    Target Date 03/30/21                   Plan - 02/20/21 0913     Clinical Impression Statement Began session on UBE for dynamic warm up. Patient demonstrating 108 degrees flexion at beginning of session. Patient given intermittent verbal and tactile cueing for posture and avoiding shoulder hike throughout session with fair carry over. Patient demonstrating improving AAROM in supine and tolerates increased elevation of plinth. Began PNF exercise in supine for improved tri-planar shoulder strength. Patient will continue to benefit from skilled physical therapy in order to reduce impairment and improve function.    Examination-Activity Limitations Reach Overhead;Carry;Lift;Dressing;Hygiene/Grooming    Examination-Participation Restrictions Cleaning;Driving;Yard Work;Meal Prep;Laundry    Stability/Clinical Decision Making Stable/Uncomplicated    Rehab Potential Good    PT Frequency 2x / week    PT Duration 6 weeks    PT Treatment/Interventions ADLs/Self Care Home Management;Biofeedback;Cryotherapy;Electrical Stimulation;Therapeutic exercise;Therapeutic activities;Ultrasound;Traction;Moist Heat;Iontophoresis  73m/ml Dexamethasone;DME Instruction;Patient/family education;Manual techniques;Compression bandaging;Orthotic Fit/Training;Energy conservation;Splinting;Taping;Vasopneumatic Device;Joint Manipulations;Spinal Manipulations;Dry needling;Passive range of motion;Scar mobilization;Visual/perceptual  remediation/compensation;Balance training    PT Next Visit Plan Progress postural strength, thoracic and GHJ mobility as tolerated. F/U about DN. Manual PROM and STM as needed    PT Home Exercise Plan Eval: reviewed previous OT HEP 1/26 band rows, OH flexion stretcth (supine), shoulder ER 1/31 [place] ER with scap ret in supine, horizontal abd, shoulder flexion with band in hands - all supine 2/2 shoulder ER iso    Consulted and Agree with Plan of Care Patient             Patient will benefit from skilled therapeutic intervention in order to improve the following deficits and impairments:  Pain, Improper body mechanics, Increased fascial restricitons, Impaired flexibility, Postural dysfunction, Decreased activity tolerance, Decreased range of motion, Decreased strength, Impaired UE functional use  Visit Diagnosis: Stiffness of left shoulder, not elsewhere classified  Acute pain of left shoulder  Other symptoms and signs involving the musculoskeletal system     Problem List Patient Active Problem List   Diagnosis Date Noted   Malignant neoplasm of upper-inner quadrant of left breast in female, estrogen receptor positive ([place]) 03/05/2017   History of colonic polyps 10/31/2015   High cholesterol 10/17/2015   SHOULDER, ARTHRITIS, DEGEN./OSTEO 02/28/2009   CONTRACTURE OF SHOULDER JOINT 02/16/2009   SHOULDER PAIN 02/16/2009    9:55 AM, 02/20/21 Mearl Latin PT, DPT Physical Therapist at [facility] [address] [place], [place], [address] Phone: [phone]   Fax:  [phone]  Name: TAMETHA BANNING MRN:  [id number] Date of Birth: 03-24-1940

## 2021-02-23 ENCOUNTER — Other Ambulatory Visit: Payer: Self-pay

## 2021-02-23 ENCOUNTER — Ambulatory Visit (HOSPITAL_COMMUNITY): Payer: Medicare HMO | Admitting: Physical Therapy

## 2021-02-23 ENCOUNTER — Encounter (HOSPITAL_COMMUNITY): Payer: Self-pay | Admitting: Physical Therapy

## 2021-02-23 DIAGNOSIS — R29898 Other symptoms and signs involving the musculoskeletal system: Secondary | ICD-10-CM

## 2021-02-23 DIAGNOSIS — M25612 Stiffness of left shoulder, not elsewhere classified: Secondary | ICD-10-CM | POA: Diagnosis not present

## 2021-02-23 DIAGNOSIS — M25512 Pain in left shoulder: Secondary | ICD-10-CM | POA: Diagnosis not present

## 2021-02-23 NOTE — Therapy (Signed)
[place], [place], [address] Phone: [phone]   Fax:  [phone]  Physical Therapy Treatment  Patient Details  Name: Breanna Hill MRN: [id number] Date of Birth: 02-16-1940 Referring Provider (PT): Ophelia Charter MD   Encounter Date: 02/23/2021   PT End of Session - 02/23/21 1525     Visit Number 10    Number of Visits 20    Date for PT Re-Evaluation 03/30/21    Authorization Type [employer] Medicare HMO, [place] [employer] 2ndary    Progress Note Due on Visit 18    PT Start Time 1523    PT Stop Time 1605    PT Time Calculation (min) 42 min    Activity Tolerance Patient tolerated treatment well    Behavior During Therapy [facility] for tasks assessed/performed             Past Medical History:  Diagnosis Date   Arthritis    R hand    Breast cancer ([place])    Breast disorder    cancer   Cancer ([place])    breast CA- diagnosed with needle biopsy   Depression    GERD (gastroesophageal reflux disease)    rare use of tums    High cholesterol 10/17/2015   History of radiation therapy 03/27/17- 04/23/17   Left Breast, 2.67 Gy in 15 fractions for a total dose of 40.05 Gy. Boost, 2 Gy in 5 fractions for a total dose of 10 Gy.    Hypertension    Hypothyroidism    Pre-diabetes    Hgba1C- in the 6 's , per pt., she monitors her diet    Past Surgical History:  Procedure Laterality Date   BACK SURGERY  1973   BREAST LUMPECTOMY Left 02/2017   BREAST LUMPECTOMY WITH RADIOACTIVE SEED AND SENTINEL LYMPH NODE BIOPSY Left 02/27/2017   Procedure: LEFT BREAST LUMPECTOMY WITH RADIOACTIVE SEED AND LEFT AXILLARY SENTINEL LYMPH NODE BIOPSY;  Surgeon: Donnie Mesa, MD;  Location: [place];  Service: General;  Laterality: Left;   COLONOSCOPY     X 2   COLONOSCOPY N/A 02/22/2016   Procedure: COLONOSCOPY;  Surgeon: Rogene Houston, MD;  [facility];  Service: Endoscopy;  Laterality: N/A;  1:45   EYE SURGERY Bilateral     phlebpheroplasty   Left shoultder surgery     for bone spurs & frozen shoulder    REVERSE SHOULDER ARTHROPLASTY Left 09/14/2020   Procedure: REVERSE SHOULDER ARTHROPLASTY;  Surgeon: Hiram Gash, MD;  [facility];  Service: Orthopedics;  Laterality: Left;   Right bunionectomy     TONSILLECTOMY     TUBAL LIGATION      There were no vitals filed for this visit.   Subjective Assessment - 02/23/21 1524     Subjective Getting better. She was able to hand up wash on wash line today. She is able to dry her hair with dryer now too. She feels about 70% improved since starting therapy.    Pertinent History LT TSA    Limitations Lifting;House hold activities;Other (comment)    Patient Stated Goals Be back to 100%, reach overhead and dry hair    Currently in Pain? No/denies                               [facility] Adult PT Treatment/Exercise - 02/23/21 0001       Shoulder Exercises: Supine  Other Supine Exercises shoulder ER iso 10 x 5"    Other Supine Exercises shoulder flexion with band between hands 2x 15 red band flat table, shoulder ER RTB 2 x 15      Manual Therapy   Manual Therapy Soft tissue mobilization;Passive ROM    Manual therapy comments completed separately from therapeutic exercises    Soft tissue mobilization STM to LT latissimus pre and post dry needling for trigger point ID and surface area prep    Passive ROM LT GHJ PROM flexion, abduction and external rotation    Other Manual Therapy LT shoulder rhythmic stabs in flexion 4 x20"              Trigger Point Dry Needling - 02/23/21 0001     Consent Given? Yes    Education Handout Provided Previously provided    Muscles Treated Upper Quadrant Latissimus dorsi    Dry Needling Comments 1 needle x 2 to LT lat patient in supine    Other Dry Needling good tolerance, conitnued muscle garding with needling Lat    Upper Trapezius Response Palpable increased muscle length                      PT Short Term Goals - 02/16/21 1017       PT SHORT TERM GOAL #1   Title Patient will be independent with initial HEP and self-management strategies to improve functional outcomes    Time 2    Period Weeks    Status Achieved    Target Date 02/07/21               PT Long Term Goals - 02/16/21 1017       PT LONG TERM GOAL #1   Title Patient will be independent with Advanced HEP and self-management strategies to improve functional outcomes    Time 4    Period Weeks    Status On-going    Target Date 02/21/21      PT LONG TERM GOAL #2   Title Patient will report at least 65% overall improvement in subjective complaint to indicate improvement in ability to perform ADLs.    Time 4    Period Weeks    Status Achieved    Target Date 02/21/21      PT LONG TERM GOAL #3   Title Patient will have LT shoulder flexion AROM at least 105 degrees for improved ability for dressing and ADLs    Time 4    Period Weeks    Status On-going    Target Date 02/21/21      PT LONG TERM GOAL #4   Title Patient will demonstrate 5/5 shoulder flexion strength without shoulder hike for improved ability to curl her hair.    Time 6    Period Weeks    Status New    Target Date 03/30/21                   Plan - 02/23/21 1614     Clinical Impression Statement Patient tolerated session well. Continues to be limited in shoulder AROM flexion but demos good (130 degrees) PROM. Ongoing trigger point noted on LT latissimus. Some improvement noted with dry needling, but still hyper sensitive. Added rhythmic stabilizations for shoulder stability. Patient showing improved form with shoulder ER isometrics, but declines shoulder stretching compliance. Reiterated importance of stretches and issued copy of HEP handout. Patient will continue to benefit from skilled therapy services to reduce  deficits and improve functional levels.    Examination-Activity Limitations Reach  Overhead;Carry;Lift;Dressing;Hygiene/Grooming    Examination-Participation Restrictions Cleaning;Driving;Yard Work;Meal Prep;Laundry    Stability/Clinical Decision Making Stable/Uncomplicated    Rehab Potential Good    PT Frequency 2x / week    PT Duration 6 weeks    PT Treatment/Interventions ADLs/Self Care Home Management;Biofeedback;Cryotherapy;Electrical Stimulation;Therapeutic exercise;Therapeutic activities;Ultrasound;Traction;Moist Heat;Iontophoresis 37m/ml Dexamethasone;DME Instruction;Patient/family education;Manual techniques;Compression bandaging;Orthotic Fit/Training;Energy conservation;Splinting;Taping;Vasopneumatic Device;Joint Manipulations;Spinal Manipulations;Dry needling;Passive range of motion;Scar mobilization;Visual/perceptual remediation/compensation;Balance training    PT Next Visit Plan Progress postural strength, thoracic and GHJ mobility as tolerated. F/U about DN. Manual PROM and STM as needed    PT Home Exercise Plan Eval: reviewed previous OT HEP 1/26 band rows, OH flexion stretcth (supine), shoulder ER 1/31 [place] with scap ret in supine, horizontal abd, shoulder flexion with band in hands - all supine 2/2 shoulder ER iso    Consulted and Agree with Plan of Care Patient             Patient will benefit from skilled therapeutic intervention in order to improve the following deficits and impairments:  Pain, Improper body mechanics, Increased fascial restricitons, Impaired flexibility, Postural dysfunction, Decreased activity tolerance, Decreased range of motion, Decreased strength, Impaired UE functional use  Visit Diagnosis: Stiffness of left shoulder, not elsewhere classified  Acute pain of left shoulder  Other symptoms and signs involving the musculoskeletal system     Problem List Patient Active Problem List   Diagnosis Date Noted   Malignant ne[place] of upper-inner quadrant of left breast in [place], [place]   History  of colonic polyps 10/31/2015   High cholesterol 10/17/2015   SHOULDER, ARTHRITIS, DEGEN./OSTEO 02/28/2009   CONTRACTURE OF SHOULDER JOINT 02/16/2009   SHOULDER PAIN 02/16/2009   4:19 PM, 02/23/21 CJosue HectorPT DPT  [facility] [phone] [address] [address]Phone: [phone]  Fax:  [phone] Name: Breanna HERTERMRN: [id number] of Birth: [birth year]

## 2021-02-23 NOTE — Patient Instructions (Signed)
Access Code: [email or web address] URL: [email or web address] Date: 02/23/2021 Prepared by: Josue Hector  Exercises Supine Shoulder Flexion with Dowel AAROM - Palms Up - 3 x daily - 7 x weekly - 1-2 sets - 10 reps - 10 second hold Supine Shoulder External Rotation with Dowel - 3 x daily - 7 x weekly - 1-2 sets - 10 reps - 10 second hold Shoulder Flexion Wall Slide with Towel - 3 x daily - 7 x weekly - 1-2 sets - 10 reps - 10 second hold

## 2021-02-27 ENCOUNTER — Encounter (HOSPITAL_COMMUNITY): Payer: Self-pay | Admitting: Physical Therapy

## 2021-02-27 ENCOUNTER — Ambulatory Visit (HOSPITAL_COMMUNITY): Payer: Medicare HMO | Admitting: Physical Therapy

## 2021-02-27 ENCOUNTER — Other Ambulatory Visit: Payer: Self-pay

## 2021-02-27 DIAGNOSIS — M25612 Stiffness of left shoulder, not elsewhere classified: Secondary | ICD-10-CM | POA: Diagnosis not present

## 2021-02-27 DIAGNOSIS — M25512 Pain in left shoulder: Secondary | ICD-10-CM

## 2021-02-27 DIAGNOSIS — R29898 Other symptoms and signs involving the musculoskeletal system: Secondary | ICD-10-CM | POA: Diagnosis not present

## 2021-02-27 NOTE — Therapy (Signed)
[place], [place], [address] Phone: [phone]   Fax:  [phone]  Physical Therapy Treatment  Patient Details  Name: Breanna Hill MRN: [id number] Date of Birth: [birth year] Referring Provider (PT): Ophelia Charter MD   Encounter Date: 02/27/2021   PT End of Session - 02/27/21 1404     Visit Number 11    Number of Visits 20    Date for PT Re-Evaluation 03/30/21    Authorization Type [employer] Medicare HMO, [place] health 2ndary    Progress Note Due on Visit 18    PT Start Time 1403    PT Stop Time 1441    PT Time Calculation (min) 38 min    Activity Tolerance Patient tolerated treatment well    Behavior During Therapy [facility] for tasks assessed/performed             Past Medical History:  Diagnosis Date   Arthritis    R hand    Breast cancer ([place])    Breast disorder    cancer   Cancer ([place])    breast CA- diagnosed with needle biopsy   Depression    GERD (gastroesophageal reflux disease)    rare use of tums    High cholesterol 10/17/2015   History of radiation therapy 03/27/17- 04/23/17   Left Breast, 2.67 Gy in 15 fractions for a total dose of 40.05 Gy. Boost, 2 Gy in 5 fractions for a total dose of 10 Gy.    Hypertension    Hypothyroidism    Pre-diabetes    Hgba1C- in the 6 's , per pt., she monitors her diet    Past Surgical History:  Procedure Laterality Date   BACK SURGERY  1973   BREAST LUMPECTOMY Left 02/2017   BREAST LUMPECTOMY WITH RADIOACTIVE SEED AND SENTINEL LYMPH NODE BIOPSY Left 02/27/2017   Procedure: LEFT BREAST LUMPECTOMY WITH RADIOACTIVE SEED AND LEFT AXILLARY SENTINEL LYMPH NODE BIOPSY;  Surgeon: Donnie Mesa, MD;  Location: [place];  Service: General;  Laterality: Left;   COLONOSCOPY     X 2   COLONOSCOPY N/A 02/22/2016   Procedure: COLONOSCOPY;  Surgeon: Rogene Houston, MD;  [facility];  Service: Endoscopy;  Laterality: N/A;  1:45   EYE SURGERY Bilateral     phlebpheroplasty   Left shoultder surgery     for bone spurs & frozen shoulder    REVERSE SHOULDER ARTHROPLASTY Left 09/14/2020   Procedure: REVERSE SHOULDER ARTHROPLASTY;  Surgeon: Hiram Gash, MD;  [facility];  Service: Orthopedics;  Laterality: Left;   Right bunionectomy     TONSILLECTOMY     TUBAL LIGATION      There were no vitals filed for this visit.   Subjective Assessment - 02/27/21 1404     Subjective Was able use curling iron. Has been doing exercises a lot over the weekend.    Currently in Pain? No/denies                [facility] PT Assessment - 02/27/21 0001       AROM   Left Shoulder Flexion 112 Degrees   with hike at beginning of session                          [place] Adult PT Treatment/Exercise - 02/27/21 0001       Shoulder Exercises: Supine   Other Supine Exercises shoulder flexion with band between hands  1x 15 red band 35 degrees, 1x 15  at 45 and 1x 5 very slow,red band 45 degrees; shoulder PNF D2 red band flat table 3x 10      Shoulder Exercises: Seated   Other Seated Exercises UBE 2/2 FWD Back      Shoulder Exercises: Standing   Other Standing Exercises ABCs with tennis ball at wall 1x; walol slides with red bad between hands 10 x                     PT Education - 02/27/21 1404     Education Details HEP    Person(s) Educated Patient    Methods Explanation;Demonstration    Comprehension Verbalized understanding;Returned demonstration              PT Short Term Goals - 02/16/21 1017       PT SHORT TERM GOAL #1   Title Patient will be independent with initial HEP and self-management strategies to improve functional outcomes    Time 2    Period Weeks    Status Achieved    Target Date 02/07/21               PT Long Term Goals - 02/16/21 1017       PT LONG TERM GOAL #1   Title Patient will be independent with Advanced HEP and self-management strategies to improve functional outcomes    Time  4    Period Weeks    Status On-going    Target Date 02/21/21      PT LONG TERM GOAL #2   Title Patient will report at least 65% overall improvement in subjective complaint to indicate improvement in ability to perform ADLs.    Time 4    Period Weeks    Status Achieved    Target Date 02/21/21      PT LONG TERM GOAL #3   Title Patient will have LT shoulder flexion AROM at least 105 degrees for improved ability for dressing and ADLs    Time 4    Period Weeks    Status On-going    Target Date 02/21/21      PT LONG TERM GOAL #4   Title Patient will demonstrate 5/5 shoulder flexion strength without shoulder hike for improved ability to curl her hair.    Time 6    Period Weeks    Status New    Target Date 03/30/21                   Plan - 02/27/21 1404     Clinical Impression Statement Continued with RC strengthening. Patient demonstrating improving flexion AROM to 112 with continued shoulder hike. She continues to demo [place]. Able to progress incline height today. Notes moderate fatigue at end of session. Frequent cueing for mechanics/ avoiding compensations with good/fair carry over. Patient will continue to benefit from skilled physical therapy in order to reduce impairment and improve function.    Examination-Activity Limitations Reach Overhead;Carry;Lift;Dressing;Hygiene/Grooming    Examination-Participation Restrictions Cleaning;Driving;Yard Work;Meal Prep;Laundry    Stability/Clinical Decision Making Stable/Uncomplicated    Rehab Potential Good    PT Frequency 2x / week    PT Duration 6 weeks    PT Treatment/Interventions ADLs/Self Care Home Management;Biofeedback;Cryotherapy;Electrical Stimulation;Therapeutic exercise;Therapeutic activities;Ultrasound;Traction;Moist Heat;Iontophoresis 39m/ml Dexamethasone;DME Instruction;Patient/family education;Manual techniques;Compression bandaging;Orthotic Fit/Training;Energy conservation;Splinting;Taping;Vasopneumatic  Device;Joint Manipulations;Spinal Manipulations;Dry needling;Passive range of motion;Scar mobilization;Visual/perceptual remediation/compensation;Balance training    PT Next Visit Plan Progress postural strength, thoracic and GHJ mobility as  tolerated. F/U about DN. Manual PROM and STM as needed    PT Home Exercise Plan Eval: reviewed previous OT HEP 1/26 band rows, OH flexion stretcth (supine), shoulder ER 1/31 [place] ER with scap ret in supine, horizontal abd, shoulder flexion with band in hands - all supine 2/2 shoulder ER iso    Consulted and Agree with Plan of Care Patient             Patient will benefit from skilled therapeutic intervention in order to improve the following deficits and impairments:  Pain, Improper body mechanics, Increased fascial restricitons, Impaired flexibility, Postural dysfunction, Decreased activity tolerance, Decreased range of motion, Decreased strength, Impaired UE functional use  Visit Diagnosis: Stiffness of left shoulder, not elsewhere classified  Acute pain of left shoulder  Other symptoms and signs involving the musculoskeletal system     Problem List Patient Active Problem List   Diagnosis Date Noted   Malignant neoplasm of upper-inner quadrant of left breast in female, estrogen receptor positive ([place]) 03/05/2017   History of colonic polyps 10/31/2015   High cholesterol 10/17/2015   SHOULDER, ARTHRITIS, DEGEN./OSTEO 02/28/2009   CONTRACTURE OF SHOULDER JOINT 02/16/2009   SHOULDER PAIN 02/16/2009    2:46 PM, 02/27/21 Mearl Latin PT, DPT Physical Therapist at [facility] [address] [place], [place], [address] Phone: [phone]   Fax:  [phone]  Name: LYSETTE LINDENBAUM MRN: [id number] Date of Birth: Sep 29, 1940

## 2021-03-06 DIAGNOSIS — M1711 Unilateral primary osteoarthritis, right knee: Secondary | ICD-10-CM | POA: Diagnosis not present

## 2021-03-06 DIAGNOSIS — M25561 Pain in right knee: Secondary | ICD-10-CM | POA: Diagnosis not present

## 2021-03-08 ENCOUNTER — Encounter (HOSPITAL_COMMUNITY): Payer: Medicare HMO | Admitting: Physical Therapy

## 2021-03-08 DIAGNOSIS — G4700 Insomnia, unspecified: Secondary | ICD-10-CM | POA: Diagnosis not present

## 2021-03-08 DIAGNOSIS — R69 Illness, unspecified: Secondary | ICD-10-CM | POA: Diagnosis not present

## 2021-03-09 ENCOUNTER — Encounter (HOSPITAL_COMMUNITY): Payer: Self-pay | Admitting: Physical Therapy

## 2021-03-09 ENCOUNTER — Ambulatory Visit (HOSPITAL_COMMUNITY): Payer: Medicare HMO | Attending: Orthopaedic Surgery | Admitting: Physical Therapy

## 2021-03-09 ENCOUNTER — Other Ambulatory Visit: Payer: Self-pay

## 2021-03-09 DIAGNOSIS — R29898 Other symptoms and signs involving the musculoskeletal system: Secondary | ICD-10-CM | POA: Diagnosis not present

## 2021-03-09 DIAGNOSIS — M25512 Pain in left shoulder: Secondary | ICD-10-CM

## 2021-03-09 DIAGNOSIS — M25612 Stiffness of left shoulder, not elsewhere classified: Secondary | ICD-10-CM | POA: Diagnosis not present

## 2021-03-09 NOTE — Therapy (Signed)
?OUTPATIENT PHYSICAL THERAPY TREATMENT NOTE ? ? ?Patient Name: Breanna Hill ?MRN: [id number] ?DOB:[birth year], 81 y.o., female ?Today's Date: 03/09/2021 ? ?PCP: Asencion Noble, MD ?REFERRING PROVIDER: Ophelia Charter MD ? ? PT End of Session - 03/09/21 0835   ? ? Visit Number 12   ? Number of Visits 20   ? Date for PT Re-Evaluation 03/30/21   ? Authorization Type [employer] HMO, [place] health 2ndary   ? Progress Note Due on Visit 18   ? PT Start Time [phone]   ? PT Stop Time 8101   ? PT Time Calculation (min) 38 min   ? Activity Tolerance Patient tolerated treatment well   ? Behavior During Therapy [facility] for tasks assessed/performed   ? ?  ?  ? ?  ? ? ?Past Medical History:  ?Diagnosis Date  ? Arthritis   ? R hand   ? Breast cancer ([place])   ? Breast disorder   ? cancer  ? Cancer [facility])   ? breast CA- diagnosed with needle biopsy  ? Depression   ? GERD (gastroesophageal reflux disease)   ? rare use of tums   ? High cholesterol 10/17/2015  ? History of radiation therapy 03/27/17- 04/23/17  ? Left Breast, 2.67 Gy in 15 fractions for a total dose of 40.05 Gy. Boost, 2 Gy in 5 fractions for a total dose of 10 Gy.   ? Hypertension   ? Hypothyroidism   ? Pre-diabetes   ? Hgba1C- in the 6 's , per pt., she monitors her diet  ? ?Past Surgical History:  ?Procedure Laterality Date  ? [place]  ? BREAST LUMPECTOMY Left 02/2017  ? BREAST LUMPECTOMY WITH RADIOACTIVE SEED AND SENTINEL LYMPH NODE BIOPSY Left 02/27/2017  ? Procedure: LEFT BREAST LUMPECTOMY WITH RADIOACTIVE SEED AND LEFT AXILLARY SENTINEL LYMPH NODE BIOPSY;  Surgeon: Donnie Mesa, MD;  Location: [facility];  Service: General;  Laterality: Left;  ? COLONOSCOPY    ? X 2  ? COLONOSCOPY N/A 02/22/2016  ? Procedure: COLONOSCOPY;  Surgeon: Rogene Houston, MD;  [facility];  Service: Endoscopy;  Laterality: N/A;  1:45  ? EYE SURGERY Bilateral   ? phlebpheroplasty  ? Left shoultder surgery    ? for bone spurs & frozen shoulder   ? REVERSE SHOULDER ARTHROPLASTY Left  09/14/2020  ? Procedure: REVERSE SHOULDER ARTHROPLASTY;  Surgeon: Hiram Gash, MD;  [facility];  Service: Orthopedics;  Laterality: Left;  ? Right bunionectomy    ? TONSILLECTOMY    ? TUBAL LIGATION    ? ?Patient Active Problem List  ? Diagnosis Date Noted  ? Malignant neoplasm of upper-inner quadrant of left breast in female, estrogen receptor positive ([place]) 03/05/2017  ? History of colonic polyps 10/31/2015  ? High cholesterol 10/17/2015  ? SHOULDER, ARTHRITIS, DEGEN./OSTEO 02/28/2009  ? CONTRACTURE OF SHOULDER JOINT 02/16/2009  ? SHOULDER PAIN 02/16/2009  ? ? ?REFERRING DIAG: LT shoulder pain ? ?THERAPY DIAG:  ?Stiffness of left shoulder, not elsewhere classified ? ?Acute pain of left shoulder ? ?Other symptoms and signs involving the musculoskeletal system ? ?PERTINENT HISTORY: LT TSA ? ?PRECAUTIONS: none ? ?SUBJECTIVE: Patient states she hurt her forearm. Shoulder is doing pretty good but overdid it one day.  ? ?PAIN:  ?Are you having pain? Yes ?NPRS scale: 1/10 ?Pain location: shoulder ?Pain orientation: Left  ?PAIN TYPE: tight ?Pain description: intermittent  ? ?Objective: ? ?UPPER EXTREMITY AROM/PROM: ? ?AROM Right ?03/09/2021 Left ?03/09/2021  ?Shoulder flexion  102 degrees with shoulder hike ?111 degrees end of session with shoulder hike  ?Shoulder extension    ?Shoulder abduction    ?Shoulder adduction    ?Shoulder internal rotation    ?Shoulder external rotation    ?Elbow flexion    ?Elbow extension    ?Wrist flexion    ?Wrist extension    ?Wrist ulnar deviation    ?Wrist radial deviation    ?Wrist pronation    ?Wrist supination    ?(Blank rows = not tested) ? ?UPPER EXTREMITY MMT: ? ?MMT Right ?03/09/2021 Left ?03/09/2021  ?Shoulder flexion 4+/5 4+/5  ?Shoulder extension    ?Shoulder abduction 4/5 4+/5  ?Shoulder adduction    ?Shoulder internal rotation    ?Shoulder external rotation    ?Middle trapezius    ?Lower trapezius    ?Elbow flexion    ?Elbow extension    ?Wrist flexion    ?Wrist extension     ?Wrist ulnar deviation    ?Wrist radial deviation    ?Wrist pronation    ?Wrist supination    ?Grip strength (lbs)    ?(Blank rows = not tested)  ? ? ?TODAY'S TREATMENT:  ?03/09/21 ?UBE forward/retro 2 minutes each ?Shoulder flexion with band between hands 2x 15 red band on 40 degree incline ?Shoulder PNF D2 red band supine on 40 degree incline 3x 10 ?Shoulder ABC at wall 3RT ?Shoulder horizontal abduction 2x 10 red band ? ? ? ? ? ?PATIENT EDUCATION: ?Education details: HEP, POC ?Person educated: Patient ?Education method: Explanation and Demonstration ?Education comprehension: verbalized understanding and returned demonstration ? ? ?HOME EXERCISE PROGRAM: ?Eval: reviewed previous OT HEP 1/26 band rows, OH flexion stretcth (supine), shoulder ER 1/31 [place] ER with scap ret in supine, horizontal abd, shoulder flexion with band in hands - all supine 2/2 shoulder ER iso ? ? PT Short Term Goals -   ? ?  ? PT SHORT TERM GOAL #1  ? Title Patient will be independent with initial HEP and self-management strategies to improve functional outcomes   ? Time 2   ? Period Weeks   ? Status Achieved   ? Target Date 02/07/21   ? ?  ?  ? ?  ? ? ? PT Long Term Goals   ? ?  ? PT LONG TERM GOAL #1  ? Title Patient will be independent with Advanced HEP and self-management strategies to improve functional outcomes   ? Time 4   ? Period Weeks   ? Status On-going   ? Target Date 02/21/21   ?  ? PT LONG TERM GOAL #2  ? Title Patient will report at least 65% overall improvement in subjective complaint to indicate improvement in ability to perform ADLs.   ? Time 4   ? Period Weeks   ? Status Achieved   ? Target Date 02/21/21   ?  ? PT LONG TERM GOAL #3  ? Title Patient will have LT shoulder flexion AROM at least 105 degrees for improved ability for dressing and ADLs   ? Time 4   ? Period Weeks   ? Status On-going   ? Target Date 02/21/21   ?  ? PT LONG TERM GOAL #4  ? Title Patient will demonstrate 5/5 shoulder flexion strength without shoulder  hike for improved ability to curl her hair.   ? Time 6   ? Period Weeks   ? Status  On-going  ? Target Date 03/30/21   ? ?  ?  ? ?  ? ? ?  Plan -  ? ? Clinical Impression Statement Patient demonstrating good strength in limited range and continues to lack end range motion. Continued with shoulder strengthening which is tolerated well. Patient able to complete increased reps of previously completed exercises and with increased ankle of AAROM flexion. She requires intermittent cueing for slow, controlled exercises. Patient demonstrating improving ROM at end of session consistant with prior sessions.  Patient will continue to benefit from skilled physical therapy in order to improve function and reduce impairment.   ? Examination-Activity Limitations Reach Overhead;Carry;Lift;Dressing;Hygiene/Grooming   ? Examination-Participation Restrictions Cleaning;Driving;Yard Work;Meal Prep;Laundry   ? Stability/Clinical Decision Making Stable/Uncomplicated   ? Rehab Potential Good   ? PT Frequency 2x / week   ? PT Duration 6 weeks   ? PT Treatment/Interventions ADLs/Self Care Home Management;Biofeedback;Cryotherapy;Electrical Stimulation;Therapeutic exercise;Therapeutic activities;Ultrasound;Traction;Moist Heat;Iontophoresis 78m/ml Dexamethasone;DME Instruction;Patient/family education;Manual techniques;Compression bandaging;Orthotic Fit/Training;Energy conservation;Splinting;Taping;Vasopneumatic Device;Joint Manipulations;Spinal Manipulations;Dry needling;Passive range of motion;Scar mobilization;Visual/perceptual remediation/compensation;Balance training   ? PT Next Visit Plan Progress postural strength, thoracic and GHJ mobility as tolerated. F/U about DN. Manual PROM and STM as needed   ? PT Home Exercise Plan Eval: reviewed previous OT HEP 1/26 band rows, OH flexion stretcth (supine), shoulder ER 1/31 [place] ER with scap ret in supine, horizontal abd, shoulder flexion with band in hands - all supine 2/2 shoulder ER iso   ?  Consulted and Agree with Plan of Care Patient   ? ?  ?  ? ?  ? ? ? ? ?AMearl Latin PT ?03/09/2021, 9:17 AM ? ?   ?

## 2021-03-13 ENCOUNTER — Ambulatory Visit (HOSPITAL_COMMUNITY): Payer: Medicare HMO | Admitting: Physical Therapy

## 2021-03-13 ENCOUNTER — Telehealth: Payer: Self-pay | Admitting: Urology

## 2021-03-13 ENCOUNTER — Other Ambulatory Visit: Payer: Self-pay

## 2021-03-13 ENCOUNTER — Encounter (HOSPITAL_COMMUNITY): Payer: Self-pay | Admitting: Physical Therapy

## 2021-03-13 DIAGNOSIS — M25612 Stiffness of left shoulder, not elsewhere classified: Secondary | ICD-10-CM | POA: Diagnosis not present

## 2021-03-13 DIAGNOSIS — M25512 Pain in left shoulder: Secondary | ICD-10-CM

## 2021-03-13 DIAGNOSIS — R29898 Other symptoms and signs involving the musculoskeletal system: Secondary | ICD-10-CM | POA: Diagnosis not present

## 2021-03-13 NOTE — Therapy (Signed)
OUTPATIENT PHYSICAL THERAPY TREATMENT NOTE   Patient Name: Breanna Hill MRN: [id number] DOB:[birth year], 81 y.o., female Today's Date: 03/13/2021  PCP: Asencion Noble, MD REFERRING PROVIDER: Ophelia Charter MD   PT End of Session - 03/13/21 1448     Visit Number 13    Number of Visits 20    Date for PT Re-Evaluation 03/30/21    Authorization Type [employer] HMO, [place] [employer] 2ndary    Progress Note Due on Visit 18    PT Start Time 1447    PT Stop Time 1525    PT Time Calculation (min) 38 min    Activity Tolerance Patient tolerated treatment well    Behavior During Therapy WFL for tasks assessed/performed             Past Medical History:  Diagnosis Date   Arthritis    R hand    Breast cancer ([place])    Breast disorder    cancer   Cancer ([place])    breast CA- diagnosed with needle biopsy   Depression    GERD (gastroesophageal reflux disease)    rare use of tums    High cholesterol 10/17/2015   History of radiation therapy 03/27/17- 04/23/17   Left Breast, 2.67 Gy in 15 fractions for a total dose of 40.05 Gy. Boost, 2 Gy in 5 fractions for a total dose of 10 Gy.    Hypertension    Hypothyroidism    Pre-diabetes    Hgba1C- in the 6 's , per pt., she monitors her diet   Past Surgical History:  Procedure Laterality Date   BACK SURGERY  1973   BREAST LUMPECTOMY Left 02/2017   BREAST LUMPECTOMY WITH RADIOACTIVE SEED AND SENTINEL LYMPH NODE BIOPSY Left 02/27/2017   Procedure: LEFT BREAST LUMPECTOMY WITH RADIOACTIVE SEED AND LEFT AXILLARY SENTINEL LYMPH NODE BIOPSY;  Surgeon: Donnie Mesa, MD;  Location: [facility];  Service: General;  Laterality: Left;   COLONOSCOPY     X 2   COLONOSCOPY N/A 02/22/2016   Procedure: COLONOSCOPY;  Surgeon: Rogene Houston, MD;  [facility];  Service: Endoscopy;  Laterality: N/A;  1:45   EYE SURGERY Bilateral    phlebpheroplasty   Left shoultder surgery     for bone spurs & frozen shoulder    REVERSE SHOULDER ARTHROPLASTY Left  09/14/2020   Procedure: REVERSE SHOULDER ARTHROPLASTY;  Surgeon: Hiram Gash, MD;  [facility];  Service: Orthopedics;  Laterality: Left;   Right bunionectomy     TONSILLECTOMY     TUBAL LIGATION     Patient Active Problem List   Diagnosis Date Noted   Malignant neoplasm of upper-inner quadrant of left breast in female, estrogen receptor positive ([place]) 03/05/2017   History of colonic polyps 10/31/2015   High cholesterol 10/17/2015   SHOULDER, ARTHRITIS, DEGEN./OSTEO 02/28/2009   CONTRACTURE OF SHOULDER JOINT 02/16/2009   SHOULDER PAIN 02/16/2009    REFERRING DIAG: LT shoulder pain  THERAPY DIAG:  Stiffness of left shoulder, not elsewhere classified  Acute pain of left shoulder  Other symptoms and signs involving the musculoskeletal system  PERTINENT HISTORY: LT TSA  PRECAUTIONS: none  SUBJECTIVE: Shoulder is doing alright. Lifting stuff is pretty good and reaching in cabinets is getting better.    PAIN:  Are you having pain? no NPRS scale: 0/10 Pain location: shoulder Pain orientation: Left  PAIN TYPE: tight Pain description: intermittent   Objective: UPPER EXTREMITY AROM/PROM:   AROM Right 03/09/2021 Left 03/09/2021 Right  03/13/2021 Left 03/13/2021  Shoulder flexion   102 degrees with shoulder hike 111 degrees end of session with shoulder hike  110 degrees with shoulder hike  Shoulder extension        Shoulder abduction        Shoulder adduction        Shoulder internal rotation        Shoulder external rotation        Elbow flexion        Elbow extension        Wrist flexion        Wrist extension        Wrist ulnar deviation        Wrist radial deviation        Wrist pronation        Wrist supination        (Blank rows = not tested)   UPPER EXTREMITY MMT:   MMT Right 03/09/2021 Left 03/09/2021  Shoulder flexion 4+/5 4+/5  Shoulder extension      Shoulder abduction 4/5 4+/5  Shoulder adduction      Shoulder internal rotation      Shoulder  external rotation      Middle trapezius      Lower trapezius      Elbow flexion      Elbow extension      Wrist flexion      Wrist extension      Wrist ulnar deviation      Wrist radial deviation      Wrist pronation      Wrist supination      Grip strength (lbs)      (Blank rows = not tested)      TODAY'S TREATMENT:  03/13/21 UBE forward/retro 2 minutes each [employer] from cabinet shoulder at 90 degrees 4# 2x 10  Shoulder flexion and abduction with perpendicular resistance 2x 10 each red band Shoulder horizontal abduction 3x 10 red band   03/09/21 UBE forward/retro 2 minutes each Shoulder flexion with band between hands 2x 15 red band on 40 degree incline Shoulder PNF D2 red band supine on 40 degree incline 3x 10 Shoulder ABC at wall 3RT Shoulder horizontal abduction 2x 10 red band      PATIENT EDUCATION: Education details: HEP, POC Person educated: Patient Education method: Customer service manager Education comprehension: verbalized understanding and returned demonstration   HOME EXERCISE PROGRAM: Eval: reviewed previous OT HEP 1/26 band rows, OH flexion stretcth (supine), shoulder ER 1/31 [place] ER with scap ret in supine, horizontal abd, shoulder flexion with band in hands - all supine 2/2 shoulder ER iso   PT Short Term Goals       PT SHORT TERM GOAL #1   Title Patient will be independent with initial HEP and self-management strategies to improve functional outcomes    Time 2    Period Weeks    Status Achieved    Target Date 02/07/21              PT Long Term Goals       PT LONG TERM GOAL #1   Title Patient will be independent with Advanced HEP and self-management strategies to improve functional outcomes    Time 4    Period Weeks    Status On-going    Target Date 02/21/21      PT LONG TERM GOAL #2   Title Patient will report at least 65% overall improvement in subjective complaint to indicate improvement in  ability to perform ADLs.     Time 4    Period Weeks    Status Achieved    Target Date 02/21/21      PT LONG TERM GOAL #3   Title Patient will have LT shoulder flexion AROM at least 105 degrees for improved ability for dressing and ADLs    Time 4    Period Weeks    Status On-going    Target Date 02/21/21      PT LONG TERM GOAL #4   Title Patient will demonstrate 5/5 shoulder flexion strength without shoulder hike for improved ability to curl her hair.    Time 6    Period Weeks    Status  On-going   Target Date 03/30/21              Plan -    Clinical Impression Statement Patient demonstrating 110 degrees flexion at beginning of session with shoulder hike secondary to weakness. Trialed eccentric strengthening with R arm putting weight on shelf and L lowering. Patient performs initial reps well with increased shoulder hike with fatigue. Continued strengthening with emphasis on RC and periscap strength. Educated on proper mechanics when completing stretches at home.  Patient will continue to benefit from skilled physical therapy in order to reduce impairment and improve function.   Examination-Activity Limitations Reach Overhead;Carry;Lift;Dressing;Hygiene/Grooming    Examination-Participation Restrictions Cleaning;Driving;Yard Work;Meal Prep;Laundry    Stability/Clinical Decision Making Stable/Uncomplicated    Rehab Potential Good    PT Frequency 2x / week    PT Duration 6 weeks    PT Treatment/Interventions ADLs/Self Care Home Management;Biofeedback;Cryotherapy;Electrical Stimulation;Therapeutic exercise;Therapeutic activities;Ultrasound;Traction;Moist Heat;Iontophoresis 53m/ml Dexamethasone;DME Instruction;Patient/family education;Manual techniques;Compression bandaging;Orthotic Fit/Training;Energy conservation;Splinting;Taping;Vasopneumatic Device;Joint Manipulations;Spinal Manipulations;Dry needling;Passive range of motion;Scar mobilization;Visual/perceptual remediation/compensation;Balance training    PT  Next Visit Plan Progress postural strength, thoracic and GHJ mobility as tolerated. F/U about DN. Manual PROM and STM as needed    PT Home Exercise Plan Eval: reviewed previous OT HEP 1/26 band rows, OH flexion stretcth (supine), shoulder ER 1/31 [place] with scap ret in supine, horizontal abd, shoulder flexion with band in hands - all supine 2/2 shoulder ER iso    Consulted and Agree with Plan of Care Patient              3:28 PM, 03/13/21 AMearl LatinPT, DPT Physical Therapist at [facility]

## 2021-03-13 NOTE — Telephone Encounter (Signed)
Please advise if another medication is an option.  ?

## 2021-03-13 NOTE — Telephone Encounter (Signed)
Patient called the office today to report that Trospium chloride has increased in cost to over $300.  ? ?Patient would like to know if Dr. Diona Fanti wants her to continue to take it or if there is an alternate option. ?

## 2021-03-14 ENCOUNTER — Other Ambulatory Visit: Payer: Self-pay | Admitting: Urology

## 2021-03-14 DIAGNOSIS — R350 Frequency of micturition: Secondary | ICD-10-CM

## 2021-03-14 MED ORDER — TROSPIUM CHLORIDE 20 MG PO TABS: 20.0000 mg | ORAL_TABLET | Freq: Two times a day (BID) | ORAL | 11 refills | Status: DC

## 2021-03-14 NOTE — Telephone Encounter (Signed)
Patient would like to do BID prescription  ?

## 2021-03-15 ENCOUNTER — Other Ambulatory Visit: Payer: Self-pay

## 2021-03-15 ENCOUNTER — Encounter (HOSPITAL_COMMUNITY): Payer: Self-pay | Admitting: Physical Therapy

## 2021-03-15 ENCOUNTER — Ambulatory Visit (HOSPITAL_COMMUNITY): Payer: Medicare HMO | Admitting: Physical Therapy

## 2021-03-15 DIAGNOSIS — M25512 Pain in left shoulder: Secondary | ICD-10-CM | POA: Diagnosis not present

## 2021-03-15 DIAGNOSIS — R29898 Other symptoms and signs involving the musculoskeletal system: Secondary | ICD-10-CM | POA: Diagnosis not present

## 2021-03-15 DIAGNOSIS — M25612 Stiffness of left shoulder, not elsewhere classified: Secondary | ICD-10-CM | POA: Diagnosis not present

## 2021-03-15 NOTE — Therapy (Signed)
?OUTPATIENT PHYSICAL THERAPY TREATMENT NOTE/Discharge Summary ? ? ?Patient Name: Breanna Hill ?MRN: [id number] ?DOB:[birth year], 81 y.o., female ?Today's Date: 03/15/2021 ? ?PCP: Asencion Noble, MD ?REFERRING PROVIDER: Ophelia Charter MD ? ?PHYSICAL THERAPY DISCHARGE SUMMARY ? ?Visits from Start of Care: 14 ? ?Current functional level related to goals / functional outcomes: ?See below ?  ?Remaining deficits: ?See below ?  ?Education / Equipment: ?See below  ? ?Patient agrees to discharge. Patient goals were partially met. Patient is being discharged due to meeting the stated rehab goals. ? ? ? PT End of Session - 03/15/21 1002   ? ? Visit Number 14   ? Number of Visits 20   ? Date for PT Re-Evaluation 03/30/21   ? Authorization Type [employer] HMO, [facility] [employer] 2ndary   ? Progress Note Due on Visit 18   ? PT Start Time 1002   ? PT Stop Time 1035   ? PT Time Calculation (min) 33 min   ? Activity Tolerance Patient tolerated treatment well   ? Behavior During Therapy [facility] for tasks assessed/performed   ? ?  ?  ? ?  ? ? ?Past Medical History:  ?Diagnosis Date  ? Arthritis   ? R hand   ? Breast cancer ([place])   ? Breast disorder   ? cancer  ? Cancer [facility])   ? breast CA- diagnosed with needle biopsy  ? Depression   ? GERD (gastroesophageal reflux disease)   ? rare use of tums   ? High cholesterol 10/17/2015  ? History of radiation therapy 03/27/17- 04/23/17  ? Left Breast, 2.67 Gy in 15 fractions for a total dose of 40.05 Gy. Boost, 2 Gy in 5 fractions for a total dose of 10 Gy.   ? Hypertension   ? Hypothyroidism   ? Pre-diabetes   ? Hgba1C- in the 6 's , per pt., she monitors her diet  ? ?Past Surgical History:  ?Procedure Laterality Date  ? [place]  ? BREAST LUMPECTOMY Left 02/2017  ? BREAST LUMPECTOMY WITH RADIOACTIVE SEED AND SENTINEL LYMPH NODE BIOPSY Left 02/27/2017  ? Procedure: LEFT BREAST LUMPECTOMY WITH RADIOACTIVE SEED AND LEFT AXILLARY SENTINEL LYMPH NODE BIOPSY;  Surgeon: Donnie Mesa, MD;  Location:  [place];  Service: General;  Laterality: Left;  ? COLONOSCOPY    ? X 2  ? COLONOSCOPY N/A 02/22/2016  ? Procedure: COLONOSCOPY;  Surgeon: Rogene Houston, MD;  [facility];  Service: Endoscopy;  Laterality: N/A;  1:45  ? EYE SURGERY Bilateral   ? phlebpheroplasty  ? Left shoultder surgery    ? for bone spurs & frozen shoulder   ? REVERSE SHOULDER ARTHROPLASTY Left 09/14/2020  ? Procedure: REVERSE SHOULDER ARTHROPLASTY;  Surgeon: Hiram Gash, MD;  [facility];  Service: Orthopedics;  Laterality: Left;  ? Right bunionectomy    ? TONSILLECTOMY    ? TUBAL LIGATION    ? ?Patient Active Problem List  ? Diagnosis Date Noted  ? Malignant neoplasm of upper-inner quadrant of left breast in female, estrogen receptor positive ([place]) 03/05/2017  ? History of colonic polyps 10/31/2015  ? High cholesterol 10/17/2015  ? SHOULDER, ARTHRITIS, DEGEN./OSTEO 02/28/2009  ? CONTRACTURE OF SHOULDER JOINT 02/16/2009  ? SHOULDER PAIN 02/16/2009  ? ? ?REFERRING DIAG: LT shoulder pain ? ?THERAPY DIAG:  ?Stiffness of left shoulder, not elsewhere classified ? ?Acute pain of left shoulder ? ?Other symptoms and signs involving the musculoskeletal system ? ?PERTINENT HISTORY: LT TSA ? ?  PRECAUTIONS: none ? ?SUBJECTIVE: Shoulder has been doing pretty good. Her HEP is going well. Doesn't feel that she is having much trouble at this point. She does have difficulty with putting things up onto shelves. Patient states 96% improvement with PT intervention with remaining deficits with reaching behind back. Feels that strength could be better but thinks it will get better. Arm got tired with leaf blowing.     ? ?PAIN:  ?Are you having pain? no ?NPRS scale: 0/10 ?Pain location: shoulder ?Pain orientation: Left  ?PAIN TYPE: tight ?Pain description: intermittent  ? ?Objective: ?UPPER EXTREMITY AROM/PROM: ?  ?AROM Right ?03/09/2021 Left ?03/09/2021 Right ?03/13/2021 Left ?03/13/2021 Right ?03/15/2021 Left ?03/15/2021  ?Shoulder flexion   102 degrees with  shoulder hike ?111 degrees end of session with shoulder hike  110 degrees with shoulder hike  112 degrees with shoulder hike  ?Shoulder extension          ?Shoulder abduction        92 degrees with shoulder hike   ?Shoulder adduction          ?Shoulder internal rotation          ?Shoulder external rotation          ?Elbow flexion          ?Elbow extension          ?Wrist flexion          ?Wrist extension          ?Wrist ulnar deviation          ?Wrist radial deviation          ?Wrist pronation          ?Wrist supination          ?(Blank rows = not tested) ?  ?UPPER EXTREMITY MMT: ?  ?MMT Right ?03/09/2021 Left ?03/09/2021 Right ?03/15/2021 Left ?03/15/2021  ?Shoulder flexion 4+/5 4+/5 5/5 4+/5 in limited range  ?Shoulder extension        ?Shoulder abduction 4/5 4+/5 5/5 4+/5 in limited range  ?Shoulder adduction        ?Shoulder internal rotation     5/5 5/5  ?Shoulder external rotation     5/5 4+/5 in limited range  ?Middle trapezius        ?Lower trapezius        ?Elbow flexion        ?Elbow extension        ?Wrist flexion        ?Wrist extension        ?Wrist ulnar deviation        ?Wrist radial deviation        ?Wrist pronation        ?Wrist supination        ?Grip strength (lbs)        ?(Blank rows = not tested)  ? ?  ? ? ?TODAY'S TREATMENT:  ?03/13/21 ?UBE forward/retro 2 minutes each ?Shoulder flexion with band between hands GTB 2x 10  ?Review of HEP mechanics ?[facility] ER with red band L UE 2x 10 ? ? ?03/13/21 ?UBE forward/retro 2 minutes each ?[employer] from cabinet shoulder at 90 degrees 4# 2x 10  ?Shoulder flexion and abduction with perpendicular resistance 2x 10 each red band ?Shoulder horizontal abduction 3x 10 red band ? ? ?03/09/21 ?UBE forward/retro 2 minutes each ?Shoulder flexion with band between hands 2x 15 red band on 40 degree incline ?Shoulder PNF D2 red band supine on 40 degree incline 3x  10 ?Shoulder ABC at wall 3RT ?Shoulder horizontal abduction 2x 10 red band ? ? ? ? ? ?PATIENT EDUCATION: ?Education  details: HEP, Reassessment findings, returning to PT if needed ?Person educated: Patient ?Education method: Explanation and Demonstration ?Education comprehension: verbalized understanding and returned demonstration ? ? ?HOME EXERCISE PROGRAM: ?Eval: reviewed previous OT HEP 1/26 band rows, OH flexion stretcth (supine), shoulder ER 1/31 [place] ER with scap ret in supine, horizontal abd, shoulder flexion with band in hands - all supine 2/2 shoulder ER iso ? ? PT Short Term Goals   ? ?  ? PT SHORT TERM GOAL #1  ? Title Patient will be independent with initial HEP and self-management strategies to improve functional outcomes   ? Time 2   ? Period Weeks   ? Status Achieved   ? Target Date 02/07/21   ? ?  ?  ? ?  ? ? ? PT Long Term Goals   ? ?  ? PT LONG TERM GOAL #1  ? Title Patient will be independent with Advanced HEP and self-management strategies to improve functional outcomes   ? Time 4   ? Period Weeks   ? Status Achieved   ? Target Date 02/21/21   ?  ? PT LONG TERM GOAL #2  ? Title Patient will report at least 65% overall improvement in subjective complaint to indicate improvement in ability to perform ADLs.   ? Time 4   ? Period Weeks   ? Status Achieved   ? Target Date 02/21/21   ?  ? PT LONG TERM GOAL #3  ? Title Patient will have LT shoulder flexion AROM at least 105 degrees for improved ability for dressing and ADLs   ? Time 4   ? Period Weeks   ? Status Achieved   ? Target Date 02/21/21   ?  ? PT LONG TERM GOAL #4  ? Title Patient will demonstrate 5/5 shoulder flexion strength without shoulder hike for improved ability to curl her hair.   ? Time 6   ? Period Weeks   ? Status  Partially Met  ? Target Date 03/30/21   ? ?  ?  ? ?  ? ? ? Plan -  ? ? Clinical Impression Statement Patient has met 1/1 short term goals and 3/4 long term goals with ability to complete HEP and improvement in symptoms/function, ROM, strength, and functional mobility. She continues to remain limited by impaired strength and ROM but has  made improvement since beginning therapy. Patient has been compliant with HEP and feels ready to transition to self management to continue to progress deficit. Patient educated on returning to physical therap

## 2021-03-20 ENCOUNTER — Encounter (HOSPITAL_COMMUNITY): Payer: Medicare HMO | Admitting: Physical Therapy

## 2021-03-22 ENCOUNTER — Encounter (HOSPITAL_COMMUNITY): Payer: Medicare HMO | Admitting: Physical Therapy

## 2021-03-27 ENCOUNTER — Encounter (HOSPITAL_COMMUNITY): Payer: Medicare HMO | Admitting: Physical Therapy

## 2021-03-27 DIAGNOSIS — M170 Bilateral primary osteoarthritis of knee: Secondary | ICD-10-CM | POA: Diagnosis not present

## 2021-03-28 ENCOUNTER — Encounter (HOSPITAL_COMMUNITY): Payer: Medicare HMO | Admitting: Physical Therapy

## 2021-03-29 ENCOUNTER — Encounter (HOSPITAL_COMMUNITY): Payer: Medicare HMO | Admitting: Physical Therapy

## 2021-04-03 ENCOUNTER — Encounter (HOSPITAL_COMMUNITY): Payer: Medicare HMO | Admitting: Physical Therapy

## 2021-04-05 ENCOUNTER — Encounter (HOSPITAL_COMMUNITY): Payer: Medicare HMO | Admitting: Physical Therapy

## 2021-04-10 ENCOUNTER — Encounter (HOSPITAL_COMMUNITY): Payer: Medicare HMO | Admitting: Physical Therapy

## 2021-04-12 ENCOUNTER — Encounter (HOSPITAL_COMMUNITY): Payer: Medicare HMO | Admitting: Physical Therapy

## 2021-04-17 DIAGNOSIS — M170 Bilateral primary osteoarthritis of knee: Secondary | ICD-10-CM | POA: Diagnosis not present

## 2021-04-24 DIAGNOSIS — M170 Bilateral primary osteoarthritis of knee: Secondary | ICD-10-CM | POA: Diagnosis not present

## 2021-05-08 DIAGNOSIS — M170 Bilateral primary osteoarthritis of knee: Secondary | ICD-10-CM | POA: Diagnosis not present

## 2021-05-15 DIAGNOSIS — M1711 Unilateral primary osteoarthritis, right knee: Secondary | ICD-10-CM | POA: Diagnosis not present

## 2021-05-25 DIAGNOSIS — M1712 Unilateral primary osteoarthritis, left knee: Secondary | ICD-10-CM | POA: Diagnosis not present

## 2021-06-06 DIAGNOSIS — E039 Hypothyroidism, unspecified: Secondary | ICD-10-CM | POA: Diagnosis not present

## 2021-06-06 DIAGNOSIS — E785 Hyperlipidemia, unspecified: Secondary | ICD-10-CM | POA: Diagnosis not present

## 2021-06-06 DIAGNOSIS — Z79811 Long term (current) use of aromatase inhibitors: Secondary | ICD-10-CM | POA: Diagnosis not present

## 2021-06-06 DIAGNOSIS — G4700 Insomnia, unspecified: Secondary | ICD-10-CM | POA: Diagnosis not present

## 2021-06-06 DIAGNOSIS — C50919 Malignant neoplasm of unspecified site of unspecified female breast: Secondary | ICD-10-CM | POA: Diagnosis not present

## 2021-06-06 DIAGNOSIS — R32 Unspecified urinary incontinence: Secondary | ICD-10-CM | POA: Diagnosis not present

## 2021-06-06 DIAGNOSIS — N3281 Overactive bladder: Secondary | ICD-10-CM | POA: Diagnosis not present

## 2021-06-06 DIAGNOSIS — G8929 Other chronic pain: Secondary | ICD-10-CM | POA: Diagnosis not present

## 2021-06-06 DIAGNOSIS — R69 Illness, unspecified: Secondary | ICD-10-CM | POA: Diagnosis not present

## 2021-06-06 DIAGNOSIS — Z79620 Long term (current) use of immunosuppressive biologic: Secondary | ICD-10-CM | POA: Diagnosis not present

## 2021-06-06 DIAGNOSIS — I10 Essential (primary) hypertension: Secondary | ICD-10-CM | POA: Diagnosis not present

## 2021-06-06 DIAGNOSIS — M810 Age-related osteoporosis without current pathological fracture: Secondary | ICD-10-CM | POA: Diagnosis not present

## 2021-06-07 DIAGNOSIS — M25562 Pain in left knee: Secondary | ICD-10-CM | POA: Diagnosis not present

## 2021-06-07 DIAGNOSIS — R531 Weakness: Secondary | ICD-10-CM | POA: Diagnosis not present

## 2021-06-07 DIAGNOSIS — R2689 Other abnormalities of gait and mobility: Secondary | ICD-10-CM | POA: Diagnosis not present

## 2021-06-07 DIAGNOSIS — M25662 Stiffness of left knee, not elsewhere classified: Secondary | ICD-10-CM | POA: Diagnosis not present

## 2021-06-09 DIAGNOSIS — M25562 Pain in left knee: Secondary | ICD-10-CM | POA: Diagnosis not present

## 2021-06-09 DIAGNOSIS — M25662 Stiffness of left knee, not elsewhere classified: Secondary | ICD-10-CM | POA: Diagnosis not present

## 2021-06-09 DIAGNOSIS — R531 Weakness: Secondary | ICD-10-CM | POA: Diagnosis not present

## 2021-06-09 DIAGNOSIS — R2689 Other abnormalities of gait and mobility: Secondary | ICD-10-CM | POA: Diagnosis not present

## 2021-06-12 DIAGNOSIS — R531 Weakness: Secondary | ICD-10-CM | POA: Diagnosis not present

## 2021-06-12 DIAGNOSIS — M25662 Stiffness of left knee, not elsewhere classified: Secondary | ICD-10-CM | POA: Diagnosis not present

## 2021-06-12 DIAGNOSIS — M25562 Pain in left knee: Secondary | ICD-10-CM | POA: Diagnosis not present

## 2021-06-12 DIAGNOSIS — E1169 Type 2 diabetes mellitus with other specified complication: Secondary | ICD-10-CM | POA: Diagnosis not present

## 2021-06-12 DIAGNOSIS — R2689 Other abnormalities of gait and mobility: Secondary | ICD-10-CM | POA: Diagnosis not present

## 2021-06-14 DIAGNOSIS — R2689 Other abnormalities of gait and mobility: Secondary | ICD-10-CM | POA: Diagnosis not present

## 2021-06-14 DIAGNOSIS — R531 Weakness: Secondary | ICD-10-CM | POA: Diagnosis not present

## 2021-06-14 DIAGNOSIS — M25662 Stiffness of left knee, not elsewhere classified: Secondary | ICD-10-CM | POA: Diagnosis not present

## 2021-06-14 DIAGNOSIS — M25562 Pain in left knee: Secondary | ICD-10-CM | POA: Diagnosis not present

## 2021-06-16 ENCOUNTER — Ambulatory Visit (HOSPITAL_COMMUNITY): Payer: Medicare HMO

## 2021-06-19 DIAGNOSIS — I10 Essential (primary) hypertension: Secondary | ICD-10-CM | POA: Diagnosis not present

## 2021-06-19 DIAGNOSIS — R2689 Other abnormalities of gait and mobility: Secondary | ICD-10-CM | POA: Diagnosis not present

## 2021-06-19 DIAGNOSIS — M25662 Stiffness of left knee, not elsewhere classified: Secondary | ICD-10-CM | POA: Diagnosis not present

## 2021-06-19 DIAGNOSIS — E1169 Type 2 diabetes mellitus with other specified complication: Secondary | ICD-10-CM | POA: Diagnosis not present

## 2021-06-19 DIAGNOSIS — M25562 Pain in left knee: Secondary | ICD-10-CM | POA: Diagnosis not present

## 2021-06-19 DIAGNOSIS — R7309 Other abnormal glucose: Secondary | ICD-10-CM | POA: Diagnosis not present

## 2021-06-19 DIAGNOSIS — G4700 Insomnia, unspecified: Secondary | ICD-10-CM | POA: Diagnosis not present

## 2021-06-19 DIAGNOSIS — R531 Weakness: Secondary | ICD-10-CM | POA: Diagnosis not present

## 2021-06-22 DIAGNOSIS — M25562 Pain in left knee: Secondary | ICD-10-CM | POA: Diagnosis not present

## 2021-06-22 DIAGNOSIS — M25662 Stiffness of left knee, not elsewhere classified: Secondary | ICD-10-CM | POA: Diagnosis not present

## 2021-06-22 DIAGNOSIS — R531 Weakness: Secondary | ICD-10-CM | POA: Diagnosis not present

## 2021-06-22 DIAGNOSIS — R2689 Other abnormalities of gait and mobility: Secondary | ICD-10-CM | POA: Diagnosis not present

## 2021-06-26 DIAGNOSIS — R531 Weakness: Secondary | ICD-10-CM | POA: Diagnosis not present

## 2021-06-26 DIAGNOSIS — R2689 Other abnormalities of gait and mobility: Secondary | ICD-10-CM | POA: Diagnosis not present

## 2021-06-26 DIAGNOSIS — M25662 Stiffness of left knee, not elsewhere classified: Secondary | ICD-10-CM | POA: Diagnosis not present

## 2021-06-26 DIAGNOSIS — M25562 Pain in left knee: Secondary | ICD-10-CM | POA: Diagnosis not present

## 2021-06-28 DIAGNOSIS — M25662 Stiffness of left knee, not elsewhere classified: Secondary | ICD-10-CM | POA: Diagnosis not present

## 2021-06-28 DIAGNOSIS — R531 Weakness: Secondary | ICD-10-CM | POA: Diagnosis not present

## 2021-06-28 DIAGNOSIS — R2689 Other abnormalities of gait and mobility: Secondary | ICD-10-CM | POA: Diagnosis not present

## 2021-06-28 DIAGNOSIS — M25562 Pain in left knee: Secondary | ICD-10-CM | POA: Diagnosis not present

## 2021-07-03 DIAGNOSIS — R2689 Other abnormalities of gait and mobility: Secondary | ICD-10-CM | POA: Diagnosis not present

## 2021-07-03 DIAGNOSIS — R531 Weakness: Secondary | ICD-10-CM | POA: Diagnosis not present

## 2021-07-03 DIAGNOSIS — M25662 Stiffness of left knee, not elsewhere classified: Secondary | ICD-10-CM | POA: Diagnosis not present

## 2021-07-03 DIAGNOSIS — M25562 Pain in left knee: Secondary | ICD-10-CM | POA: Diagnosis not present

## 2021-07-06 DIAGNOSIS — M25662 Stiffness of left knee, not elsewhere classified: Secondary | ICD-10-CM | POA: Diagnosis not present

## 2021-07-06 DIAGNOSIS — M25562 Pain in left knee: Secondary | ICD-10-CM | POA: Diagnosis not present

## 2021-07-06 DIAGNOSIS — R531 Weakness: Secondary | ICD-10-CM | POA: Diagnosis not present

## 2021-07-06 DIAGNOSIS — R2689 Other abnormalities of gait and mobility: Secondary | ICD-10-CM | POA: Diagnosis not present

## 2021-07-12 DIAGNOSIS — M25662 Stiffness of left knee, not elsewhere classified: Secondary | ICD-10-CM | POA: Diagnosis not present

## 2021-07-12 DIAGNOSIS — R2689 Other abnormalities of gait and mobility: Secondary | ICD-10-CM | POA: Diagnosis not present

## 2021-07-12 DIAGNOSIS — R531 Weakness: Secondary | ICD-10-CM | POA: Diagnosis not present

## 2021-07-12 DIAGNOSIS — M25562 Pain in left knee: Secondary | ICD-10-CM | POA: Diagnosis not present

## 2021-07-13 DIAGNOSIS — M1712 Unilateral primary osteoarthritis, left knee: Secondary | ICD-10-CM | POA: Diagnosis not present

## 2021-07-17 DIAGNOSIS — R531 Weakness: Secondary | ICD-10-CM | POA: Diagnosis not present

## 2021-07-17 DIAGNOSIS — E559 Vitamin D deficiency, unspecified: Secondary | ICD-10-CM | POA: Diagnosis not present

## 2021-07-17 DIAGNOSIS — M25562 Pain in left knee: Secondary | ICD-10-CM | POA: Diagnosis not present

## 2021-07-17 DIAGNOSIS — M810 Age-related osteoporosis without current pathological fracture: Secondary | ICD-10-CM | POA: Diagnosis not present

## 2021-07-17 DIAGNOSIS — R2689 Other abnormalities of gait and mobility: Secondary | ICD-10-CM | POA: Diagnosis not present

## 2021-07-17 DIAGNOSIS — M25662 Stiffness of left knee, not elsewhere classified: Secondary | ICD-10-CM | POA: Diagnosis not present

## 2021-07-19 DIAGNOSIS — R2689 Other abnormalities of gait and mobility: Secondary | ICD-10-CM | POA: Diagnosis not present

## 2021-07-19 DIAGNOSIS — M25562 Pain in left knee: Secondary | ICD-10-CM | POA: Diagnosis not present

## 2021-07-19 DIAGNOSIS — R531 Weakness: Secondary | ICD-10-CM | POA: Diagnosis not present

## 2021-07-19 DIAGNOSIS — M25662 Stiffness of left knee, not elsewhere classified: Secondary | ICD-10-CM | POA: Diagnosis not present

## 2021-07-21 IMAGING — CT CT ANGIO CHEST
2 of 6 series · 18 of 46 positions shown · IV contrast (Omnipaque or Isovue)
Comparison: 02/06/2019

CLINICAL DATA: Positive D-dimer, left-sided chest pain, history of
breast cancer

EXAM:
CT ANGIOGRAPHY CHEST WITH CONTRAST
TECHNIQUE: Multidetector CT imaging of the chest was performed using the
standard protocol during bolus administration of intravenous
contrast. Multiplanar CT image reconstructions and MIPs were
obtained to evaluate the vascular anatomy.
CONTRAST:  100mL OMNIPAQUE IOHEXOL 350 MG/ML SOLN

[Series 6: pe axial thins · axial · 0.70mm/px · z∈[-298,-46]mm · 15 of 276 slices shown]
[im 12/276  lung]
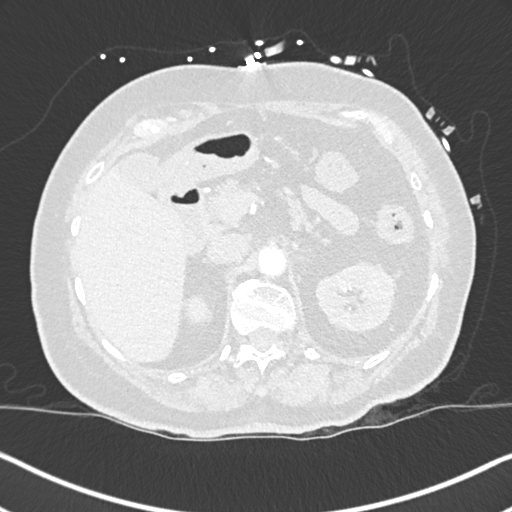
[im 36/276  soft-tissue]
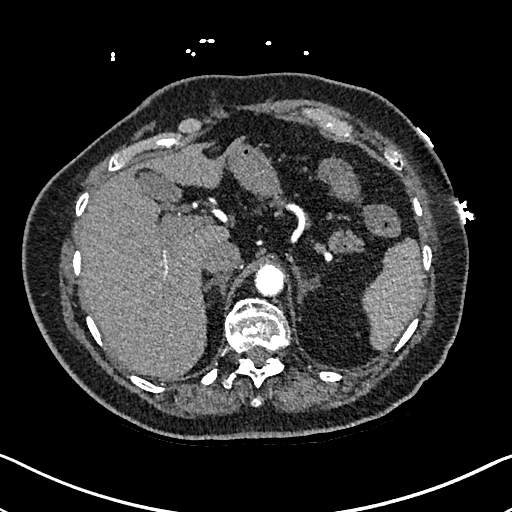
[im 48/276  lung]
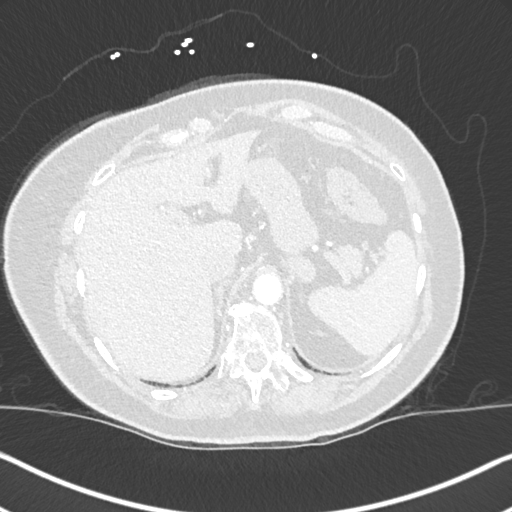
[im 72/276  soft-tissue]
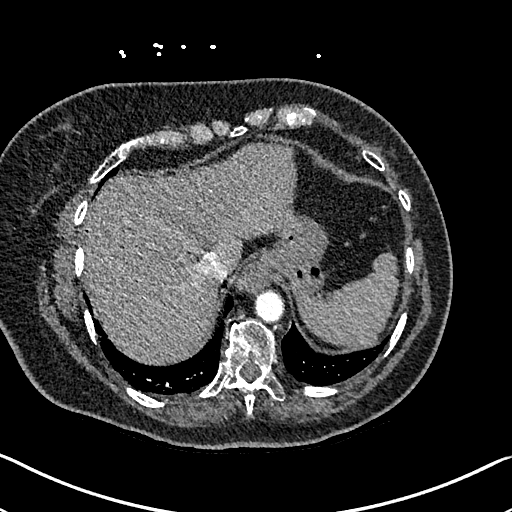
[im 84/276  lung]
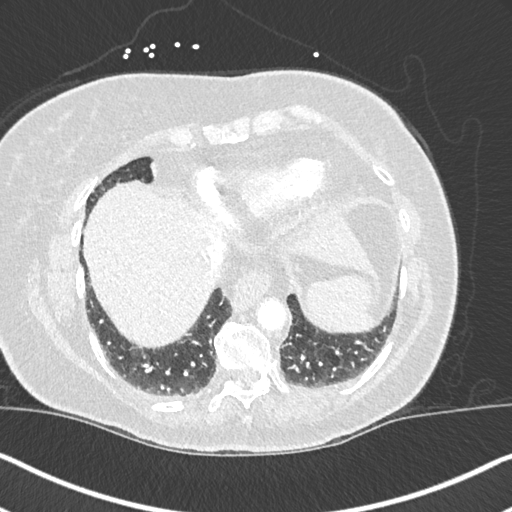
[im 108/276  soft-tissue]
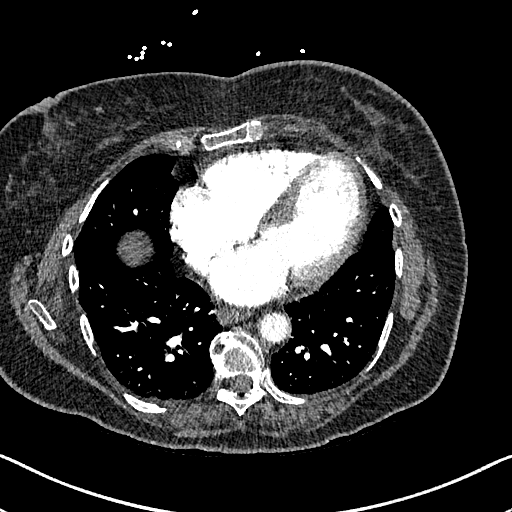
[im 120/276  lung]
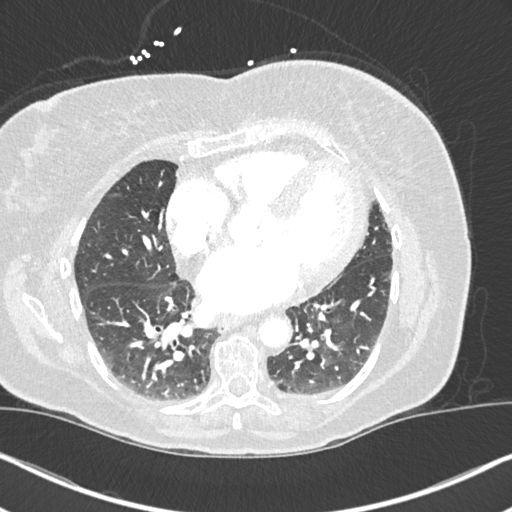
[im 144/276  soft-tissue]
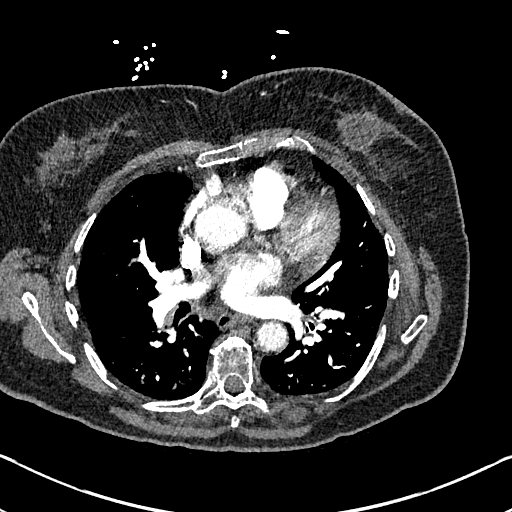
[im 156/276  lung]
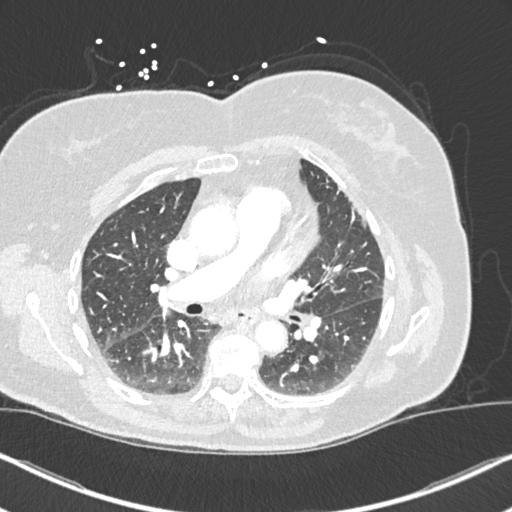
[im 168/276  soft-tissue]
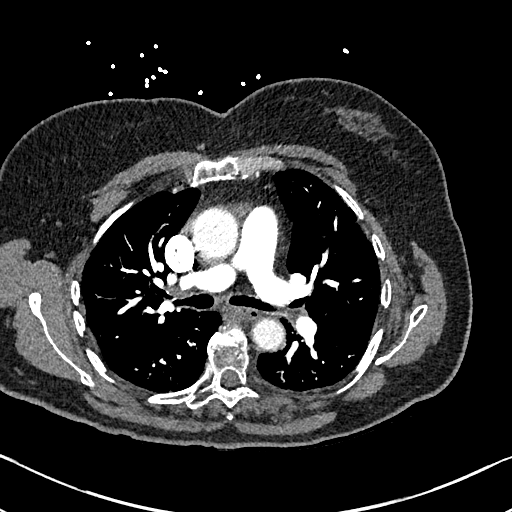
[im 192/276  lung]
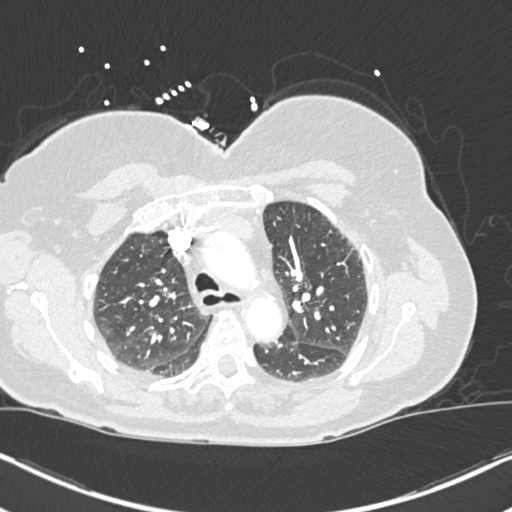
[im 204/276  soft-tissue]
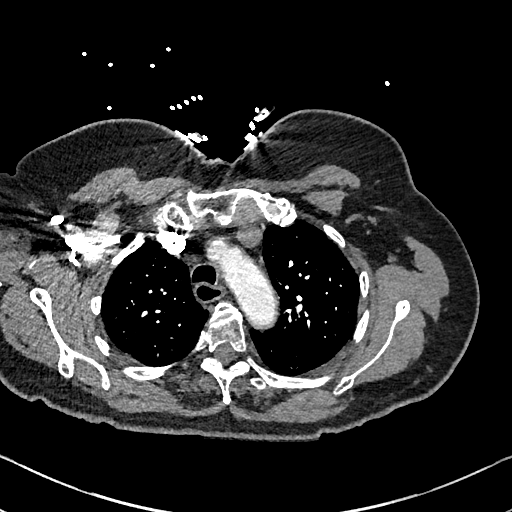
[im 228/276  lung]
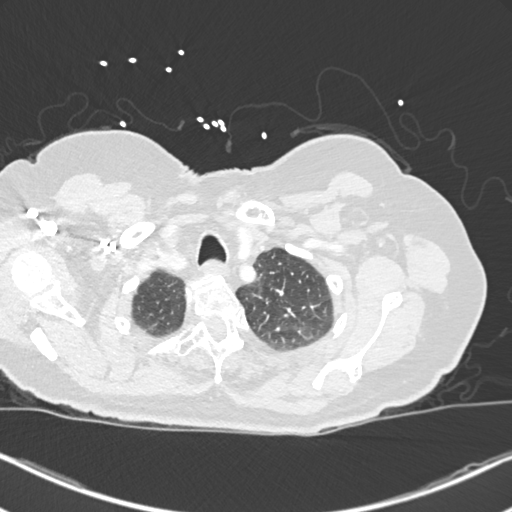
[im 240/276  soft-tissue]
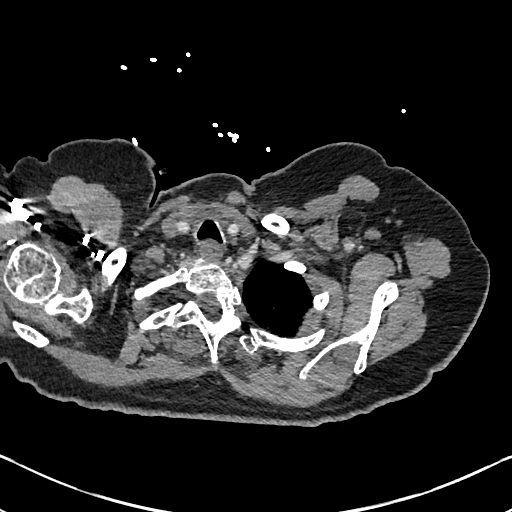
[im 264/276  lung]
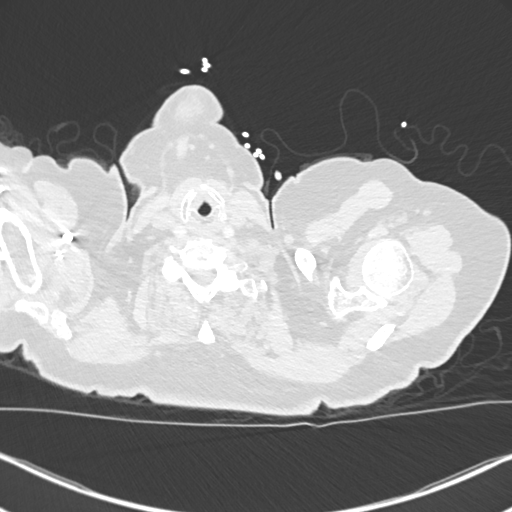

[Series 8: cor soft · coronal · 0.54mm/px · 3 of 139 slices shown]
[im 35/139  soft-tissue]
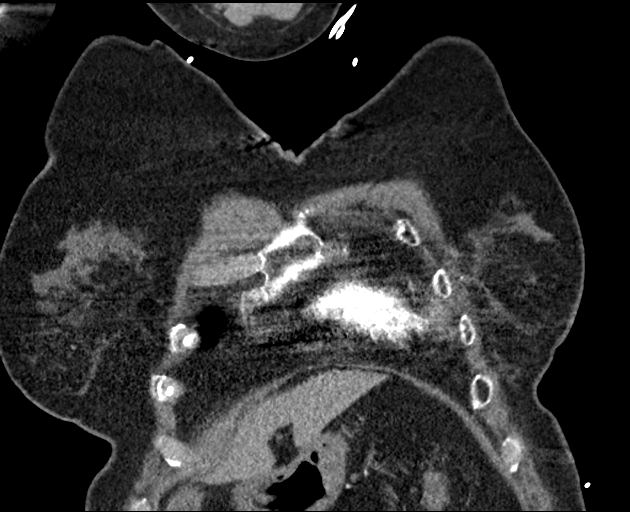
[im 70/139  soft-tissue]
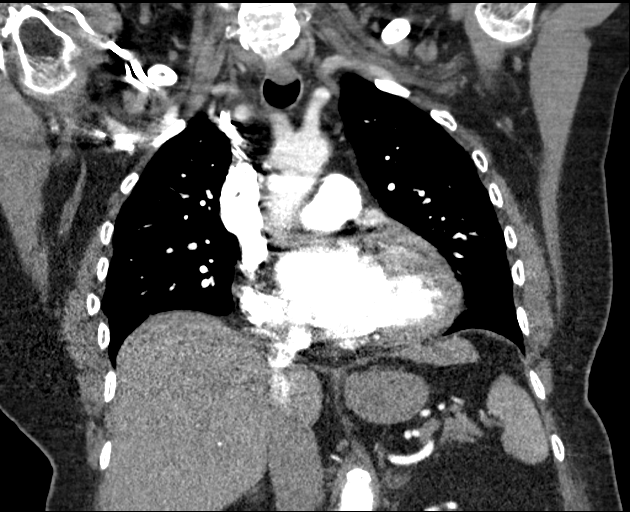
[im 104/139  soft-tissue]
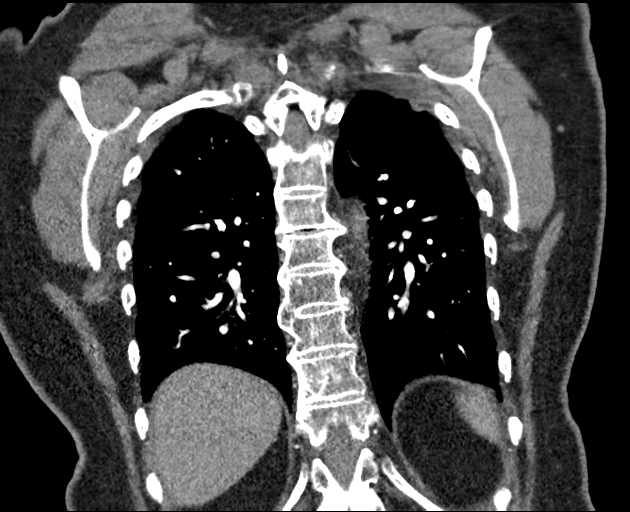

[18 of 46 positions shown; findings below may reference images not displayed]

FINDINGS: Cardiovascular: This is a technically adequate evaluation of the
pulmonary vasculature. No filling defects or pulmonary emboli. The
heart is enlarged without pericardial effusion. Minimal
atherosclerosis within the aortic arch.

Mediastinum/Nodes: No enlarged mediastinal, hilar, or axillary lymph
nodes. Thyroid gland, trachea, and esophagus demonstrate no
significant findings.

Lungs/Pleura: The central airways no acute airspace disease,
effusion, or are widely pneumothorax. Patent.

Upper Abdomen: No acute abnormality.

Musculoskeletal: There are no acute or destructive bony lesions.
Reconstructed images demonstrate no additional findings.

Postsurgical changes are seen within the left breast compatible with
prior lumpectomy for breast cancer.

Review of the MIP images confirms the above findings.
IMPRESSION: 1. No evidence of pulmonary embolus.
2. Cardiomegaly.
3. No acute airspace disease.
4.  Aortic Atherosclerosis ([id number]).

## 2021-07-21 IMAGING — DX DG CHEST 2V
2 series · 2 of 2 positions shown · non-contrast
Comparison: 03/01/2014

CLINICAL DATA: Personal history of left breast cancer, chest pain
not otherwise specified

EXAM:
CHEST - 2 VIEW

[chest pa]
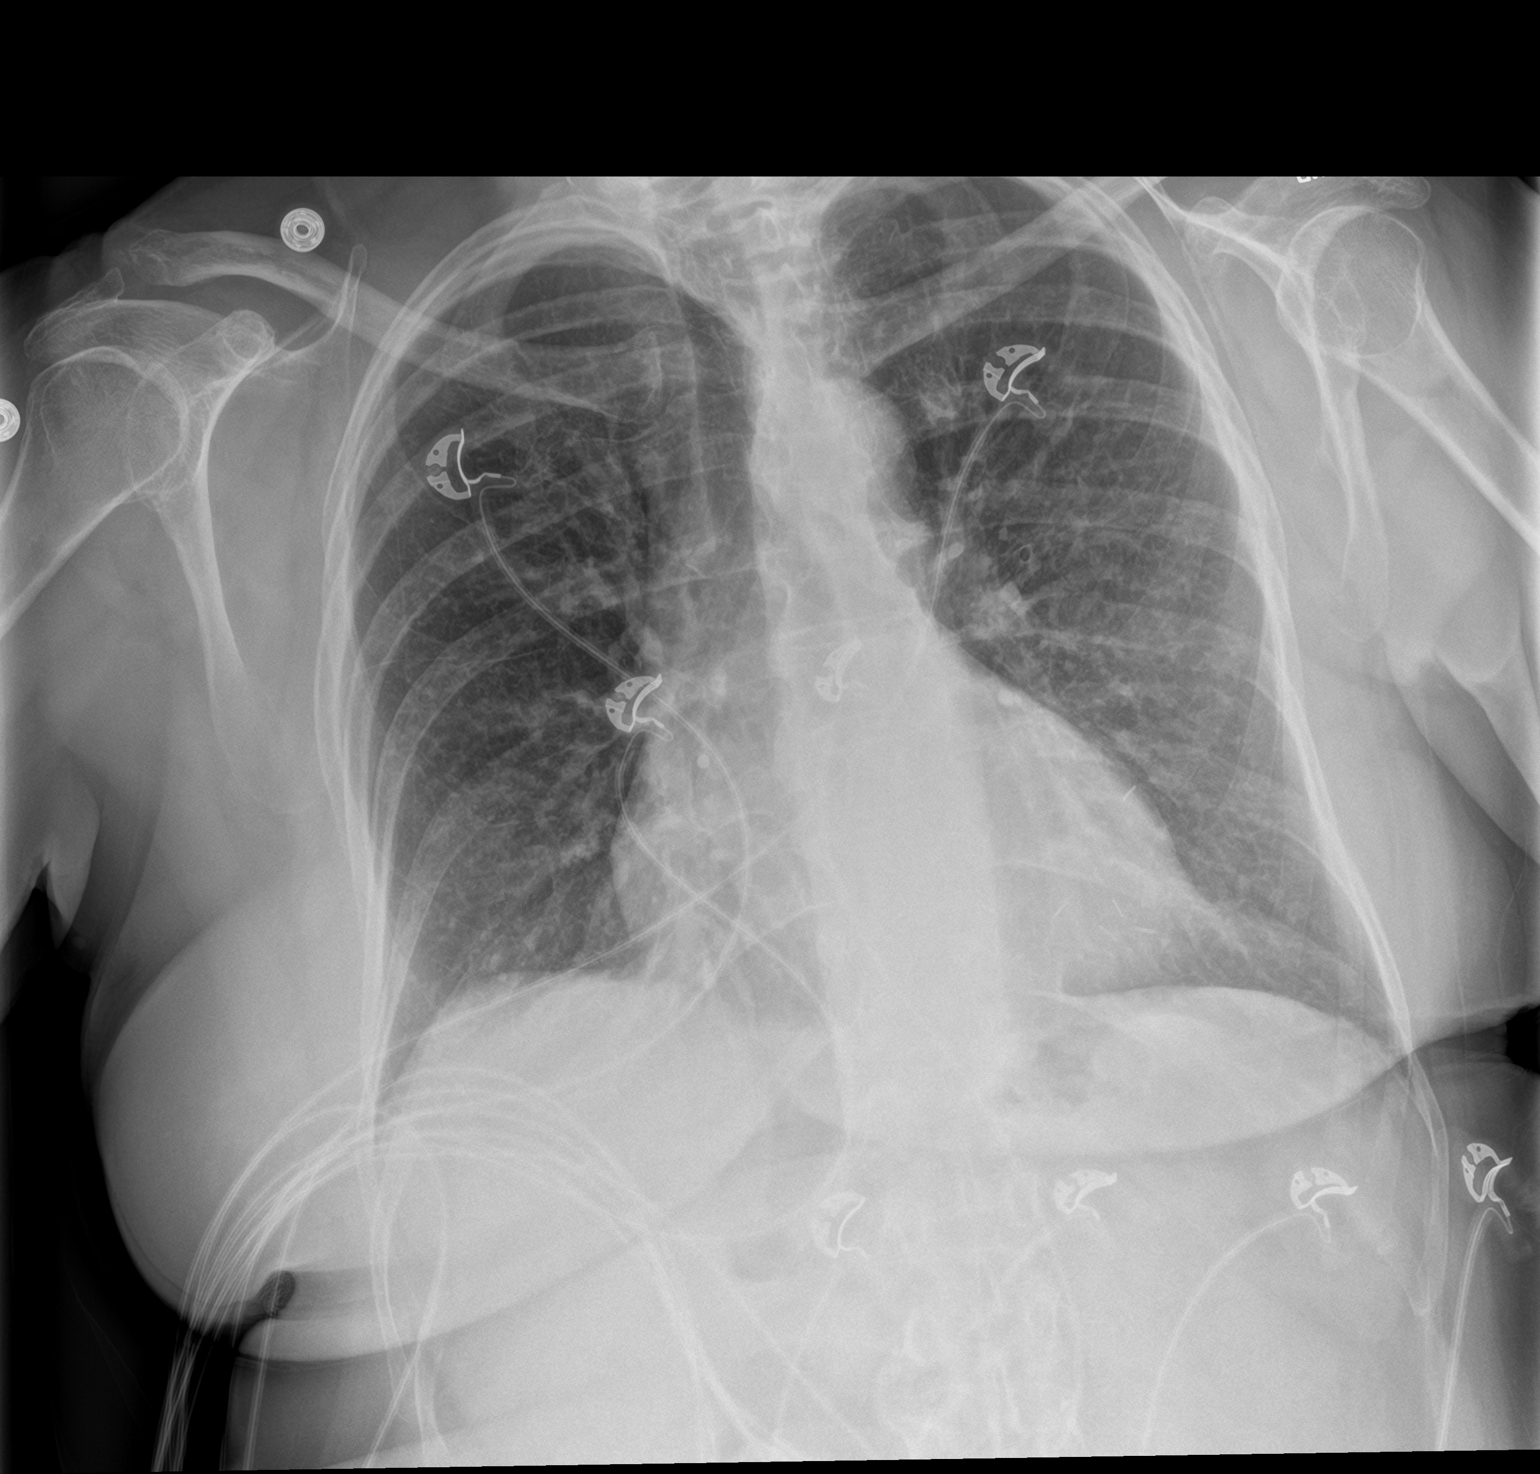

[chest lat]
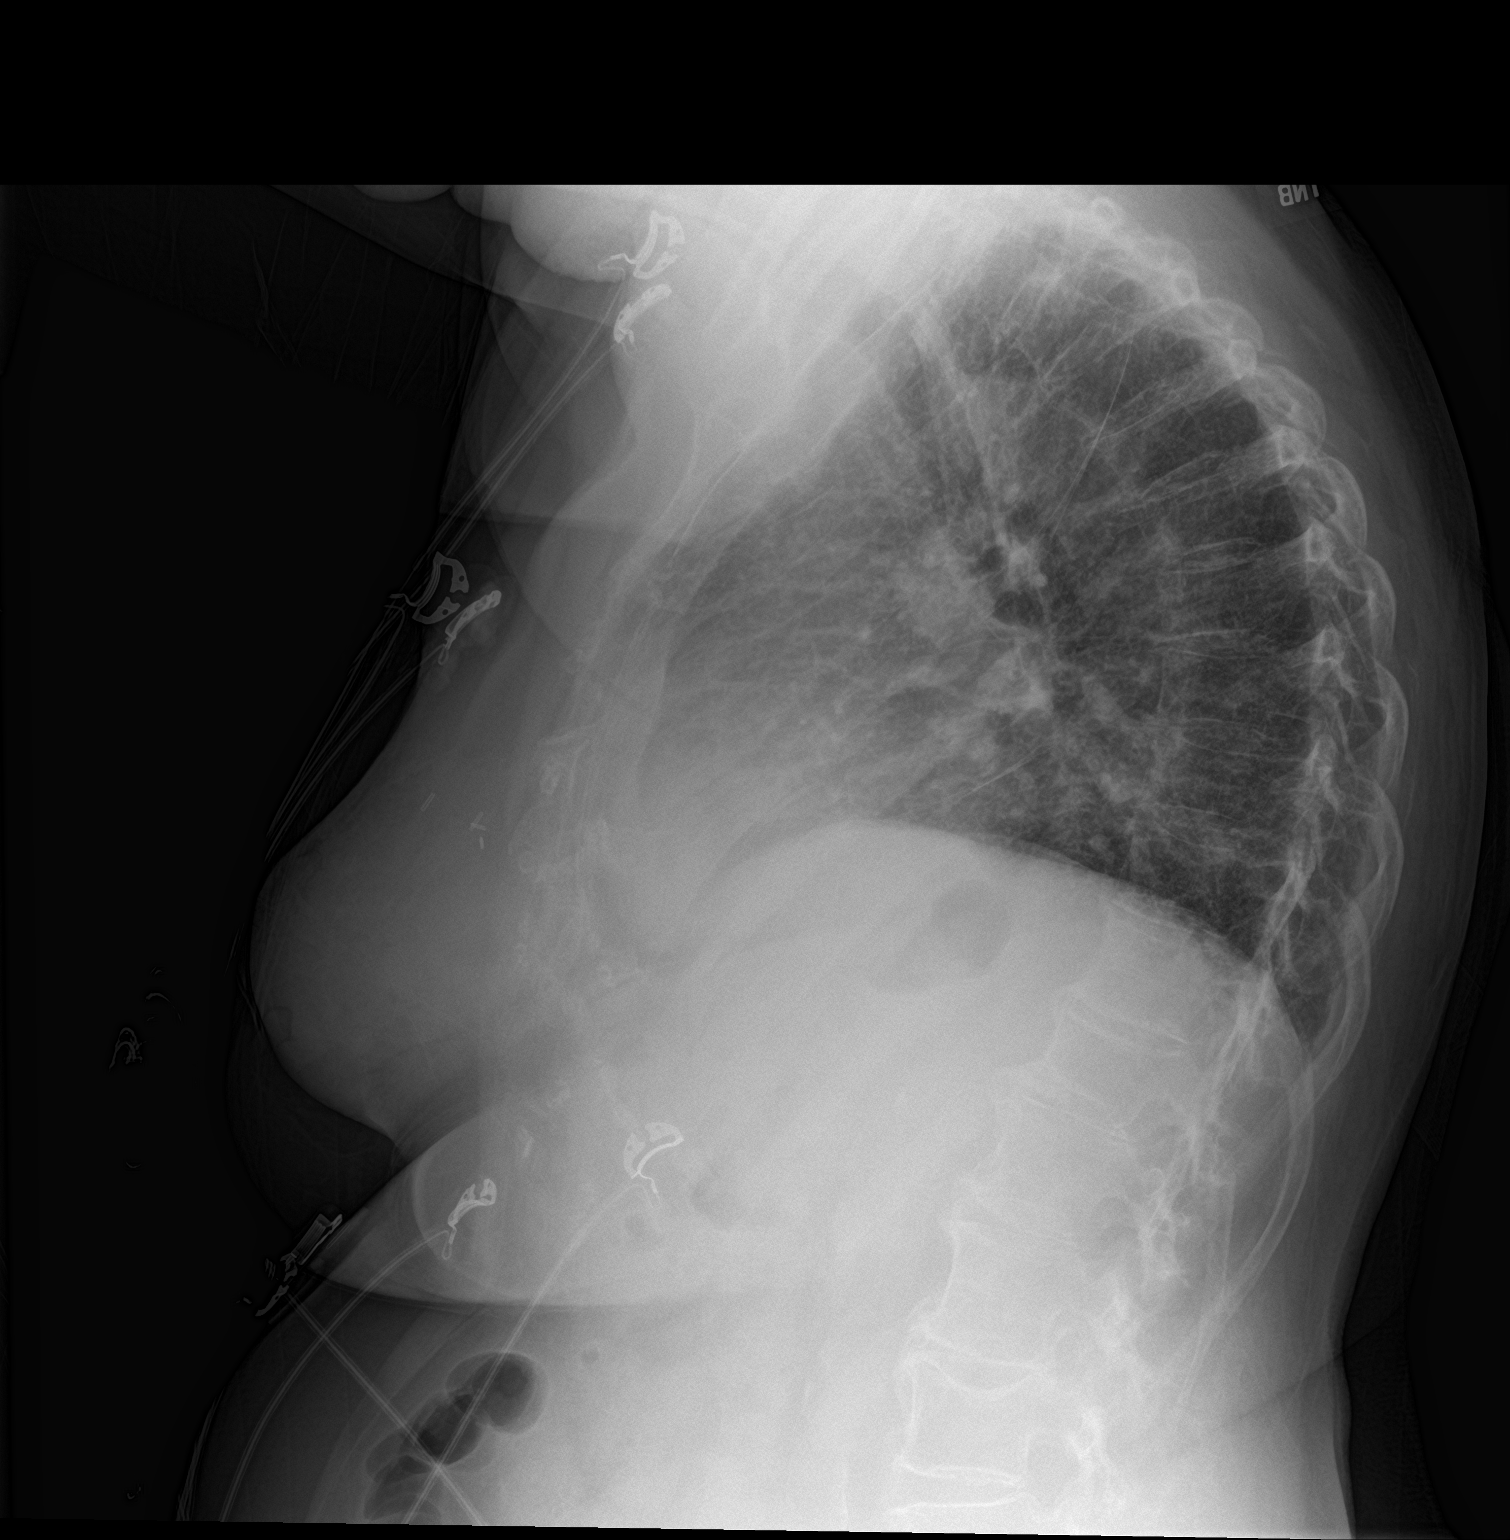

[2 of 2 positions shown; findings below may reference images not displayed]

FINDINGS: Frontal and lateral views of the chest demonstrate postsurgical
changes within the left breast. Cardiac silhouette is unremarkable.
No airspace disease, effusion, or pneumothorax. No acute bony
abnormalities.
IMPRESSION: 1. No acute intrathoracic process.

## 2021-07-25 DIAGNOSIS — R531 Weakness: Secondary | ICD-10-CM | POA: Diagnosis not present

## 2021-07-25 DIAGNOSIS — R2689 Other abnormalities of gait and mobility: Secondary | ICD-10-CM | POA: Diagnosis not present

## 2021-07-25 DIAGNOSIS — M25562 Pain in left knee: Secondary | ICD-10-CM | POA: Diagnosis not present

## 2021-07-25 DIAGNOSIS — M25662 Stiffness of left knee, not elsewhere classified: Secondary | ICD-10-CM | POA: Diagnosis not present

## 2021-07-26 ENCOUNTER — Telehealth: Payer: Self-pay | Admitting: *Deleted

## 2021-07-26 NOTE — Patient Outreach (Signed)
  Care Management   Outreach Note  07/26/2021  Name: Breanna Hill MRN: [id number] DOB: [birth year]  Reason for Referral: Care Coordination - Assessment of Needs.   An unsuccessful telephone outreach was attempted today. The patient was referred to the case management team for assistance with care management and care coordination. A HIPAA compliant message was left on voicemail for patient, providing contact information for CSW, encouraging patient to return the call at her earliest convenience.   Follow Up Plan:  [place] will reschedule initial telephone outreach call for patient with CSW.   Nat Christen, BSW, MSW, LCSW  Licensed Education officer, environmental Health System  [place]ailing [place] [address], [place], [place] [address] Physical Address-[address], [place], [place] [address] Toll Free Main # [phone] Fax # [phone] Cell # [phone] [email or web address]

## 2021-07-27 DIAGNOSIS — M25662 Stiffness of left knee, not elsewhere classified: Secondary | ICD-10-CM | POA: Diagnosis not present

## 2021-07-27 DIAGNOSIS — R2689 Other abnormalities of gait and mobility: Secondary | ICD-10-CM | POA: Diagnosis not present

## 2021-07-27 DIAGNOSIS — R531 Weakness: Secondary | ICD-10-CM | POA: Diagnosis not present

## 2021-07-27 DIAGNOSIS — M25562 Pain in left knee: Secondary | ICD-10-CM | POA: Diagnosis not present

## 2021-08-01 DIAGNOSIS — R2689 Other abnormalities of gait and mobility: Secondary | ICD-10-CM | POA: Diagnosis not present

## 2021-08-01 DIAGNOSIS — M25662 Stiffness of left knee, not elsewhere classified: Secondary | ICD-10-CM | POA: Diagnosis not present

## 2021-08-01 DIAGNOSIS — M25562 Pain in left knee: Secondary | ICD-10-CM | POA: Diagnosis not present

## 2021-08-01 DIAGNOSIS — R531 Weakness: Secondary | ICD-10-CM | POA: Diagnosis not present

## 2021-08-04 ENCOUNTER — Other Ambulatory Visit: Payer: Self-pay | Admitting: *Deleted

## 2021-08-04 DIAGNOSIS — R2689 Other abnormalities of gait and mobility: Secondary | ICD-10-CM | POA: Diagnosis not present

## 2021-08-04 DIAGNOSIS — R531 Weakness: Secondary | ICD-10-CM | POA: Diagnosis not present

## 2021-08-04 DIAGNOSIS — M25562 Pain in left knee: Secondary | ICD-10-CM | POA: Diagnosis not present

## 2021-08-04 DIAGNOSIS — M25662 Stiffness of left knee, not elsewhere classified: Secondary | ICD-10-CM | POA: Diagnosis not present

## 2021-08-04 NOTE — Patient Outreach (Signed)
  Care Coordination   08/04/2021  Name: NYAH SHEPHERD MRN: [id number] DOB: 19-Apr-1940   Care Coordination Outreach Attempts:  A second unsuccessful outreach was attempted today to offer the patient with information about available care coordination services as a benefit of their health plan.   A HIPAA compliant message was left on voicemail, providing contact information for CSW, encouraging patient to return CSW's call at her earliest convenience.  Follow Up Plan:  Additional outreach attempts will be made to offer the patient care coordination information and services.   Encounter Outcome:  No Answer.   Care Coordination Interventions Activated:  No   Care Coordination Interventions:  No, not indicated.    Nat Christen, BSW, MSW, LCSW  Licensed Education officer, environmental Health System  Mailing [place] [address] [place], [place] [address] Physical Address-[address], [place], [place] [address] Toll Free Main # [phone] Fax # [phone] Cell # [phone] [email or web address]

## 2021-08-09 ENCOUNTER — Ambulatory Visit: Payer: Self-pay | Admitting: *Deleted

## 2021-08-09 DIAGNOSIS — M25562 Pain in left knee: Secondary | ICD-10-CM | POA: Diagnosis not present

## 2021-08-09 DIAGNOSIS — M25662 Stiffness of left knee, not elsewhere classified: Secondary | ICD-10-CM | POA: Diagnosis not present

## 2021-08-09 DIAGNOSIS — R2689 Other abnormalities of gait and mobility: Secondary | ICD-10-CM | POA: Diagnosis not present

## 2021-08-09 DIAGNOSIS — R531 Weakness: Secondary | ICD-10-CM | POA: Diagnosis not present

## 2021-08-10 ENCOUNTER — Encounter: Payer: Self-pay | Admitting: *Deleted

## 2021-08-10 NOTE — Patient Instructions (Signed)
Visit Information  Thank you for taking time to visit with me today. Please don't hesitate to contact me if I can be of assistance to you.   Please call the care guide team at [phone] if you need to cancel or reschedule your appointment.   If you are experiencing a Mental Health or Behavioral Health Crisis or need someone to talk to, please call the Suicide and Crisis Lifeline: 988 call the [place] National Suicide Prevention Lifeline: 1-800-273-8255 or TTY: 1-800-799-4 TTY (1-800-799-4889) to talk to a trained counselor call 1-800-273-TALK (toll free, 24 hour hotline) go to [facility] [address], [place] ([phone]) call the [place] Crisis Line: 800-939-9988 call 911  Patient verbalizes understanding of instructions and care plan provided today and agrees to view in MyChart. Active MyChart status and patient understanding of how to access instructions and care plan via MyChart confirmed with patient.     No further follow up required.  Sanoe Hazan, BSW, MSW, LCSW  Licensed Clinical Social Worker  [facility]  Mailing Address-[address], [place], [place] [address] Physical Address-[address], , [place] [address] Toll Free Main # [phone] Fax # [phone] Cell # [phone] [email or web address]            

## 2021-08-10 NOTE — Patient Outreach (Signed)
  Care Coordination   Initial Visit Note   08/10/2021  Name: Breanna Hill MRN: [id number] DOB: September 05, 1940  Breanna Hill is a 81 y.o. year old female who sees Asencion Noble, MD for primary care. I spoke with Breanna Hill by phone today.  What matters to the patients health and wellness today?  No Intervention Identified.   Goals Addressed   None     SDOH assessments and interventions completed:  Yes  SDOH Interventions Today    Flowsheet Row Most Recent Value  SDOH Interventions   Food Insecurity Interventions Intervention Not Indicated  Financial Strain Interventions Intervention Not Indicated  Housing Interventions Intervention Not Indicated  Physical Activity Interventions Patient Refused  Stress Interventions Intervention Not Indicated  Social Connections Interventions Intervention Not Indicated  Transportation Interventions Intervention Not Indicated        Care Coordination Interventions Activated:  Yes   Care Coordination Interventions:  Yes, provided.   Follow up plan: No further intervention required.   Encounter Outcome:  Pt. Visit Completed.   Nat Christen, BSW, MSW, LCSW  Licensed Education officer, [facility]  Mailing [place] [address], [place], [place] [address] Physical Address-[address] [place], [place] [address] Toll Free Main # [phone] Fax # [phone] Cell # [phone] [email or web address]

## 2021-09-11 ENCOUNTER — Other Ambulatory Visit: Payer: Self-pay | Admitting: Urology

## 2021-09-21 DIAGNOSIS — E755 Other lipid storage disorders: Secondary | ICD-10-CM | POA: Diagnosis not present

## 2021-09-25 ENCOUNTER — Other Ambulatory Visit: Payer: Self-pay | Admitting: Internal Medicine

## 2021-09-25 DIAGNOSIS — Z1231 Encounter for screening mammogram for malignant neoplasm of breast: Secondary | ICD-10-CM

## 2021-10-03 ENCOUNTER — Ambulatory Visit (INDEPENDENT_AMBULATORY_CARE_PROVIDER_SITE_OTHER): Payer: Medicare HMO | Admitting: Urology

## 2021-10-03 VITALS — BP 153/74 | HR 85

## 2021-10-03 DIAGNOSIS — R350 Frequency of micturition: Secondary | ICD-10-CM

## 2021-10-03 DIAGNOSIS — R3915 Urgency of urination: Secondary | ICD-10-CM

## 2021-10-03 DIAGNOSIS — N3281 Overactive bladder: Secondary | ICD-10-CM

## 2021-10-03 NOTE — Progress Notes (Signed)
History of Present Illness: Breanna Hill is a 81 y.o. year old female here for followup of OAB.  She is on trospium as well as Gemtesa.  She takes her Logan Bores every other day.  With those 2 medications her overactive bladder symptoms are tolerable.  She has not had any urinary tract infections over the past year.  She is bothered by arthritis.  Past Medical History:  Diagnosis Date   Arthritis    R hand    Breast cancer ([place])    Breast disorder    cancer   Cancer ([place])    breast CA- diagnosed with needle biopsy   Depression    GERD (gastroesophageal reflux disease)    rare use of tums    High cholesterol 10/17/2015   History of radiation therapy 03/27/17- 04/23/17   Left Breast, 2.67 Gy in 15 fractions for a total dose of 40.05 Gy. Boost, 2 Gy in 5 fractions for a total dose of 10 Gy.    Hypertension    Hypothyroidism    Pre-diabetes    Hgba1C- in the 6 's , per pt., she monitors her diet    Past Surgical History:  Procedure Laterality Date   BACK SURGERY  1973   BREAST LUMPECTOMY Left 02/2017   BREAST LUMPECTOMY WITH RADIOACTIVE SEED AND SENTINEL LYMPH NODE BIOPSY Left 02/27/2017   Procedure: LEFT BREAST LUMPECTOMY WITH RADIOACTIVE SEED AND LEFT AXILLARY SENTINEL LYMPH NODE BIOPSY;  Surgeon: Donnie Mesa, MD;  Location: [facility];  Service: General;  Laterality: Left;   COLONOSCOPY     X 2   COLONOSCOPY N/A 02/22/2016   Procedure: COLONOSCOPY;  Surgeon: Rogene Houston, MD;  [facility];  Service: Endoscopy;  Laterality: N/A;  1:45   EYE SURGERY Bilateral    phlebpheroplasty   Left shoultder surgery     for bone spurs & frozen shoulder    REVERSE SHOULDER ARTHROPLASTY Left 09/14/2020   Procedure: REVERSE SHOULDER ARTHROPLASTY;  Surgeon: Hiram Gash, MD;  [facility];  Service: Orthopedics;  Laterality: Left;   Right bunionectomy     TONSILLECTOMY     TUBAL LIGATION      Home Medications:  (Not in a hospital admission)   Allergies:  Allergies   Allergen Reactions   Penicillins Rash    Has patient had a PCN reaction causing immediate rash, facial/tongue/throat swelling, SOB or lightheadedness with hypotension: No Has patient had a PCN reaction causing severe rash involving mucus membranes or skin necrosis: No Has patient had a PCN reaction that required hospitalization: No Has patient had a PCN reaction occurring within the last 10 years: No If all of the above answers are "NO", then may proceed with Cephalosporin use.     Family History  Problem Relation Age of Onset   Colon cancer Neg Hx     Social History:  reports that she has never smoked. She has never been exposed to tobacco smoke. She has never used smokeless tobacco. She reports current alcohol use. She reports that she does not use drugs.  ROS: A complete review of systems was performed.  All systems are negative except for pertinent findings as noted.  Physical Exam:  Vital signs in last 24 hours: '@VSRANGES'$ @ General:  Alert and oriented, No acute distress HEENT: Normocephalic, atraumatic Neck: No JVD or lymphadenopathy Cardiovascular: Regular rate  Lungs: Normal inspiratory/expiratory excursion Neurologic: Grossly intact  I have reviewed prior pt notes  I have reviewed urinalysis results  Impression/Assessment:  Overactive bladder-on dual medical therapy with adequate success  Plan:  Continue both the trospium twice a day as well as a Gemtesa every other day  I will have her come back in a year for recheck  Jorja Loa 10/03/2021, 8:17 AM  Lillette Boxer. Keyatta Tolles MD

## 2021-10-04 LAB — URINALYSIS, ROUTINE W REFLEX MICROSCOPIC
Bilirubin, UA: NEGATIVE
Glucose, UA: NEGATIVE
Ketones, UA: NEGATIVE
Leukocytes,UA: NEGATIVE
Nitrite, UA: NEGATIVE
Protein,UA: NEGATIVE
RBC, UA: NEGATIVE
Specific Gravity, UA: 1.005 (ref 1.005–1.030)
Urobilinogen, Ur: 0.2 mg/dL (ref 0.2–1.0)
pH, UA: 5 (ref 5.0–7.5)

## 2021-10-17 ENCOUNTER — Other Ambulatory Visit: Payer: Self-pay | Admitting: Hematology and Oncology

## 2021-10-17 ENCOUNTER — Other Ambulatory Visit: Payer: Self-pay | Admitting: Urology

## 2021-10-17 DIAGNOSIS — R350 Frequency of micturition: Secondary | ICD-10-CM

## 2021-10-19 DIAGNOSIS — Z79899 Other long term (current) drug therapy: Secondary | ICD-10-CM | POA: Diagnosis not present

## 2021-10-19 DIAGNOSIS — E118 Type 2 diabetes mellitus with unspecified complications: Secondary | ICD-10-CM | POA: Diagnosis not present

## 2021-10-19 DIAGNOSIS — G4700 Insomnia, unspecified: Secondary | ICD-10-CM | POA: Diagnosis not present

## 2021-10-19 DIAGNOSIS — I10 Essential (primary) hypertension: Secondary | ICD-10-CM | POA: Diagnosis not present

## 2021-10-24 ENCOUNTER — Ambulatory Visit: Payer: Medicare HMO | Admitting: Urology

## 2021-10-25 NOTE — Progress Notes (Addendum)
GI Office Note    Referring Provider: Asencion Noble, MD Primary Care Physician:  Asencion Noble, MD  Primary Gastroenterologist: Harvel Quale, MD  Chief Complaint   Chief Complaint  Patient presents with   Diarrhea    Hard to go to bathroom when she was a young child. Pt says she goes from this problem to having diarrhea. Pt says she goes to have bm it hurts,sometimes has bleeding. She is also having issues with hemorrhoids.    History of Present Illness   Breanna Hill is a 81 y.o. female presenting today at the request of Asencion Noble, MD for diarrhea.   Last colonoscopy 2018 was normal. No repeat due to age and no history of adenomas.   Last seen by Allen Norris, NP 09/30/2017 for diarrhea.  Reports she is having 5-6 stools per day.  Reportedly had a normal stool in the morning and the looser stools during the rest of the day.  She had had recent negative stool studies, negative C. difficile.  This seems to be an intermittent chronic issue for her.  Had complained of diarrhea in 2017 as well existing Imodium twice daily.  She has increased her fiber intake and take Imodium twice daily.  Today: States most of her life she has had controlled diarrhea or constipation. Usually goes twice per day and if she does not then she has a hard bowel movement that is very painful and will bring her to tears. Then she may have some rectal bleeding and then have looser stools after that. Has had hemorrhoid surgery before in the past.  Hard stool frequency is hard to  pinpoint. Sometimes may have one time every few weeks. Stools are never dark, reddish in nature. Has tomatoes at times and can go a half hour after consuming. Currently takes 2 stool softeners once a day ([facility] brand of colace). States when she was a child she was told she needed her rectum stretched. Does report she forgets to drink water. Some days may only drink caffeine. Reports after the hard stool she has an explosion of stool.  Repots she can feel the hardness of her perineum when that happens. Has had to self disimpact at times to be able to go. Has never tried enemas or suppositories. Tries to work on her diet, does not add extra fiber in her diet. Does eat celery, carrots, apples, etc in her diet. Does eat beans several times per week. Has rare abdominal pain. Has some occasional bloating. Denies upper GI symptoms.   Has exercise classes twice per week. Arthritis acts up if she does not exercise. Tries to do stuff in the yard.   Is not using anything for her hemorrhoids. Will use otc cream when needed.    Current Outpatient Medications  Medication Sig Dispense Refill   atorvastatin (LIPITOR) 20 MG tablet Take 20 mg by mouth in the morning.     Calcium Carbonate-Vitamin D (CALCIUM 600+D Hill) Take 1 tablet by mouth in the morning.     Cholecalciferol (VITAMIN D-3) 125 MCG (5000 UT) TABS Take 5,000 Units by mouth in the morning.     letrozole (FEMARA) 2.5 MG tablet TAKE 1 TABLET BY MOUTH EVERY DAY 90 tablet 3   levothyroxine (SYNTHROID) 88 MCG tablet Take 88 mcg by mouth daily before breakfast.     lisinopril (ZESTRIL) 10 MG tablet Take 10 mg by mouth in the morning.     trospium (SANCTURA) 20 MG tablet Take 1 tablet (  20 mg total) by mouth 2 (two) times daily. 60 tablet 11   Trospium Chloride 60 MG CP24 TAKE 1 CAPSULE BY MOUTH EVERY DAY 90 capsule 3   zolpidem (AMBIEN) 5 MG tablet Take 2.5 tablets (12.5 mg total) by mouth at bedtime as needed for sleep. 30 tablet 0   calcium elemental as carbonate (TUMS ULTRA 1000) 400 MG chewable tablet Chew 2 tablets by mouth 3 (three) times daily as needed for heartburn. (Patient not taking: Reported on 10/26/2021)     GEMTESA 75 MG TABS TAKE 1 TABLET BY MOUTH AT 2PM (Patient not taking: Reported on 10/26/2021) 90 tablet 3   No current facility-administered medications for this visit.    Past Medical History:  Diagnosis Date   Arthritis    R hand    Breast cancer ([facility])     Breast disorder    cancer   Cancer ([facility])    breast CA- diagnosed with needle biopsy   Depression    GERD (gastroesophageal reflux disease)    rare use of tums    High cholesterol 10/17/2015   History of radiation therapy 03/27/17- 04/23/17   Left Breast, 2.67 Gy in 15 fractions for a total dose of 40.05 Gy. Boost, 2 Gy in 5 fractions for a total dose of 10 Gy.    Hypertension    Hypothyroidism    Pre-diabetes    Hgba1C- in the 6 's , per pt., she monitors her diet    Past Surgical History:  Procedure Laterality Date   BACK SURGERY  1973   BREAST LUMPECTOMY Left 02/2017   BREAST LUMPECTOMY WITH RADIOACTIVE SEED AND SENTINEL LYMPH NODE BIOPSY Left 02/27/2017   Procedure: LEFT BREAST LUMPECTOMY WITH RADIOACTIVE SEED AND LEFT AXILLARY SENTINEL LYMPH NODE BIOPSY;  Surgeon: Donnie Mesa, MD;  Location: [facility];  Service: General;  Laterality: Left;   COLONOSCOPY     X 2   COLONOSCOPY N/A 02/22/2016   Procedure: COLONOSCOPY;  Surgeon: Rogene Houston, MD;  [facility];  Service: Endoscopy;  Laterality: N/A;  1:45   EYE SURGERY Bilateral    phlebpheroplasty   Left shoultder surgery     for bone spurs & frozen shoulder    REVERSE SHOULDER ARTHROPLASTY Left 09/14/2020   Procedure: REVERSE SHOULDER ARTHROPLASTY;  Surgeon: Hiram Gash, MD;  [facility];  Service: Orthopedics;  Laterality: Left;   Right bunionectomy     TONSILLECTOMY     TUBAL LIGATION      Family History  Problem Relation Age of Onset   Colon cancer Neg Hx     Allergies as of 10/26/2021 - Review Complete 10/26/2021  Allergen Reaction Noted   Penicillins Rash     Social History   Socioeconomic History   Marital status: Married    Spouse name: Not on file   Number of children: 2   Years of education: 12   Highest education level: 12th grade  Occupational History   Not on file  Tobacco Use   Smoking status: Never    Passive exposure: Never   Smokeless tobacco: Never  Vaping Use   Vaping  Use: Never used  Substance and Sexual Activity   Alcohol use: Yes    Comment: occasional   Drug use: No   Sexual activity: Not Currently    Partners: Male    Birth control/protection: Post-menopausal  Other Topics Concern   Not on file  Social History Narrative   Not on file   Social  Determinants of Health   Financial Resource Strain: Low Risk  (08/10/2021)   Overall Financial Resource Strain (CARDIA)    Difficulty of Paying Living Expenses: Not hard at all  Food Insecurity: No Food Insecurity (08/10/2021)   Hunger Vital Sign    Worried About Running Out of Food in the Last Year: Never true    Ran Out of Food in the Last Year: Never true  Transportation Needs: No Transportation Needs (08/10/2021)   PRAPARE - Hydrologist (Medical): No    Lack of Transportation (Non-Medical): No  Physical Activity: Inactive (08/10/2021)   Exercise Vital Sign    Days of Exercise per Week: 0 days    Minutes of Exercise per Session: 0 min  Stress: No Stress Concern Present (08/10/2021)   [place]    Feeling of Stress : Only a little  Social Connections: Moderately Integrated (08/10/2021)   Social Connection and Isolation Panel [NHANES]    Frequency of Communication with Friends and Family: More than three times a week    Frequency of Social Gatherings with Friends and Family: More than three times a week    Attends Religious Services: Never    Marine scientist or Organizations: Yes    Attends Music therapist: More than 4 times per year    Marital Status: Married  Human resources officer Violence: Not At Risk (08/10/2021)   Humiliation, Afraid, Rape, and Kick questionnaire    Fear of Current or Ex-Partner: No    Emotionally Abused: No    Physically Abused: No    Sexually Abused: No     Review of Systems   Gen: Denies any fever, chills, fatigue, weight loss, lack of appetite.  CV:  Denies chest pain, heart palpitations, peripheral edema, syncope.  Resp: Denies shortness of breath at rest or with exertion. Denies wheezing or cough.  GI: see HPI GU : Denies urinary burning, urinary frequency, urinary hesitancy MS: Denies joint pain, muscle weakness, cramps, or limitation of movement.  Derm: Denies rash, itching, dry skin Psych: Denies depression, anxiety, memory loss, and confusion Heme: Denies bruising, bleeding, and enlarged lymph nodes.   Physical Exam   BP 123/74 (BP Location: Right Arm, Patient Position: Sitting, Cuff Size: Normal)   Pulse 87   Temp 98 F (36.7 C) (Temporal)   Ht '5\' 2"'$  (1.575 m)   Wt 152 lb 3.2 oz (69 kg)   SpO2 95%   BMI 27.84 kg/m   General:   Alert and oriented. Pleasant and cooperative. Well-nourished and well-developed.  Head:  Normocephalic and atraumatic. Eyes:  Without icterus, sclera clear and conjunctiva pink.  Ears:  Normal auditory acuity. Mouth:  No deformity or lesions, oral mucosa pink.  Lungs:  Clear to auscultation bilaterally. No wheezes, rales, or rhonchi. No distress.  Heart:  S1, S2 present without murmurs appreciated.  Abdomen:  +BS, soft, non-tender and non-distended. No HSM noted. No guarding or rebound. No masses appreciated.  Rectal:  Deferred  Msk:  Symmetrical without gross deformities. Normal posture. Extremities:  Without edema. Neurologic:  Alert and  oriented x4;  grossly normal neurologically. Skin:  Intact without significant lesions or rashes. Psych:  Alert and cooperative. Normal mood and affect.   Assessment   Breanna Hill is a 81 y.o. female with a history of arthritis, breast cancer, depression, GERD, HTN, HLD hypothyroidism, and prediabetes presenting today with likely constipation with overflow diarrhea.  Constipation: Reports  for the majority of her life she has had controlled diarrhea  (no incontinence) or constipation that fluctuates.  She reports she typically has a bowel movement  twice per day.  Once every few weeks when she does have a bowel movement she will have hard stool that is very painful to pass that will occasionally put her in tears.  Admits to occasional self disimpaction when this occurs.  She does report that she is not good at drinking water which is likely exacerbating her symptoms.  She currently is taking generic brand Colace.  Noted that she remembers being told as a child that she needed her rectum stretched but it was not performed.  She notes that after the hard stool passes she has an explosion of loose stools.  Has never tried an enema or suppository.  Reports she tried to get fiber in her diet and does eat beans several times per week but this also is likely causing some occasional abdominal bloating.  She reports rare abdominal pain and denies any upper GI symptoms.  Her initial complaint of diarrhea is likely in the setting of constipation and having overflow diarrhea.  We discussed continuing to stay active and ensuring she stays hydrated.  Advised Benefiber 2-3 teaspoons daily in 8 ounces of water and may increase to taking an additional stool softener at night.  If this does not relieve her constipation she was advised that she may begin MiraLAX 1 capful every other day and titrate to daily as needed.  She will follow-up in 3 months or sooner if needed and is pleased with plan today.  Hemorrhoids, rectal bleeding: Reports history of hemorrhoid surgery in the past.  She reports she may have some slight rectal bleeding secondary to passing a hard bowel movement.  Occasionally it is painful.  She denies any actual melena or red stools.  Uses over-the-counter hemorrhoid cream when needed.  Symptoms may occur once every couple of weeks.  Rectal exam deferred today.  Advised to continue over-the-counter hemorrhoid cream as needed.   PLAN   Continue stool softeners daily, may increase to 1 tablet at night. Benefiber 2-3 teaspoons in 8 oz of water Increase  water intake 2-3 bottles of water or other liquid per day. May take miralax 17 g every other day if constipation worsens. Daily exercise.  Continue over-the-counter hemorrhoid cream as needed. Follow up in 3 months, or sooner if needed.   Venetia Night, MSN, FNP-BC, AGACNP-BC [facility]  I have reviewed the note and agree with the APP's assessment as described in this progress note  May try Miralax for now, can take daily and uptitrate as needed up to 3 capfuls per day but if not improving may consider other laxatives.  Maylon Peppers, MD Gastroenterology and Hepatology [facility]

## 2021-10-26 ENCOUNTER — Encounter: Payer: Self-pay | Admitting: Gastroenterology

## 2021-10-26 ENCOUNTER — Ambulatory Visit (INDEPENDENT_AMBULATORY_CARE_PROVIDER_SITE_OTHER): Payer: Medicare HMO | Admitting: Gastroenterology

## 2021-10-26 ENCOUNTER — Other Ambulatory Visit (HOSPITAL_COMMUNITY): Payer: Self-pay | Admitting: Internal Medicine

## 2021-10-26 VITALS — BP 123/74 | HR 87 | Temp 98.0°F | Ht 62.0 in | Wt 152.2 lb

## 2021-10-26 DIAGNOSIS — E1122 Type 2 diabetes mellitus with diabetic chronic kidney disease: Secondary | ICD-10-CM | POA: Diagnosis not present

## 2021-10-26 DIAGNOSIS — K640 First degree hemorrhoids: Secondary | ICD-10-CM

## 2021-10-26 DIAGNOSIS — R7309 Other abnormal glucose: Secondary | ICD-10-CM | POA: Diagnosis not present

## 2021-10-26 DIAGNOSIS — K625 Hemorrhage of anus and rectum: Secondary | ICD-10-CM

## 2021-10-26 DIAGNOSIS — R197 Diarrhea, unspecified: Secondary | ICD-10-CM | POA: Diagnosis not present

## 2021-10-26 DIAGNOSIS — Z23 Encounter for immunization: Secondary | ICD-10-CM | POA: Diagnosis not present

## 2021-10-26 DIAGNOSIS — E785 Hyperlipidemia, unspecified: Secondary | ICD-10-CM | POA: Diagnosis not present

## 2021-10-26 DIAGNOSIS — K5900 Constipation, unspecified: Secondary | ICD-10-CM | POA: Diagnosis not present

## 2021-10-26 DIAGNOSIS — N1832 Chronic kidney disease, stage 3b: Secondary | ICD-10-CM | POA: Diagnosis not present

## 2021-10-26 DIAGNOSIS — E039 Hypothyroidism, unspecified: Secondary | ICD-10-CM | POA: Diagnosis not present

## 2021-10-26 DIAGNOSIS — R7989 Other specified abnormal findings of blood chemistry: Secondary | ICD-10-CM

## 2021-10-26 NOTE — Patient Instructions (Addendum)
Continue taking your daily stool softeners. You may continue to take 2 in the morning and if you wish you may take 1 in the evening before bed.   I believe increasing your water intake with goal of 2-3 bottles of water a day or about 48 oz of fluids total is a good starting goal.  I believe this may be worsening your constipation.  I did begin taking Benefiber 2-3 teaspoons daily and at least 8 ounces of water.  After couple weeks someone should increase this to twice daily.  If after a month you do not see a big improvement in reduction of your hard stools then you may begin Miralax 17 g (1 capful) every other day in 8 oz of water.   Continue your weekly exercise, on days that you do not have exercise class please set a goal for at least 20 to 30 minutes of exercise, even if it is only walking.  Continue over-the-counter hemorrhoid cream as needed.  Follow-up in 3 months, or sooner if needed.  If you begin having significant diarrhea or worsening constipation please contact the office.  It was a pleasure to see you today. I want to create trusting relationships with patients. If you receive a survey regarding your visit,  I greatly appreciate you taking time to fill this out on paper or through your MyChart. I value your feedback.  Venetia Night, MSN, FNP-BC, AGACNP-BC [facility]

## 2021-11-03 ENCOUNTER — Ambulatory Visit (HOSPITAL_COMMUNITY)
Admission: RE | Admit: 2021-11-03 | Discharge: 2021-11-03 | Disposition: A | Discharge status: Home / Self Care | Payer: Medicare HMO | Source: Ambulatory Visit | Attending: Internal Medicine | Admitting: Internal Medicine

## 2021-11-03 DIAGNOSIS — R944 Abnormal results of kidney function studies: Secondary | ICD-10-CM | POA: Diagnosis not present

## 2021-11-03 DIAGNOSIS — R7989 Other specified abnormal findings of blood chemistry: Secondary | ICD-10-CM | POA: Diagnosis not present

## 2021-11-05 NOTE — Addendum Note (Signed)
Addended by: Harvel Quale on: 11/05/2021 10:03 PM   Modules accepted: Level of Service

## 2021-11-06 DIAGNOSIS — E119 Type 2 diabetes mellitus without complications: Secondary | ICD-10-CM | POA: Diagnosis not present

## 2021-11-06 DIAGNOSIS — H04123 Dry eye syndrome of bilateral lacrimal glands: Secondary | ICD-10-CM | POA: Diagnosis not present

## 2021-11-06 DIAGNOSIS — H52223 Regular astigmatism, bilateral: Secondary | ICD-10-CM | POA: Diagnosis not present

## 2021-11-06 DIAGNOSIS — Z9889 Other specified postprocedural states: Secondary | ICD-10-CM | POA: Diagnosis not present

## 2021-11-06 DIAGNOSIS — H5213 Myopia, bilateral: Secondary | ICD-10-CM | POA: Diagnosis not present

## 2021-11-06 DIAGNOSIS — H1045 Other chronic allergic conjunctivitis: Secondary | ICD-10-CM | POA: Diagnosis not present

## 2021-11-06 DIAGNOSIS — H524 Presbyopia: Secondary | ICD-10-CM | POA: Diagnosis not present

## 2021-11-06 DIAGNOSIS — H25813 Combined forms of age-related cataract, bilateral: Secondary | ICD-10-CM | POA: Diagnosis not present

## 2021-11-07 ENCOUNTER — Ambulatory Visit
Admission: RE | Admit: 2021-11-07 | Discharge: 2021-11-07 | Disposition: A | Discharge status: Home / Self Care | Payer: Medicare HMO | Source: Ambulatory Visit | Attending: Internal Medicine | Admitting: Internal Medicine

## 2021-11-07 DIAGNOSIS — Z1231 Encounter for screening mammogram for malignant neoplasm of breast: Secondary | ICD-10-CM

## 2021-11-09 ENCOUNTER — Other Ambulatory Visit: Payer: Self-pay | Admitting: Internal Medicine

## 2021-11-09 DIAGNOSIS — R928 Other abnormal and inconclusive findings on diagnostic imaging of breast: Secondary | ICD-10-CM

## 2021-11-20 ENCOUNTER — Other Ambulatory Visit: Payer: Self-pay | Admitting: Internal Medicine

## 2021-11-20 ENCOUNTER — Ambulatory Visit
Admission: RE | Admit: 2021-11-20 | Discharge: 2021-11-20 | Disposition: A | Discharge status: Home / Self Care | Payer: Medicare HMO | Source: Ambulatory Visit | Attending: Internal Medicine | Admitting: Internal Medicine

## 2021-11-20 DIAGNOSIS — N6489 Other specified disorders of breast: Secondary | ICD-10-CM | POA: Diagnosis not present

## 2021-11-20 DIAGNOSIS — R928 Other abnormal and inconclusive findings on diagnostic imaging of breast: Secondary | ICD-10-CM | POA: Diagnosis not present

## 2021-11-20 DIAGNOSIS — N6310 Unspecified lump in the right breast, unspecified quadrant: Secondary | ICD-10-CM

## 2021-11-28 ENCOUNTER — Other Ambulatory Visit: Payer: Medicare HMO

## 2021-11-29 ENCOUNTER — Ambulatory Visit
Admission: RE | Admit: 2021-11-29 | Discharge: 2021-11-29 | Disposition: A | Discharge status: Home / Self Care | Payer: Medicare HMO | Source: Ambulatory Visit | Attending: Internal Medicine | Admitting: Internal Medicine

## 2021-11-29 ENCOUNTER — Other Ambulatory Visit (HOSPITAL_COMMUNITY): Payer: Self-pay | Admitting: Diagnostic Radiology

## 2021-11-29 DIAGNOSIS — R928 Other abnormal and inconclusive findings on diagnostic imaging of breast: Secondary | ICD-10-CM | POA: Diagnosis not present

## 2021-11-29 DIAGNOSIS — N6011 Diffuse cystic mastopathy of right breast: Secondary | ICD-10-CM | POA: Diagnosis not present

## 2021-11-29 DIAGNOSIS — N6310 Unspecified lump in the right breast, unspecified quadrant: Secondary | ICD-10-CM

## 2021-11-29 HISTORY — PX: BREAST BIOPSY: SHX20

## 2021-12-09 ENCOUNTER — Other Ambulatory Visit: Payer: Self-pay | Admitting: Urology

## 2021-12-09 DIAGNOSIS — R350 Frequency of micturition: Secondary | ICD-10-CM

## 2021-12-19 ENCOUNTER — Telehealth: Payer: Self-pay | Admitting: *Deleted

## 2021-12-19 NOTE — Telephone Encounter (Signed)
Pt called and states that she has not had a BM in 4 days. She states she is taking MiraLAX  and Benefiber as recommended. Has not increase fluid intake. Seen some blood when she wiped. Please advise.

## 2021-12-20 NOTE — Telephone Encounter (Signed)
Spoke to pt, she informed me that she would like to continue with the MiraLAX and try to drink more water. She states that Linzess is to strong for her. Informed her to call office with an update.

## 2021-12-20 NOTE — Telephone Encounter (Signed)
Spoke to pt, informed her of recommendations. Pt voiced understanding. Pt states she had a BM this morning after she look a Linzess 142mg that her daughter had given her. She states she feels much better. She states she is taking MiraLAX twice daily and doing the Benefiber. Recommended drinking more water.

## 2021-12-31 DIAGNOSIS — E785 Hyperlipidemia, unspecified: Secondary | ICD-10-CM | POA: Diagnosis not present

## 2021-12-31 DIAGNOSIS — E039 Hypothyroidism, unspecified: Secondary | ICD-10-CM | POA: Diagnosis not present

## 2021-12-31 DIAGNOSIS — E1169 Type 2 diabetes mellitus with other specified complication: Secondary | ICD-10-CM | POA: Diagnosis not present

## 2022-01-09 ENCOUNTER — Ambulatory Visit: Payer: Medicare HMO

## 2022-01-11 ENCOUNTER — Ambulatory Visit: Payer: Medicare HMO

## 2022-01-16 ENCOUNTER — Ambulatory Visit: Payer: Medicare HMO

## 2022-01-24 DIAGNOSIS — M810 Age-related osteoporosis without current pathological fracture: Secondary | ICD-10-CM | POA: Diagnosis not present

## 2022-01-24 DIAGNOSIS — G4700 Insomnia, unspecified: Secondary | ICD-10-CM | POA: Diagnosis not present

## 2022-01-24 DIAGNOSIS — Z79899 Other long term (current) drug therapy: Secondary | ICD-10-CM | POA: Diagnosis not present

## 2022-01-24 DIAGNOSIS — R5383 Other fatigue: Secondary | ICD-10-CM | POA: Diagnosis not present

## 2022-01-24 DIAGNOSIS — E559 Vitamin D deficiency, unspecified: Secondary | ICD-10-CM | POA: Diagnosis not present

## 2022-01-24 DIAGNOSIS — E118 Type 2 diabetes mellitus with unspecified complications: Secondary | ICD-10-CM | POA: Diagnosis not present

## 2022-01-24 DIAGNOSIS — I10 Essential (primary) hypertension: Secondary | ICD-10-CM | POA: Diagnosis not present

## 2022-01-24 NOTE — Progress Notes (Addendum)
GI Office Note    Referring Provider: Asencion Noble, MD Primary Care Physician:  Asencion Noble, MD Primary Gastroenterologist: Harvel Quale, MD  Date:  01/25/2022  ID:  Breanna Hill, DOB 1940/02/06, MRN [id number]   Chief Complaint   Chief Complaint  Patient presents with   Constipation    Follow up on constipation and diarrhea. Patient reports she can deal with diarrhea but the constipation has been an issues. She has to pick out stool at times because she cannot get it to pass. Has tried laxatives and two table spoons of metamucil.     History of Present Illness  Breanna Hill is a 82 y.o. female with a history of arthritis, breast cancer, depression, GERD, HTN, HLD, hypothyroidism, and prediabetes presenting today for follow up.   Last seen 10/26/21 for consultation for diarrhea however suspected to be constipation with overflow. Reported bm tweice per day and if not then has hard stools that are painful which results in some bleeding and looser stools after. This occurs once every couple weeks. Taking stool softeners (2 per day). Not drinking enough water. No upper GI symptoms. Exercises twice per week. Advised to continue colace daily, can take nightly as well. Start fiber supplement, increase water intake and take miralax every other day as needed. (Dr. Jenetta Downer recommended uptitrating miralax up to TID if needed.   Patient tried daughters Linzess and was helpful but reported repeated use was too strong. She continued miralax and increasing water and advised to update regarding progress.   Today: Bowel habits: Having more trouble with constipation since last visit. Took repeated doses of dulcolax and took 3-4 days of laxatives. Believes the fiber is helping as her bowels move a little better than usual. At one point she had a lot of pain due to stools being hard and feels as though her rectum is tight and sometimes has to pick out her stool to get it started. States she  is going everyday for the most part and has some watery stools as well. She is okay with the diarrhea as long as she is not constipated.  Has urgency but able to make it to the bathroom. No abdominal pain. Having gas from fiber supplement.   Hemorrhoids: Doing better. No pain or bleeding.    Current Outpatient Medications  Medication Sig Dispense Refill   atorvastatin (LIPITOR) 20 MG tablet Take 20 mg by mouth in the morning.     Calcium Carbonate-Vitamin D (CALCIUM 600+D PO) Take 1 tablet by mouth in the morning.     Cholecalciferol (VITAMIN D-3) 125 MCG (5000 UT) TABS Take 5,000 Units by mouth in the morning.     GEMTESA 75 MG TABS TAKE 1 TABLET BY MOUTH AT 2PM 90 tablet 3   letrozole (FEMARA) 2.5 MG tablet TAKE 1 TABLET BY MOUTH EVERY DAY 90 tablet 3   levothyroxine (SYNTHROID) 88 MCG tablet Take 88 mcg by mouth daily before breakfast.     lisinopril (ZESTRIL) 10 MG tablet Take 10 mg by mouth in the morning.     trospium (SANCTURA) 20 MG tablet TAKE 1 TABLET BY MOUTH TWICE A DAY 180 tablet 3   zolpidem (AMBIEN) 5 MG tablet Take 2.5 tablets (12.5 mg total) by mouth at bedtime as needed for sleep. 30 tablet 0   Trospium Chloride 60 MG CP24 TAKE 1 CAPSULE BY MOUTH EVERY DAY (Patient not taking: Reported on 01/25/2022) 90 capsule 3   No current facility-administered medications for this visit.  Past Medical History:  Diagnosis Date   Arthritis    R hand    Breast cancer ([place])    Breast disorder    cancer   Cancer ([place])    breast CA- diagnosed with needle biopsy   Depression    GERD (gastroesophageal reflux disease)    rare use of tums    High cholesterol 10/17/2015   History of radiation therapy 03/27/17- 04/23/17   Left Breast, 2.67 Gy in 15 fractions for a total dose of 40.05 Gy. Boost, 2 Gy in 5 fractions for a total dose of 10 Gy.    Hypertension    Hypothyroidism    Pre-diabetes    Hgba1C- in the 6 's , per pt., she monitors her diet    Past Surgical History:  Procedure  Laterality Date   BACK SURGERY  1973   BREAST BIOPSY Left 02/04/2017   BREAST BIOPSY Right 11/29/2021   MM RT BREAST BX W LOC DEV 1ST LESION IMAGE BX SPEC STEREO GUIDE 11/29/2021 [facility]   BREAST LUMPECTOMY Left 02/2017   BREAST LUMPECTOMY WITH RADIOACTIVE SEED AND SENTINEL LYMPH NODE BIOPSY Left 02/27/2017   Procedure: LEFT BREAST LUMPECTOMY WITH RADIOACTIVE SEED AND LEFT AXILLARY SENTINEL LYMPH NODE BIOPSY;  Surgeon: Donnie Mesa, MD;  Location: [facility];  Service: General;  Laterality: Left;   COLONOSCOPY     X 2   COLONOSCOPY N/A 02/22/2016   Procedure: COLONOSCOPY;  Surgeon: Rogene Houston, MD;  [facility];  Service: Endoscopy;  Laterality: N/A;  1:45   EYE SURGERY Bilateral    phlebpheroplasty   Left shoultder surgery     for bone spurs & frozen shoulder    REVERSE SHOULDER ARTHROPLASTY Left 09/14/2020   Procedure: REVERSE SHOULDER ARTHROPLASTY;  Surgeon: Hiram Gash, MD;  [facility];  Service: Orthopedics;  Laterality: Left;   Right bunionectomy     TONSILLECTOMY     TUBAL LIGATION      Family History  Problem Relation Age of Onset   Colon cancer Neg Hx     Allergies as of 01/25/2022 - Review Complete 01/25/2022  Allergen Reaction Noted   Penicillins Rash     Social History   Socioeconomic History   Marital status: Married    Spouse name: Not on file   Number of children: 2   Years of education: 12   Highest education level: 12th grade  Occupational History   Not on file  Tobacco Use   Smoking status: Never    Passive exposure: Never   Smokeless tobacco: Never  Vaping Use   Vaping Use: Never used  Substance and Sexual Activity   Alcohol use: Yes    Comment: occasional   Drug use: No   Sexual activity: Not Currently    Partners: Male    Birth control/protection: Post-menopausal  Other Topics Concern   Not on file  Social History Narrative   Not on file   Social Determinants of Health   Financial Resource Strain: Low  Risk  (08/10/2021)   Overall Financial Resource Strain (CARDIA)    Difficulty of Paying Living Expenses: Not hard at all  Food Insecurity: No Food Insecurity (08/10/2021)   Hunger Vital Sign    Worried About Running Out of Food in the Last Year: Never true    Ran Out of Food in the Last Year: Never true  Transportation Needs: No Transportation Needs (08/10/2021)   PRAPARE - Hydrologist (Medical):  No    Lack of Transportation (Non-Medical): No  Physical Activity: Inactive (08/10/2021)   Exercise Vital Sign    Days of Exercise per Week: 0 days    Minutes of Exercise per Session: 0 min  Stress: No Stress Concern Present (08/10/2021)   [place]    Feeling of Stress : Only a little  Social Connections: Moderately Integrated (08/10/2021)   Social Connection and Isolation Panel [NHANES]    Frequency of Communication with Friends and Family: More than three times a week    Frequency of Social Gatherings with Friends and Family: More than three times a week    Attends Religious Services: Never    Marine scientist or Organizations: Yes    Attends Music therapist: More than 4 times per year    Marital Status: Married     Review of Systems   Gen: Denies fever, chills, anorexia. Denies fatigue, weakness, weight loss.  CV: Denies chest pain, palpitations, syncope, peripheral edema, and claudication. Resp: Denies dyspnea at rest, cough, wheezing, coughing up blood, and pleurisy. GI: See HPI Derm: Denies rash, itching, dry skin Psych: Denies depression, anxiety, memory loss, confusion. No homicidal or suicidal ideation.  Heme: Denies bruising, bleeding, and enlarged lymph nodes.   Physical Exam   BP 110/73 (BP Location: Left Arm, Patient Position: Sitting, Cuff Size: Normal)   Pulse 98   Temp 98.2 F (36.8 C) (Oral)   Ht '5\' 2"'$  (1.575 m)   Wt 143 lb 11.2 oz (65.2 kg)   BMI  26.28 kg/m   General:   Alert and oriented. No distress noted. Pleasant and cooperative.  Head:  Normocephalic and atraumatic. Eyes:  Conjuctiva clear without scleral icterus. Mouth:  Oral mucosa pink and moist. Good dentition. No lesions. Lungs:  Clear to auscultation bilaterally. No wheezes, rales, or rhonchi. No distress.  Heart:  S1, S2 present without murmurs appreciated.  Abdomen:  +BS, soft, non-tender and non-distended. No rebound or guarding. No HSM or masses noted. Rectal: deferred Msk:  Symmetrical without gross deformities. Normal posture. Extremities:  Without edema. Neurologic:  Alert and  oriented x4 Psych:  Alert and cooperative. Normal mood and affect.   Assessment  Breanna Hill is a 82 y.o. female with a history of arthritis, breast cancer, depression, GERD, HTN, HLD, hypothyroidism, and prediabetes presenting today for follow up.   Constipation, diarrhea: Continues to have fluctuating constipation and diarrhea.  She would much rather have looser stools rather than having constipation.  She continues to have periodic times where she will go 3 to 5 days without a bowel movement which is not relieved by over-the-counter laxatives.  She does feel as though daily fiber supplement with Metamucil is helping however she would like to try a prescription to help avoid constipation.  It appears that she may be sensitive to laxatives therefore we will trial Linzess 72 mcg once daily, 30 minutes prior to breakfast.  Advised her to call with a progress report in 2-3 weeks.  She is to continue fiber supplement and advised that if she Linzess is too strong that she should begin MiraLAX 1 capful once to twice daily if needed for constipation.  Advised to avoid self disimpaction.  Hemorrhoids: Stable. Denies any rectal bleeding or pain.  Continues to use over-the-counter hemorrhoid cream when needed.  PLAN   Continue metamucil 2-3 teaspoons once or twice daily.  Trial Linzess 72 mcg  once daily, 30 minutes  prior to breakfast. Samples provided. Call with progress report in 2-3 weeks.  Will send a prescription if helpful. Adequate water intake, 2-3 bottles water daily.  Daily exercise.  Follow up in 3 months, sooner if needed.     Venetia Night, MSN, FNP-BC, AGACNP-BC [facility]  I have reviewed the note and agree with the APP's assessment as described in this progress note  Maylon Peppers, MD Gastroenterology and [facility]

## 2022-01-25 ENCOUNTER — Ambulatory Visit (INDEPENDENT_AMBULATORY_CARE_PROVIDER_SITE_OTHER): Payer: Medicare HMO | Admitting: Gastroenterology

## 2022-01-25 ENCOUNTER — Encounter: Payer: Self-pay | Admitting: Gastroenterology

## 2022-01-25 VITALS — BP 110/73 | HR 98 | Temp 98.2°F | Ht 62.0 in | Wt 143.7 lb

## 2022-01-25 DIAGNOSIS — K640 First degree hemorrhoids: Secondary | ICD-10-CM

## 2022-01-25 DIAGNOSIS — K5900 Constipation, unspecified: Secondary | ICD-10-CM

## 2022-01-25 DIAGNOSIS — R197 Diarrhea, unspecified: Secondary | ICD-10-CM

## 2022-01-25 NOTE — Patient Instructions (Signed)
Continue Metamucil 2-3 teaspoons once or twice daily. Please ensure you are staying hydrated, drinking at least 2-3 bottles of water daily. Continue daily exercise  I am providing you with some samples of Linzess.  You will take 1 tablet (72 mcg) once daily, 30 minutes prior to breakfast.  Please call me with a progress report in 2-3 weeks and let me know if Linzess does helpful and if you would like a prescription or if it is too strong.  If Linzess too strong, you may start taking MiraLAX 1 capful once or twice daily as needed.  How to take Linzess: Once a day every day on empty stomach, at least 30 minutes before your first meal of the day. It is best to keep medications at a stable temperature Medication is best kept in its original bottle with the disket present.  It is a medication that is meant for everyday use and not to be used as needed.   What to expect: Constipation relief is typically felt in about 1 week Relief of abdominal pain, discomfort, and bloating begins in about 1 week with symptoms typically improving over 12 weeks and beyond. Diarrhea is most common side effect and typically begins within the first 2 weeks and can take 3-4 weeks to resolve It would be helpful to begin treatment over the weekend or when you can be closer to a bathroom   You can go to [email or web address] for patient support and sign up for daily medication reminders.    We will see you in 3 months, or sooner if needed.  It was a pleasure to see you today. I want to create trusting relationships with patients. If you receive a survey regarding your visit,  I greatly appreciate you taking time to fill this out on paper or through your MyChart. I value your feedback.  Venetia Night, MSN, FNP-BC, AGACNP-BC [facility]

## 2022-01-29 DIAGNOSIS — M810 Age-related osteoporosis without current pathological fracture: Secondary | ICD-10-CM | POA: Diagnosis not present

## 2022-01-29 DIAGNOSIS — E559 Vitamin D deficiency, unspecified: Secondary | ICD-10-CM | POA: Diagnosis not present

## 2022-01-30 DIAGNOSIS — N1832 Chronic kidney disease, stage 3b: Secondary | ICD-10-CM | POA: Diagnosis not present

## 2022-01-30 DIAGNOSIS — E1122 Type 2 diabetes mellitus with diabetic chronic kidney disease: Secondary | ICD-10-CM | POA: Diagnosis not present

## 2022-01-30 DIAGNOSIS — I10 Essential (primary) hypertension: Secondary | ICD-10-CM | POA: Diagnosis not present

## 2022-01-31 ENCOUNTER — Telehealth: Payer: Self-pay | Admitting: *Deleted

## 2022-01-31 NOTE — Telephone Encounter (Signed)
Pt called and states that Linzess 72 mcg was to strong. She had diarrhea for 3 days. She states she is going to continue taking metamucil and try and drink more water. She will hold off on the [place] for now.

## 2022-01-31 NOTE — Telephone Encounter (Signed)
Pt called and left a message.Tried to call pt back several times. I left a VM to return my phone call.

## 2022-02-01 DIAGNOSIS — M1712 Unilateral primary osteoarthritis, left knee: Secondary | ICD-10-CM | POA: Diagnosis not present

## 2022-02-01 NOTE — Telephone Encounter (Signed)
Noted  

## 2022-02-19 ENCOUNTER — Telehealth: Payer: Self-pay

## 2022-02-19 NOTE — Telephone Encounter (Signed)
Patient called in today for a Rx for Trospium Chloride 20 MG twice a day. Patient is aware that a task will be sent to MD. Patient voiced understanding

## 2022-02-22 NOTE — Telephone Encounter (Signed)
Patient is aware Rx for Trospium Chloride 20 mg has a  whole years worth of refills. Patient voiced understanding

## 2022-03-13 DIAGNOSIS — M1712 Unilateral primary osteoarthritis, left knee: Secondary | ICD-10-CM | POA: Diagnosis not present

## 2022-03-13 NOTE — Progress Notes (Signed)
Surgery orders requested via Epic inbox. °

## 2022-03-14 ENCOUNTER — Ambulatory Visit: Payer: Self-pay | Admitting: Physician Assistant

## 2022-03-14 DIAGNOSIS — G8929 Other chronic pain: Secondary | ICD-10-CM

## 2022-03-14 NOTE — H&P (Signed)
TOTAL KNEE ADMISSION H&P  Patient is being admitted for left total knee arthroplasty.  Subjective:  Chief Complaint:left knee pain.  HPI: Breanna Hill, 82 y.o. female, has a history of pain and functional disability in the left knee due to arthritis and has failed non-surgical conservative treatments for greater than 12 weeks to includeNSAID's and/or analgesics, corticosteriod injections, viscosupplementation injections, flexibility and strengthening excercises, use of assistive devices, and activity modification.  Onset of symptoms was gradual, starting 8 years ago with gradually worsening course since that time. The patient noted no past surgery on the left knee(s).  Patient currently rates pain in the left knee(s) at 8 out of 10 with activity. Patient has night pain, worsening of pain with activity and weight bearing, pain that interferes with activities of daily living, pain with passive range of motion, crepitus, and joint swelling.  Patient has evidence of periarticular osteophytes and joint space narrowing by imaging studies. Bretta Bang is no active infection.  Patient Active Problem List   Diagnosis Date Noted  . Malignant neoplasm of upper-inner quadrant of left breast in female, estrogen receptor positive ([place]) 03/05/2017  . History of colonic polyps 10/31/2015  . High cholesterol 10/17/2015  . SHOULDER, ARTHRITIS, DEGEN./OSTEO 02/28/2009  . CONTRACTURE OF SHOULDER JOINT 02/16/2009  . SHOULDER PAIN 02/16/2009   Past Medical History:  Diagnosis Date  . Arthritis    R hand   . Breast cancer ([place])   . Breast disorder    cancer  . Cancer [facility])    breast CA- diagnosed with needle biopsy  . Depression   . GERD (gastroesophageal reflux disease)    rare use of tums   . High cholesterol 10/17/2015  . History of radiation therapy 03/27/17- 04/23/17   Left Breast, 2.67 Gy in 15 fractions for a total dose of 40.05 Gy. Boost, 2 Gy in 5 fractions for a total dose of 10 Gy.   Marland Kitchen  Hypertension   . Hypothyroidism   . Pre-diabetes    Hgba1C- in the 6 's , per pt., she monitors her diet    Past Surgical History:  Procedure Laterality Date  . [place]  . BREAST BIOPSY Left 02/04/2017  . BREAST BIOPSY Right 11/29/2021   MM RT BREAST BX W LOC DEV 1ST LESION IMAGE BX SPEC STEREO GUIDE 11/29/2021 [facility]  . BREAST LUMPECTOMY Left 02/2017  . BREAST LUMPECTOMY WITH RADIOACTIVE SEED AND SENTINEL LYMPH NODE BIOPSY Left 02/27/2017   Procedure: LEFT BREAST LUMPECTOMY WITH RADIOACTIVE SEED AND LEFT AXILLARY SENTINEL LYMPH NODE BIOPSY;  Surgeon: Donnie Mesa, MD;  Location: [facility];  Service: General;  Laterality: Left;  . COLONOSCOPY     X 2  . COLONOSCOPY N/A 02/22/2016   Procedure: COLONOSCOPY;  Surgeon: Rogene Houston, MD;  [facility];  Service: Endoscopy;  Laterality: N/A;  1:45  . EYE SURGERY Bilateral    phlebpheroplasty  . Left shoultder surgery     for bone spurs & frozen shoulder   . REVERSE SHOULDER ARTHROPLASTY Left 09/14/2020   Procedure: REVERSE SHOULDER ARTHROPLASTY;  Surgeon: Hiram Gash, MD;  [facility];  Service: Orthopedics;  Laterality: Left;  . Right bunionectomy    . TONSILLECTOMY    . TUBAL LIGATION      Current Outpatient Medications  Medication Sig Dispense Refill Last Dose  . atorvastatin (LIPITOR) 20 MG tablet Take 20 mg by mouth in the morning.     . Calcium Carbonate-Vitamin D (CALCIUM 600+D PO)  Take 1 tablet by mouth in the morning.     . Cholecalciferol (VITAMIN D-3) 125 MCG (5000 UT) TABS Take 5,000 Units by mouth in the morning.     Marland Kitchen GEMTESA 75 MG TABS TAKE 1 TABLET BY MOUTH AT 2PM 90 tablet 3   . letrozole (FEMARA) 2.5 MG tablet TAKE 1 TABLET BY MOUTH EVERY DAY 90 tablet 3   . levothyroxine (SYNTHROID) 88 MCG tablet Take 88 mcg by mouth daily before breakfast.     . lisinopril (ZESTRIL) 10 MG tablet Take 10 mg by mouth in the morning.     . trospium (SANCTURA) 20 MG tablet TAKE 1 TABLET BY MOUTH  TWICE A DAY 180 tablet 3   . zolpidem (AMBIEN) 5 MG tablet Take 2.5 tablets (12.5 mg total) by mouth at bedtime as needed for sleep. 30 tablet 0    No current facility-administered medications for this visit.   Allergies  Allergen Reactions  . Penicillins Rash    Has patient had a PCN reaction causing immediate rash, facial/tongue/throat swelling, SOB or lightheadedness with hypotension: No Has patient had a PCN reaction causing severe rash involving mucus membranes or skin necrosis: No Has patient had a PCN reaction that required hospitalization: No Has patient had a PCN reaction occurring within the last 10 years: No If all of the above answers are "NO", then may proceed with Cephalosporin use.     Social History   Tobacco Use  . Smoking status: Never    Passive exposure: Never  . Smokeless tobacco: Never  Substance Use Topics  . Alcohol use: Yes    Comment: occasional    Family History  Problem Relation Age of Onset  . Colon cancer Neg Hx      Review of Systems  Gastrointestinal:  Positive for constipation and diarrhea.  Genitourinary:  Positive for hematuria.  Musculoskeletal:  Positive for arthralgias.  All other systems reviewed and are negative.  Objective:  Physical Exam Constitutional:      General: She is not in acute distress.    Appearance: Normal appearance.  HENT:     Head: Normocephalic and atraumatic.  Eyes:     Extraocular Movements: Extraocular movements intact.     Pupils: Pupils are equal, round, and reactive to light.  Cardiovascular:     Rate and Rhythm: Normal rate and regular rhythm.     Pulses: Normal pulses.     Heart sounds: Normal heart sounds.  Pulmonary:     Effort: Pulmonary effort is normal. No respiratory distress.     Breath sounds: Normal breath sounds. No wheezing.  Abdominal:     General: Abdomen is flat. Bowel sounds are normal. There is no distension.     Palpations: Abdomen is soft.     Tenderness: There is no abdominal  tenderness.  Musculoskeletal:     Cervical back: Normal range of motion and neck supple.     Left knee: Swelling and bony tenderness present. Decreased range of motion. Tenderness present.  Lymphadenopathy:     Cervical: No cervical adenopathy.  Skin:    General: Skin is warm and dry.     Findings: No erythema or rash.  Neurological:     General: No focal deficit present.     Mental Status: She is alert and oriented to person, place, and time.  Psychiatric:        Mood and Affect: Mood normal.        Behavior: Behavior normal.   Vital signs  in last 24 hours: '@VSRANGES'$ @  Labs:   Estimated body mass index is 26.28 kg/m as calculated from the following:   Height as of 01/25/22: '5\' 2"'$  (1.575 m).   Weight as of 01/25/22: 65.2 kg.   Imaging Review Plain radiographs demonstrate moderate degenerative joint disease of the left knee(s). The overall alignment isneutral. The bone quality appears to be good for age and reported activity level.      Assessment/Plan:  End stage arthritis, left knee   The patient history, physical examination, clinical judgment of the provider and imaging studies are consistent with end stage degenerative joint disease of the left knee(s) and total knee arthroplasty is deemed medically necessary. The treatment options including medical management, injection therapy arthroscopy and arthroplasty were discussed at length. The risks and benefits of total knee arthroplasty were presented and reviewed. The risks due to aseptic loosening, infection, stiffness, patella tracking problems, thromboembolic complications and other imponderables were discussed. The patient acknowledged the explanation, agreed to proceed with the plan and consent was signed. Patient is being admitted for inpatient treatment for surgery, pain control, PT, OT, prophylactic antibiotics, VTE prophylaxis, progressive ambulation and ADL's and discharge planning. The patient is planning to be  discharged home with home health services    Anticipated LOS equal to or greater than 2 midnights due to - Age 13 and older with one or more of the following:  - Obesity  - Expected need for hospital services (PT, OT, Nursing) required for safe  discharge  - Anticipated need for postoperative skilled nursing care or inpatient rehab  - Active co-morbidities: Diabetes OR   - Unanticipated findings during/Post Surgery: None  - Patient is a high risk of re-admission due to: None

## 2022-03-14 NOTE — H&P (View-Only) (Signed)
TOTAL KNEE ADMISSION H&P  Patient is being admitted for left total knee arthroplasty.  Subjective:  Chief Complaint:left knee pain.  HPI: Breanna Hill, 82 y.o. female, has a history of pain and functional disability in the left knee due to arthritis and has failed non-surgical conservative treatments for greater than 12 weeks to includeNSAID's and/or analgesics, corticosteriod injections, viscosupplementation injections, flexibility and strengthening excercises, use of assistive devices, and activity modification.  Onset of symptoms was gradual, starting 8 years ago with gradually worsening course since that time. The patient noted no past surgery on the left knee(s).  Patient currently rates pain in the left knee(s) at 8 out of 10 with activity. Patient has night pain, worsening of pain with activity and weight bearing, pain that interferes with activities of daily living, pain with passive range of motion, crepitus, and joint swelling.  Patient has evidence of periarticular osteophytes and joint space narrowing by imaging studies. TThere is no active infection.  Patient Active Problem List   Diagnosis Date Noted  . Malignant neoplasm of upper-inner quadrant of left breast in female, estrogen receptor positive (HCC) 03/05/2017  . History of colonic polyps 10/31/2015  . High cholesterol 10/17/2015  . SHOULDER, ARTHRITIS, DEGEN./OSTEO 02/28/2009  . CONTRACTURE OF SHOULDER JOINT 02/16/2009  . SHOULDER PAIN 02/16/2009   Past Medical History:  Diagnosis Date  . Arthritis    R hand   . Breast cancer (HCC)   . Breast disorder    cancer  . Cancer (HCC)    breast CA- diagnosed with needle biopsy  . Depression   . GERD (gastroesophageal reflux disease)    rare use of tums   . High cholesterol 10/17/2015  . History of radiation therapy 03/27/17- 04/23/17   Left Breast, 2.67 Gy in 15 fractions for a total dose of 40.05 Gy. Boost, 2 Gy in 5 fractions for a total dose of 10 Gy.   .  Hypertension   . Hypothyroidism   . Pre-diabetes    Hgba1C- in the 6 's , per pt., she monitors her diet    Past Surgical History:  Procedure Laterality Date  . BACK SURGERY  1973  . BREAST BIOPSY Left 02/04/2017  . BREAST BIOPSY Right 11/29/2021   MM RT BREAST BX W LOC DEV 1ST LESION IMAGE BX SPEC STEREO GUIDE 11/29/2021 [facility]  . BREAST LUMPECTOMY Left 02/2017  . BREAST LUMPECTOMY WITH RADIOACTIVE SEED AND SENTINEL LYMPH NODE BIOPSY Left 02/27/2017   Procedure: LEFT BREAST LUMPECTOMY WITH RADIOACTIVE SEED AND LEFT AXILLARY SENTINEL LYMPH NODE BIOPSY;  Surgeon: Tsuei, Matthew, MD;  Location: [facility];  Service: General;  Laterality: Left;  . COLONOSCOPY     X 2  . COLONOSCOPY N/A 02/22/2016   Procedure: COLONOSCOPY;  Surgeon: Najeeb U Rehman, MD;  Location: [facility];  Service: Endoscopy;  Laterality: N/A;  1:45  . EYE SURGERY Bilateral    phlebpheroplasty  . Left shoultder surgery     for bone spurs & frozen shoulder   . REVERSE SHOULDER ARTHROPLASTY Left 09/14/2020   Procedure: REVERSE SHOULDER ARTHROPLASTY;  Surgeon: Varkey, Dax T, MD;  Location: [facility];  Service: Orthopedics;  Laterality: Left;  . Right bunionectomy    . TONSILLECTOMY    . TUBAL LIGATION      Current Outpatient Medications  Medication Sig Dispense Refill Last Dose  . atorvastatin (LIPITOR) 20 MG tablet Take 20 mg by mouth in the morning.     . Calcium Carbonate-Vitamin D (CALCIUM 600+D PO)   Take 1 tablet by mouth in the morning.     . Cholecalciferol (VITAMIN D-3) 125 MCG (5000 UT) TABS Take 5,000 Units by mouth in the morning.     . GEMTESA 75 MG TABS TAKE 1 TABLET BY MOUTH AT 2PM 90 tablet 3   . letrozole (FEMARA) 2.5 MG tablet TAKE 1 TABLET BY MOUTH EVERY DAY 90 tablet 3   . levothyroxine (SYNTHROID) 88 MCG tablet Take 88 mcg by mouth daily before breakfast.     . lisinopril (ZESTRIL) 10 MG tablet Take 10 mg by mouth in the morning.     . trospium (SANCTURA) 20 MG tablet TAKE 1 TABLET BY MOUTH  TWICE A DAY 180 tablet 3   . zolpidem (AMBIEN) 5 MG tablet Take 2.5 tablets (12.5 mg total) by mouth at bedtime as needed for sleep. 30 tablet 0    No current facility-administered medications for this visit.   Allergies  Allergen Reactions  . Penicillins Rash    Has patient had a PCN reaction causing immediate rash, facial/tongue/throat swelling, SOB or lightheadedness with hypotension: No Has patient had a PCN reaction causing severe rash involving mucus membranes or skin necrosis: No Has patient had a PCN reaction that required hospitalization: No Has patient had a PCN reaction occurring within the last 10 years: No If all of the above answers are "NO", then may proceed with Cephalosporin use.     Social History   Tobacco Use  . Smoking status: Never    Passive exposure: Never  . Smokeless tobacco: Never  Substance Use Topics  . Alcohol use: Yes    Comment: occasional    Family History  Problem Relation Age of Onset  . Colon cancer Neg Hx      Review of Systems  Gastrointestinal:  Positive for constipation and diarrhea.  Genitourinary:  Positive for hematuria.  Musculoskeletal:  Positive for arthralgias.  All other systems reviewed and are negative.  Objective:  Physical Exam Constitutional:      General: She is not in acute distress.    Appearance: Normal appearance.  HENT:     Head: Normocephalic and atraumatic.  Eyes:     Extraocular Movements: Extraocular movements intact.     Pupils: Pupils are equal, round, and reactive to light.  Cardiovascular:     Rate and Rhythm: Normal rate and regular rhythm.     Pulses: Normal pulses.     Heart sounds: Normal heart sounds.  Pulmonary:     Effort: Pulmonary effort is normal. No respiratory distress.     Breath sounds: Normal breath sounds. No wheezing.  Abdominal:     General: Abdomen is flat. Bowel sounds are normal. There is no distension.     Palpations: Abdomen is soft.     Tenderness: There is no abdominal  tenderness.  Musculoskeletal:     Cervical back: Normal range of motion and neck supple.     Left knee: Swelling and bony tenderness present. Decreased range of motion. Tenderness present.  Lymphadenopathy:     Cervical: No cervical adenopathy.  Skin:    General: Skin is warm and dry.     Findings: No erythema or rash.  Neurological:     General: No focal deficit present.     Mental Status: She is alert and oriented to person, place, and time.  Psychiatric:        Mood and Affect: Mood normal.        Behavior: Behavior normal.   Vital signs   in last 24 hours: @VSRANGES@  Labs:   Estimated body mass index is 26.28 kg/m as calculated from the following:   Height as of 01/25/22: 5' 2" (1.575 m).   Weight as of 01/25/22: 65.2 kg.   Imaging Review Plain radiographs demonstrate moderate degenerative joint disease of the left knee(s). The overall alignment isneutral. The bone quality appears to be good for age and reported activity level.      Assessment/Plan:  End stage arthritis, left knee   The patient history, physical examination, clinical judgment of the provider and imaging studies are consistent with end stage degenerative joint disease of the left knee(s) and total knee arthroplasty is deemed medically necessary. The treatment options including medical management, injection therapy arthroscopy and arthroplasty were discussed at length. The risks and benefits of total knee arthroplasty were presented and reviewed. The risks due to aseptic loosening, infection, stiffness, patella tracking problems, thromboembolic complications and other imponderables were discussed. The patient acknowledged the explanation, agreed to proceed with the plan and consent was signed. Patient is being admitted for inpatient treatment for surgery, pain control, PT, OT, prophylactic antibiotics, VTE prophylaxis, progressive ambulation and ADL's and discharge planning. The patient is planning to be  discharged home with home health services    Anticipated LOS equal to or greater than 2 midnights due to - Age 65 and older with one or more of the following:  - Obesity  - Expected need for hospital services (PT, OT, Nursing) required for safe  discharge  - Anticipated need for postoperative skilled nursing care or inpatient rehab  - Active co-morbidities: Diabetes OR   - Unanticipated findings during/Post Surgery: None  - Patient is a high risk of re-admission due to: None 

## 2022-03-15 NOTE — Progress Notes (Addendum)
Anesthesia Review:  PCP: Asencion Noble- clearance dated 02/02/22 on chart LOV 01/30/22 on chart  Cardiologist : Chest x-ray : EKG : Echo : 2021  Stress test: Cardiac Cath :  Activity level:  Sleep Study/ CPAP : Fasting Blood Sugar :      / Checks Blood Sugar -- times a day:   Blood Thinner/ Instructions /Last Dose: ASA / Instructions/ Last Dose :    Prediabetes- Hgba1c-

## 2022-03-15 NOTE — Patient Instructions (Addendum)
SURGICAL WAITING ROOM VISITATION  Patients having surgery or a procedure may have no more than 2 support people in the waiting area - these visitors may rotate.    Children under the age of 31 must have an adult with them who is not the patient.  Due to an increase in RSV and influenza rates and associated hospitalizations, children ages 62 and under may not visit patients in [place].  If the patient needs to stay at the hospital during part of their recovery, the visitor guidelines for inpatient rooms apply. Pre-op nurse will coordinate an appropriate time for 1 support person to accompany patient in pre-op.  This support person may not rotate.    Please refer to the [facility] website for the visitor guidelines for Inpatients (after your surgery is over and you are in a regular room).       Your procedure is scheduled on:  04/02/22    Report to [facility] Main Entrance    Report to admitting at  [place] AM   Call this number if you have problems the morning of surgery [phone]   Do not eat food :After Midnight.   After Midnight you may have the following liquids until _0430_____ AM  DAY OF SURGERY  Water Non-Citrus Juices (without pulp, NO RED-Apple, White grape, White cranberry) Black Coffee (NO MILK/CREAM OR CREAMERS, sugar ok)  Clear Tea (NO MILK/CREAM OR CREAMERS, sugar ok) regular and decaf                             Plain Jell-O (NO RED)                                           Fruit ices (not with fruit pulp, NO RED)                                     Popsicles (NO RED)                                                               Sports drinks like Gatorade (NO RED)              .        The day of surgery:  Drink ONE (1) Pre-Surgery Clear Ensure or G2 at    0430 ( have completed by ) AM the morning of surgery. Drink in one sitting. Do not sip.  This drink was given to you during your hospital  pre-op appointment visit. Nothing else to  drink after completing the  Pre-Surgery Clear Ensure or G2.          If you have questions, please contact your surgeon's office.       Oral Hygiene is also important to reduce your risk of infection.                                    Remember - BRUSH YOUR TEETH THE MORNING  OF SURGERY WITH YOUR REGULAR TOOTHPASTE  DENTURES WILL BE REMOVED PRIOR TO SURGERY PLEASE DO NOT APPLY "Poly grip" OR ADHESIVES!!!   Do NOT smoke after Midnight   Take these medicines the morning of surgery with A SIP OF WATER:   synthroid, sanctura   DO NOT TAKE ANY ORAL DIABETIC MEDICATIONS DAY OF YOUR SURGERY  Bring CPAP mask and tubing day of surgery.                              You may not have any metal on your body including hair pins, jewelry, and body piercing             Do not wear make-up, lotions, powders, perfumes/cologne, or deodorant  Do not wear nail polish including gel and S&S, artificial/acrylic nails, or any other type of covering on natural nails including finger and toenails. If you have artificial nails, gel coating, etc. that needs to be removed by a nail salon please have this removed prior to surgery or surgery may need to be canceled/ delayed if the surgeon/ anesthesia feels like they are unable to be safely monitored.   Do not shave  48 hours prior to surgery.               Men may shave face and neck.   Do not bring valuables to the hospital. [place].   Contacts, glasses, dentures or bridgework may not be worn into surgery.   Bring small overnight bag day of surgery.   DO NOT [place]. PHARMACY WILL DISPENSE MEDICATIONS LISTED ON YOUR MEDICATION LIST TO YOU DURING YOUR ADMISSION [place]!    Patients discharged on the day of surgery will not be allowed to drive home.  Someone NEEDS to stay with you for the first 24 hours after anesthesia.   Special Instructions: Bring a copy of  your healthcare power of attorney and living will documents the day of surgery if you haven't scanned them before.              Please read over the following fact sheets you were given: IF [place] [phone]   If you received a COVID test during your pre-op visit  it is requested that you wear a mask when out in public, stay away from anyone that may not be feeling well and notify your surgeon if you develop symptoms. If you test positive for Covid or have been in contact with anyone that has tested positive in the last 10 days please notify you surgeon.    [place] - Preparing for Surgery Before surgery, you can play an important role.  Because skin is not sterile, your skin needs to be as free of germs as possible.  You can reduce the number of germs on your skin by washing with CHG (chlorahexidine gluconate) soap before surgery.  CHG is an antiseptic cleaner which kills germs and bonds with the skin to continue killing germs even after washing. Please DO NOT use if you have an allergy to CHG or antibacterial soaps.  If your skin becomes reddened/irritated stop using the CHG and inform your nurse when you arrive at Short Stay. Do not shave (including legs and underarms) for at least 48 hours prior  to the first CHG shower.  You may shave your face/neck. Please follow these instructions carefully:  1.  Shower with CHG Soap the night before surgery and the  morning of Surgery.  2.  If you choose to wash your hair, wash your hair first as usual with your  normal  shampoo.  3.  After you shampoo, rinse your hair and body thoroughly to remove the  shampoo.                           4.  Use CHG as you would any other liquid soap.  You can apply chg directly  to the skin and wash                       Gently with a scrungie or clean washcloth.  5.  Apply the CHG Soap to your body ONLY FROM THE NECK DOWN.   Do not use on face/ open                            Wound or open sores. Avoid contact with eyes, ears mouth and genitals (private parts).                       Wash face,  Genitals (private parts) with your normal soap.             6.  Wash thoroughly, paying special attention to the area where your surgery  will be performed.  7.  Thoroughly rinse your body with warm water from the neck down.  8.  DO NOT shower/wash with your normal soap after using and rinsing off  the CHG Soap.                9.  Pat yourself dry with a clean towel.            10.  Wear clean pajamas.            11.  Place clean sheets on your bed the night of your first shower and do not  sleep with pets. Day of Surgery : Do not apply any lotions/deodorants the morning of surgery.  Please wear clean clothes to the hospital/surgery center.  FAILURE TO FOLLOW THESE INSTRUCTIONS MAY RESULT IN THE CANCELLATION OF YOUR SURGERY PATIENT SIGNATURE_________________________________  NURSE SIGNATURE__________________________________  ________________________________________________________________________

## 2022-03-20 ENCOUNTER — Encounter (HOSPITAL_COMMUNITY): Payer: Self-pay

## 2022-03-20 ENCOUNTER — Encounter (HOSPITAL_COMMUNITY)
Admission: RE | Admit: 2022-03-20 | Discharge: 2022-03-20 | Disposition: A | Discharge status: Home / Self Care | Payer: Medicare HMO | Source: Ambulatory Visit | Attending: Orthopedic Surgery | Admitting: Orthopedic Surgery

## 2022-03-20 ENCOUNTER — Other Ambulatory Visit: Payer: Self-pay

## 2022-03-20 VITALS — BP 108/88 | HR 65 | Temp 98.4°F | Resp 16 | Ht 62.5 in | Wt 140.8 lb

## 2022-03-20 DIAGNOSIS — I517 Cardiomegaly: Secondary | ICD-10-CM | POA: Diagnosis not present

## 2022-03-20 DIAGNOSIS — I10 Essential (primary) hypertension: Secondary | ICD-10-CM | POA: Insufficient documentation

## 2022-03-20 DIAGNOSIS — Z853 Personal history of malignant neoplasm of breast: Secondary | ICD-10-CM | POA: Insufficient documentation

## 2022-03-20 DIAGNOSIS — M1712 Unilateral primary osteoarthritis, left knee: Secondary | ICD-10-CM | POA: Diagnosis not present

## 2022-03-20 DIAGNOSIS — R7303 Prediabetes: Secondary | ICD-10-CM | POA: Diagnosis not present

## 2022-03-20 DIAGNOSIS — M25562 Pain in left knee: Secondary | ICD-10-CM | POA: Diagnosis not present

## 2022-03-20 DIAGNOSIS — G8929 Other chronic pain: Secondary | ICD-10-CM | POA: Insufficient documentation

## 2022-03-20 DIAGNOSIS — I447 Left bundle-branch block, unspecified: Secondary | ICD-10-CM | POA: Diagnosis not present

## 2022-03-20 DIAGNOSIS — Z01818 Encounter for other preprocedural examination: Secondary | ICD-10-CM | POA: Diagnosis not present

## 2022-03-20 LAB — COMPREHENSIVE METABOLIC PANEL
ALT: 23 U/L (ref 0–44)
AST: 23 U/L (ref 15–41)
Albumin: 4.2 g/dL (ref 3.5–5.0)
Alkaline Phosphatase: 47 U/L (ref 38–126)
Anion gap: 5 (ref 5–15)
BUN: 27 mg/dL — ABNORMAL HIGH (ref 8–23)
CO2: 25 mmol/L (ref 22–32)
Calcium: 9.2 mg/dL (ref 8.9–10.3)
Chloride: 106 mmol/L (ref 98–111)
Creatinine, Ser: 1.36 mg/dL — ABNORMAL HIGH (ref 0.44–1.00)
GFR, Estimated: 39 mL/min — ABNORMAL LOW (ref >60)
Glucose, Bld: 114 mg/dL — ABNORMAL HIGH (ref 70–99)
Potassium: 4 mmol/L (ref 3.5–5.1)
Sodium: 136 mmol/L (ref 135–145)
Total Bilirubin: 0.6 mg/dL (ref 0.3–1.2)
Total Protein: 7 g/dL (ref 6.5–8.1)

## 2022-03-20 LAB — CBC WITH DIFFERENTIAL/PLATELET
Abs Immature Granulocytes: 0.01 10*3/uL (ref 0.00–0.07)
Basophils Absolute: 0.1 10*3/uL (ref 0.0–0.1)
Basophils Relative: 1 %
Eosinophils Absolute: 0.1 10*3/uL (ref 0.0–0.5)
Eosinophils Relative: 2 %
HCT: 37.4 % (ref 36.0–46.0)
Hemoglobin: 12.7 g/dL (ref 12.0–15.0)
Immature Granulocytes: 0 %
Lymphocytes Relative: 31 %
Lymphs Abs: 1.7 10*3/uL (ref 0.7–4.0)
MCH: 31.1 pg (ref 26.0–34.0)
MCHC: 34 g/dL (ref 30.0–36.0)
MCV: 91.7 fL (ref 80.0–100.0)
Monocytes Absolute: 0.4 10*3/uL (ref 0.1–1.0)
Monocytes Relative: 8 %
Neutro Abs: 3.2 10*3/uL (ref 1.7–7.7)
Neutrophils Relative %: 58 %
Platelets: 310 10*3/uL (ref 150–400)
RBC: 4.08 MIL/uL (ref 3.87–5.11)
RDW: 12.3 % (ref 11.5–15.5)
WBC: 5.6 10*3/uL (ref 4.0–10.5)
nRBC: 0 % (ref 0.0–0.2)

## 2022-03-20 LAB — TYPE AND SCREEN
ABO/RH(D): O POS
Antibody Screen: NEGATIVE

## 2022-03-20 LAB — SURGICAL PCR SCREEN
MRSA, PCR: NEGATIVE
Staphylococcus aureus: NEGATIVE

## 2022-03-20 LAB — GLUCOSE, CAPILLARY: Glucose-Capillary: 112 mg/dL — ABNORMAL HIGH (ref 70–99)

## 2022-03-21 LAB — HEMOGLOBIN A1C
Hgb A1c MFr Bld: 6.1 % — ABNORMAL HIGH (ref 4.8–5.6)
Mean Plasma Glucose: 128 mg/dL

## 2022-03-22 NOTE — Progress Notes (Signed)
Anesthesia Chart Review   Case: [id number] Date/Time: 04/02/22 0715   Procedure: TOTAL KNEE ARTHROPLASTY (Left: Knee)   Anesthesia type: Spinal   Pre-op diagnosis: OA LEFT KNEE   Location: [facility] ROOM 08 / [facility]   Surgeons: Willaim Sheng, MD       DISCUSSION:82 y.o. never smoker with h/o HTN, chronic LBBB, breast cancer, left knee OA scheduled for above procedure 04/02/2022 with Dr. Charlies Constable.   LBBB chronic.    Low risk stress test 03/02/2019.   Echo EF 55-60%, no valvular problems.   Anticipate pt can proceed with planned procedure barring acute status change.   VS: BP 108/88   Pulse 65   Temp 36.9 C (Oral)   Resp 16   Ht 5' 2.5" (1.588 m)   Wt 63.9 kg   SpO2 100%   BMI 25.34 kg/m   PROVIDERS: Asencion Noble, MD is PCP    LABS: Labs reviewed: Acceptable for surgery. (all labs ordered are listed, but only abnormal results are displayed)  Labs Reviewed  COMPREHENSIVE METABOLIC PANEL - Abnormal; Notable for the following components:      Result Value   Glucose, Bld 114 (*)    BUN 27 (*)    Creatinine, Ser 1.36 (*)    GFR, Estimated 39 (*)    All other components within normal limits  HEMOGLOBIN A1C - Abnormal; Notable for the following components:   Hgb A1c MFr Bld 6.1 (*)    All other components within normal limits  GLUCOSE, CAPILLARY - Abnormal; Notable for the following components:   Glucose-Capillary 112 (*)    All other components within normal limits  SURGICAL PCR SCREEN  CBC WITH DIFFERENTIAL/PLATELET  TYPE AND SCREEN     IMAGES:   EKG:   CV: Echo 03/18/2019 1. Left ventricular ejection fraction, by estimation, is 55 to 60%. The  left ventricle has normal function. The left ventricle has no regional  wall motion abnormalities. There is mild left ventricular hypertrophy.  Left ventricular diastolic parameters  are consistent with Grade I diastolic dysfunction (impaired relaxation).  Elevated left atrial pressure.   2. Right ventricular  systolic function is normal. The right ventricular  size is normal. There is normal pulmonary artery systolic pressure.   3. Left atrial size was moderately dilated.   4. Right atrial size was mildly dilated.   5. The mitral valve is normal in structure. Trivial mitral valve  regurgitation. No evidence of mitral stenosis.   6. The aortic valve is tricuspid. Aortic valve regurgitation is not  visualized. No aortic stenosis is present.  Past Medical History:  Diagnosis Date   Arthritis    R hand    Breast cancer ([place])    left   Breast disorder    cancer   Cancer ([place])    breast CA- diagnosed with needle biopsy   Depression    GERD (gastroesophageal reflux disease)    rare use of tums    High cholesterol 10/17/2015   History of radiation therapy 03/27/17- 04/23/17   Left Breast, 2.67 Gy in 15 fractions for a total dose of 40.05 Gy. Boost, 2 Gy in 5 fractions for a total dose of 10 Gy.    Hypertension    Hypothyroidism    Pre-diabetes    Hgba1C- in the 6 's , per pt., she monitors her diet    Past Surgical History:  Procedure Laterality Date   [place] Left 02/04/2017  BREAST BIOPSY Right 11/29/2021   MM RT BREAST BX W LOC DEV 1ST LESION IMAGE BX SPEC STEREO GUIDE 11/29/2021 [facility]   BREAST LUMPECTOMY Left 02/2017   BREAST LUMPECTOMY WITH RADIOACTIVE SEED AND SENTINEL LYMPH NODE BIOPSY Left 02/27/2017   Procedure: LEFT BREAST LUMPECTOMY WITH RADIOACTIVE SEED AND LEFT AXILLARY SENTINEL LYMPH NODE BIOPSY;  Surgeon: Donnie Mesa, MD;  Location: [place];  Service: General;  Laterality: Left;   COLONOSCOPY     X 2   COLONOSCOPY N/A 02/22/2016   Procedure: COLONOSCOPY;  Surgeon: Rogene Houston, MD;  [facility];  Service: Endoscopy;  Laterality: N/A;  1:45   EYE SURGERY Bilateral    phlebpheroplasty   Left shoultder surgery     for bone spurs & frozen shoulder    REVERSE SHOULDER ARTHROPLASTY Left 09/14/2020   Procedure: REVERSE  SHOULDER ARTHROPLASTY;  Surgeon: Hiram Gash, MD;  Location: [facility];  Service: Orthopedics;  Laterality: Left;   Right bunionectomy     TONSILLECTOMY     TUBAL LIGATION      MEDICATIONS:  acetaminophen (TYLENOL) 650 MG CR tablet   Apoaequorin (PREVAGEN) 10 MG CAPS   aspirin EC 81 MG tablet   atorvastatin (LIPITOR) 20 MG tablet   Calcium Carbonate-Vitamin D (CALCIUM 600+D PO)   Cholecalciferol (VITAMIN D-3) 125 MCG (5000 UT) TABS   diclofenac Sodium (VOLTAREN ARTHRITIS PAIN) 1 % GEL   GEMTESA 75 MG TABS   letrozole (FEMARA) 2.5 MG tablet   levothyroxine (SYNTHROID) 88 MCG tablet   mirtazapine (REMERON) 15 MG tablet   olmesartan (BENICAR) 20 MG tablet   senna-docusate (SENOKOT-S) 8.6-50 MG tablet   trospium (SANCTURA) 20 MG tablet   zolpidem (AMBIEN) 5 MG tablet   No current facility-administered medications for this encounter.      Konrad Felix Ward, PA-C [facility]Surgical Testing [phone]

## 2022-03-28 NOTE — Progress Notes (Signed)
PT called on 03/28/22 and stated per MD instructions she had stopped all supplements that MD wanted her to stop and one of those was Colace- stool softener and now she is constipated.  PT asked what can she take.?  PT instructed to call surgeon office in regards to this.  PT voiced understanding and stated she would call surgeon office.

## 2022-03-28 NOTE — Care Plan (Signed)
Ortho Bundle Case Management Note  Patient Details  Name: Breanna Hill MRN: [id number] Date of Birth: 1940/03/27  patient met with PA in the office for H&P. she will discharge to home with friends to assist. she does live alone. HHPT referral to [place]. OPPT referral to [facility]. has a RW at home. discharge instructions given. CM spoke with her today and answered questions. Patient and MD in agreement with plan. CHoice offered                     DME Arranged:    DME Agency:     HH Arranged:  PT HH Agency:  [facility]  Additional Comments: Please contact me with any questions of if this plan should need to change.  Ladell Heads,  [place] [facility] Specialist  [phone] 03/28/2022, 2:15 PM

## 2022-04-02 ENCOUNTER — Other Ambulatory Visit: Payer: Self-pay

## 2022-04-02 ENCOUNTER — Ambulatory Visit (HOSPITAL_BASED_OUTPATIENT_CLINIC_OR_DEPARTMENT_OTHER): Payer: Medicare HMO | Admitting: Anesthesiology

## 2022-04-02 ENCOUNTER — Encounter (HOSPITAL_COMMUNITY)
Admission: RE | Disposition: A | Discharge status: Home Health | Payer: Self-pay | Source: Ambulatory Visit | Attending: Orthopedic Surgery

## 2022-04-02 ENCOUNTER — Observation Stay (HOSPITAL_COMMUNITY)
Admission: RE | Admit: 2022-04-02 | Discharge: 2022-04-03 | Disposition: A | Discharge status: Home Health | Payer: Medicare HMO | Source: Ambulatory Visit | Attending: Orthopedic Surgery | Admitting: Orthopedic Surgery

## 2022-04-02 ENCOUNTER — Encounter (HOSPITAL_COMMUNITY): Payer: Self-pay | Admitting: Orthopedic Surgery

## 2022-04-02 ENCOUNTER — Ambulatory Visit (HOSPITAL_COMMUNITY): Payer: Medicare HMO | Admitting: Physician Assistant

## 2022-04-02 ENCOUNTER — Observation Stay (HOSPITAL_COMMUNITY): Payer: Medicare HMO

## 2022-04-02 DIAGNOSIS — Z01818 Encounter for other preprocedural examination: Secondary | ICD-10-CM

## 2022-04-02 DIAGNOSIS — M1712 Unilateral primary osteoarthritis, left knee: Secondary | ICD-10-CM | POA: Diagnosis not present

## 2022-04-02 DIAGNOSIS — Z96612 Presence of left artificial shoulder joint: Secondary | ICD-10-CM | POA: Insufficient documentation

## 2022-04-02 DIAGNOSIS — E039 Hypothyroidism, unspecified: Secondary | ICD-10-CM | POA: Diagnosis not present

## 2022-04-02 DIAGNOSIS — Z853 Personal history of malignant neoplasm of breast: Secondary | ICD-10-CM | POA: Insufficient documentation

## 2022-04-02 DIAGNOSIS — G8918 Other acute postprocedural pain: Secondary | ICD-10-CM | POA: Diagnosis not present

## 2022-04-02 DIAGNOSIS — Z471 Aftercare following joint replacement surgery: Secondary | ICD-10-CM | POA: Diagnosis not present

## 2022-04-02 DIAGNOSIS — Z79899 Other long term (current) drug therapy: Secondary | ICD-10-CM | POA: Insufficient documentation

## 2022-04-02 DIAGNOSIS — I10 Essential (primary) hypertension: Secondary | ICD-10-CM | POA: Diagnosis not present

## 2022-04-02 DIAGNOSIS — Z96652 Presence of left artificial knee joint: Secondary | ICD-10-CM | POA: Diagnosis not present

## 2022-04-02 HISTORY — PX: TOTAL KNEE ARTHROPLASTY: SHX125

## 2022-04-02 LAB — GLUCOSE, CAPILLARY: Glucose-Capillary: 110 mg/dL — ABNORMAL HIGH (ref 70–99)

## 2022-04-02 LAB — ABO/RH: ABO/RH(D): O POS

## 2022-04-02 SURGERY — ARTHROPLASTY, KNEE, TOTAL
Anesthesia: Spinal | Site: Knee | Laterality: Left

## 2022-04-02 MED ORDER — DOCUSATE SODIUM 100 MG PO CAPS
100.0000 mg | ORAL_CAPSULE | Freq: Two times a day (BID) | ORAL | Status: DC
  Administered 2022-04-02 – 2022-04-03 (×2): 100 mg via ORAL
  Filled 2022-04-02 (×2): qty 1

## 2022-04-02 MED ORDER — INSULIN ASPART 100 UNIT/ML IJ SOLN: 0.0000 [IU] | Freq: Three times a day (TID) | INTRAMUSCULAR | Status: DC

## 2022-04-02 MED ORDER — BUPIVACAINE LIPOSOME 1.3 % IJ SUSP
INTRAMUSCULAR | Status: DC | PRN
  Administered 2022-04-02: 20 mL

## 2022-04-02 MED ORDER — BUPIVACAINE IN DEXTROSE 0.75-8.25 % IT SOLN
INTRATHECAL | Status: DC | PRN
  Administered 2022-04-02: 1.6 mL via INTRATHECAL

## 2022-04-02 MED ORDER — ONDANSETRON HCL 4 MG/2ML IJ SOLN: 4.0000 mg | Freq: Four times a day (QID) | INTRAMUSCULAR | Status: DC | PRN

## 2022-04-02 MED ORDER — IRBESARTAN 150 MG PO TABS
150.0000 mg | ORAL_TABLET | Freq: Every day | ORAL | Status: DC
  Administered 2022-04-02 – 2022-04-03 (×2): 150 mg via ORAL
  Filled 2022-04-02 (×2): qty 1

## 2022-04-02 MED ORDER — SODIUM CHLORIDE 0.9 % IV SOLN: INTRAVENOUS | Status: DC

## 2022-04-02 MED ORDER — PHENYLEPHRINE HCL-NACL 20-0.9 MG/250ML-% IV SOLN
INTRAVENOUS | Status: DC | PRN
  Administered 2022-04-02: 40 ug/min via INTRAVENOUS

## 2022-04-02 MED ORDER — PROPOFOL 10 MG/ML IV BOLUS
INTRAVENOUS | Status: DC | PRN
  Administered 2022-04-02: 40 mg via INTRAVENOUS

## 2022-04-02 MED ORDER — ISOPROPYL ALCOHOL 70 % SOLN
Status: DC | PRN
  Administered 2022-04-02: 1 via TOPICAL

## 2022-04-02 MED ORDER — ONDANSETRON HCL 4 MG/2ML IJ SOLN
INTRAMUSCULAR | Status: DC | PRN
  Administered 2022-04-02: 4 mg via INTRAVENOUS

## 2022-04-02 MED ORDER — MIRTAZAPINE 15 MG PO TABS
15.0000 mg | ORAL_TABLET | Freq: Every day | ORAL | Status: DC
  Administered 2022-04-02: 15 mg via ORAL
  Filled 2022-04-02: qty 1

## 2022-04-02 MED ORDER — ONDANSETRON HCL 4 MG/2ML IJ SOLN
INTRAMUSCULAR | Status: AC
  Filled 2022-04-02: qty 2

## 2022-04-02 MED ORDER — SODIUM CHLORIDE (PF) 0.9 % IJ SOLN
INTRAMUSCULAR | Status: AC
  Filled 2022-04-02: qty 10

## 2022-04-02 MED ORDER — PROMETHAZINE HCL 25 MG/ML IJ SOLN: 6.2500 mg | INTRAMUSCULAR | Status: DC | PRN

## 2022-04-02 MED ORDER — 0.9 % SODIUM CHLORIDE (POUR BTL) OPTIME
TOPICAL | Status: DC | PRN
  Administered 2022-04-02: 1000 mL

## 2022-04-02 MED ORDER — MIDAZOLAM HCL 5 MG/5ML IJ SOLN
INTRAMUSCULAR | Status: DC | PRN
  Administered 2022-04-02: 1 mg via INTRAVENOUS

## 2022-04-02 MED ORDER — ACETAMINOPHEN 500 MG PO TABS
1000.0000 mg | ORAL_TABLET | Freq: Once | ORAL | Status: AC
  Administered 2022-04-02: 1000 mg via ORAL
  Filled 2022-04-02: qty 2

## 2022-04-02 MED ORDER — LACTATED RINGERS IV SOLN: INTRAVENOUS | Status: DC

## 2022-04-02 MED ORDER — ROPIVACAINE HCL 5 MG/ML IJ SOLN
INTRAMUSCULAR | Status: DC | PRN
  Administered 2022-04-02: 30 mL via PERINEURAL

## 2022-04-02 MED ORDER — DEXAMETHASONE SODIUM PHOSPHATE 4 MG/ML IJ SOLN
INTRAMUSCULAR | Status: DC | PRN
  Administered 2022-04-02: 5 mg via INTRAVENOUS

## 2022-04-02 MED ORDER — FENTANYL CITRATE (PF) 100 MCG/2ML IJ SOLN
INTRAMUSCULAR | Status: AC
  Filled 2022-04-02: qty 2

## 2022-04-02 MED ORDER — EPHEDRINE SULFATE (PRESSORS) 50 MG/ML IJ SOLN
INTRAMUSCULAR | Status: DC | PRN
  Administered 2022-04-02: 10 mg via INTRAVENOUS

## 2022-04-02 MED ORDER — DEXAMETHASONE SODIUM PHOSPHATE 10 MG/ML IJ SOLN
INTRAMUSCULAR | Status: AC
  Filled 2022-04-02: qty 1

## 2022-04-02 MED ORDER — POLYETHYLENE GLYCOL 3350 17 G PO PACK: 17.0000 g | PACK | Freq: Every day | ORAL | Status: DC | PRN

## 2022-04-02 MED ORDER — LIDOCAINE HCL (CARDIAC) PF 100 MG/5ML IV SOSY
PREFILLED_SYRINGE | INTRAVENOUS | Status: DC | PRN
  Administered 2022-04-02: 60 mg via INTRAVENOUS

## 2022-04-02 MED ORDER — ACETAMINOPHEN 10 MG/ML IV SOLN: 1000.0000 mg | Freq: Once | INTRAVENOUS | Status: DC | PRN

## 2022-04-02 MED ORDER — CEFAZOLIN SODIUM-DEXTROSE 2-4 GM/100ML-% IV SOLN
2.0000 g | Freq: Four times a day (QID) | INTRAVENOUS | Status: AC
  Administered 2022-04-02 (×2): 2 g via INTRAVENOUS
  Filled 2022-04-02 (×2): qty 100

## 2022-04-02 MED ORDER — MIRABEGRON ER 25 MG PO TB24
25.0000 mg | ORAL_TABLET | Freq: Every day | ORAL | Status: DC
  Administered 2022-04-02 – 2022-04-03 (×2): 25 mg via ORAL
  Filled 2022-04-02 (×2): qty 1

## 2022-04-02 MED ORDER — BUPIVACAINE LIPOSOME 1.3 % IJ SUSP
INTRAMUSCULAR | Status: AC
  Filled 2022-04-02: qty 20

## 2022-04-02 MED ORDER — LEVOTHYROXINE SODIUM 88 MCG PO TABS
88.0000 ug | ORAL_TABLET | Freq: Every day | ORAL | Status: DC
  Administered 2022-04-03: 88 ug via ORAL
  Filled 2022-04-02: qty 1

## 2022-04-02 MED ORDER — MENTHOL 3 MG MT LOZG: 1.0000 | LOZENGE | OROMUCOSAL | Status: DC | PRN

## 2022-04-02 MED ORDER — OXYCODONE HCL 5 MG PO TABS
5.0000 mg | ORAL_TABLET | ORAL | Status: DC | PRN
  Administered 2022-04-02: 10 mg via ORAL
  Administered 2022-04-02 (×2): 5 mg via ORAL
  Administered 2022-04-03 (×3): 10 mg via ORAL
  Filled 2022-04-02: qty 1
  Filled 2022-04-02 (×2): qty 2
  Filled 2022-04-02: qty 1
  Filled 2022-04-02 (×2): qty 2

## 2022-04-02 MED ORDER — PHENYLEPHRINE HCL (PRESSORS) 10 MG/ML IV SOLN
INTRAVENOUS | Status: DC | PRN
  Administered 2022-04-02: 80 ug via INTRAVENOUS

## 2022-04-02 MED ORDER — ACETAMINOPHEN 160 MG/5ML PO SOLN: 325.0000 mg | Freq: Once | ORAL | Status: DC | PRN

## 2022-04-02 MED ORDER — DIPHENHYDRAMINE HCL 12.5 MG/5ML PO ELIX: 12.5000 mg | ORAL_SOLUTION | ORAL | Status: DC | PRN

## 2022-04-02 MED ORDER — SODIUM CHLORIDE 0.9 % IR SOLN
Status: DC | PRN
  Administered 2022-04-02: 3000 mL

## 2022-04-02 MED ORDER — METHOCARBAMOL 500 MG PO TABS
500.0000 mg | ORAL_TABLET | Freq: Four times a day (QID) | ORAL | Status: DC | PRN
  Administered 2022-04-02 – 2022-04-03 (×2): 500 mg via ORAL
  Filled 2022-04-02 (×2): qty 1

## 2022-04-02 MED ORDER — ISOPROPYL ALCOHOL 70 % SOLN
Status: AC
  Filled 2022-04-02: qty 480

## 2022-04-02 MED ORDER — ONDANSETRON HCL 4 MG PO TABS: 4.0000 mg | ORAL_TABLET | Freq: Four times a day (QID) | ORAL | Status: DC | PRN

## 2022-04-02 MED ORDER — PHENYLEPHRINE HCL-NACL 20-0.9 MG/250ML-% IV SOLN
INTRAVENOUS | Status: AC
  Filled 2022-04-02: qty 250

## 2022-04-02 MED ORDER — POVIDONE-IODINE 10 % EX SWAB
2.0000 | Freq: Once | CUTANEOUS | Status: AC
  Administered 2022-04-02: 2 via TOPICAL

## 2022-04-02 MED ORDER — HYDROMORPHONE HCL 1 MG/ML IJ SOLN
0.5000 mg | INTRAMUSCULAR | Status: DC | PRN
  Administered 2022-04-02: 0.5 mg via INTRAVENOUS
  Administered 2022-04-03: 1 mg via INTRAVENOUS
  Filled 2022-04-02 (×2): qty 1

## 2022-04-02 MED ORDER — BUPIVACAINE LIPOSOME 1.3 % IJ SUSP: 20.0000 mL | Freq: Once | INTRAMUSCULAR | Status: DC

## 2022-04-02 MED ORDER — CHLORHEXIDINE GLUCONATE 0.12 % MT SOLN
15.0000 mL | Freq: Once | OROMUCOSAL | Status: AC
  Administered 2022-04-02: 15 mL via OROMUCOSAL

## 2022-04-02 MED ORDER — PANTOPRAZOLE SODIUM 40 MG PO TBEC
40.0000 mg | DELAYED_RELEASE_TABLET | Freq: Every day | ORAL | Status: DC
  Administered 2022-04-02 – 2022-04-03 (×2): 40 mg via ORAL
  Filled 2022-04-02: qty 1

## 2022-04-02 MED ORDER — ACETAMINOPHEN 325 MG PO TABS: 325.0000 mg | ORAL_TABLET | Freq: Four times a day (QID) | ORAL | Status: DC | PRN

## 2022-04-02 MED ORDER — CEFAZOLIN SODIUM-DEXTROSE 2-4 GM/100ML-% IV SOLN
2.0000 g | INTRAVENOUS | Status: AC
  Administered 2022-04-02: 2 g via INTRAVENOUS
  Filled 2022-04-02: qty 100

## 2022-04-02 MED ORDER — SODIUM CHLORIDE (PF) 0.9 % IJ SOLN
INTRAMUSCULAR | Status: AC
  Filled 2022-04-02: qty 50

## 2022-04-02 MED ORDER — ACETAMINOPHEN 325 MG PO TABS: 325.0000 mg | ORAL_TABLET | Freq: Once | ORAL | Status: DC | PRN

## 2022-04-02 MED ORDER — MIDAZOLAM HCL 2 MG/2ML IJ SOLN
INTRAMUSCULAR | Status: AC
  Filled 2022-04-02: qty 2

## 2022-04-02 MED ORDER — TRANEXAMIC ACID-NACL 1000-0.7 MG/100ML-% IV SOLN
1000.0000 mg | INTRAVENOUS | Status: AC
  Administered 2022-04-02: 1000 mg via INTRAVENOUS
  Filled 2022-04-02: qty 100

## 2022-04-02 MED ORDER — AMISULPRIDE (ANTIEMETIC) 5 MG/2ML IV SOLN: 10.0000 mg | Freq: Once | INTRAVENOUS | Status: DC | PRN

## 2022-04-02 MED ORDER — PHENOL 1.4 % MT LIQD: 1.0000 | OROMUCOSAL | Status: DC | PRN

## 2022-04-02 MED ORDER — ORAL CARE MOUTH RINSE: 15.0000 mL | Freq: Once | OROMUCOSAL | Status: AC

## 2022-04-02 MED ORDER — ACETAMINOPHEN 500 MG PO TABS
1000.0000 mg | ORAL_TABLET | Freq: Four times a day (QID) | ORAL | Status: AC
  Administered 2022-04-02 – 2022-04-03 (×4): 1000 mg via ORAL
  Filled 2022-04-02 (×4): qty 2

## 2022-04-02 MED ORDER — ASPIRIN 81 MG PO CHEW
81.0000 mg | CHEWABLE_TABLET | Freq: Two times a day (BID) | ORAL | Status: DC
  Administered 2022-04-02 – 2022-04-03 (×2): 81 mg via ORAL
  Filled 2022-04-02 (×2): qty 1

## 2022-04-02 MED ORDER — LIDOCAINE HCL (PF) 2 % IJ SOLN
INTRAMUSCULAR | Status: AC
  Filled 2022-04-02: qty 5

## 2022-04-02 MED ORDER — ATORVASTATIN CALCIUM 20 MG PO TABS
20.0000 mg | ORAL_TABLET | Freq: Every day | ORAL | Status: DC
  Administered 2022-04-03: 20 mg via ORAL
  Filled 2022-04-02: qty 1

## 2022-04-02 MED ORDER — HYDROMORPHONE HCL 1 MG/ML IJ SOLN: 0.2500 mg | INTRAMUSCULAR | Status: DC | PRN

## 2022-04-02 MED ORDER — FENTANYL CITRATE (PF) 100 MCG/2ML IJ SOLN
INTRAMUSCULAR | Status: DC | PRN
  Administered 2022-04-02: 50 ug via INTRAVENOUS
  Administered 2022-04-02: 25 ug via INTRAVENOUS

## 2022-04-02 MED ORDER — SODIUM CHLORIDE 0.9% FLUSH
INTRAVENOUS | Status: DC | PRN
  Administered 2022-04-02: 60 mL

## 2022-04-02 MED ORDER — EPHEDRINE 5 MG/ML INJ
INTRAVENOUS | Status: AC
  Filled 2022-04-02: qty 5

## 2022-04-02 MED ORDER — METHOCARBAMOL 1000 MG/10ML IJ SOLN: 500.0000 mg | Freq: Four times a day (QID) | INTRAVENOUS | Status: DC | PRN

## 2022-04-02 MED ORDER — WATER FOR IRRIGATION, STERILE IR SOLN
Status: DC | PRN
  Administered 2022-04-02: 2000 mL

## 2022-04-02 MED ORDER — PROPOFOL 500 MG/50ML IV EMUL
INTRAVENOUS | Status: DC | PRN
  Administered 2022-04-02: 70 ug/kg/min via INTRAVENOUS

## 2022-04-02 SURGICAL SUPPLY — 65 items
ADH SKN CLS APL DERMABOND .7 (GAUZE/BANDAGES/DRESSINGS) ×2
APL PRP STRL LF DISP 70% ISPRP (MISCELLANEOUS) ×2
AUG MED ASF PS 4-5 C-D 10 (Joint) ×1 IMPLANT
AUGMENT MED ASF PS 4-5 C-D 10 (Joint) IMPLANT
BAG COUNTER SPONGE SURGICOUNT (BAG) IMPLANT
BAG SPNG CNTER NS LX DISP (BAG)
BLADE SAG 18X100X1.27 (BLADE) ×1 IMPLANT
BLADE SAW SAG 35X64 .89 (BLADE) ×1 IMPLANT
BNDG CMPR 5X3 CHSV STRCH STRL (GAUZE/BANDAGES/DRESSINGS) ×1
BNDG CMPR 5X62 HK CLSR LF (GAUZE/BANDAGES/DRESSINGS) ×1
BNDG COHESIVE 3X5 TAN ST LF (GAUZE/BANDAGES/DRESSINGS) ×1 IMPLANT
BNDG ELASTIC 6INX 5YD STR LF (GAUZE/BANDAGES/DRESSINGS) IMPLANT
BOWL SMART MIX CTS (DISPOSABLE) ×1 IMPLANT
BSPLAT TIB 5D C CMNT STM LT (Knees) ×1 IMPLANT
CEMENT BONE R 1X40 (Cement) IMPLANT
CEMENT BONE REFOBACIN R1X40 US (Cement) IMPLANT
CHLORAPREP W/TINT 26 (MISCELLANEOUS) ×2 IMPLANT
COMP FEM CMT PS STD 5 LT (Joint) ×1 IMPLANT
COMP PATELLAR 29 STD 8 THK (Orthopedic Implant) IMPLANT
COMPONENT FEM CMT PS STD 5 LT (Joint) IMPLANT
COVER SURGICAL LIGHT HANDLE (MISCELLANEOUS) ×1 IMPLANT
CUFF TOURN SGL QUICK 34 (TOURNIQUET CUFF) ×1
CUFF TRNQT CYL 34X4.125X (TOURNIQUET CUFF) ×1 IMPLANT
DERMABOND ADVANCED .7 DNX12 (GAUZE/BANDAGES/DRESSINGS) ×1 IMPLANT
DRAPE INCISE IOBAN 85X60 (DRAPES) ×1 IMPLANT
DRAPE SHEET LG 3/4 BI-LAMINATE (DRAPES) ×1 IMPLANT
DRAPE U-SHAPE 47X51 STRL (DRAPES) ×1 IMPLANT
DRESSING AQUACEL AG SP 3.5X10 (GAUZE/BANDAGES/DRESSINGS) ×1 IMPLANT
DRSG AQUACEL AG SP 3.5X10 (GAUZE/BANDAGES/DRESSINGS) ×1
ELECT REM PT RETURN 15FT ADLT (MISCELLANEOUS) ×1 IMPLANT
GAUZE SPONGE 4X4 12PLY STRL (GAUZE/BANDAGES/DRESSINGS) ×1 IMPLANT
GLOVE BIO SURGEON STRL SZ 6.5 (GLOVE) ×2 IMPLANT
GLOVE BIOGEL PI IND STRL 6.5 (GLOVE) ×1 IMPLANT
GLOVE BIOGEL PI IND STRL 8 (GLOVE) ×1 IMPLANT
GLOVE SURG ORTHO 8.0 STRL STRW (GLOVE) ×2 IMPLANT
GOWN STRL REUS W/ TWL XL LVL3 (GOWN DISPOSABLE) ×2 IMPLANT
GOWN STRL REUS W/TWL XL LVL3 (GOWN DISPOSABLE) ×2
HANDPIECE INTERPULSE COAX TIP (DISPOSABLE) ×1
HDLS TROCR DRIL PIN KNEE 75 (PIN) ×1
HOLDER FOLEY CATH W/STRAP (MISCELLANEOUS) ×1 IMPLANT
HOOD PEEL AWAY T7 (MISCELLANEOUS) ×3 IMPLANT
KIT TURNOVER KIT A (KITS) IMPLANT
MANIFOLD NEPTUNE II (INSTRUMENTS) ×1 IMPLANT
MARKER SKIN DUAL TIP RULER LAB (MISCELLANEOUS) ×1 IMPLANT
NS IRRIG 1000ML POUR BTL (IV SOLUTION) ×1 IMPLANT
PACK TOTAL KNEE CUSTOM (KITS) ×1 IMPLANT
PATELLA ZIMMER 29MM (Orthopedic Implant) ×1 IMPLANT
PIN DRILL HDLS TROCAR 75 4PK (PIN) IMPLANT
SCREW HEADED 33MM KNEE (MISCELLANEOUS) IMPLANT
SET HNDPC FAN SPRY TIP SCT (DISPOSABLE) ×1 IMPLANT
STEM TIBIA 5 DEG SZ C L KNEE (Knees) IMPLANT
STRIP CLOSURE SKIN 1/2X4 (GAUZE/BANDAGES/DRESSINGS) ×1 IMPLANT
SUT MNCRL AB 3-0 PS2 18 (SUTURE) ×1 IMPLANT
SUT STRATAFIX 0 PDS 27 VIOLET (SUTURE) ×1
SUT STRATAFIX PDO 1 14 VIOLET (SUTURE) ×1
SUT STRATFX PDO 1 14 VIOLET (SUTURE) ×1
SUT VIC AB 2-0 CT2 27 (SUTURE) ×2 IMPLANT
SUTURE STRATFX 0 PDS 27 VIOLET (SUTURE) ×1 IMPLANT
SUTURE STRATFX PDO 1 14 VIOLET (SUTURE) ×1 IMPLANT
SYR 50ML LL SCALE MARK (SYRINGE) ×1 IMPLANT
TIBIA STEM 5 DEG SZ C L KNEE (Knees) ×1 IMPLANT
TRAY FOLEY MTR SLVR 14FR STAT (SET/KITS/TRAYS/PACK) IMPLANT
TUBE SUCTION HIGH CAP CLEAR NV (SUCTIONS) ×1 IMPLANT
UNDERPAD 30X36 HEAVY ABSORB (UNDERPADS AND DIAPERS) ×1 IMPLANT
WRAP KNEE MAXI GEL POST OP (GAUZE/BANDAGES/DRESSINGS) IMPLANT

## 2022-04-02 NOTE — Transfer of Care (Signed)
Immediate Anesthesia Transfer of Care Note  Patient: Breanna Hill  Procedure(s) Performed: TOTAL KNEE ARTHROPLASTY (Left: Knee)  Patient Location: PACU  Anesthesia Type:Spinal  Level of Consciousness: awake, alert , oriented, and patient cooperative  Airway & Oxygen Therapy: Patient Spontanous Breathing and Patient connected to face mask oxygen  Post-op Assessment: Report given to RN and Post -op Vital signs reviewed and stable  Post vital signs: Reviewed and stable  Last Vitals:  Vitals Value Taken Time  BP 111/57 04/02/22 1021  Temp    Pulse 64 04/02/22 1024  Resp 13 04/02/22 1024  SpO2 100 % 04/02/22 1024  Vitals shown include unvalidated device data.  Last Pain:  Vitals:   04/02/22 0558  TempSrc:   PainSc: 0-No pain         Complications: No notable events documented.

## 2022-04-02 NOTE — Anesthesia Postprocedure Evaluation (Signed)
Anesthesia Post Note  Patient: Breanna Hill  Procedure(s) Performed: TOTAL KNEE ARTHROPLASTY (Left: Knee)     Patient location during evaluation: PACU Anesthesia Type: Spinal Level of consciousness: oriented and awake and alert Pain management: pain level controlled Vital Signs Assessment: post-procedure vital signs reviewed and stable Respiratory status: spontaneous breathing, respiratory function stable and patient connected to nasal cannula oxygen Cardiovascular status: blood pressure returned to baseline and stable Postop Assessment: no headache, no backache and no apparent nausea or vomiting Anesthetic complications: no  No notable events documented.  Last Vitals:  Vitals:   04/02/22 1154 04/02/22 1404  BP: (!) 148/71 (!) 142/71  Pulse: 74 75  Resp: 20 17  Temp: 36.4 C 36.7 C  SpO2: 100% 99%    Last Pain:  Vitals:   04/02/22 1543  TempSrc:   PainSc: [name]

## 2022-04-02 NOTE — Anesthesia Procedure Notes (Signed)
Anesthesia Regional Block: Adductor canal block   Pre-Anesthetic Checklist: , timeout performed,  Correct Patient, Correct Site, Correct Laterality,  Correct Procedure, Correct Position, site marked,  Risks and benefits discussed,  Surgical consent,  Pre-op evaluation,  At surgeon's request and post-op pain management  Laterality: Left  Prep: chloraprep       Needles:  Injection technique: Single-shot  Needle Type: Echogenic Stimulator Needle     Needle Length: 9cm  Needle Gauge: 21     Additional Needles:   Procedures:,,,, ultrasound used (permanent image in chart),,    Narrative:  Start time: 04/02/2022 7:00 AM End time: 04/02/2022 7:05 AM Injection made incrementally with aspirations every 5 mL.  Performed by: Personally  Anesthesiologist: Effie Berkshire, MD  Additional Notes: Discussed risks and benefits of the nerve block in detail, including but not limited vascular injury, permanent nerve damage and infection.   Patient tolerated the procedure well. Local anesthetic introduced in an incremental fashion under minimal resistance after negative aspirations. No paresthesias were elicited. After completion of the procedure, no acute issues were identified and patient continued to be monitored by RN.

## 2022-04-02 NOTE — Anesthesia Procedure Notes (Signed)
Spinal  Start time: 04/02/2022 7:38 AM End time: 04/02/2022 7:40 AM Reason for block: surgical anesthesia Staffing Performed: anesthesiologist  Anesthesiologist: Effie Berkshire, MD Performed by: Effie Berkshire, MD Authorized by: Effie Berkshire, MD   Preanesthetic Checklist Completed: patient identified, IV checked, site marked, risks and benefits discussed, surgical consent, monitors and equipment checked, pre-op evaluation and timeout performed Spinal Block Patient position: sitting Prep: DuraPrep and site prepped and draped Location: L3-4 Injection technique: single-shot Needle Needle type: Pencan  Needle gauge: 24 G Needle length: 10 cm Needle insertion depth: 10 cm Additional Notes Patient tolerated well. No immediate complications.  Functioning IV was confirmed and monitors were applied. Sterile prep and drape, including hand hygiene and sterile gloves were used. The patient was positioned and the back was prepped. The skin was anesthetized with lidocaine. Free flow of clear CSF was obtained prior to injecting local anesthetic into the CSF. The spinal needle aspirated freely following injection. The needle was carefully withdrawn. The patient tolerated the procedure well.

## 2022-04-02 NOTE — Interval H&P Note (Signed)
The patient has been re-examined, and the chart reviewed, and there have been no interval changes to the documented history and physical.    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   The risks, benefits, and alternatives have been discussed at length with patient, and the patient is willing to proceed.  Left knee marked. Consent has been signed.  

## 2022-04-02 NOTE — Anesthesia Preprocedure Evaluation (Addendum)
Anesthesia Evaluation  Patient identified by MRN, date of birth, ID band Patient awake    Reviewed: Allergy & Precautions, NPO status , Patient's Chart, lab work & pertinent test results  Airway Mallampati: II  TM Distance: >3 FB Neck ROM: Full    Dental  (+) Teeth Intact, Dental Advisory Given   Pulmonary    breath sounds clear to auscultation       Cardiovascular hypertension, Pt. on medications  Rhythm:Regular Rate:Normal     Neuro/Psych    GI/Hepatic Neg liver ROS,GERD  ,,  Endo/Other  Hypothyroidism    Renal/GU negative Renal ROS     Musculoskeletal  (+) Arthritis ,    Abdominal   Peds  Hematology   Anesthesia Other Findings   Reproductive/Obstetrics                             Anesthesia Physical Anesthesia Plan  ASA: 2  Anesthesia Plan: Spinal   Post-op Pain Management: Regional block*   Induction: Intravenous  PONV Risk Score and Plan: 3 and Ondansetron, Propofol infusion and Treatment may vary due to age or medical condition  Airway Management Planned: Natural Airway and Simple Face Mask  Additional Equipment: None  Intra-op Plan:   Post-operative Plan:   Informed Consent: I have reviewed the patients History and Physical, chart, labs and discussed the procedure including the risks, benefits and alternatives for the proposed anesthesia with the patient or authorized representative who has indicated his/her understanding and acceptance.       Plan Discussed with: CRNA  Anesthesia Plan Comments: (Lab Results      Component                Value               Date                      WBC                      5.6                 03/20/2022                HGB                      12.7                03/20/2022                HCT                      37.4                03/20/2022                MCV                      91.7                03/20/2022                 PLT                      310                 03/20/2022           )  Anesthesia Quick Evaluation  

## 2022-04-02 NOTE — Discharge Instructions (Signed)
INSTRUCTIONS AFTER JOINT REPLACEMENT  ° °Remove items at home which could result in a fall. This includes throw rugs or furniture in walking pathways °ICE to the affected joint every three hours while awake for 30 minutes at a time, for at least the first 3-5 days, and then as needed for pain and swelling.  Continue to use ice for pain and swelling. You may notice swelling that will progress down to the foot and ankle.  This is normal after surgery.  Elevate your leg when you are not up walking on it.   °Continue to use the breathing machine you got in the hospital (incentive spirometer) which will help keep your temperature down.  It is common for your temperature to cycle up and down following surgery, especially at night when you are not up moving around and exerting yourself.  The breathing machine keeps your lungs expanded and your temperature down. ° ° °DIET:  As you were doing prior to hospitalization, we recommend a well-balanced diet. ° °DRESSING / WOUND CARE / SHOWERING ° °Keep the surgical dressing until follow up.  The dressing is water proof, so you can shower without any extra covering.  IF THE DRESSING FALLS OFF or the wound gets wet inside, change the dressing with sterile gauze.  Please use good hand washing techniques before changing the dressing.  Do not use any lotions or creams on the incision until instructed by your surgeon.   ° °ACTIVITY ° °Increase activity slowly as tolerated, but follow the weight bearing instructions below.   °No driving for 6 weeks or until further direction given by your physician.  You cannot drive while taking narcotics.  °No lifting or carrying greater than 10 lbs. until further directed by your surgeon. °Avoid periods of inactivity such as sitting longer than an hour when not asleep. This helps prevent blood clots.  °You may return to work once you are authorized by your doctor.  ° ° ° °WEIGHT BEARING  ° °Weight bearing as tolerated with assist device (walker, cane,  etc) as directed, use it as long as suggested by your surgeon or therapist, typically at least 4-6 weeks. ° ° °EXERCISES ° °Results after joint replacement surgery are often greatly improved when you follow the exercise, range of motion and muscle strengthening exercises prescribed by your doctor. Safety measures are also important to protect the joint from further injury. Any time any of these exercises cause you to have increased pain or swelling, decrease what you are doing until you are comfortable again and then slowly increase them. If you have problems or questions, call your caregiver or physical therapist for advice.  ° °Rehabilitation is important following a joint replacement. After just a few days of immobilization, the muscles of the leg can become weakened and shrink (atrophy).  These exercises are designed to build up the tone and strength of the thigh and leg muscles and to improve motion. Often times heat used for twenty to thirty minutes before working out will loosen up your tissues and help with improving the range of motion but do not use heat for the first two weeks following surgery (sometimes heat can increase post-operative swelling).  ° °These exercises can be done on a training (exercise) mat, on the floor, on a table or on a bed. Use whatever works the best and is most comfortable for you.    Use music or television while you are exercising so that the exercises are a pleasant break in your   day. This will make your life better with the exercises acting as a break in your routine that you can look forward to.   Perform all exercises about fifteen times, three times per day or as directed.  You should exercise both the operative leg and the other leg as well. ° °Exercises include: °  °Quad Sets - Tighten up the muscle on the front of the thigh (Quad) and hold for 5-10 seconds.   °Straight Leg Raises - With your knee straight (if you were given a brace, keep it on), lift the leg to 60  degrees, hold for 3 seconds, and slowly lower the leg.  Perform this exercise against resistance later as your leg gets stronger.  °Leg Slides: Lying on your back, slowly slide your foot toward your buttocks, bending your knee up off the floor (only go as far as is comfortable). Then slowly slide your foot back down until your leg is flat on the floor again.  °Angel Wings: Lying on your back spread your legs to the side as far apart as you can without causing discomfort.  °Hamstring Strength:  Lying on your back, push your heel against the floor with your leg straight by tightening up the muscles of your buttocks.  Repeat, but this time bend your knee to a comfortable angle, and push your heel against the floor.  You may put a pillow under the heel to make it more comfortable if necessary.  ° °A rehabilitation program following joint replacement surgery can speed recovery and prevent re-injury in the future due to weakened muscles. Contact your doctor or a physical therapist for more information on knee rehabilitation.  ° ° °CONSTIPATION ° °Constipation is defined medically as fewer than three stools per week and severe constipation as less than one stool per week.  Even if you have a regular bowel pattern at home, your normal regimen is likely to be disrupted due to multiple reasons following surgery.  Combination of anesthesia, postoperative narcotics, change in appetite and fluid intake all can affect your bowels.  ° °YOU MUST use at least one of the following options; they are listed in order of increasing strength to get the job done.  They are all available over the counter, and you may need to use some, POSSIBLY even all of these options:   ° °Drink plenty of fluids (prune juice may be helpful) and high fiber foods °Colace 100 mg by mouth twice a day  °Senokot for constipation as directed and as needed Dulcolax (bisacodyl), take with full glass of water  °Miralax (polyethylene glycol) once or twice a day as  needed. ° °If you have tried all these things and are unable to have a bowel movement in the first 3-4 days after surgery call either your surgeon or your primary doctor.   ° °If you experience loose stools or diarrhea, hold the medications until you stool forms back up.  If your symptoms do not get better within 1 week or if they get worse, check with your doctor.  If you experience "the worst abdominal pain ever" or develop nausea or vomiting, please contact the office immediately for further recommendations for treatment. ° ° °ITCHING:  If you experience itching with your medications, try taking only a single pain pill, or even half a pain pill at a time.  You can also use Benadryl over the counter for itching or also to help with sleep.  ° °TED HOSE STOCKINGS:  Use stockings on both   legs until for at least 2 weeks or as directed by physician office. They may be removed at night for sleeping. ° °MEDICATIONS:  See your medication summary on the “After Visit Summary” that nursing will review with you.  You may have some home medications which will be placed on hold until you complete the course of blood thinner medication.  It is important for you to complete the blood thinner medication as prescribed. ° ° °Blood clot prevention (DVT Prophylaxis): After surgery you are at an increased risk for a blood clot. you were prescribed a blood thinner, Aspirin 81mg, to be taken twice daily for a total of 4 weeks from surgery to help reduce your risk of getting a blood clot. This will help prevent a blood clot. Signs of a pulmonary embolus (blood clot in the lungs) include sudden short of breath, feeling lightheaded or dizzy, chest pain with a deep breath, rapid pulse rapid breathing. Signs of a blood clot in your arms or legs include new unexplained swelling and cramping, warm, red or darkened skin around the painful area. Please call the office or 911 right away if these signs or symptoms develop. ° °PRECAUTIONS:  If you  experience chest pain or shortness of breath - call 911 immediately for transfer to the hospital emergency department.  ° °If you develop a fever greater that 101 F, purulent drainage from wound, increased redness or drainage from wound, foul odor from the wound/dressing, or calf pain - CONTACT YOUR SURGEON.   °                                                °FOLLOW-UP APPOINTMENTS:  If you do not already have a post-op appointment, please call the office for an appointment to be seen by your surgeon.  Guidelines for how soon to be seen are listed in your “After Visit Summary”, but are typically between 2-3 weeks after surgery. ° °OTHER INSTRUCTIONS:  ° °Knee Replacement:  Do not place pillow under knee, focus on keeping the knee straight while resting. CPM instructions: 0-90 degrees, 2 hours in the morning, 2 hours in the afternoon, and 2 hours in the evening. Place foam block, curve side up under heel at all times except when in CPM or when walking.  DO NOT modify, tear, cut, or change the foam block in any way. ° °POST-OPERATIVE OPIOID TAPER INSTRUCTIONS: °It is important to wean off of your opioid medication as soon as possible. If you do not need pain medication after your surgery it is ok to stop day one. °Opioids include: °Codeine, Hydrocodone(Norco, Vicodin), Oxycodone(Percocet, oxycontin) and hydromorphone amongst others.  °Long term and even short term use of opiods can cause: °Increased pain response °Dependence °Constipation °Depression °Respiratory depression °And more.  °Withdrawal symptoms can include °Flu like symptoms °Nausea, vomiting °And more °Techniques to manage these symptoms °Hydrate well °Eat regular healthy meals °Stay active °Use relaxation techniques(deep breathing, meditating, yoga) °Do Not substitute Alcohol to help with tapering °If you have been on opioids for less than two weeks and do not have pain than it is ok to stop all together.  °Plan to wean off of opioids °This plan should  start within one week post op of your joint replacement. °Maintain the same interval or time between taking each dose and first decrease the dose.  °Cut the   total daily intake of opioids by one tablet each day °Next start to increase the time between doses. °The last dose that should be eliminated is the evening dose.  ° °MAKE SURE YOU:  °Understand these instructions.  °Get help right away if you are not doing well or get worse.  ° ° °Thank you for letting us be a part of your medical care team.  It is a privilege we respect greatly.  We hope these instructions will help you stay on track for a fast and full recovery!  ° ° ° °  °  °

## 2022-04-02 NOTE — Progress Notes (Signed)
Orthopedic Tech Progress Note Patient Details:  Breanna Hill [birth year] [id number]  Ortho Devices Type of Ortho Device: Bone foam zero knee Ortho Device/Splint Interventions: Ordered      [facility] 04/02/2022, 11:10 AM

## 2022-04-02 NOTE — Plan of Care (Signed)
  Problem: Education: Goal: Knowledge of the prescribed therapeutic regimen will improve Outcome: Progressing   Problem: Activity: Goal: Range of joint motion will improve Outcome: Progressing   Problem: Pain Management: Goal: Pain level will decrease with appropriate interventions Outcome: Progressing   Problem: Safety: Goal: Ability to remain free from injury will improve Outcome: Progressing   

## 2022-04-02 NOTE — Evaluation (Signed)
Physical Therapy Evaluation Patient Details Name: Breanna Hill MRN: [id number] DOB: 1940-02-26 Today's Date: 04/02/2022  History of Present Illness  82 yo female s/p L TKA on 04/02/22. PMH: L r TSA, HTN, breast CA, back surgery  Clinical Impression  Pt is s/p TKA resulting in the deficits listed below (see PT Problem List).  Pt reports her pain is now controlled, amb ~ 45' with RW and min assist. Anticipate steady progress. Dtr will be assisting at home.  Pt will benefit from acute skilled PT to increase their independence and safety with mobility to allow discharge.         Recommendations for follow up therapy are one component of a multi-disciplinary discharge planning process, led by the attending physician.  Recommendations may be updated based on patient status, additional functional criteria and insurance authorization.  Follow Up Recommendations       Assistance Recommended at Discharge Intermittent Supervision/Assistance  Patient can return home with the following  A little help with walking and/or transfers;A little help with bathing/dressing/bathroom;Assistance with cooking/housework;Assist for transportation;Help with stairs or ramp for entrance    Equipment Recommendations None recommended by PT  Recommendations for Other Services       Functional Status Assessment Patient has had a recent decline in their functional status and demonstrates the ability to make significant improvements in function in a reasonable and predictable amount of time.     Precautions / Restrictions Precautions Precautions: Knee;Fall Restrictions Weight Bearing Restrictions: No Other Position/Activity Restrictions: WBAT      Mobility  Bed Mobility Overal bed mobility: Needs Assistance Bed Mobility: Supine to Sit     Supine to sit: Min guard     General bed mobility comments: for safety    Transfers Overall transfer level: Needs assistance Equipment used: Rolling walker (2  wheels) Transfers: Sit to/from Stand Sit to Stand: Min assist           General transfer comment: verbal cues for hand placement    Ambulation/Gait Ambulation/Gait assistance: Min assist Gait Distance (Feet): 40 Feet Assistive device: Rolling walker (2 wheels) Gait Pattern/deviations: Step-to pattern       General Gait Details: cues for sequence and RW position  Stairs            Wheelchair Mobility    Modified Rankin (Stroke Patients Only)       Balance                                             Pertinent Vitals/Pain Pain Assessment Pain Assessment: 0-10 Pain Score: 5  Pain Location: Left knee Pain Descriptors / Indicators: Aching, Grimacing Pain Intervention(s): Limited activity within patient's tolerance, Monitored during session, Premedicated before session, Repositioned    Home Living Family/patient expects to be discharged to:: Private residence Living Arrangements: Alone Available Help at Discharge: Family Type of Home: House Home Access: Ramped entrance       Home Layout: One level Home Equipment: Conservation officer, nature (2 wheels);Cane - single point;BSC/3in1 Additional Comments: dtr and additional    Prior Function Prior Level of Function : Independent/Modified Independent                     Hand Dominance        Extremity/Trunk Assessment   Upper Extremity Assessment Upper Extremity Assessment: Overall WFL for tasks assessed    Lower  Extremity Assessment Lower Extremity Assessment: LLE deficits/detail LLE Deficits / Details: ankle WFL, knee extension and hip flexion 3/5       Communication   Communication: No difficulties  Cognition Arousal/Alertness: Awake/alert Behavior During Therapy: WFL for tasks assessed/performed Overall Cognitive Status: Within Functional Limits for tasks assessed                                          General Comments      Exercises Total Joint  Exercises Ankle Circles/Pumps: AROM, Both, 10 reps   Assessment/Plan    PT Assessment Patient needs continued PT services  PT Problem List Decreased strength;Decreased range of motion;Decreased activity tolerance;Decreased mobility;Pain;Decreased knowledge of precautions;Decreased knowledge of use of DME       PT Treatment Interventions DME instruction;Therapeutic exercise;Gait training;Functional mobility training;Therapeutic activities;Patient/family education    PT Goals (Current goals can be found in the Care Plan section)  Acute Rehab PT Goals PT Goal Formulation: With patient Time For Goal Achievement: 04/09/22 Potential to Achieve Goals: Good    Frequency 7X/week     Co-evaluation               AM-PAC PT "6 Clicks" Mobility  Outcome Measure Help needed turning from your back to your side while in a flat bed without using bedrails?: A Little Help needed moving from lying on your back to sitting on the side of a flat bed without using bedrails?: A Little Help needed moving to and from a bed to a chair (including a wheelchair)?: A Little Help needed standing up from a chair using your arms (e.g., wheelchair or bedside chair)?: A Little Help needed to walk in hospital room?: A Little Help needed climbing 3-5 steps with a railing? : A Little 6 Click Score: 18    End of Session Equipment Utilized During Treatment: Gait belt Activity Tolerance: Patient tolerated treatment well Patient left: in chair;with call bell/phone within reach;with chair alarm set Nurse Communication: Mobility status PT Visit Diagnosis: Other abnormalities of gait and mobility (R26.89);Difficulty in walking, not elsewhere classified (R26.2)    Time: [id number] PT Time Calculation (min) (ACUTE ONLY): 19 min   Charges:   PT Evaluation $PT Eval Low Complexity: Rachel, PT  Acute Rehab [facility]) [phone]  04/02/2022   [facility] 04/02/2022, 4:13 PM

## 2022-04-02 NOTE — Op Note (Signed)
DATE OF SURGERY:  04/02/2022 TIME: 9:25 AM  PATIENT NAME:  Breanna Hill   AGE: 82 y.o.    PRE-OPERATIVE DIAGNOSIS: End-stage left knee osteoarthritis  POST-OPERATIVE DIAGNOSIS:  Same  PROCEDURE: Left total Knee Arthroplasty  SURGEON:  Chinara Hertzberg A Siboney Requejo, MD   ASSISTANT: Dorise Bullion, PA-C, present and scrubbed throughout the case, critical for assistance with exposure, retraction, instrumentation, and closure.   OPERATIVE IMPLANTS:  Cemented Zimmer persona size 5 standard femur, C tibial baseplate, 10 mm MC poly, 29 mm cemented all poly patella Implant Name Type Inv. Item Serial No. Manufacturer Lot No. LRB No. Used Action  CEMENT BONE REFOBACIN [id number] US - [id number] Cement CEMENT BONE REFOBACIN [id number] [place]  ZIMMER RECON(ORTH,TRAU,BIO,SG) [id number] Left 2 Implanted  AUG MED ASF PS 4-5 C-D 10 [id number] Joint AUG MED ASF PS 4-5 C-D 10  ZIMMER RECON(ORTH,TRAU,BIO,SG) [id number] Left 1 Implanted  TIBIA STEM 5 DEG SZ C L KNEE - [id number] Knees TIBIA STEM 5 DEG SZ C L KNEE  ZIMMER RECON(ORTH,TRAU,BIO,SG) [id number] Left 1 Implanted  PATELLA ZIMMER 29MM - [id number] Orthopedic Implant PATELLA ZIMMER 29MM  ZIMMER RECON(ORTH,TRAU,BIO,SG) [id number] Left 1 Implanted  COMP FEM CMT PS STD 5 LT - [id number] Joint COMP FEM CMT PS STD 5 LT  ZIMMER RECON(ORTH,TRAU,BIO,SG) [id number] Left 1 Implanted      PREOPERATIVE INDICATIONS:  Breanna Hill is a 82 y.o. year old female with end stage bone on bone degenerative arthritis of the knee who failed conservative treatment, including injections, antiinflammatories, activity modification, and assistive devices, and had significant impairment of their activities of daily living, and elected for Total Knee Arthroplasty.   The risks, benefits, and alternatives were discussed at length including but not limited to the risks of infection, bleeding, nerve injury, stiffness, blood clots, the need for revision surgery, cardiopulmonary complications, among  others, and they were willing to proceed.  ESTIMATED BLOOD LOSS: 50cc  OPERATIVE DESCRIPTION:   Once adequate anesthesia was induced, preoperative antibiotics, 2 gm of ancef,1 gm of Tranexamic Acid, and 8 mg of Decadron administered, the patient was positioned supine with a left thigh tourniquet placed.  The left lower extremity was prepped and draped in sterile fashion.  A time-  out was performed identifying the patient, planned procedure, and the appropriate extremity.     The leg was  exsanguinated, tourniquet elevated to 250 mmHg.  A midline incision was  made followed by median parapatellar arthrotomy. Anterior horn of the medial meniscus was released and resected. A medial release was performed, the infrapatellar fat pad was resected with care taken to protect the patellar tendon. The suprapatellar fat was removed to exposed the distal anterior femur. The anterior horn of the lateral meniscus and ACL were released.    Following initial  exposure, I first started with the femur  The femoral  canal was opened with a drill, canal was suctioned to try to prevent fat emboli.  An  intramedullary rod was passed set at 6 degrees valgus, 10 mm. The distal femur was resected.  Following this resection, the tibia was  subluxated anteriorly.  Using the extramedullary guide, 10 mm of bone was resected off   the proximal lateral tibia.  The extension gap was tight with the block so elected to resect 59mm additional off the femur. We confirmed the gap would be  stable medially and laterally with a size 82mm spacer block as well as confirmed that the tibial cut was perpendicular in the coronal plane, checking with an  alignment rod.    Once this was done, the posterior femoral referencing femoral sizer was placed under to the posterior condyles with 3 degrees of external rotational which was parallel to the transepicondylar axis and perpendicular to [employer]. The femur was sized to be a size 5 in the  anterior-  posterior dimension. The  anterior, posterior, and  chamfer cuts were made without difficulty nor   notching making certain that I was along the anterior cortex to help  with flexion gap stability. Next a laminar spreader was placed with the knee in flexion and the medial lateral menisci were resected.  5 cc of the Exparel mixture was injected in the medial side of the back of the knee and 3 cc in the lateral side.  1/2 inch curved osteotome was used to resect posterior osteophyte that was then removed with a pituitary rongeur.       At this point, the tibia was sized to be a size C.  The size C tray was  then pinned in position. Trial reduction was now carried with a 5 femur, C tibia, a 10 mm MC insert.  The knee had full extension and was stable to varus valgus stress in extension.  The knee was stable in flexion and PCL was left intact.  Attention was next directed to the patella.  Precut  measurement was noted to be 19 mm.  I resected down to 12 mm and used a  35mm patellar button to restore patellar height as well as cover the cut surface.     The patella lug holes were drilled and a 29 mm patella poly trial was placed.    The knee was brought to full extension with good flexion stability with the patella tracking through the trochlea without application of pressure.     Next the femoral component was again assessed and determined to be seated and appropriately lateralized.  The femoral lug holes were drilled.  The femoral component was then removed. Tibial component was again assessed and felt to be seated and appropriately rotated with the medial third of the tubercle. The tibia was then drilled, and keel punched.     Final components were  opened and antibiotic cement was mixed.      Final implants were then  cemented onto cleaned and dried cut surfaces of bone with the knee brought to extension with a 29mm MC poly.  The knee was irrigated with sterile Betadine diluted in saline  as well as pulse lavage normal saline.  The synovial lining was  then injected a dilute Exparel.      Once the cement had fully cured, excess cement was removed throughout the knee.  I confirmed that I was satisfied with the range of motion and stability, and the final 100mm MC poly insert was chosen.  It was placed into the knee.         The tourniquet had been let down.  No significant hemostasis was required.  The medial parapatellar arthrotomy was then reapproximated using #1 Stratafix sutures with the knee  in flexion.  The remaining wound was closed with 0 stratafix, 2-0 Vicryl, and running 3-0 Monocryl. The knee was cleaned, dried, dressed sterilely using Dermabond and   Aquacel dressing.  The patient was then brought to recovery room in stable condition, tolerating the procedure  well. There were no complications.   Post op recs: WB: WBAT Abx: ancef Imaging: PACU xrays DVT prophylaxis: Aspirin 81mg  BID x4 weeks  Follow up: 2 weeks after surgery for a wound check with Dr. Zachery Dakins at [facility].  Address: [address], [place], [place] [address]  Office Phone: [phone]  Charlies Constable, MD Orthopaedic Surgery

## 2022-04-03 ENCOUNTER — Encounter (HOSPITAL_COMMUNITY): Payer: Self-pay | Admitting: Orthopedic Surgery

## 2022-04-03 DIAGNOSIS — Z853 Personal history of malignant neoplasm of breast: Secondary | ICD-10-CM | POA: Diagnosis not present

## 2022-04-03 DIAGNOSIS — I10 Essential (primary) hypertension: Secondary | ICD-10-CM | POA: Diagnosis not present

## 2022-04-03 DIAGNOSIS — E039 Hypothyroidism, unspecified: Secondary | ICD-10-CM | POA: Diagnosis not present

## 2022-04-03 DIAGNOSIS — Z79899 Other long term (current) drug therapy: Secondary | ICD-10-CM | POA: Diagnosis not present

## 2022-04-03 DIAGNOSIS — M1712 Unilateral primary osteoarthritis, left knee: Secondary | ICD-10-CM | POA: Diagnosis not present

## 2022-04-03 DIAGNOSIS — Z96612 Presence of left artificial shoulder joint: Secondary | ICD-10-CM | POA: Diagnosis not present

## 2022-04-03 LAB — CBC
HCT: 32.8 % — ABNORMAL LOW (ref 36.0–46.0)
Hemoglobin: 10.9 g/dL — ABNORMAL LOW (ref 12.0–15.0)
MCH: 30.7 pg (ref 26.0–34.0)
MCHC: 33.2 g/dL (ref 30.0–36.0)
MCV: 92.4 fL (ref 80.0–100.0)
Platelets: 263 10*3/uL (ref 150–400)
RBC: 3.55 MIL/uL — ABNORMAL LOW (ref 3.87–5.11)
RDW: 12.4 % (ref 11.5–15.5)
WBC: 12 10*3/uL — ABNORMAL HIGH (ref 4.0–10.5)
nRBC: 0 % (ref 0.0–0.2)

## 2022-04-03 LAB — BASIC METABOLIC PANEL
Anion gap: 8 (ref 5–15)
BUN: 16 mg/dL (ref 8–23)
CO2: 20 mmol/L — ABNORMAL LOW (ref 22–32)
Calcium: 8 mg/dL — ABNORMAL LOW (ref 8.9–10.3)
Chloride: 106 mmol/L (ref 98–111)
Creatinine, Ser: 1.11 mg/dL — ABNORMAL HIGH (ref 0.44–1.00)
GFR, Estimated: 49 mL/min — ABNORMAL LOW (ref >60)
Glucose, Bld: 153 mg/dL — ABNORMAL HIGH (ref 70–99)
Potassium: 3.7 mmol/L (ref 3.5–5.1)
Sodium: 134 mmol/L — ABNORMAL LOW (ref 135–145)

## 2022-04-03 MED ORDER — ASPIRIN 81 MG PO TBEC: 81.0000 mg | DELAYED_RELEASE_TABLET | Freq: Two times a day (BID) | ORAL | 0 refills | Status: AC

## 2022-04-03 MED ORDER — ACETAMINOPHEN 500 MG PO TABS: 1000.0000 mg | ORAL_TABLET | Freq: Three times a day (TID) | ORAL | 0 refills | Status: AC | PRN

## 2022-04-03 MED ORDER — OXYCODONE HCL 5 MG PO TABS: 5.0000 mg | ORAL_TABLET | ORAL | 0 refills | Status: AC | PRN

## 2022-04-03 MED ORDER — ONDANSETRON HCL 4 MG PO TABS: 4.0000 mg | ORAL_TABLET | Freq: Three times a day (TID) | ORAL | 0 refills | Status: AC | PRN

## 2022-04-03 NOTE — Progress Notes (Signed)
     Subjective:  Patient reports pain as moderate.  She did well with physical therapy yesterday she walked out into the hall.  She is hopeful to clear therapy to go home today.  Objective:   VITALS:   Vitals:   04/02/22 1404 04/02/22 2249 04/03/22 0219 04/03/22 0603  BP: (!) 142/71 124/63 (!) 141/64 (!) 152/69  Pulse: 75 79 85 86  Resp: 17 16 13 15   Temp: 98 F (36.7 C) 97.8 F (36.6 C) 97.8 F (36.6 C) 98.6 F (37 C)  TempSrc: Oral     SpO2: 99% 98% 96% 96%  Weight:      Height:        Sensation intact distally Intact pulses distally Dorsiflexion/Plantar flexion intact Incision: dressing C/D/I Compartment soft   Lab Results  Component Value Date   WBC 12.0 (H) 04/03/2022   HGB 10.9 (L) 04/03/2022   HCT 32.8 (L) 04/03/2022   MCV 92.4 04/03/2022   PLT 263 04/03/2022   BMET    Component Value Date/Time   NA 134 (L) 04/03/2022 0343   K 3.7 04/03/2022 0343   CL 106 04/03/2022 0343   CO2 20 (L) 04/03/2022 0343   GLUCOSE 153 (H) 04/03/2022 0343   BUN 16 04/03/2022 0343   CREATININE 1.11 (H) 04/03/2022 0343   CALCIUM 8.0 (L) 04/03/2022 0343   GFRNONAA 49 (L) 04/03/2022 0343      Xray: Total knee arthroplasty components good position no adverse features  Assessment/Plan: 1 Day Post-Op   Principal Problem:   Primary osteoarthritis of left knee  Status post left TKA 04/02/22   Post op recs: WB: WBAT Abx: ancef Imaging: PACU xrays DVT prophylaxis: Aspirin 81mg  BID x4 weeks Follow up: 2 weeks after surgery for a wound check with Dr. Zachery Dakins at [facility].  Address: [address] [place], [place], [place] [address]  Office Phone: [phone]    Breanna Hill 04/03/2022, 6:47 AM   Charlies Constable, MD  Contact information:   [phone] 7am-5pm epic message Dr. Zachery Dakins, or call office for patient follow up: [phone] After hours and holidays please check [email or web address] for group call information for [facility]

## 2022-04-03 NOTE — Progress Notes (Signed)
Physical Therapy Treatment Patient Details Name: Breanna Hill MRN: [id number] DOB: 10/07/1940 Today's Date: 04/03/2022   History of Present Illness 82 yo female s/p L TKA on 04/02/22. PMH: L r TSA, HTN, breast CA, back surgery    PT Comments    Pt progressing well, meeting goals and is motivated to d/c home today. Ready to d/ with family assist from PT standpoint.  Recommendations for follow up therapy are one component of a multi-disciplinary discharge planning process, led by the attending physician.  Recommendations may be updated based on patient status, additional functional criteria and insurance authorization.  Follow Up Recommendations       Assistance Recommended at Discharge Intermittent Supervision/Assistance  Patient can return home with the following A little help with walking and/or transfers;A little help with bathing/dressing/bathroom;Assistance with cooking/housework;Assist for transportation;Help with stairs or ramp for entrance   Equipment Recommendations  None recommended by PT    Recommendations for Other Services       Precautions / Restrictions Precautions Precautions: Knee;Fall Restrictions Weight Bearing Restrictions: No Other Position/Activity Restrictions: WBAT     Mobility  Bed Mobility Overal bed mobility: Needs Assistance Bed Mobility: Supine to Sit     Supine to sit: Min guard, Supervision     General bed mobility comments: for safety    Transfers Overall transfer level: Needs assistance Equipment used: Rolling walker (2 wheels) Transfers: Sit to/from Stand Sit to Stand: Min guard, Supervision           General transfer comment: verbal cues for hand placement and safety    Ambulation/Gait Ambulation/Gait assistance: Supervision, Min guard Gait Distance (Feet): 90 Feet Assistive device: Rolling walker (2 wheels) Gait Pattern/deviations: Step-to pattern, Decreased stance time - left       General Gait Details: cues for  sequence and RW position   Stairs             Wheelchair Mobility    Modified Rankin (Stroke Patients Only)       Balance                                            Cognition Arousal/Alertness: Awake/alert Behavior During Therapy: WFL for tasks assessed/performed Overall Cognitive Status: Within Functional Limits for tasks assessed                                          Exercises Total Joint Exercises Ankle Circles/Pumps: AROM, Both, 10 reps Quad Sets: AROM, Both, 10 reps Heel Slides: AROM, Left, 10 reps Straight Leg Raises: AROM, Left, 10 reps, Strengthening    General Comments        Pertinent Vitals/Pain Pain Assessment Pain Assessment: 0-10 Pain Score: 4  Pain Location: Left knee Pain Descriptors / Indicators: Aching, Grimacing Pain Intervention(s): Limited activity within patient's tolerance, Monitored during session, Premedicated before session, Repositioned    Home Living                          Prior Function            PT Goals (current goals can now be found in the care plan section) Acute Rehab PT Goals PT Goal Formulation: With patient Time For Goal Achievement: 04/09/22 Potential to Achieve Goals: Good  Progress towards PT goals: Progressing toward goals    Frequency    7X/week      PT Plan Current plan remains appropriate    Co-evaluation              AM-PAC PT "6 Clicks" Mobility   Outcome Measure  Help needed turning from your back to your side while in a flat bed without using bedrails?: A Little Help needed moving from lying on your back to sitting on the side of a flat bed without using bedrails?: None Help needed moving to and from a bed to a chair (including a wheelchair)?: A Little Help needed standing up from a chair using your arms (e.g., wheelchair or bedside chair)?: A Little Help needed to walk in hospital room?: A Little Help needed climbing 3-5 steps with  a railing? : A Little 6 Click Score: 19    End of Session Equipment Utilized During Treatment: Gait belt Activity Tolerance: Patient tolerated treatment well Patient left: in bed;with call bell/phone within reach;with bed alarm set Nurse Communication: Mobility status PT Visit Diagnosis: Other abnormalities of gait and mobility (R26.89);Difficulty in walking, not elsewhere classified (R26.2)     Time: [id number] PT Time Calculation (min) (ACUTE ONLY): 14 min  Charges:  $Gait Training: 8-22 mins                     Baxter Flattery, PT  Acute Rehab Dept [facility]) [phone]  04/03/2022    [facility] 04/03/2022, 11:56 AM

## 2022-04-03 NOTE — Discharge Summary (Signed)
Physician Discharge Summary  Patient ID: Breanna Hill MRN: [id number] DOB/AGE: 83/17/1942 83 y.o.  Admit date: 04/02/2022 Discharge date: 04/03/2022  Admission Diagnoses:  Primary osteoarthritis of left knee  Discharge Diagnoses:  Principal Problem:   Primary osteoarthritis of left knee   Past Medical History:  Diagnosis Date   Arthritis    R hand    Breast cancer    left   Breast disorder    cancer   Cancer    breast CA- diagnosed with needle biopsy   Depression    GERD (gastroesophageal reflux disease)    rare use of tums    High cholesterol 10/17/2015   History of radiation therapy 03/27/17- 04/23/17   Left Breast, 2.67 Gy in 15 fractions for a total dose of 40.05 Gy. Boost, 2 Gy in 5 fractions for a total dose of 10 Gy.    Hypertension    Hypothyroidism    Pre-diabetes    Hgba1C- in the 6 's , per pt., she monitors her diet    Surgeries: Procedure(s): TOTAL KNEE ARTHROPLASTY on 04/02/2022   Consultants (if any):   Discharged Condition: Improved  Hospital Course: Breanna Hill is an 82 y.o. female who was admitted 04/02/2022 with a diagnosis of Primary osteoarthritis of left knee and went to the operating room on 04/02/2022 and underwent the above named procedures.    She was given perioperative antibiotics:  Anti-infectives (From admission, onward)    Start     Dose/Rate Route Frequency Ordered Stop   04/02/22 1400  ceFAZolin (ANCEF) IVPB 2g/100 mL premix        2 g 200 mL/hr over 30 Minutes Intravenous Every 6 hours 04/02/22 1157 04/02/22 2130   04/02/22 0600  ceFAZolin (ANCEF) IVPB 2g/100 mL premix        2 g 200 mL/hr over 30 Minutes Intravenous On call to O.R. 04/02/22 [id number] 04/02/22 0803     .  She was given sequential compression devices, early ambulation, and aspirin for DVT prophylaxis.  She benefited maximally from the hospital stay and there were no complications.    Recent vital signs:  Vitals:   04/03/22 0603 04/03/22 0949  BP: (!) 152/69  (!) 152/61  Pulse: 86 87  Resp: 15 14  Temp: 98.6 F (37 C) 99.1 F (37.3 C)  SpO2: 96% 100%    Recent laboratory studies:  Lab Results  Component Value Date   HGB 10.9 (L) 04/03/2022   HGB 12.7 03/20/2022   HGB 13.1 09/14/2020   Lab Results  Component Value Date   WBC 12.0 (H) 04/03/2022   PLT 263 04/03/2022   No results found for: "INR" Lab Results  Component Value Date   NA 134 (L) 04/03/2022   K 3.7 04/03/2022   CL 106 04/03/2022   CO2 20 (L) 04/03/2022   BUN 16 04/03/2022   CREATININE 1.11 (H) 04/03/2022   GLUCOSE 153 (H) 04/03/2022    Discharge Medications:   Allergies as of 04/03/2022       Reactions   Penicillins Rash   Has patient had a PCN reaction causing immediate rash, facial/tongue/throat swelling, SOB or lightheadedness with hypotension: No Has patient had a PCN reaction causing severe rash involving mucus membranes or skin necrosis: No Has patient had a PCN reaction that required hospitalization: No Has patient had a PCN reaction occurring within the last 10 years: No If all of the above answers are "NO", then may proceed with Cephalosporin use.  Medication List     STOP taking these medications    acetaminophen 650 MG CR tablet Commonly known as: TYLENOL Replaced by: acetaminophen 500 MG tablet       TAKE these medications    acetaminophen 500 MG tablet Commonly known as: TYLENOL Take 2 tablets (1,000 mg total) by mouth every 8 (eight) hours as needed. Replaces: acetaminophen 650 MG CR tablet   aspirin EC 81 MG tablet Take 1 tablet (81 mg total) by mouth in the morning and at bedtime. Swallow whole. What changed: when to take this   atorvastatin 20 MG tablet Commonly known as: LIPITOR Take 20 mg by mouth in the morning.   CALCIUM 600+D PO Take 1 tablet by mouth at bedtime.   Gemtesa 75 MG Tabs Generic drug: Vibegron TAKE 1 TABLET BY MOUTH AT 2PM What changed: See the new instructions.   letrozole 2.5 MG  tablet Commonly known as: FEMARA TAKE 1 TABLET BY MOUTH EVERY DAY   levothyroxine 88 MCG tablet Commonly known as: SYNTHROID Take 88 mcg by mouth daily before breakfast.   mirtazapine 15 MG tablet Commonly known as: REMERON Take 15 mg by mouth at bedtime.   olmesartan 20 MG tablet Commonly known as: BENICAR Take 20 mg by mouth daily.   ondansetron 4 MG tablet Commonly known as: Zofran Take 1 tablet (4 mg total) by mouth every 8 (eight) hours as needed for up to 14 days for nausea or vomiting.   oxyCODONE 5 MG immediate release tablet Commonly known as: Roxicodone Take 1 tablet (5 mg total) by mouth every 4 (four) hours as needed for up to 7 days for severe pain or moderate pain.   Prevagen 10 MG Caps Generic drug: Apoaequorin Take 10 mg by mouth in the morning.   senna-docusate 8.6-50 MG tablet Commonly known as: Senokot-S Take 1 tablet by mouth daily.   trospium 20 MG tablet Commonly known as: SANCTURA TAKE 1 TABLET BY MOUTH TWICE A DAY   Vitamin D-3 125 MCG (5000 UT) Tabs Take 5,000 Units by mouth in the morning.   Voltaren Arthritis Pain 1 % Gel Generic drug: diclofenac Sodium Apply 2 g topically in the morning. Knee   zolpidem 5 MG tablet Commonly known as: AMBIEN Take 2.5 tablets (12.5 mg total) by mouth at bedtime as needed for sleep. What changed:  how much to take when to take this        Diagnostic Studies: DG Knee Left Port  Result Date: 04/02/2022 CLINICAL DATA:  Postop left knee EXAM: PORTABLE LEFT KNEE - 2 VIEW COMPARISON:  None Available. FINDINGS: Two views of the left knee demonstrate total knee arthroplasty with cemented femoral and tibial component, patellar button. Soft tissue gas identified including along the joint space. Expected alignment. No fracture or dislocation. Imaging was obtained to aid in treatment. IMPRESSION: Surgical changes of total knee arthroplasty Electronically Signed   By: Jill Side M.D.   On: 04/02/2022 11:06     Disposition: Discharge disposition: 01-Home or Self Care       Discharge Instructions     Call MD / Call 911   Complete by: As directed    If you experience chest pain or shortness of breath, CALL 911 and be transported to the hospital emergency room.  If you develope a fever above 101 F, pus (white drainage) or increased drainage or redness at the wound, or calf pain, call your surgeon's office.   Constipation Prevention   Complete by: As directed  Drink plenty of fluids.  Prune juice may be helpful.  You may use a stool softener, such as Colace (over the counter) 100 mg twice a day.  Use MiraLax (over the counter) for constipation as needed.   Diet - low sodium heart healthy   Complete by: As directed    Do not put a pillow under the knee. Place it under the heel.   Complete by: As directed    Increase activity slowly as tolerated   Complete by: As directed    Post-operative opioid taper instructions:   Complete by: As directed    POST-OPERATIVE OPIOID TAPER INSTRUCTIONS: It is important to wean off of your opioid medication as soon as possible. If you do not need pain medication after your surgery it is ok to stop day one. Opioids include: Codeine, Hydrocodone(Norco, Vicodin), Oxycodone(Percocet, oxycontin) and hydromorphone amongst others.  Long term and even short term use of opiods can cause: Increased pain response Dependence Constipation Depression Respiratory depression And more.  Withdrawal symptoms can include Flu like symptoms Nausea, vomiting And more Techniques to manage these symptoms Hydrate well Eat regular healthy meals Stay active Use relaxation techniques(deep breathing, meditating, yoga) Do Not substitute Alcohol to help with tapering If you have been on opioids for less than two weeks and do not have pain than it is ok to stop all together.  Plan to wean off of opioids This plan should start within one week post op of your joint  replacement. Maintain the same interval or time between taking each dose and first decrease the dose.  Cut the total daily intake of opioids by one tablet each day Next start to increase the time between doses. The last dose that should be eliminated is the evening dose.           Follow-up Information     Willaim Sheng, MD. Go on 04/17/2022.   Specialty: Orthopedic Surgery Why: Your appointment is scheduled for 2:45 Contact information: [address] [place] [address] [phone]         [facility] [place] Follow up.   Specialty: Home Health Services Why: HHPT will provide 6 home visits prior to starting outpatient physical therapy Contact information: [place] [id number] [phone]         [facility] Follow up.   Why: Your outpatient physical therapy is scheduled for 10:00 Contact information: [place] [id number] [phone]                    Discharge Instructions      INSTRUCTIONS AFTER JOINT REPLACEMENT   Remove items at home which could result in a fall. This includes throw rugs or furniture in walking pathways ICE to the affected joint every three hours while awake for 30 minutes at a time, for at least the first 3-5 days, and then as needed for pain and swelling.  Continue to use ice for pain and swelling. You may notice swelling that will progress down to the foot and ankle.  This is normal after surgery.  Elevate your leg when you are not up walking on it.   Continue to use the breathing machine you got in the hospital (incentive spirometer) which will help keep your temperature down.  It is common for your temperature to cycle up and down following surgery, especially at night when you are not  up moving around and exerting yourself.  The breathing machine keeps your lungs expanded and your temperature down.   DIET:   As you were doing prior to hospitalization, we recommend a well-balanced diet.  DRESSING / WOUND CARE / SHOWERING  Keep the surgical dressing until follow up.  The dressing is water proof, so you can shower without any extra covering.  IF THE DRESSING FALLS OFF or the wound gets wet inside, change the dressing with sterile gauze.  Please use good hand washing techniques before changing the dressing.  Do not use any lotions or creams on the incision until instructed by your surgeon.    ACTIVITY  Increase activity slowly as tolerated, but follow the weight bearing instructions below.   No driving for 6 weeks or until further direction given by your physician.  You cannot drive while taking narcotics.  No lifting or carrying greater than 10 lbs. until further directed by your surgeon. Avoid periods of inactivity such as sitting longer than an hour when not asleep. This helps prevent blood clots.  You may return to work once you are authorized by your doctor.     WEIGHT BEARING   Weight bearing as tolerated with assist device (walker, cane, etc) as directed, use it as long as suggested by your surgeon or therapist, typically at least 4-6 weeks.   EXERCISES  Results after joint replacement surgery are often greatly improved when you follow the exercise, range of motion and muscle strengthening exercises prescribed by your doctor. Safety measures are also important to protect the joint from further injury. Any time any of these exercises cause you to have increased pain or swelling, decrease what you are doing until you are comfortable again and then slowly increase them. If you have problems or questions, call your caregiver or physical therapist for advice.   Rehabilitation is important following a joint replacement. After just a few days of immobilization, the muscles of the leg can become weakened and shrink (atrophy).  These exercises are designed to build up the tone and strength of the  thigh and leg muscles and to improve motion. Often times heat used for twenty to thirty minutes before working out will loosen up your tissues and help with improving the range of motion but do not use heat for the first two weeks following surgery (sometimes heat can increase post-operative swelling).   These exercises can be done on a training (exercise) mat, on the floor, on a table or on a bed. Use whatever works the best and is most comfortable for you.    Use music or television while you are exercising so that the exercises are a pleasant break in your day. This will make your life better with the exercises acting as a break in your routine that you can look forward to.   Perform all exercises about fifteen times, three times per day or as directed.  You should exercise both the operative leg and the other leg as well.  Exercises include:   Quad Sets - Tighten up the muscle on the front of the thigh (Quad) and hold for 5-10 seconds.   Straight Leg Raises - With your knee straight (if you were given a brace, keep it on), lift the leg to 60 degrees, hold for 3 seconds, and slowly lower the leg.  Perform this exercise against resistance later as your leg gets stronger.  Leg Slides: Lying on your back, slowly slide your foot toward your buttocks, bending your  knee up off the floor (only go as far as is comfortable). Then slowly slide your foot back down until your leg is flat on the floor again.  Angel Wings: Lying on your back spread your legs to the side as far apart as you can without causing discomfort.  Hamstring Strength:  Lying on your back, push your heel against the floor with your leg straight by tightening up the muscles of your buttocks.  Repeat, but this time bend your knee to a comfortable angle, and push your heel against the floor.  You may put a pillow under the heel to make it more comfortable if necessary.   A rehabilitation program following joint replacement surgery can speed  recovery and prevent re-injury in the future due to weakened muscles. Contact your doctor or a physical therapist for more information on knee rehabilitation.    CONSTIPATION  Constipation is defined medically as fewer than three stools per week and severe constipation as less than one stool per week.  Even if you have a regular bowel pattern at home, your normal regimen is likely to be disrupted due to multiple reasons following surgery.  Combination of anesthesia, postoperative narcotics, change in appetite and fluid intake all can affect your bowels.   YOU MUST use at least one of the following options; they are listed in order of increasing strength to get the job done.  They are all available over the counter, and you may need to use some, POSSIBLY even all of these options:    Drink plenty of fluids (prune juice may be helpful) and high fiber foods Colace 100 mg by mouth twice a day  Senokot for constipation as directed and as needed Dulcolax (bisacodyl), take with full glass of water  Miralax (polyethylene glycol) once or twice a day as needed.  If you have tried all these things and are unable to have a bowel movement in the first 3-4 days after surgery call either your surgeon or your primary doctor.    If you experience loose stools or diarrhea, hold the medications until you stool forms back up.  If your symptoms do not get better within 1 week or if they get worse, check with your doctor.  If you experience "the worst abdominal pain ever" or develop nausea or vomiting, please contact the office immediately for further recommendations for treatment.   ITCHING:  If you experience itching with your medications, try taking only a single pain pill, or even half a pain pill at a time.  You can also use Benadryl over the counter for itching or also to help with sleep.   TED HOSE STOCKINGS:  Use stockings on both legs until for at least 2 weeks or as directed by physician office. They may be  removed at night for sleeping.  MEDICATIONS:  See your medication summary on the "After Visit Summary" that nursing will review with you.  You may have some home medications which will be placed on hold until you complete the course of blood thinner medication.  It is important for you to complete the blood thinner medication as prescribed.   Blood clot prevention (DVT Prophylaxis): After surgery you are at an increased risk for a blood clot. you were prescribed a blood thinner, Aspirin 81mg , to be taken twice daily for a total of 4 weeks from surgery to help reduce your risk of getting a blood clot. This will help prevent a blood clot. Signs of a pulmonary embolus (blood  clot in the lungs) include sudden short of breath, feeling lightheaded or dizzy, chest pain with a deep breath, rapid pulse rapid breathing. Signs of a blood clot in your arms or legs include new unexplained swelling and cramping, warm, red or darkened skin around the painful area. Please call the office or 911 right away if these signs or symptoms develop.  PRECAUTIONS:  If you experience chest pain or shortness of breath - call 911 immediately for transfer to the hospital emergency department.   If you develop a fever greater that 101 F, purulent drainage from wound, increased redness or drainage from wound, foul odor from the wound/dressing, or calf pain - CONTACT YOUR SURGEON.                                                   FOLLOW-UP APPOINTMENTS:  If you do not already have a post-op appointment, please call the office for an appointment to be seen by your surgeon.  Guidelines for how soon to be seen are listed in your "After Visit Summary", but are typically between 2-3 weeks after surgery.  OTHER INSTRUCTIONS:   Knee Replacement:  Do not place pillow under knee, focus on keeping the knee straight while resting. CPM instructions: 0-90 degrees, 2 hours in the morning, 2 hours in the afternoon, and 2 hours in the evening. Place  foam block, curve side up under heel at all times except when in CPM or when walking.  DO NOT modify, tear, cut, or change the foam block in any way.  POST-OPERATIVE OPIOID TAPER INSTRUCTIONS: It is important to wean off of your opioid medication as soon as possible. If you do not need pain medication after your surgery it is ok to stop day one. Opioids include: Codeine, Hydrocodone(Norco, Vicodin), Oxycodone(Percocet, oxycontin) and hydromorphone amongst others.  Long term and even short term use of opiods can cause: Increased pain response Dependence Constipation Depression Respiratory depression And more.  Withdrawal symptoms can include Flu like symptoms Nausea, vomiting And more Techniques to manage these symptoms Hydrate well Eat regular healthy meals Stay active Use relaxation techniques(deep breathing, meditating, yoga) Do Not substitute Alcohol to help with tapering If you have been on opioids for less than two weeks and do not have pain than it is ok to stop all together.  Plan to wean off of opioids This plan should start within one week post op of your joint replacement. Maintain the same interval or time between taking each dose and first decrease the dose.  Cut the total daily intake of opioids by one tablet each day Next start to increase the time between doses. The last dose that should be eliminated is the evening dose.   MAKE SURE YOU:  Understand these instructions.  Get help right away if you are not doing well or get worse.    Thank you for letting us be a part of your medical care team.  It is a privilege we respect greatly.  We hope these instructions will help you stay on track for a fast and full recovery!            Signed: Derek Huneycutt A Ezrael Sam 04/03/2022, 11:00 PM

## 2022-04-03 NOTE — Progress Notes (Signed)
Discharge instructions given to patients brother and sister in law  D Clinical research associate

## 2022-04-03 NOTE — TOC Transition Note (Signed)
Transition of Care [facility]) - CM/SW Discharge Note  Patient Details  Name: Breanna Hill MRN: [id number] Date of Birth: [birth year]  [facility] CM/SW Contact:  Sherie Don, LCSW Phone Number: 04/03/2022, 10:03 AM  Clinical Narrative: Patient is expected to discharge home after working with PT. CSW met with patient to confirm discharge plan. Patient will go home with HHPT, which was prearranged with [facility]. Patient has a rolling walker, BSC, and transfer chair at home so there are no DME needs at this time. TOC signing off.    Final next level of care: Home w Home Health Services Barriers to Discharge: No Barriers Identified  Patient Goals and CMS Choice CMS Medicare.gov Compare Post Acute Care list provided to:: Patient Choice offered to / list presented to : Patient  Discharge Plan and Services Additional resources added to the After Visit Summary for           DME Arranged: N/A DME Agency: NA HH Arranged: PT HH Agency: Magazine features editor spoke with at [place] in orthopedist's office  Social Determinants of Health ([place]) Interventions SDOH Screenings   Food Insecurity: No Food Insecurity (04/02/2022)  Housing: Low Risk  (04/02/2022)  Transportation Needs: No Transportation Needs (04/02/2022)  Utilities: Not At Risk (04/02/2022)  Alcohol Screen: Low Risk  (08/10/2021)  Depression (PHQ2-9): Low Risk  (08/10/2021)  Financial Resource Strain: Low Risk  (08/10/2021)  Physical Activity: Inactive (08/10/2021)  Social Connections: Moderately Integrated (08/10/2021)  Stress: No Stress Concern Present (08/10/2021)  Tobacco Use: Low Risk  (04/02/2022)   Readmission Risk Interventions     No data to display

## 2022-04-03 NOTE — Plan of Care (Signed)
  Problem: Education: Goal: Knowledge of the prescribed therapeutic regimen will improve Outcome: Adequate for Discharge Goal: Individualized Educational Video(s) Outcome: Adequate for Discharge   Problem: Activity: Goal: Ability to avoid complications of mobility impairment will improve Outcome: Adequate for Discharge Goal: Range of joint motion will improve Outcome: Adequate for Discharge   Problem: Clinical Measurements: Goal: Postoperative complications will be avoided or minimized Outcome: Adequate for Discharge   Problem: Pain Management: Goal: Pain level will decrease with appropriate interventions Outcome: Adequate for Discharge   Problem: Education: Goal: Ability to describe self-care measures that may prevent or decrease complications (Diabetes Survival Skills Education) will improve Outcome: Adequate for Discharge Goal: Individualized Educational Video(s) Outcome: Adequate for Discharge   Problem: Coping: Goal: Ability to adjust to condition or change in health will improve Outcome: Adequate for Discharge   Problem: Fluid Volume: Goal: Ability to maintain a balanced intake and output will improve Outcome: Adequate for Discharge   Problem: Health Behavior/Discharge Planning: Goal: Ability to identify and utilize available resources and services will improve Outcome: Adequate for Discharge Goal: Ability to manage health-related needs will improve Outcome: Adequate for Discharge   Problem: Metabolic: Goal: Ability to maintain appropriate glucose levels will improve Outcome: Adequate for Discharge   Problem: Nutritional: Goal: Maintenance of adequate nutrition will improve Outcome: Adequate for Discharge Goal: Progress toward achieving an optimal weight will improve Outcome: Adequate for Discharge   Problem: Skin Integrity: Goal: Risk for impaired skin integrity will decrease Outcome: Adequate for Discharge   Problem: Tissue Perfusion: Goal: Adequacy of  tissue perfusion will improve Outcome: Adequate for Discharge   Problem: Education: Goal: Knowledge of General Education information will improve Description: Including pain rating scale, medication(s)/side effects and non-pharmacologic comfort measures Outcome: Adequate for Discharge   Problem: Health Behavior/Discharge Planning: Goal: Ability to manage health-related needs will improve Outcome: Adequate for Discharge   Problem: Clinical Measurements: Goal: Ability to maintain clinical measurements within normal limits will improve Outcome: Adequate for Discharge Goal: Will remain free from infection Outcome: Adequate for Discharge Goal: Diagnostic test results will improve Outcome: Adequate for Discharge Goal: Respiratory complications will improve Outcome: Adequate for Discharge Goal: Cardiovascular complication will be avoided Outcome: Adequate for Discharge   Problem: Activity: Goal: Risk for activity intolerance will decrease Outcome: Adequate for Discharge   Problem: Nutrition: Goal: Adequate nutrition will be maintained Outcome: Adequate for Discharge   Problem: Coping: Goal: Level of anxiety will decrease Outcome: Adequate for Discharge   Problem: Elimination: Goal: Will not experience complications related to bowel motility Outcome: Adequate for Discharge Goal: Will not experience complications related to urinary retention Outcome: Adequate for Discharge   Problem: Pain Managment: Goal: General experience of comfort will improve Outcome: Adequate for Discharge   Problem: Safety: Goal: Ability to remain free from injury will improve Outcome: Adequate for Discharge

## 2022-04-04 DIAGNOSIS — Z96652 Presence of left artificial knee joint: Secondary | ICD-10-CM | POA: Diagnosis not present

## 2022-04-04 DIAGNOSIS — Z79811 Long term (current) use of aromatase inhibitors: Secondary | ICD-10-CM | POA: Diagnosis not present

## 2022-04-04 DIAGNOSIS — G8929 Other chronic pain: Secondary | ICD-10-CM | POA: Diagnosis not present

## 2022-04-04 DIAGNOSIS — R7303 Prediabetes: Secondary | ICD-10-CM | POA: Diagnosis not present

## 2022-04-04 DIAGNOSIS — K219 Gastro-esophageal reflux disease without esophagitis: Secondary | ICD-10-CM | POA: Diagnosis not present

## 2022-04-04 DIAGNOSIS — Z9181 History of falling: Secondary | ICD-10-CM | POA: Diagnosis not present

## 2022-04-04 DIAGNOSIS — Z86010 Personal history of colonic polyps: Secondary | ICD-10-CM | POA: Diagnosis not present

## 2022-04-04 DIAGNOSIS — E7800 Pure hypercholesterolemia, unspecified: Secondary | ICD-10-CM | POA: Diagnosis not present

## 2022-04-04 DIAGNOSIS — R69 Illness, unspecified: Secondary | ICD-10-CM | POA: Diagnosis not present

## 2022-04-04 DIAGNOSIS — E039 Hypothyroidism, unspecified: Secondary | ICD-10-CM | POA: Diagnosis not present

## 2022-04-04 DIAGNOSIS — Z7982 Long term (current) use of aspirin: Secondary | ICD-10-CM | POA: Diagnosis not present

## 2022-04-04 DIAGNOSIS — I10 Essential (primary) hypertension: Secondary | ICD-10-CM | POA: Diagnosis not present

## 2022-04-04 DIAGNOSIS — Z853 Personal history of malignant neoplasm of breast: Secondary | ICD-10-CM | POA: Diagnosis not present

## 2022-04-04 DIAGNOSIS — Z471 Aftercare following joint replacement surgery: Secondary | ICD-10-CM | POA: Diagnosis not present

## 2022-04-04 DIAGNOSIS — M19041 Primary osteoarthritis, right hand: Secondary | ICD-10-CM | POA: Diagnosis not present

## 2022-04-04 DIAGNOSIS — Z96612 Presence of left artificial shoulder joint: Secondary | ICD-10-CM | POA: Diagnosis not present

## 2022-04-04 NOTE — Addendum Note (Signed)
Addendum  created 04/04/22 1032 by Effie Berkshire, MD   Clinical Note Signed, Intraprocedure Blocks edited, SmartForm saved

## 2022-04-06 DIAGNOSIS — Z853 Personal history of malignant neoplasm of breast: Secondary | ICD-10-CM | POA: Diagnosis not present

## 2022-04-06 DIAGNOSIS — G8929 Other chronic pain: Secondary | ICD-10-CM | POA: Diagnosis not present

## 2022-04-06 DIAGNOSIS — R69 Illness, unspecified: Secondary | ICD-10-CM | POA: Diagnosis not present

## 2022-04-06 DIAGNOSIS — Z9181 History of falling: Secondary | ICD-10-CM | POA: Diagnosis not present

## 2022-04-06 DIAGNOSIS — M19041 Primary osteoarthritis, right hand: Secondary | ICD-10-CM | POA: Diagnosis not present

## 2022-04-06 DIAGNOSIS — E039 Hypothyroidism, unspecified: Secondary | ICD-10-CM | POA: Diagnosis not present

## 2022-04-06 DIAGNOSIS — K219 Gastro-esophageal reflux disease without esophagitis: Secondary | ICD-10-CM | POA: Diagnosis not present

## 2022-04-06 DIAGNOSIS — R7303 Prediabetes: Secondary | ICD-10-CM | POA: Diagnosis not present

## 2022-04-06 DIAGNOSIS — I10 Essential (primary) hypertension: Secondary | ICD-10-CM | POA: Diagnosis not present

## 2022-04-06 DIAGNOSIS — Z96652 Presence of left artificial knee joint: Secondary | ICD-10-CM | POA: Diagnosis not present

## 2022-04-06 DIAGNOSIS — Z471 Aftercare following joint replacement surgery: Secondary | ICD-10-CM | POA: Diagnosis not present

## 2022-04-06 DIAGNOSIS — Z96612 Presence of left artificial shoulder joint: Secondary | ICD-10-CM | POA: Diagnosis not present

## 2022-04-06 DIAGNOSIS — Z86010 Personal history of colonic polyps: Secondary | ICD-10-CM | POA: Diagnosis not present

## 2022-04-06 DIAGNOSIS — Z79811 Long term (current) use of aromatase inhibitors: Secondary | ICD-10-CM | POA: Diagnosis not present

## 2022-04-06 DIAGNOSIS — Z7982 Long term (current) use of aspirin: Secondary | ICD-10-CM | POA: Diagnosis not present

## 2022-04-06 DIAGNOSIS — E7800 Pure hypercholesterolemia, unspecified: Secondary | ICD-10-CM | POA: Diagnosis not present

## 2022-04-08 DIAGNOSIS — G8929 Other chronic pain: Secondary | ICD-10-CM | POA: Diagnosis not present

## 2022-04-08 DIAGNOSIS — Z96652 Presence of left artificial knee joint: Secondary | ICD-10-CM | POA: Diagnosis not present

## 2022-04-08 DIAGNOSIS — R7303 Prediabetes: Secondary | ICD-10-CM | POA: Diagnosis not present

## 2022-04-08 DIAGNOSIS — R69 Illness, unspecified: Secondary | ICD-10-CM | POA: Diagnosis not present

## 2022-04-08 DIAGNOSIS — E039 Hypothyroidism, unspecified: Secondary | ICD-10-CM | POA: Diagnosis not present

## 2022-04-08 DIAGNOSIS — M19041 Primary osteoarthritis, right hand: Secondary | ICD-10-CM | POA: Diagnosis not present

## 2022-04-08 DIAGNOSIS — Z9181 History of falling: Secondary | ICD-10-CM | POA: Diagnosis not present

## 2022-04-08 DIAGNOSIS — Z96612 Presence of left artificial shoulder joint: Secondary | ICD-10-CM | POA: Diagnosis not present

## 2022-04-08 DIAGNOSIS — K219 Gastro-esophageal reflux disease without esophagitis: Secondary | ICD-10-CM | POA: Diagnosis not present

## 2022-04-08 DIAGNOSIS — Z853 Personal history of malignant neoplasm of breast: Secondary | ICD-10-CM | POA: Diagnosis not present

## 2022-04-08 DIAGNOSIS — Z79811 Long term (current) use of aromatase inhibitors: Secondary | ICD-10-CM | POA: Diagnosis not present

## 2022-04-08 DIAGNOSIS — Z86010 Personal history of colonic polyps: Secondary | ICD-10-CM | POA: Diagnosis not present

## 2022-04-08 DIAGNOSIS — Z471 Aftercare following joint replacement surgery: Secondary | ICD-10-CM | POA: Diagnosis not present

## 2022-04-08 DIAGNOSIS — Z7982 Long term (current) use of aspirin: Secondary | ICD-10-CM | POA: Diagnosis not present

## 2022-04-08 DIAGNOSIS — I10 Essential (primary) hypertension: Secondary | ICD-10-CM | POA: Diagnosis not present

## 2022-04-08 DIAGNOSIS — E7800 Pure hypercholesterolemia, unspecified: Secondary | ICD-10-CM | POA: Diagnosis not present

## 2022-04-11 DIAGNOSIS — I10 Essential (primary) hypertension: Secondary | ICD-10-CM | POA: Diagnosis not present

## 2022-04-11 DIAGNOSIS — Z471 Aftercare following joint replacement surgery: Secondary | ICD-10-CM | POA: Diagnosis not present

## 2022-04-11 DIAGNOSIS — R7303 Prediabetes: Secondary | ICD-10-CM | POA: Diagnosis not present

## 2022-04-11 DIAGNOSIS — E039 Hypothyroidism, unspecified: Secondary | ICD-10-CM | POA: Diagnosis not present

## 2022-04-11 DIAGNOSIS — M19041 Primary osteoarthritis, right hand: Secondary | ICD-10-CM | POA: Diagnosis not present

## 2022-04-11 DIAGNOSIS — Z96652 Presence of left artificial knee joint: Secondary | ICD-10-CM | POA: Diagnosis not present

## 2022-04-11 DIAGNOSIS — Z86010 Personal history of colonic polyps: Secondary | ICD-10-CM | POA: Diagnosis not present

## 2022-04-11 DIAGNOSIS — Z7982 Long term (current) use of aspirin: Secondary | ICD-10-CM | POA: Diagnosis not present

## 2022-04-11 DIAGNOSIS — G8929 Other chronic pain: Secondary | ICD-10-CM | POA: Diagnosis not present

## 2022-04-11 DIAGNOSIS — Z9181 History of falling: Secondary | ICD-10-CM | POA: Diagnosis not present

## 2022-04-11 DIAGNOSIS — K219 Gastro-esophageal reflux disease without esophagitis: Secondary | ICD-10-CM | POA: Diagnosis not present

## 2022-04-11 DIAGNOSIS — R69 Illness, unspecified: Secondary | ICD-10-CM | POA: Diagnosis not present

## 2022-04-11 DIAGNOSIS — E7800 Pure hypercholesterolemia, unspecified: Secondary | ICD-10-CM | POA: Diagnosis not present

## 2022-04-11 DIAGNOSIS — Z853 Personal history of malignant neoplasm of breast: Secondary | ICD-10-CM | POA: Diagnosis not present

## 2022-04-11 DIAGNOSIS — Z96612 Presence of left artificial shoulder joint: Secondary | ICD-10-CM | POA: Diagnosis not present

## 2022-04-11 DIAGNOSIS — Z79811 Long term (current) use of aromatase inhibitors: Secondary | ICD-10-CM | POA: Diagnosis not present

## 2022-04-13 DIAGNOSIS — I10 Essential (primary) hypertension: Secondary | ICD-10-CM | POA: Diagnosis not present

## 2022-04-13 DIAGNOSIS — Z96652 Presence of left artificial knee joint: Secondary | ICD-10-CM | POA: Diagnosis not present

## 2022-04-13 DIAGNOSIS — E039 Hypothyroidism, unspecified: Secondary | ICD-10-CM | POA: Diagnosis not present

## 2022-04-13 DIAGNOSIS — R7303 Prediabetes: Secondary | ICD-10-CM | POA: Diagnosis not present

## 2022-04-13 DIAGNOSIS — Z853 Personal history of malignant neoplasm of breast: Secondary | ICD-10-CM | POA: Diagnosis not present

## 2022-04-13 DIAGNOSIS — K219 Gastro-esophageal reflux disease without esophagitis: Secondary | ICD-10-CM | POA: Diagnosis not present

## 2022-04-13 DIAGNOSIS — G8929 Other chronic pain: Secondary | ICD-10-CM | POA: Diagnosis not present

## 2022-04-13 DIAGNOSIS — E7800 Pure hypercholesterolemia, unspecified: Secondary | ICD-10-CM | POA: Diagnosis not present

## 2022-04-13 DIAGNOSIS — Z86010 Personal history of colonic polyps: Secondary | ICD-10-CM | POA: Diagnosis not present

## 2022-04-13 DIAGNOSIS — Z79811 Long term (current) use of aromatase inhibitors: Secondary | ICD-10-CM | POA: Diagnosis not present

## 2022-04-13 DIAGNOSIS — Z471 Aftercare following joint replacement surgery: Secondary | ICD-10-CM | POA: Diagnosis not present

## 2022-04-13 DIAGNOSIS — M19041 Primary osteoarthritis, right hand: Secondary | ICD-10-CM | POA: Diagnosis not present

## 2022-04-13 DIAGNOSIS — Z7982 Long term (current) use of aspirin: Secondary | ICD-10-CM | POA: Diagnosis not present

## 2022-04-13 DIAGNOSIS — R69 Illness, unspecified: Secondary | ICD-10-CM | POA: Diagnosis not present

## 2022-04-13 DIAGNOSIS — Z96612 Presence of left artificial shoulder joint: Secondary | ICD-10-CM | POA: Diagnosis not present

## 2022-04-13 DIAGNOSIS — Z9181 History of falling: Secondary | ICD-10-CM | POA: Diagnosis not present

## 2022-04-16 DIAGNOSIS — Z7982 Long term (current) use of aspirin: Secondary | ICD-10-CM | POA: Diagnosis not present

## 2022-04-16 DIAGNOSIS — E7800 Pure hypercholesterolemia, unspecified: Secondary | ICD-10-CM | POA: Diagnosis not present

## 2022-04-16 DIAGNOSIS — Z86010 Personal history of colonic polyps: Secondary | ICD-10-CM | POA: Diagnosis not present

## 2022-04-16 DIAGNOSIS — Z96652 Presence of left artificial knee joint: Secondary | ICD-10-CM | POA: Diagnosis not present

## 2022-04-16 DIAGNOSIS — R69 Illness, unspecified: Secondary | ICD-10-CM | POA: Diagnosis not present

## 2022-04-16 DIAGNOSIS — Z471 Aftercare following joint replacement surgery: Secondary | ICD-10-CM | POA: Diagnosis not present

## 2022-04-16 DIAGNOSIS — Z853 Personal history of malignant neoplasm of breast: Secondary | ICD-10-CM | POA: Diagnosis not present

## 2022-04-16 DIAGNOSIS — E039 Hypothyroidism, unspecified: Secondary | ICD-10-CM | POA: Diagnosis not present

## 2022-04-16 DIAGNOSIS — Z96612 Presence of left artificial shoulder joint: Secondary | ICD-10-CM | POA: Diagnosis not present

## 2022-04-16 DIAGNOSIS — I10 Essential (primary) hypertension: Secondary | ICD-10-CM | POA: Diagnosis not present

## 2022-04-16 DIAGNOSIS — K219 Gastro-esophageal reflux disease without esophagitis: Secondary | ICD-10-CM | POA: Diagnosis not present

## 2022-04-16 DIAGNOSIS — M19041 Primary osteoarthritis, right hand: Secondary | ICD-10-CM | POA: Diagnosis not present

## 2022-04-16 DIAGNOSIS — Z9181 History of falling: Secondary | ICD-10-CM | POA: Diagnosis not present

## 2022-04-16 DIAGNOSIS — R7303 Prediabetes: Secondary | ICD-10-CM | POA: Diagnosis not present

## 2022-04-16 DIAGNOSIS — Z79811 Long term (current) use of aromatase inhibitors: Secondary | ICD-10-CM | POA: Diagnosis not present

## 2022-04-16 DIAGNOSIS — G8929 Other chronic pain: Secondary | ICD-10-CM | POA: Diagnosis not present

## 2022-04-17 DIAGNOSIS — M1712 Unilateral primary osteoarthritis, left knee: Secondary | ICD-10-CM | POA: Diagnosis not present

## 2022-04-17 NOTE — Therapy (Deleted)
OUTPATIENT PHYSICAL THERAPY LOWER EXTREMITY EVALUATION   Patient Name: Breanna Hill MRN: [id number] DOB:1940/07/07, 82 y.o., female Today's Date: 04/17/2022  END OF SESSION:   Past Medical History:  Diagnosis Date   Arthritis    R hand    Breast cancer    left   Breast disorder    cancer   Cancer    breast CA- diagnosed with needle biopsy   Depression    GERD (gastroesophageal reflux disease)    rare use of tums    High cholesterol 10/17/2015   History of radiation therapy 03/27/17- 04/23/17   Left Breast, 2.67 Gy in 15 fractions for a total dose of 40.05 Gy. Boost, 2 Gy in 5 fractions for a total dose of 10 Gy.    Hypertension    Hypothyroidism    Pre-diabetes    Hgba1C- in the 6 's , per pt., she monitors her diet   Past Surgical History:  Procedure Laterality Date   BACK SURGERY  1973   BREAST BIOPSY Left 02/04/2017   BREAST BIOPSY Right 11/29/2021   MM RT BREAST BX W LOC DEV 1ST LESION IMAGE BX SPEC STEREO GUIDE 11/29/2021 [facility]   BREAST LUMPECTOMY Left 02/2017   BREAST LUMPECTOMY WITH RADIOACTIVE SEED AND SENTINEL LYMPH NODE BIOPSY Left 02/27/2017   Procedure: LEFT BREAST LUMPECTOMY WITH RADIOACTIVE SEED AND LEFT AXILLARY SENTINEL LYMPH NODE BIOPSY;  Surgeon: Manus Rudd, MD;  Location: [facility];  Service: General;  Laterality: Left;   COLONOSCOPY     X 2   COLONOSCOPY N/A 02/22/2016   Procedure: COLONOSCOPY;  Surgeon: Malissa Hippo, MD;  Location: [facility];  Service: Endoscopy;  Laterality: N/A;  1:45   EYE SURGERY Bilateral    phlebpheroplasty   Left shoultder surgery     for bone spurs & frozen shoulder    REVERSE SHOULDER ARTHROPLASTY Left 09/14/2020   Procedure: REVERSE SHOULDER ARTHROPLASTY;  Surgeon: Bjorn Pippin, MD;  Location: [facility];  Service: Orthopedics;  Laterality: Left;   Right bunionectomy     TONSILLECTOMY     TOTAL KNEE ARTHROPLASTY Left 04/02/2022   Procedure: TOTAL KNEE ARTHROPLASTY;  Surgeon: [name], MD;   Location: [facility];  Service: Orthopedics;  Laterality: Left;   TUBAL LIGATION     Patient Active Problem List   Diagnosis Date Noted   Primary osteoarthritis of left knee 04/02/2022   Malignant neoplasm of upper-inner quadrant of left breast in female, estrogen receptor positive 03/05/2017   History of colonic polyps 10/31/2015   High cholesterol 10/17/2015   SHOULDER, ARTHRITIS, DEGEN./OSTEO 02/28/2009   CONTRACTURE OF SHOULDER JOINT 02/16/2009   SHOULDER PAIN 02/16/2009    PCP: Carylon Perches  REFERRING PROVIDER: [name], MD  REFERRING DIAG: S/P lt total knee replacement DOS 04/02/2022 please schedule therapy on 04/18/2022  THERAPY DIAG:  Left knee stiffness Edema Decreased strength Left knee pain  Rationale for Evaluation and Treatment: Rehabilitation  ONSET DATE: 04/02/22  SUBJECTIVE STATEMENT: ***  PERTINENT HISTORY: OA, HTN, back surgery, reverse Lt shoulder replacement, breast lumpectomy PAIN:  Are you having pain? Yes: NPRS scale: ***/10 Pain location: *** Pain description: *** Aggravating factors: *** Relieving factors: ***  PRECAUTIONS: None  WEIGHT BEARING RESTRICTIONS: No  FALLS:  Has patient fallen in last 6 months? No  LIVING ENVIRONMENT: Lives with: lives with their family Lives in: House/apartment Stairs: Yes: {Stairs:24000} Has following equipment at home: {Assistive devices:23999}  OCCUPATION: retired  PLOF: Independent  PATIENT GOALS: ***  NEXT MD VISIT: ***  OBJECTIVE:   PATIENT SURVEYS:  FOTO ***  COGNITION: Overall cognitive status: Within functional limits for tasks assessed     EDEMA:  {edema:24020}  LOWER EXTREMITY ROM:  Active ROM Right eval Left eval  Hip flexion    Hip extension    Hip abduction    Hip adduction    Hip internal rotation    Hip external rotation    Knee flexion    Knee extension    Ankle dorsiflexion    Ankle plantarflexion    Ankle inversion    Ankle eversion     (Blank rows =  not tested)  LOWER EXTREMITY MMT:  MMT Right eval Left eval  Hip flexion    Hip extension    Hip abduction    Hip adduction    Hip internal rotation    Hip external rotation    Knee flexion    Knee extension    Ankle dorsiflexion    Ankle plantarflexion    Ankle inversion    Ankle eversion     (Blank rows = not tested)  FUNCTIONAL TESTS:  30 seconds chair stand test 2 minute walk test: *** Single leg stance     TODAY'S TREATMENT:                                                                                                                              DATE: ***    PATIENT EDUCATION:  Education details: HEP Person educated: Patient Education method: Chief Technology Officer Education comprehension: returned demonstration  HOME EXERCISE PROGRAM: ***  ASSESSMENT:  CLINICAL IMPRESSION: Patient is a 82 y.o. female who was seen today for physical therapy evaluation and treatment for LT total knee replacement.  Ms. Desilva exhibits decreased activity tolerance, decreased ROM, decreased strength, decreased balance, increased edema and increased pain.  She will benefit from skilled PT to address these issues and maximize her functional ability   OBJECTIVE IMPAIRMENTS: decreased activity tolerance, decreased balance, difficulty walking, decreased ROM, decreased strength, increased edema, increased fascial restrictions, and pain.   ACTIVITY LIMITATIONS: carrying, lifting, bending, sitting, standing, squatting, sleeping, stairs, and locomotion level  PARTICIPATION LIMITATIONS: meal prep, cleaning, laundry, shopping, and community activity  PERSONAL FACTORS: Age, Fitness, and Time since onset of injury/illness/exacerbation are also affecting patient's functional outcome.   REHAB POTENTIAL: Good  CLINICAL DECISION MAKING: Stable/uncomplicated  EVALUATION COMPLEXITY: Low   GOALS: Goals reviewed with patient? No  SHORT TERM GOALS: Target date: 05/09/22 PT to be I in  HEP in order to decrease their pain to no greater than a  Baseline: Goal status: {GOALSTATUS:25110}  2.  Pt ROM to be increased to   in order to  Baseline:  Goal status: {GOALSTATUS:25110}  3.  PT LE strength to increased 1/2 grade to allow pt to be walking with  Baseline:  Goal status: {GOALSTATUS:25110}  4.  PT to be able to be up walking/standing for  minutes to be able to complete housework  Baseline:  Goal status: {GOALSTATUS:25110}  5.  *** Baseline:  Goal status: {GOALSTATUS:25110}  6.  *** Baseline:  Goal status: {GOALSTATUS:25110}  LONG TERM GOALS: Target date: 05/30/22  PT to be I in HEP in order to decrease their pain to no greater than a  Baseline:  Goal status: {GOALSTATUS:25110}  2.   Pt ROM to be increased to   in order to  Baseline:  Goal status: {GOALSTATUS:25110}  3.  PT LE strength to increased 1/2 grade to allow pt to be walking with  Baseline:  Goal status: {GOALSTATUS:25110}  4.   PT to be able to be up walking/standing for     minutes to be able to complete housework  Baseline:  Baseline:  Goal status: {GOALSTATUS:25110}  5.  *** Baseline:  Goal status: {GOALSTATUS:25110}  6.  *** Baseline:  Goal status: {GOALSTATUS:25110}   PLAN:  PT FREQUENCY: 3x/week  PT DURATION: 6 weeks  PLANNED INTERVENTIONS: Therapeutic exercises, Therapeutic activity, Neuromuscular re-education, Balance training, Gait training, Patient/Family education, Self Care, Joint mobilization, and Manual therapy  PLAN FOR NEXT SESSION: ***

## 2022-04-18 ENCOUNTER — Ambulatory Visit (HOSPITAL_COMMUNITY): Payer: Medicare HMO | Admitting: Physical Therapy

## 2022-04-18 DIAGNOSIS — M25662 Stiffness of left knee, not elsewhere classified: Secondary | ICD-10-CM | POA: Diagnosis not present

## 2022-04-18 DIAGNOSIS — R2689 Other abnormalities of gait and mobility: Secondary | ICD-10-CM | POA: Diagnosis not present

## 2022-04-18 DIAGNOSIS — M25562 Pain in left knee: Secondary | ICD-10-CM | POA: Diagnosis not present

## 2022-04-20 DIAGNOSIS — M25562 Pain in left knee: Secondary | ICD-10-CM | POA: Diagnosis not present

## 2022-04-20 DIAGNOSIS — R2689 Other abnormalities of gait and mobility: Secondary | ICD-10-CM | POA: Diagnosis not present

## 2022-04-20 DIAGNOSIS — M25662 Stiffness of left knee, not elsewhere classified: Secondary | ICD-10-CM | POA: Diagnosis not present

## 2022-04-23 DIAGNOSIS — M25562 Pain in left knee: Secondary | ICD-10-CM | POA: Diagnosis not present

## 2022-04-23 DIAGNOSIS — R2689 Other abnormalities of gait and mobility: Secondary | ICD-10-CM | POA: Diagnosis not present

## 2022-04-23 DIAGNOSIS — M25662 Stiffness of left knee, not elsewhere classified: Secondary | ICD-10-CM | POA: Diagnosis not present

## 2022-04-25 ENCOUNTER — Encounter (HOSPITAL_COMMUNITY): Payer: Self-pay | Admitting: *Deleted

## 2022-04-25 ENCOUNTER — Other Ambulatory Visit: Payer: Self-pay

## 2022-04-25 ENCOUNTER — Emergency Department (HOSPITAL_COMMUNITY)
Admission: EM | Admit: 2022-04-25 | Discharge: 2022-04-25 | Disposition: A | Discharge status: Home / Self Care | Payer: Medicare HMO | Attending: Emergency Medicine | Admitting: Emergency Medicine

## 2022-04-25 DIAGNOSIS — Z77098 Contact with and (suspected) exposure to other hazardous, chiefly nonmedicinal, chemicals: Secondary | ICD-10-CM | POA: Diagnosis not present

## 2022-04-25 DIAGNOSIS — Z79899 Other long term (current) drug therapy: Secondary | ICD-10-CM | POA: Diagnosis not present

## 2022-04-25 DIAGNOSIS — Z853 Personal history of malignant neoplasm of breast: Secondary | ICD-10-CM | POA: Diagnosis not present

## 2022-04-25 DIAGNOSIS — Z7982 Long term (current) use of aspirin: Secondary | ICD-10-CM | POA: Diagnosis not present

## 2022-04-25 DIAGNOSIS — T6591XA Toxic effect of unspecified substance, accidental (unintentional), initial encounter: Secondary | ICD-10-CM | POA: Diagnosis not present

## 2022-04-25 DIAGNOSIS — H5712 Ocular pain, left eye: Secondary | ICD-10-CM | POA: Diagnosis not present

## 2022-04-25 DIAGNOSIS — H10212 Acute toxic conjunctivitis, left eye: Secondary | ICD-10-CM | POA: Diagnosis not present

## 2022-04-25 DIAGNOSIS — E039 Hypothyroidism, unspecified: Secondary | ICD-10-CM | POA: Diagnosis not present

## 2022-04-25 DIAGNOSIS — I10 Essential (primary) hypertension: Secondary | ICD-10-CM | POA: Diagnosis not present

## 2022-04-25 MED ORDER — TETRACAINE HCL 0.5 % OP SOLN
2.0000 [drp] | Freq: Once | OPHTHALMIC | Status: AC
  Administered 2022-04-25: 2 [drp] via OPHTHALMIC
  Filled 2022-04-25: qty 4

## 2022-04-25 MED ORDER — ERYTHROMYCIN 5 MG/GM OP OINT: TOPICAL_OINTMENT | OPHTHALMIC | 0 refills | Status: DC

## 2022-04-25 MED ORDER — ACETAMINOPHEN 500 MG PO TABS
1000.0000 mg | ORAL_TABLET | Freq: Once | ORAL | Status: AC
  Administered 2022-04-25: 1000 mg via ORAL
  Filled 2022-04-25: qty 2

## 2022-04-25 NOTE — ED Provider Notes (Signed)
[place] EMERGENCY DEPARTMENT AT [facility] Provider Note   CSN: [id number] Arrival date & time: 04/25/22  4098     History  Chief Complaint  Patient presents with   Eye Pain    Breanna Hill is a 82 y.o. female with history of HLD, hypothyroidism, depression, prediabetes, GERD, breast cancer s/p radiation and lumpectomy, HTN who presents to the ER complaining of left eye pain. Patient states she accidentally put chlorhexidine-gluconate 4% into her left eye. Immediately put biogreen contact solution in afterwards. Complaining of severe pain and burning of the eye with redness.    Eye Pain       Home Medications Prior to Admission medications   Medication Sig Start Date End Date Taking? Authorizing Provider  erythromycin ophthalmic ointment Place a 1/2 inch ribbon of ointment into the lower left eyelid 4 times daily for 7 days. 04/25/22  Yes Micala Saltsman T, PA-C  acetaminophen (TYLENOL) 500 MG tablet Take 2 tablets (1,000 mg total) by mouth every 8 (eight) hours as needed. 04/03/22 05/03/22  [name], MD  Apoaequorin (PREVAGEN) 10 MG CAPS Take 10 mg by mouth in the morning.    [provider]  aspirin EC 81 MG tablet Take 1 tablet (81 mg total) by mouth in the morning and at bedtime. Swallow whole. 04/03/22 05/03/22  [name], MD  atorvastatin (LIPITOR) 20 MG tablet Take 20 mg by mouth in the morning. 01/22/14   [provider]  Calcium Carbonate-Vitamin D (CALCIUM 600+D PO) Take 1 tablet by mouth at bedtime.    [provider]  Cholecalciferol (VITAMIN D-3) 125 MCG (5000 UT) TABS Take 5,000 Units by mouth in the morning.    [provider]  diclofenac Sodium (VOLTAREN ARTHRITIS PAIN) 1 % GEL Apply 2 g topically in the morning. Knee    [provider]  GEMTESA 75 MG TABS TAKE 1 TABLET BY MOUTH AT 2PM Patient taking differently: Take 75 mg by mouth every Monday, Wednesday, and Friday. At bedtime 09/21/21    Marcine Matar, MD  letrozole [facility]) 2.5 MG tablet TAKE 1 TABLET BY MOUTH EVERY DAY 10/18/21   Serena Croissant, MD  levothyroxine (SYNTHROID) 88 MCG tablet Take 88 mcg by mouth daily before breakfast. 08/18/20   [provider]  mirtazapine (REMERON) 15 MG tablet Take 15 mg by mouth at bedtime. 02/05/22   [provider]  olmesartan (BENICAR) 20 MG tablet Take 20 mg by mouth daily. 01/17/22   [provider]  senna-docusate (SENOKOT-S) 8.6-50 MG tablet Take 1 tablet by mouth daily.    [provider]  trospium (SANCTURA) 20 MG tablet TAKE 1 TABLET BY MOUTH TWICE A DAY 12/12/21   Marcine Matar, MD  zolpidem (AMBIEN) 5 MG tablet Take 2.5 tablets (12.5 mg total) by mouth at bedtime as needed for sleep. Patient taking differently: Take 5 mg by mouth at bedtime. 02/17/21   Serena Croissant, MD      Allergies    Penicillins    Review of Systems   Review of Systems  Eyes:  Positive for pain and redness.  All other systems reviewed and are negative.   Physical Exam Updated Vital Signs BP (!) 144/73 (BP Location: Right Arm)   Pulse 88   Temp 97.7 F (36.5 C) (Oral)   Resp 18   SpO2 98%  Physical Exam Vitals and nursing note reviewed.  Constitutional:      Appearance: Normal appearance.  HENT:  Head: Normocephalic and atraumatic.  Eyes:     General: Lids are normal.     Extraocular Movements: Extraocular movements intact.     Conjunctiva/sclera:     Left eye: Left conjunctiva is injected.     Pupils: Pupils are equal, round, and reactive to light.  Pulmonary:     Effort: Pulmonary effort is normal. No respiratory distress.  Skin:    General: Skin is warm and dry.  Neurological:     Mental Status: She is alert.  Psychiatric:        Mood and Affect: Mood normal.        Behavior: Behavior normal.     ED Results / Procedures / Treatments   Labs (all labs ordered are listed, but only abnormal results are displayed) Labs Reviewed - No  data to display  EKG None  Radiology No results found.  Procedures Procedures    Medications Ordered in ED Medications  tetracaine (PONTOCAINE) 0.5 % ophthalmic solution 2 drop (2 drops Left Eye Given 04/25/22 1116)  acetaminophen (TYLENOL) tablet 1,000 mg (1,000 mg Oral Given 04/25/22 1116)    ED Course/ Medical Decision Making/ A&P                             Medical Decision Making Risk OTC drugs. Prescription drug management.  This patient is a 82 y.o. female who presents to the ED for concern of eye redness and burning after contact with chlorhexidine-gluconate solution.   Past Medical History / Social History / Additional history: Chart reviewed. Pertinent results include: HLD, hypothyroidism, depression, prediabetes, GERD, breast cancer s/p radiation and lumpectomy, HTN  Physical Exam: Physical exam performed. The pertinent findings include: left conjunctival injection without chemosis or exudate, no clouding of the cornea, PERRLA  Medications / Treatment: Tetracaine drops and tylenol given for discomfort. Eye thoroughly irrigated with 1 L of saline using the morgan lens. pH paper used to confirm normal pH of eye after irrigation. Confirmed with poison control they have no further recommendations.   Disposition: After consideration of the diagnostic results and the patients response to treatment, I feel that emergency department workup does not suggest an emergent condition requiring admission or immediate intervention beyond what has been performed at this time. The plan is: discharge to home with symptomatic management of chemical conjunctivitis. Patient's pain is improved. pH of the eye is normal. Will provide with erythromycin antibiotic ointment and strongly encourage ophthalmology follow up. Patient plans to call her eye doctor upon discharge. The patient is safe for discharge and has been instructed to return immediately for worsening symptoms, change in symptoms or  any other concerns.  Final Clinical Impression(s) / ED Diagnoses Final diagnoses:  Chemical exposure of eye  Chemical conjunctivitis of left eye    Rx / DC Orders ED Discharge Orders          Ordered    erythromycin ophthalmic ointment        04/25/22 1150           Portions of this report may have been transcribed using voice recognition software. Every effort was made to ensure accuracy; however, inadvertent computerized transcription errors may be present.    Jeanella Flattery 04/25/22 1159    Pricilla Loveless, MD 04/25/22 1529

## 2022-04-25 NOTE — ED Triage Notes (Signed)
Pt accidentally put chlorhexidine-gluconate 4% in her left eye  Pt immediately put bio-green contact solution in her eye  Upon arrival to ED, triage nurse flushed eye with 80ml of NS  Pt c/o pain and blurry vision; eye is red

## 2022-04-25 NOTE — ED Notes (Signed)
Pt eye being washed out, left eye. Pt requested to call daughter at work. Pt on the phone now with daughter.

## 2022-04-25 NOTE — Discharge Instructions (Addendum)
You were seen in the ER for eye irritation after exposure to chemical.  We have thoroughly rinsed your eye and I am prescribing you some antibiotics to prevent any infection. This is an ointment you will use a Qtip and place a small amount to your lower left eyelid, just behind the lower water line.  Please call your eye doctor as soon as you get home to schedule a follow up appointment.  Continue to monitor how you're doing and return to the ER for new or worsening symptoms.

## 2022-04-25 NOTE — ED Triage Notes (Signed)
Morgan lens applied to left eye.

## 2022-04-26 ENCOUNTER — Ambulatory Visit (INDEPENDENT_AMBULATORY_CARE_PROVIDER_SITE_OTHER): Payer: Medicare HMO | Admitting: Gastroenterology

## 2022-04-26 ENCOUNTER — Encounter: Payer: Self-pay | Admitting: Gastroenterology

## 2022-04-26 VITALS — BP 106/58 | HR 83 | Temp 98.2°F | Ht 62.0 in | Wt 140.0 lb

## 2022-04-26 DIAGNOSIS — K640 First degree hemorrhoids: Secondary | ICD-10-CM

## 2022-04-26 DIAGNOSIS — R197 Diarrhea, unspecified: Secondary | ICD-10-CM

## 2022-04-26 DIAGNOSIS — K5900 Constipation, unspecified: Secondary | ICD-10-CM

## 2022-04-26 NOTE — Progress Notes (Addendum)
GI Office Note    Referring Provider: Carylon Perches, MD Primary Care Physician:  Carylon Perches, MD Primary Gastroenterologist: Dolores Frame, MD  Date:  04/26/2022  ID:  Breanna Hill, DOB [birth year], MRN [id number]   Chief Complaint   Chief Complaint  Patient presents with   Follow-up    Doing well, no issues.   History of Present Illness  Breanna Hill is a 82 y.o. female with a history of arthritis, breast cancer, depression, GERD, HTN, HLD, hypothyroidism, and prediabetes presenting today for follow up of constipation.   Last office visit 01/25/22. Increasing constipation. Took dulcolax and 3-4 days of other laxatives. Fiber helping some. Felt as though rectum is tight and has to pick at her stools. Going everyday with some watery stools. Reoprts she is okay with diarrhea as long as she is not constipated. Had tried daughters linzess and was helpful but too strong. No issues with hemorrhoids. No abdominal pain. Advised to try Linzess 72 mcg daily. Increase water intake. Continue metamucil daily.   On follow up she reported Linzess 72 mcg daily was too strong.   Today:  States she accidentally put cleaner in her eye instead of her eye drops. Her vision is better today and she is using antibiotic cream for her eye.  She reports she has found out that the only things that helps is increased water intake. As long as she stays hydrated she has not had an issues. She has had one bout of constipation. She has some liquid BM at times but she has good control. Stools not watery but is looser. She is able to see remnants of fruits at times. No need to strain and not having to pick apart hard stools. No abdominal pain. No melena or brbpr. Still take 1-2 senna daily and has stopped metamucil. She did take a dose of miralax last night. She reports not being good at eating regularly. Does not have much of an appetite and is working on losing weight and has lost 20 lbs. Is making an effort  to cut back on carbs and settle more on fruit. She feels as though she is getting enough protein.   No issues with her hemorrhoids. No symptoms currently.   Occasional indigestion but nothing frequent. No n/v, dysphagia.    Current Outpatient Medications  Medication Sig Dispense Refill   acetaminophen (TYLENOL) 500 MG tablet Take 2 tablets (1,000 mg total) by mouth every 8 (eight) hours as needed. 90 tablet 0   Apoaequorin (PREVAGEN) 10 MG CAPS Take 10 mg by mouth in the morning.     aspirin EC 81 MG tablet Take 1 tablet (81 mg total) by mouth in the morning and at bedtime. Swallow whole. 60 tablet 0   atorvastatin (LIPITOR) 20 MG tablet Take 20 mg by mouth in the morning.     Calcium Carbonate-Vitamin D (CALCIUM 600+D PO) Take 1 tablet by mouth at bedtime.     Cholecalciferol (VITAMIN D-3) 125 MCG (5000 UT) TABS Take 5,000 Units by mouth in the morning.     diclofenac Sodium (VOLTAREN ARTHRITIS PAIN) 1 % GEL Apply 2 g topically in the morning. Knee     erythromycin ophthalmic ointment Place a 1/2 inch ribbon of ointment into the lower left eyelid 4 times daily for 7 days. 3.5 g 0   GEMTESA 75 MG TABS TAKE 1 TABLET BY MOUTH AT 2PM (Patient taking differently: Take 75 mg by mouth every Monday, Wednesday, and Friday. At bedtime) 90  tablet 3   letrozole (FEMARA) 2.5 MG tablet TAKE 1 TABLET BY MOUTH EVERY DAY 90 tablet 3   levothyroxine (SYNTHROID) 88 MCG tablet Take 88 mcg by mouth daily before breakfast.     mirtazapine (REMERON) 15 MG tablet Take 15 mg by mouth at bedtime.     olmesartan (BENICAR) 20 MG tablet Take 20 mg by mouth daily.     senna-docusate (SENOKOT-S) 8.6-50 MG tablet Take 1 tablet by mouth daily.     trospium (SANCTURA) 20 MG tablet TAKE 1 TABLET BY MOUTH TWICE A DAY 180 tablet 3   zolpidem (AMBIEN) 5 MG tablet Take 2.5 tablets (12.5 mg total) by mouth at bedtime as needed for sleep. (Patient taking differently: Take 5 mg by mouth at bedtime.) 30 tablet 0   No current  facility-administered medications for this visit.    Past Medical History:  Diagnosis Date   Arthritis    R hand    Breast cancer    left   Breast disorder    cancer   Cancer    breast CA- diagnosed with needle biopsy   Depression    GERD (gastroesophageal reflux disease)    rare use of tums    High cholesterol 10/17/2015   History of radiation therapy 03/27/17- 04/23/17   Left Breast, 2.67 Gy in 15 fractions for a total dose of 40.05 Gy. Boost, 2 Gy in 5 fractions for a total dose of 10 Gy.    Hypertension    Hypothyroidism    Pre-diabetes    Hgba1C- in the 6 's , per pt., she monitors her diet    Past Surgical History:  Procedure Laterality Date   BACK SURGERY  1973   BREAST BIOPSY Left 02/04/2017   BREAST BIOPSY Right 11/29/2021   MM RT BREAST BX W LOC DEV 1ST LESION IMAGE BX SPEC STEREO GUIDE 11/29/2021 [facility]   BREAST LUMPECTOMY Left 02/2017   BREAST LUMPECTOMY WITH RADIOACTIVE SEED AND SENTINEL LYMPH NODE BIOPSY Left 02/27/2017   Procedure: LEFT BREAST LUMPECTOMY WITH RADIOACTIVE SEED AND LEFT AXILLARY SENTINEL LYMPH NODE BIOPSY;  Surgeon: Manus Rudd, MD;  Location: [facility];  Service: General;  Laterality: Left;   COLONOSCOPY     X 2   COLONOSCOPY N/A 02/22/2016   Procedure: COLONOSCOPY;  Surgeon: Malissa Hippo, MD;  Location: [facility];  Service: Endoscopy;  Laterality: N/A;  1:45   EYE SURGERY Bilateral    phlebpheroplasty   Left shoultder surgery     for bone spurs & frozen shoulder    REVERSE SHOULDER ARTHROPLASTY Left 09/14/2020   Procedure: REVERSE SHOULDER ARTHROPLASTY;  Surgeon: Bjorn Pippin, MD;  Location: [facility];  Service: Orthopedics;  Laterality: Left;   Right bunionectomy     TONSILLECTOMY     TOTAL KNEE ARTHROPLASTY Left 04/02/2022   Procedure: TOTAL KNEE ARTHROPLASTY;  Surgeon: [name], MD;  Location: [facility];  Service: Orthopedics;  Laterality: Left;   TUBAL LIGATION      Family History  Problem Relation Age of  Onset   Colon cancer Neg Hx     Allergies as of 04/26/2022 - Review Complete 04/26/2022  Allergen Reaction Noted   Penicillins Rash     Social History   Socioeconomic History   Marital status: Widowed    Spouse name: Not on file   Number of children: 2   Years of education: 101   Highest education level: 12th grade  Occupational History   Not on file  Tobacco Use   Smoking status: Never    Passive exposure: Never   Smokeless tobacco: Never  Vaping Use   Vaping Use: Never used  Substance and Sexual Activity   Alcohol use: Yes    Comment: occasional   Drug use: No   Sexual activity: Not Currently    Partners: Male    Birth control/protection: Post-menopausal  Other Topics Concern   Not on file  Social History Narrative   Not on file   Social Determinants of Health   Financial Resource Strain: Low Risk  (08/10/2021)   Overall Financial Resource Strain (CARDIA)    Difficulty of Paying Living Expenses: Not hard at all  Food Insecurity: No Food Insecurity (04/02/2022)   Hunger Vital Sign    Worried About Running Out of Food in the Last Year: Never true    Ran Out of Food in the Last Year: Never true  Transportation Needs: No Transportation Needs (04/02/2022)   PRAPARE - Administrator, Civil Service (Medical): No    Lack of Transportation (Non-Medical): No  Physical Activity: Inactive (08/10/2021)   Exercise Vital Sign    Days of Exercise per Week: 0 days    Minutes of Exercise per Session: 0 min  Stress: No Stress Concern Present (08/10/2021)   [employer] of Occupational Health - Occupational Stress Questionnaire    Feeling of Stress : Only a little  Social Connections: Moderately Integrated (08/10/2021)   Social Connection and Isolation Panel [NHANES]    Frequency of Communication with Friends and Family: More than three times a week    Frequency of Social Gatherings with Friends and Family: More than three times a week    Attends Religious Services:  Never    Database administrator or Organizations: Yes    Attends Engineer, structural: More than 4 times per year    Marital Status: Married     Review of Systems   Gen: Denies fever, chills, anorexia. Denies fatigue, weakness, weight loss.  CV: Denies chest pain, palpitations, syncope, peripheral edema, and claudication. Resp: Denies dyspnea at rest, cough, wheezing, coughing up blood, and pleurisy. GI: See HPI Derm: Denies rash, itching, dry skin Psych: Denies depression, anxiety, memory loss, confusion. No homicidal or suicidal ideation.  Heme: Denies bruising, bleeding, and enlarged lymph nodes.   Physical Exam   BP (!) 106/58 (BP Location: Right Arm, Patient Position: Sitting, Cuff Size: Normal)   Pulse 83   Temp 98.2 F (36.8 C) (Oral)   Ht  (1.575 m)   Wt 140 lb (63.5 kg)   SpO2 97%   BMI 25.61 kg/m   General:   Alert and oriented. No distress noted. Pleasant and cooperative.  Head:  Normocephalic and atraumatic. Eyes:  Conjuctiva clear without scleral icterus. Mouth:  Oral mucosa pink and moist. Good dentition. No lesions. Lungs:  Clear to auscultation bilaterally. No wheezes, rales, or rhonchi. No distress.  Heart:  S1, S2 present without murmurs appreciated.  Abdomen:  +BS, soft, non-tender and non-distended. No rebound or guarding. No HSM or masses noted. Rectal: deferred Msk: abnormal gait, using single point cane due to recent knee replacement to L knee.  Extremities:  Without edema. Neurologic:  Alert and  oriented x4 Psych:  Alert and cooperative. Normal mood and affect.   Assessment  Breanna Hill is a 82 y.o. female with a history of arthritis, breast cancer, depression, GERD, HTN, HLD, hypothyroidism, and prediabetes presenting today for follow up of  constipation.   Constipation: Significantly improved with hydration and 1-2 senna/colace combo daily. No longer tacking fiber supplements. Has occasional mushy/loose stools but she is  content with this. No abdominal pain or bloating. No hard stools.  Hemorrhoids: Currently without any symptoms. No pain or itching and no brbpr.   PLAN   Continue adequate water intake.  Continue senna 1-2 times daily.  Continue to follow high fiber diet with fruits and vegetables.  Follow up in 6 months.    Brooke Bonito, MSN, FNP-BC, AGACNP-BC [facility]  I have reviewed the note and agree with the APP's assessment as described in this progress note  Katrinka Blazing, MD Gastroenterology and Hepatology [facility]

## 2022-04-26 NOTE — Patient Instructions (Signed)
Please ensure you are staying hydrated, drinking at least 2-3 bottles of water daily.  Continue high fiber diet with your fruits and vegetables.  You may continue 1-2 senna/colace combo daily. If you are having more loose stools you can cut back to one nightly.   We will follow up in 6 months, sooner if needed. Don't hesitate to call if you have any new concerns.   It was a pleasure to see you today. I want to create trusting relationships with patients. If you receive a survey regarding your visit,  I greatly appreciate you taking time to fill this out on paper or through your MyChart. I value your feedback.  Brooke Bonito, MSN, FNP-BC, AGACNP-BC [facility]

## 2022-04-27 DIAGNOSIS — M25662 Stiffness of left knee, not elsewhere classified: Secondary | ICD-10-CM | POA: Diagnosis not present

## 2022-04-27 DIAGNOSIS — M25562 Pain in left knee: Secondary | ICD-10-CM | POA: Diagnosis not present

## 2022-04-27 DIAGNOSIS — R2689 Other abnormalities of gait and mobility: Secondary | ICD-10-CM | POA: Diagnosis not present

## 2022-05-01 ENCOUNTER — Encounter: Payer: Self-pay | Admitting: Urology

## 2022-05-01 ENCOUNTER — Telehealth: Payer: Self-pay

## 2022-05-01 ENCOUNTER — Ambulatory Visit (INDEPENDENT_AMBULATORY_CARE_PROVIDER_SITE_OTHER): Payer: Medicare HMO | Admitting: Urology

## 2022-05-01 VITALS — BP 108/64 | HR 88

## 2022-05-01 DIAGNOSIS — R3915 Urgency of urination: Secondary | ICD-10-CM

## 2022-05-01 DIAGNOSIS — R350 Frequency of micturition: Secondary | ICD-10-CM | POA: Diagnosis not present

## 2022-05-01 DIAGNOSIS — N3281 Overactive bladder: Secondary | ICD-10-CM

## 2022-05-01 LAB — BLADDER SCAN AMB NON-IMAGING: Scan Result: 82

## 2022-05-01 NOTE — Progress Notes (Signed)
History of Present Illness: Breanna Hill is a 82 y.o. year old female here for follow-up of overactive bladder.  She has been on tolterodine and Gemtesa.  Despite this, she is still having bothersome day and nighttime frequency, about every hour.  Past Medical History:  Diagnosis Date   Arthritis    R hand    Breast cancer (HCC)    left   Breast disorder    cancer   Cancer (HCC)    breast CA- diagnosed with needle biopsy   Depression    GERD (gastroesophageal reflux disease)    rare use of tums    High cholesterol 10/17/2015   History of radiation therapy 03/27/17- 04/23/17   Left Breast, 2.67 Gy in 15 fractions for a total dose of 40.05 Gy. Boost, 2 Gy in 5 fractions for a total dose of 10 Gy.    Hypertension    Hypothyroidism    Pre-diabetes    Hgba1C- in the 6 's , per pt., she monitors her diet    Past Surgical History:  Procedure Laterality Date   BACK SURGERY  1973   BREAST BIOPSY Left 02/04/2017   BREAST BIOPSY Right 11/29/2021   MM RT BREAST BX W LOC DEV 1ST LESION IMAGE BX SPEC STEREO GUIDE 11/29/2021 [facility]   BREAST LUMPECTOMY Left 02/2017   BREAST LUMPECTOMY WITH RADIOACTIVE SEED AND SENTINEL LYMPH NODE BIOPSY Left 02/27/2017   Procedure: LEFT BREAST LUMPECTOMY WITH RADIOACTIVE SEED AND LEFT AXILLARY SENTINEL LYMPH NODE BIOPSY;  Surgeon: Manus Rudd, MD;  Location: [facility];  Service: General;  Laterality: Left;   COLONOSCOPY     X 2   COLONOSCOPY N/A 02/22/2016   Procedure: COLONOSCOPY;  Surgeon: Malissa Hippo, MD;  [facility];  Service: Endoscopy;  Laterality: N/A;  1:45   EYE SURGERY Bilateral    phlebpheroplasty   Left shoultder surgery     for bone spurs & frozen shoulder    REVERSE SHOULDER ARTHROPLASTY Left 09/14/2020   Procedure: REVERSE SHOULDER ARTHROPLASTY;  Surgeon: Bjorn Pippin, MD;  Location: [facility];  Service: Orthopedics;  Laterality: Left;   Right bunionectomy     TONSILLECTOMY     TOTAL KNEE ARTHROPLASTY Left  04/02/2022   Procedure: TOTAL KNEE ARTHROPLASTY;  Surgeon: [name], MD;  Location: [facility];  Service: Orthopedics;  Laterality: Left;   TUBAL LIGATION      Home Medications:  (Not in a hospital admission)   Allergies:  Allergies  Allergen Reactions   Penicillins Rash    Has patient had a PCN reaction causing immediate rash, facial/tongue/throat swelling, SOB or lightheadedness with hypotension: No Has patient had a PCN reaction causing severe rash involving mucus membranes or skin necrosis: No Has patient had a PCN reaction that required hospitalization: No Has patient had a PCN reaction occurring within the last 10 years: No If all of the above answers are "NO", then may proceed with Cephalosporin use.     Family History  Problem Relation Age of Onset   Colon cancer Neg Hx     Social History:  reports that she has never smoked. She has never been exposed to tobacco smoke. She has never used smokeless tobacco. She reports current alcohol use. She reports that she does not use drugs.  ROS: A complete review of systems was performed.  All systems are negative except for pertinent findings as noted.  Physical Exam:  Vital signs in last 24 hours: @VSRANGES @ General:  Alert and oriented, No acute distress HEENT: Normocephalic, atraumatic Neck: No JVD or lymphadenopathy Cardiovascular: Regular rate  Lungs: Normal inspiratory/expiratory excursion Abdomen: Soft, nontender, nondistended, no abdominal masses Back: No CVA tenderness Extremities: No edema Neurologic: Grossly intact  I have reviewed prior pt notes  I have reviewed notes from referring/previous physicians  I have reviewed urinalysis results-clear  Impression/Assessment:  Overactive bladder, quite bothersome despite dual medical therapy  Plan:  I discussed PTNS with this lady.  She is interested.  We will get that set up for a 12-week course  I will have her come back in 3 to 4 months to recheck  symptoms  She was given Gemtesa samples.  Bertram Millard Shadrack Brummitt 05/01/2022, 1:28 PM  Bertram Millard. Abrie Egloff MD

## 2022-05-01 NOTE — Telephone Encounter (Signed)
Transition Care Management Follow-up Telephone Call Date of discharge and from where: 04/25/2022 Breanna Hill  How have you been since you were released from the hospital? Doing fine and vision has returned Any questions or concerns? No  Items Reviewed: Did the pt receive and understand the discharge instructions provided? Yes  Medications obtained and verified? Yes  Other? No  Any new allergies since your discharge? No  Dietary orders reviewed? No Do you have support at home? No  lives alone but son lives next door   Follow up appointments reviewed:  PCP Hospital f/u appt confirmed? No  Scheduled to see  on  @ . Specialist Hospital f/u appt confirmed? Yes  Scheduled to [facility] DR[facility] Cotter  [facility] Are transportation arrangements needed? No  If their condition worsens, is the pt aware to call PCP or go to the Emergency Dept.? Yes Was the patient provided with contact information for the PCP's office or ED? Yes Was to pt encouraged to call back with questions or concerns? Yes

## 2022-05-01 NOTE — Progress Notes (Signed)
Pt here today for bladder scan. Bladder was scanned and 82 was visualized.    Performed by Kennyth Lose, CMA

## 2022-05-02 DIAGNOSIS — M25662 Stiffness of left knee, not elsewhere classified: Secondary | ICD-10-CM | POA: Diagnosis not present

## 2022-05-02 DIAGNOSIS — R2689 Other abnormalities of gait and mobility: Secondary | ICD-10-CM | POA: Diagnosis not present

## 2022-05-02 DIAGNOSIS — M25562 Pain in left knee: Secondary | ICD-10-CM | POA: Diagnosis not present

## 2022-05-04 DIAGNOSIS — M25562 Pain in left knee: Secondary | ICD-10-CM | POA: Diagnosis not present

## 2022-05-04 DIAGNOSIS — R2689 Other abnormalities of gait and mobility: Secondary | ICD-10-CM | POA: Diagnosis not present

## 2022-05-04 DIAGNOSIS — M25662 Stiffness of left knee, not elsewhere classified: Secondary | ICD-10-CM | POA: Diagnosis not present

## 2022-05-07 DIAGNOSIS — R2689 Other abnormalities of gait and mobility: Secondary | ICD-10-CM | POA: Diagnosis not present

## 2022-05-07 DIAGNOSIS — M25662 Stiffness of left knee, not elsewhere classified: Secondary | ICD-10-CM | POA: Diagnosis not present

## 2022-05-07 DIAGNOSIS — M25562 Pain in left knee: Secondary | ICD-10-CM | POA: Diagnosis not present

## 2022-05-09 DIAGNOSIS — M25662 Stiffness of left knee, not elsewhere classified: Secondary | ICD-10-CM | POA: Diagnosis not present

## 2022-05-09 DIAGNOSIS — M25562 Pain in left knee: Secondary | ICD-10-CM | POA: Diagnosis not present

## 2022-05-09 DIAGNOSIS — R2689 Other abnormalities of gait and mobility: Secondary | ICD-10-CM | POA: Diagnosis not present

## 2022-05-11 DIAGNOSIS — M25662 Stiffness of left knee, not elsewhere classified: Secondary | ICD-10-CM | POA: Diagnosis not present

## 2022-05-11 DIAGNOSIS — R2689 Other abnormalities of gait and mobility: Secondary | ICD-10-CM | POA: Diagnosis not present

## 2022-05-11 DIAGNOSIS — M25562 Pain in left knee: Secondary | ICD-10-CM | POA: Diagnosis not present

## 2022-05-14 DIAGNOSIS — M25562 Pain in left knee: Secondary | ICD-10-CM | POA: Diagnosis not present

## 2022-05-14 DIAGNOSIS — M25662 Stiffness of left knee, not elsewhere classified: Secondary | ICD-10-CM | POA: Diagnosis not present

## 2022-05-14 DIAGNOSIS — R2689 Other abnormalities of gait and mobility: Secondary | ICD-10-CM | POA: Diagnosis not present

## 2022-05-16 DIAGNOSIS — M25562 Pain in left knee: Secondary | ICD-10-CM | POA: Diagnosis not present

## 2022-05-16 DIAGNOSIS — M25662 Stiffness of left knee, not elsewhere classified: Secondary | ICD-10-CM | POA: Diagnosis not present

## 2022-05-16 DIAGNOSIS — R2689 Other abnormalities of gait and mobility: Secondary | ICD-10-CM | POA: Diagnosis not present

## 2022-05-17 DIAGNOSIS — M1712 Unilateral primary osteoarthritis, left knee: Secondary | ICD-10-CM | POA: Diagnosis not present

## 2022-05-18 ENCOUNTER — Ambulatory Visit: Payer: Medicare HMO

## 2022-05-18 DIAGNOSIS — M25662 Stiffness of left knee, not elsewhere classified: Secondary | ICD-10-CM | POA: Diagnosis not present

## 2022-05-18 DIAGNOSIS — R2689 Other abnormalities of gait and mobility: Secondary | ICD-10-CM | POA: Diagnosis not present

## 2022-05-18 DIAGNOSIS — M25562 Pain in left knee: Secondary | ICD-10-CM | POA: Diagnosis not present

## 2022-05-21 DIAGNOSIS — M25562 Pain in left knee: Secondary | ICD-10-CM | POA: Diagnosis not present

## 2022-05-21 DIAGNOSIS — M25662 Stiffness of left knee, not elsewhere classified: Secondary | ICD-10-CM | POA: Diagnosis not present

## 2022-05-21 DIAGNOSIS — R2689 Other abnormalities of gait and mobility: Secondary | ICD-10-CM | POA: Diagnosis not present

## 2022-05-22 ENCOUNTER — Ambulatory Visit: Payer: Medicare HMO

## 2022-05-23 DIAGNOSIS — M25562 Pain in left knee: Secondary | ICD-10-CM | POA: Diagnosis not present

## 2022-05-23 DIAGNOSIS — M25662 Stiffness of left knee, not elsewhere classified: Secondary | ICD-10-CM | POA: Diagnosis not present

## 2022-05-23 DIAGNOSIS — R2689 Other abnormalities of gait and mobility: Secondary | ICD-10-CM | POA: Diagnosis not present

## 2022-05-25 DIAGNOSIS — R2689 Other abnormalities of gait and mobility: Secondary | ICD-10-CM | POA: Diagnosis not present

## 2022-05-25 DIAGNOSIS — M25662 Stiffness of left knee, not elsewhere classified: Secondary | ICD-10-CM | POA: Diagnosis not present

## 2022-05-25 DIAGNOSIS — M25562 Pain in left knee: Secondary | ICD-10-CM | POA: Diagnosis not present

## 2022-05-30 DIAGNOSIS — M25562 Pain in left knee: Secondary | ICD-10-CM | POA: Diagnosis not present

## 2022-05-30 DIAGNOSIS — E1129 Type 2 diabetes mellitus with other diabetic kidney complication: Secondary | ICD-10-CM | POA: Diagnosis not present

## 2022-05-30 DIAGNOSIS — R2689 Other abnormalities of gait and mobility: Secondary | ICD-10-CM | POA: Diagnosis not present

## 2022-05-30 DIAGNOSIS — I10 Essential (primary) hypertension: Secondary | ICD-10-CM | POA: Diagnosis not present

## 2022-05-30 DIAGNOSIS — M25662 Stiffness of left knee, not elsewhere classified: Secondary | ICD-10-CM | POA: Diagnosis not present

## 2022-05-30 DIAGNOSIS — N1832 Chronic kidney disease, stage 3b: Secondary | ICD-10-CM | POA: Diagnosis not present

## 2022-05-30 DIAGNOSIS — Z79899 Other long term (current) drug therapy: Secondary | ICD-10-CM | POA: Diagnosis not present

## 2022-05-31 ENCOUNTER — Ambulatory Visit (HOSPITAL_COMMUNITY): Payer: Self-pay | Admitting: Emergency Medicine

## 2022-05-31 ENCOUNTER — Encounter (HOSPITAL_BASED_OUTPATIENT_CLINIC_OR_DEPARTMENT_OTHER): Payer: Self-pay | Admitting: Orthopedic Surgery

## 2022-05-31 DIAGNOSIS — M1712 Unilateral primary osteoarthritis, left knee: Secondary | ICD-10-CM | POA: Diagnosis not present

## 2022-05-31 DIAGNOSIS — M25562 Pain in left knee: Secondary | ICD-10-CM

## 2022-05-31 DIAGNOSIS — Z96659 Presence of unspecified artificial knee joint: Secondary | ICD-10-CM

## 2022-05-31 NOTE — H&P (View-Only) (Signed)
TOTAL KNEE MANIPULATION ADMISSION H&P  Patient is being admitted for closed left knee manipulation following recent total knee arthroplasty.  Subjective:  Chief Complaint:left knee post-operative stiffness.  HPI: Breanna Hill, 82 y.o. female, with a history of recent left total knee replacement on 04/02/2022 has had unfortunate post-operative stiffness of the same knee.  Physical therapy, elevation, and home exercises have not helped.  Patient reports difficulty with normal gait.  Onset of symptoms following surgery.  Patient was limited pre-operatively by limited range of motion as well.  Patient describes pain as manageable but has had continued issues with stiffness in the knee.  Uses cane for ambulation assistance. There is no active infection.  Patient Active Problem List   Diagnosis Date Noted   Primary osteoarthritis of left knee 04/02/2022   Malignant neoplasm of upper-inner quadrant of left breast in female, estrogen receptor positive (HCC) 03/05/2017   History of colonic polyps 10/31/2015   High cholesterol 10/17/2015   SHOULDER, ARTHRITIS, DEGEN./OSTEO 02/28/2009   CONTRACTURE OF SHOULDER JOINT 02/16/2009   SHOULDER PAIN 02/16/2009   Past Medical History:  Diagnosis Date   Ankylosis of left knee    Arthritis    R hand    Breast cancer (HCC)    left   Breast disorder    cancer   Cancer (HCC)    breast CA- diagnosed with needle biopsy   Depression    GERD (gastroesophageal reflux disease)    rare use of tums    High cholesterol 10/17/2015   History of radiation therapy 03/27/17- 04/23/17   Left Breast, 2.67 Gy in 15 fractions for a total dose of 40.05 Gy. Boost, 2 Gy in 5 fractions for a total dose of 10 Gy.    Hypertension    Hypothyroidism    Pre-diabetes    Hgba1C- in the 6 's , per pt., she monitors her diet    Past Surgical History:  Procedure Laterality Date   BACK SURGERY  1973   BREAST BIOPSY Left 02/04/2017   BREAST BIOPSY Right 11/29/2021   MM RT  BREAST BX W LOC DEV 1ST LESION IMAGE BX SPEC STEREO GUIDE 11/29/2021 [facility]   BREAST LUMPECTOMY Left 02/2017   BREAST LUMPECTOMY WITH RADIOACTIVE SEED AND SENTINEL LYMPH NODE BIOPSY Left 02/27/2017   Procedure: LEFT BREAST LUMPECTOMY WITH RADIOACTIVE SEED AND LEFT AXILLARY SENTINEL LYMPH NODE BIOPSY;  Surgeon: Tsuei, Matthew, MD;  Location: [facility];  Service: General;  Laterality: Left;   COLONOSCOPY     X 2   COLONOSCOPY N/A 02/22/2016   Procedure: COLONOSCOPY;  Surgeon: Najeeb U Rehman, MD;  Location: [facility];  Service: Endoscopy;  Laterality: N/A;  1:45   EYE SURGERY Bilateral    phlebpheroplasty   Left shoultder surgery     for bone spurs & frozen shoulder    REVERSE SHOULDER ARTHROPLASTY Left 09/14/2020   Procedure: REVERSE SHOULDER ARTHROPLASTY;  Surgeon: Varkey, Dax T, MD;  Location: [facility];  Service: Orthopedics;  Laterality: Left;   Right bunionectomy     TONSILLECTOMY     TOTAL KNEE ARTHROPLASTY Left 04/02/2022   Procedure: TOTAL KNEE ARTHROPLASTY;  Surgeon: Marchwiany, Daniel A, MD;  Location: [facility];  Service: Orthopedics;  Laterality: Left;   TUBAL LIGATION      Current Outpatient Medications  Medication Sig Dispense Refill Last Dose   Apoaequorin (PREVAGEN) 10 MG CAPS Take 10 mg by mouth in the morning.      atorvastatin (LIPITOR) 20 MG tablet Take 20   mg by mouth in the morning.      Calcium Carbonate-Vitamin D (CALCIUM 600+D PO) Take 1 tablet by mouth at bedtime.      Cholecalciferol (VITAMIN D-3) 125 MCG (5000 UT) TABS Take 5,000 Units by mouth in the morning.      diclofenac Sodium (VOLTAREN ARTHRITIS PAIN) 1 % GEL Apply 2 g topically in the morning. Knee (Patient not taking: Reported on 05/01/2022)      erythromycin ophthalmic ointment Place a 1/2 inch ribbon of ointment into the lower left eyelid 4 times daily for 7 days. 3.5 g 0    GEMTESA 75 MG TABS TAKE 1 TABLET BY MOUTH AT 2PM (Patient taking differently: Take 75 mg by mouth every Monday, Wednesday, and  Friday. At bedtime) 90 tablet 3    letrozole (FEMARA) 2.5 MG tablet TAKE 1 TABLET BY MOUTH EVERY DAY 90 tablet 3    levothyroxine (SYNTHROID) 88 MCG tablet Take 88 mcg by mouth daily before breakfast.      mirtazapine (REMERON) 15 MG tablet Take 15 mg by mouth at bedtime.      olmesartan (BENICAR) 20 MG tablet Take 20 mg by mouth daily.      senna-docusate (SENOKOT-S) 8.6-50 MG tablet Take 1 tablet by mouth daily.      trospium (SANCTURA) 20 MG tablet TAKE 1 TABLET BY MOUTH TWICE A DAY 180 tablet 3    zolpidem (AMBIEN) 5 MG tablet Take 2.5 tablets (12.5 mg total) by mouth at bedtime as needed for sleep. (Patient taking differently: Take 5 mg by mouth at bedtime.) 30 tablet 0    No current facility-administered medications for this visit.   Allergies  Allergen Reactions   Penicillins Rash    Has patient had a PCN reaction causing immediate rash, facial/tongue/throat swelling, SOB or lightheadedness with hypotension: No Has patient had a PCN reaction causing severe rash involving mucus membranes or skin necrosis: No Has patient had a PCN reaction that required hospitalization: No Has patient had a PCN reaction occurring within the last 10 years: No If all of the above answers are "NO", then may proceed with Cephalosporin use.     Social History   Tobacco Use   Smoking status: Never    Passive exposure: Never   Smokeless tobacco: Never  Substance Use Topics   Alcohol use: Yes    Comment: occasional    Family History  Problem Relation Age of Onset   Colon cancer Neg Hx      Review of Systems  Musculoskeletal:  Positive for arthralgias.       Stiffness  All other systems reviewed and are negative.   Objective:  Physical Exam Constitutional:      General: She is not in acute distress.    Appearance: Normal appearance. She is not ill-appearing.  HENT:     Head: Normocephalic and atraumatic.     Right Ear: External ear normal.     Left Ear: External ear normal.     Nose:  Nose normal.     Mouth/Throat:     Mouth: Mucous membranes are moist.     Pharynx: Oropharynx is clear.  Eyes:     Extraocular Movements: Extraocular movements intact.     Conjunctiva/sclera: Conjunctivae normal.  Cardiovascular:     Rate and Rhythm: Normal rate and regular rhythm.     Pulses: Normal pulses.     Heart sounds: Normal heart sounds.  Pulmonary:     Effort: Pulmonary effort is normal.       Breath sounds: Normal breath sounds.  Abdominal:     General: Bowel sounds are normal.     Palpations: Abdomen is soft.     Tenderness: There is no abdominal tenderness.  Musculoskeletal:        General: Tenderness present.     Cervical back: Normal range of motion and neck supple.     Comments: Mild expected post-operative swelling left knee.  Incision appears to be healing well without erythema or drainage.  ROM left knee currently 7-97 degrees (4 weeks prior was 10-105 degrees, and pre-surgery was 5-90 degrees).  Mildly antalgic gait with some circumduction of LLE.  BLE appear grossly neurovascularly intact.    Skin:    General: Skin is warm and dry.  Neurological:     Mental Status: She is alert and oriented to person, place, and time. Mental status is at baseline.  Psychiatric:        Mood and Affect: Mood normal.        Behavior: Behavior normal.     Vital signs in last 24 hours: @VSRANGES@  Labs:   Estimated body mass index is 25.61 kg/m as calculated from the following:   Height as of 04/26/22: 5' 2" (1.575 m).   Weight as of 04/26/22: 63.5 kg.   Imaging Review Plain radiographs demonstrate total knee arthroplasty components in good position without adverse features.   Assessment/Plan:  Post-operative features following left total knee replacement   The patient history, physical examination, clinical judgment of the provider and imaging studies are consistent with post-operative features following left total knee replacement  and closed manipulation of the knee  is deemed medically necessary. Treatment options were discussed at length. The risks and benefits of proceeding with surgery were discussed. The patient demonstrated understanding and agreed to proceed with the plan and consent was signed. Patient is being admitted for inpatient treatment for closed manipulation of the left knee s/p left total knee replacement. The patient is planning to be discharged home and will continue outpatient PT.   Patient's anticipated LOS is less than 2 midnights, meeting these requirements: - Younger than 65 - Lives within 1 hour of care - Has a competent adult at home to recover with post-op recover - NO history of  - Chronic pain requiring opiods  - Diabetes  - Coronary Artery Disease  - Heart failure  - Heart attack  - Stroke  - DVT/VTE  - Cardiac arrhythmia  - Respiratory Failure/COPD  - Renal failure  - Anemia  - Advanced Liver disease   

## 2022-05-31 NOTE — H&P (Addendum)
TOTAL KNEE MANIPULATION ADMISSION H&P  Patient is being admitted for closed left knee manipulation following recent total knee arthroplasty.  Subjective:  Chief Complaint:left knee post-operative stiffness.  HPI: Breanna Hill, 82 y.o. female, with a history of recent left total knee replacement on 04/02/2022 has had unfortunate post-operative stiffness of the same knee.  Physical therapy, elevation, and home exercises have not helped.  Patient reports difficulty with normal gait.  Onset of symptoms following surgery.  Patient was limited pre-operatively by limited range of motion as well.  Patient describes pain as manageable but has had continued issues with stiffness in the knee.  Uses cane for ambulation assistance. There is no active infection.  Patient Active Problem List   Diagnosis Date Noted   Primary osteoarthritis of left knee 04/02/2022   Malignant neoplasm of upper-inner quadrant of left breast in female, estrogen receptor positive (HCC) 03/05/2017   History of colonic polyps 10/31/2015   High cholesterol 10/17/2015   SHOULDER, ARTHRITIS, DEGEN./OSTEO 02/28/2009   CONTRACTURE OF SHOULDER JOINT 02/16/2009   SHOULDER PAIN 02/16/2009   Past Medical History:  Diagnosis Date   Ankylosis of left knee    Arthritis    R hand    Breast cancer (HCC)    left   Breast disorder    cancer   Cancer (HCC)    breast CA- diagnosed with needle biopsy   Depression    GERD (gastroesophageal reflux disease)    rare use of tums    High cholesterol 10/17/2015   History of radiation therapy 03/27/17- 04/23/17   Left Breast, 2.67 Gy in 15 fractions for a total dose of 40.05 Gy. Boost, 2 Gy in 5 fractions for a total dose of 10 Gy.    Hypertension    Hypothyroidism    Pre-diabetes    Hgba1C- in the 6 's , per pt., she monitors her diet    Past Surgical History:  Procedure Laterality Date   BACK SURGERY  1973   BREAST BIOPSY Left 02/04/2017   BREAST BIOPSY Right 11/29/2021   MM RT  BREAST BX W LOC DEV 1ST LESION IMAGE BX SPEC STEREO GUIDE 11/29/2021 [facility]   BREAST LUMPECTOMY Left 02/2017   BREAST LUMPECTOMY WITH RADIOACTIVE SEED AND SENTINEL LYMPH NODE BIOPSY Left 02/27/2017   Procedure: LEFT BREAST LUMPECTOMY WITH RADIOACTIVE SEED AND LEFT AXILLARY SENTINEL LYMPH NODE BIOPSY;  Surgeon: Manus Rudd, MD;  Location: [facility];  Service: General;  Laterality: Left;   COLONOSCOPY     X 2   COLONOSCOPY N/A 02/22/2016   Procedure: COLONOSCOPY;  Surgeon: Malissa Hippo, MD;  [facility];  Service: Endoscopy;  Laterality: N/A;  1:45   EYE SURGERY Bilateral    phlebpheroplasty   Left shoultder surgery     for bone spurs & frozen shoulder    REVERSE SHOULDER ARTHROPLASTY Left 09/14/2020   Procedure: REVERSE SHOULDER ARTHROPLASTY;  Surgeon: Bjorn Pippin, MD;  Location: [facility];  Service: Orthopedics;  Laterality: Left;   Right bunionectomy     TONSILLECTOMY     TOTAL KNEE ARTHROPLASTY Left 04/02/2022   Procedure: TOTAL KNEE ARTHROPLASTY;  Surgeon: [name], MD;  Location: [facility];  Service: Orthopedics;  Laterality: Left;   TUBAL LIGATION      Current Outpatient Medications  Medication Sig Dispense Refill Last Dose   Apoaequorin (PREVAGEN) 10 MG CAPS Take 10 mg by mouth in the morning.      atorvastatin (LIPITOR) 20 MG tablet Take 20  mg by mouth in the morning.      Calcium Carbonate-Vitamin D (CALCIUM 600+D PO) Take 1 tablet by mouth at bedtime.      Cholecalciferol (VITAMIN D-3) 125 MCG (5000 UT) TABS Take 5,000 Units by mouth in the morning.      diclofenac Sodium (VOLTAREN ARTHRITIS PAIN) 1 % GEL Apply 2 g topically in the morning. Knee (Patient not taking: Reported on 05/01/2022)      erythromycin ophthalmic ointment Place a 1/2 inch ribbon of ointment into the lower left eyelid 4 times daily for 7 days. 3.5 g 0    GEMTESA 75 MG TABS TAKE 1 TABLET BY MOUTH AT 2PM (Patient taking differently: Take 75 mg by mouth every Monday, Wednesday, and  Friday. At bedtime) 90 tablet 3    letrozole (FEMARA) 2.5 MG tablet TAKE 1 TABLET BY MOUTH EVERY DAY 90 tablet 3    levothyroxine (SYNTHROID) 88 MCG tablet Take 88 mcg by mouth daily before breakfast.      mirtazapine (REMERON) 15 MG tablet Take 15 mg by mouth at bedtime.      olmesartan (BENICAR) 20 MG tablet Take 20 mg by mouth daily.      senna-docusate (SENOKOT-S) 8.6-50 MG tablet Take 1 tablet by mouth daily.      trospium (SANCTURA) 20 MG tablet TAKE 1 TABLET BY MOUTH TWICE A DAY 180 tablet 3    zolpidem (AMBIEN) 5 MG tablet Take 2.5 tablets (12.5 mg total) by mouth at bedtime as needed for sleep. (Patient taking differently: Take 5 mg by mouth at bedtime.) 30 tablet 0    No current facility-administered medications for this visit.   Allergies  Allergen Reactions   Penicillins Rash    Has patient had a PCN reaction causing immediate rash, facial/tongue/throat swelling, SOB or lightheadedness with hypotension: No Has patient had a PCN reaction causing severe rash involving mucus membranes or skin necrosis: No Has patient had a PCN reaction that required hospitalization: No Has patient had a PCN reaction occurring within the last 10 years: No If all of the above answers are "NO", then may proceed with Cephalosporin use.     Social History   Tobacco Use   Smoking status: Never    Passive exposure: Never   Smokeless tobacco: Never  Substance Use Topics   Alcohol use: Yes    Comment: occasional    Family History  Problem Relation Age of Onset   Colon cancer Neg Hx      Review of Systems  Musculoskeletal:  Positive for arthralgias.       Stiffness  All other systems reviewed and are negative.   Objective:  Physical Exam Constitutional:      General: She is not in acute distress.    Appearance: Normal appearance. She is not ill-appearing.  HENT:     Head: Normocephalic and atraumatic.     Right Ear: External ear normal.     Left Ear: External ear normal.     Nose:  Nose normal.     Mouth/Throat:     Mouth: Mucous membranes are moist.     Pharynx: Oropharynx is clear.  Eyes:     Extraocular Movements: Extraocular movements intact.     Conjunctiva/sclera: Conjunctivae normal.  Cardiovascular:     Rate and Rhythm: Normal rate and regular rhythm.     Pulses: Normal pulses.     Heart sounds: Normal heart sounds.  Pulmonary:     Effort: Pulmonary effort is normal.  Breath sounds: Normal breath sounds.  Abdominal:     General: Bowel sounds are normal.     Palpations: Abdomen is soft.     Tenderness: There is no abdominal tenderness.  Musculoskeletal:        General: Tenderness present.     Cervical back: Normal range of motion and neck supple.     Comments: Mild expected post-operative swelling left knee.  Incision appears to be healing well without erythema or drainage.  ROM left knee currently 7-97 degrees (4 weeks prior was 10-105 degrees, and pre-surgery was 5-90 degrees).  Mildly antalgic gait with some circumduction of LLE.  BLE appear grossly neurovascularly intact.    Skin:    General: Skin is warm and dry.  Neurological:     Mental Status: She is alert and oriented to person, place, and time. Mental status is at baseline.  Psychiatric:        Mood and Affect: Mood normal.        Behavior: Behavior normal.     Vital signs in last 24 hours: @VSRANGES @  Labs:   Estimated body mass index is 25.61 kg/m as calculated from the following:   Height as of 04/26/22: 5\' 2"  (1.575 m).   Weight as of 04/26/22: 63.5 kg.   Imaging Review Plain radiographs demonstrate total knee arthroplasty components in good position without adverse features.   Assessment/Plan:  Post-operative features following left total knee replacement   The patient history, physical examination, clinical judgment of the provider and imaging studies are consistent with post-operative features following left total knee replacement  and closed manipulation of the knee  is deemed medically necessary. Treatment options were discussed at length. The risks and benefits of proceeding with surgery were discussed. The patient demonstrated understanding and agreed to proceed with the plan and consent was signed. Patient is being admitted for inpatient treatment for closed manipulation of the left knee s/p left total knee replacement. The patient is planning to be discharged home and will continue outpatient PT.   Patient's anticipated LOS is less than 2 midnights, meeting these requirements: - Younger than 51 - Lives within 1 hour of care - Has a competent adult at home to recover with post-op recover - NO history of  - Chronic pain requiring opiods  - Diabetes  - Coronary Artery Disease  - Heart failure  - Heart attack  - Stroke  - DVT/VTE  - Cardiac arrhythmia  - Respiratory Failure/COPD  - Renal failure  - Anemia  - Advanced Liver disease

## 2022-06-01 ENCOUNTER — Other Ambulatory Visit: Payer: Self-pay

## 2022-06-01 ENCOUNTER — Encounter (HOSPITAL_BASED_OUTPATIENT_CLINIC_OR_DEPARTMENT_OTHER): Payer: Self-pay | Admitting: Orthopedic Surgery

## 2022-06-01 DIAGNOSIS — M25662 Stiffness of left knee, not elsewhere classified: Secondary | ICD-10-CM | POA: Diagnosis not present

## 2022-06-01 DIAGNOSIS — M25562 Pain in left knee: Secondary | ICD-10-CM | POA: Diagnosis not present

## 2022-06-01 DIAGNOSIS — R2689 Other abnormalities of gait and mobility: Secondary | ICD-10-CM | POA: Diagnosis not present

## 2022-06-01 NOTE — Progress Notes (Addendum)
Spoke w/ via phone for pre-op interview---pt Lab needs dos----  cbc with dif, cmet , ekg          Lab results------echo 03-18-2019 epic, stress test 03-02-2019 epic, lov cardiology dr Purvis Sheffield 05-18-2019 epic COVID test -----patient states asymptomatic no test needed Arrive at -------730 am 06-11-2022 NPO after MN NO Solid Food.  Clear liquids from MN until---630 am Med rec completed Medications to take morning of surgery -----levothyroxine, letrozole, trospium, atorbastatin Diabetic medication -----n/a Patient instructed no nail polish to be worn day of surgery Patient instructed to bring photo id and insurance card day of surgery Patient aware to have Driver (ride ) / caregiver    son Breanna Hill for 24 hours after surgery  Patient Special Instructions -----pt instructed to stop 81 mg asa 4 days before surgery last dose to be 06-03-2022 per surgeon instructions Pre-Op special Instructions -----none Patient verbalized understanding of instructions that were given at this phone interview. Patient denies shortness of breath, chest pain, fever, cough at this phone interview.

## 2022-06-04 DIAGNOSIS — M25662 Stiffness of left knee, not elsewhere classified: Secondary | ICD-10-CM | POA: Diagnosis not present

## 2022-06-04 DIAGNOSIS — R2689 Other abnormalities of gait and mobility: Secondary | ICD-10-CM | POA: Diagnosis not present

## 2022-06-04 DIAGNOSIS — M25562 Pain in left knee: Secondary | ICD-10-CM | POA: Diagnosis not present

## 2022-06-05 ENCOUNTER — Other Ambulatory Visit (HOSPITAL_COMMUNITY): Payer: Medicare HMO

## 2022-06-06 DIAGNOSIS — R2689 Other abnormalities of gait and mobility: Secondary | ICD-10-CM | POA: Diagnosis not present

## 2022-06-06 DIAGNOSIS — M25562 Pain in left knee: Secondary | ICD-10-CM | POA: Diagnosis not present

## 2022-06-06 DIAGNOSIS — M25662 Stiffness of left knee, not elsewhere classified: Secondary | ICD-10-CM | POA: Diagnosis not present

## 2022-06-08 DIAGNOSIS — M25662 Stiffness of left knee, not elsewhere classified: Secondary | ICD-10-CM | POA: Diagnosis not present

## 2022-06-08 DIAGNOSIS — R2689 Other abnormalities of gait and mobility: Secondary | ICD-10-CM | POA: Diagnosis not present

## 2022-06-08 DIAGNOSIS — M25562 Pain in left knee: Secondary | ICD-10-CM | POA: Diagnosis not present

## 2022-06-08 NOTE — Anesthesia Preprocedure Evaluation (Addendum)
Anesthesia Evaluation  Patient identified by MRN, date of birth, ID band Patient awake    Reviewed: Allergy & Precautions, NPO status , Patient's Chart, lab work & pertinent test results  Airway Mallampati: II  TM Distance: >3 FB Neck ROM: Full    Dental no notable dental hx. (+) Teeth Intact, Dental Advisory Given   Pulmonary neg pulmonary ROS   Pulmonary exam normal breath sounds clear to auscultation       Cardiovascular hypertension, Pt. on medications Normal cardiovascular exam Rhythm:Regular Rate:Normal     Neuro/Psych  PSYCHIATRIC DISORDERS  Depression    negative neurological ROS     GI/Hepatic Neg liver ROS,GERD  ,,  Endo/Other  Hypothyroidism    Renal/GU negative Renal ROS     Musculoskeletal  (+) Arthritis ,    Abdominal   Peds  Hematology   Anesthesia Other Findings All: PCN  L breast CA  Reproductive/Obstetrics                             Anesthesia Physical Anesthesia Plan  ASA: 3  Anesthesia Plan: MAC   Post-op Pain Management: Minimal or no pain anticipated   Induction: Intravenous  PONV Risk Score and Plan: 3 and Treatment may vary due to age or medical condition and Propofol infusion  Airway Management Planned: Mask  Additional Equipment: None  Intra-op Plan:   Post-operative Plan:   Informed Consent: I have reviewed the patients History and Physical, chart, labs and discussed the procedure including the risks, benefits and alternatives for the proposed anesthesia with the patient or authorized representative who has indicated his/her understanding and acceptance.     Dental advisory given  Plan Discussed with: CRNA  Anesthesia Plan Comments:        Anesthesia Quick Evaluation

## 2022-06-11 ENCOUNTER — Ambulatory Visit (HOSPITAL_BASED_OUTPATIENT_CLINIC_OR_DEPARTMENT_OTHER): Payer: Medicare HMO | Admitting: Anesthesiology

## 2022-06-11 ENCOUNTER — Ambulatory Visit (HOSPITAL_BASED_OUTPATIENT_CLINIC_OR_DEPARTMENT_OTHER)
Admission: RE | Admit: 2022-06-11 | Discharge: 2022-06-11 | Disposition: A | Discharge status: Home / Self Care | Payer: Medicare HMO | Attending: Orthopedic Surgery | Admitting: Orthopedic Surgery

## 2022-06-11 ENCOUNTER — Encounter (HOSPITAL_BASED_OUTPATIENT_CLINIC_OR_DEPARTMENT_OTHER): Payer: Self-pay | Admitting: Orthopedic Surgery

## 2022-06-11 ENCOUNTER — Encounter (HOSPITAL_BASED_OUTPATIENT_CLINIC_OR_DEPARTMENT_OTHER)
Admission: RE | Disposition: A | Discharge status: Home / Self Care | Payer: Self-pay | Source: Home / Self Care | Attending: Orthopedic Surgery

## 2022-06-11 ENCOUNTER — Other Ambulatory Visit: Payer: Self-pay

## 2022-06-11 DIAGNOSIS — E039 Hypothyroidism, unspecified: Secondary | ICD-10-CM | POA: Diagnosis not present

## 2022-06-11 DIAGNOSIS — M25562 Pain in left knee: Secondary | ICD-10-CM

## 2022-06-11 DIAGNOSIS — Z01818 Encounter for other preprocedural examination: Secondary | ICD-10-CM

## 2022-06-11 DIAGNOSIS — M9689 Other intraoperative and postprocedural complications and disorders of the musculoskeletal system: Secondary | ICD-10-CM | POA: Diagnosis not present

## 2022-06-11 DIAGNOSIS — I10 Essential (primary) hypertension: Secondary | ICD-10-CM | POA: Diagnosis not present

## 2022-06-11 DIAGNOSIS — K219 Gastro-esophageal reflux disease without esophagitis: Secondary | ICD-10-CM | POA: Diagnosis not present

## 2022-06-11 DIAGNOSIS — T8482XA Fibrosis due to internal orthopedic prosthetic devices, implants and grafts, initial encounter: Secondary | ICD-10-CM | POA: Diagnosis not present

## 2022-06-11 DIAGNOSIS — F32A Depression, unspecified: Secondary | ICD-10-CM | POA: Insufficient documentation

## 2022-06-11 DIAGNOSIS — I447 Left bundle-branch block, unspecified: Secondary | ICD-10-CM | POA: Diagnosis not present

## 2022-06-11 DIAGNOSIS — M24662 Ankylosis, left knee: Secondary | ICD-10-CM | POA: Diagnosis not present

## 2022-06-11 DIAGNOSIS — Z96652 Presence of left artificial knee joint: Secondary | ICD-10-CM | POA: Diagnosis not present

## 2022-06-11 DIAGNOSIS — M1990 Unspecified osteoarthritis, unspecified site: Secondary | ICD-10-CM

## 2022-06-11 DIAGNOSIS — M25669 Stiffness of unspecified knee, not elsewhere classified: Secondary | ICD-10-CM

## 2022-06-11 HISTORY — PX: KNEE CLOSED REDUCTION: SHX995

## 2022-06-11 HISTORY — DX: Ankylosis, left knee: M24.662

## 2022-06-11 LAB — CBC WITH DIFFERENTIAL/PLATELET
Abs Immature Granulocytes: 0.01 10*3/uL (ref 0.00–0.07)
Basophils Absolute: 0.1 10*3/uL (ref 0.0–0.1)
Basophils Relative: 1 %
Eosinophils Absolute: 0.1 10*3/uL (ref 0.0–0.5)
Eosinophils Relative: 3 %
HCT: 38.6 % (ref 36.0–46.0)
Hemoglobin: 12.9 g/dL (ref 12.0–15.0)
Immature Granulocytes: 0 %
Lymphocytes Relative: 27 %
Lymphs Abs: 1.4 10*3/uL (ref 0.7–4.0)
MCH: 30.8 pg (ref 26.0–34.0)
MCHC: 33.4 g/dL (ref 30.0–36.0)
MCV: 92.1 fL (ref 80.0–100.0)
Monocytes Absolute: 0.4 10*3/uL (ref 0.1–1.0)
Monocytes Relative: 8 %
Neutro Abs: 3.3 10*3/uL (ref 1.7–7.7)
Neutrophils Relative %: 61 %
Platelets: 297 10*3/uL (ref 150–400)
RBC: 4.19 MIL/uL (ref 3.87–5.11)
RDW: 13.2 % (ref 11.5–15.5)
WBC: 5.3 10*3/uL (ref 4.0–10.5)
nRBC: 0 % (ref 0.0–0.2)

## 2022-06-11 LAB — COMPREHENSIVE METABOLIC PANEL
ALT: 22 U/L (ref 0–44)
AST: 23 U/L (ref 15–41)
Albumin: 4 g/dL (ref 3.5–5.0)
Alkaline Phosphatase: 46 U/L (ref 38–126)
Anion gap: 10 (ref 5–15)
BUN: 25 mg/dL — ABNORMAL HIGH (ref 8–23)
CO2: 23 mmol/L (ref 22–32)
Calcium: 9.4 mg/dL (ref 8.9–10.3)
Chloride: 103 mmol/L (ref 98–111)
Creatinine, Ser: 1.18 mg/dL — ABNORMAL HIGH (ref 0.44–1.00)
GFR, Estimated: 46 mL/min — ABNORMAL LOW (ref >60)
Glucose, Bld: 111 mg/dL — ABNORMAL HIGH (ref 70–99)
Potassium: 4 mmol/L (ref 3.5–5.1)
Sodium: 136 mmol/L (ref 135–145)
Total Bilirubin: 0.7 mg/dL (ref 0.3–1.2)
Total Protein: 7.6 g/dL (ref 6.5–8.1)

## 2022-06-11 SURGERY — MANIPULATION, KNEE, CLOSED
Anesthesia: Monitor Anesthesia Care | Site: Knee | Laterality: Left

## 2022-06-11 MED ORDER — ACETAMINOPHEN 500 MG PO TABS
1000.0000 mg | ORAL_TABLET | Freq: Once | ORAL | Status: AC
  Administered 2022-06-11: 1000 mg via ORAL

## 2022-06-11 MED ORDER — ONDANSETRON HCL 4 MG/2ML IJ SOLN
INTRAMUSCULAR | Status: DC | PRN
  Administered 2022-06-11: 4 mg via INTRAVENOUS

## 2022-06-11 MED ORDER — FENTANYL CITRATE (PF) 100 MCG/2ML IJ SOLN
INTRAMUSCULAR | Status: AC
  Filled 2022-06-11: qty 2

## 2022-06-11 MED ORDER — LIDOCAINE HCL (PF) 2 % IJ SOLN
INTRAMUSCULAR | Status: AC
  Filled 2022-06-11: qty 5

## 2022-06-11 MED ORDER — CHLORHEXIDINE GLUCONATE 4 % EX SOLN: 60.0000 mL | Freq: Once | CUTANEOUS | Status: DC

## 2022-06-11 MED ORDER — DEXMEDETOMIDINE HCL IN NACL 80 MCG/20ML IV SOLN
INTRAVENOUS | Status: DC | PRN
  Administered 2022-06-11: 4 ug via INTRAVENOUS

## 2022-06-11 MED ORDER — PROPOFOL 10 MG/ML IV BOLUS
INTRAVENOUS | Status: DC | PRN
  Administered 2022-06-11: 10 mg via INTRAVENOUS
  Administered 2022-06-11: 40 mg via INTRAVENOUS

## 2022-06-11 MED ORDER — ONDANSETRON HCL 4 MG/2ML IJ SOLN
INTRAMUSCULAR | Status: AC
  Filled 2022-06-11: qty 2

## 2022-06-11 MED ORDER — TRAMADOL HCL 50 MG PO TABS: 50.0000 mg | ORAL_TABLET | Freq: Three times a day (TID) | ORAL | 0 refills | Status: AC | PRN

## 2022-06-11 MED ORDER — FENTANYL CITRATE (PF) 100 MCG/2ML IJ SOLN
25.0000 ug | INTRAMUSCULAR | Status: DC | PRN
  Administered 2022-06-11: 25 ug via INTRAVENOUS

## 2022-06-11 MED ORDER — LACTATED RINGERS IV SOLN
INTRAVENOUS | Status: DC
  Administered 2022-06-11: 1000 mL via INTRAVENOUS

## 2022-06-11 MED ORDER — ONDANSETRON HCL 4 MG/2ML IJ SOLN: 4.0000 mg | Freq: Once | INTRAMUSCULAR | Status: DC | PRN

## 2022-06-11 MED ORDER — ACETAMINOPHEN 10 MG/ML IV SOLN: 1000.0000 mg | Freq: Once | INTRAVENOUS | Status: DC | PRN

## 2022-06-11 MED ORDER — ACETAMINOPHEN 500 MG PO TABS
ORAL_TABLET | ORAL | Status: AC
  Filled 2022-06-11: qty 2

## 2022-06-11 MED ORDER — POVIDONE-IODINE 10 % EX SWAB: 2.0000 | Freq: Once | CUTANEOUS | Status: DC

## 2022-06-11 MED ORDER — PROPOFOL 10 MG/ML IV BOLUS
INTRAVENOUS | Status: AC
  Filled 2022-06-11: qty 20

## 2022-06-11 MED ORDER — NAPROXEN 500 MG PO TBEC: 500.0000 mg | DELAYED_RELEASE_TABLET | Freq: Two times a day (BID) | ORAL | 0 refills | Status: AC

## 2022-06-11 NOTE — Discharge Instructions (Addendum)
  Post Anesthesia Home Care Instructions  Activity: Get plenty of rest for the remainder of the day. A responsible individual must stay with you for 24 hours following the procedure.  For the next 24 hours, DO NOT: -Drive a car -Advertising copywriter -Drink alcoholic beverages -Take any medication unless instructed by your physician -Make any legal decisions or sign important papers.  Meals: Start with liquid foods such as gelatin or soup. Progress to regular foods as tolerated. Avoid greasy, spicy, heavy foods. If nausea and/or vomiting occur, drink only clear liquids until the nausea and/or vomiting subsides. Call your physician if vomiting continues.  Special Instructions/Symptoms: Your throat may feel dry or sore from the anesthesia or the breathing tube placed in your throat during surgery. If this causes discomfort, gargle with warm salt water. The discomfort should disappear within 24 hours.        No acetaminophen/Tylenol until after 4:30 pm today if needed.    Orthopedic Discharge Instructions  Diet: As you were doing prior to hospitalization   Shower:  May shower but keep the wounds dry, use an occlusive plastic wrap, NO SOAKING IN TUB.  If the bandage gets wet, change with a clean dry gauze.  If you have a splint on, leave the splint in place and keep the splint dry with a plastic bag.  Dressing:  none  If you had hand or foot surgery, we will plan to remove your stitches in about 2 weeks in the office.  For all other surgeries, there are sticky tapes (steri-strips) on your wounds and all the stitches are absorbable.  Leave the steri-strips in place when changing your dressings, they will peel off with time, usually 2-3 weeks.  Activity:  Increase activity slowly as tolerated, but follow the weight bearing instructions below.  The rules on driving is that you can not be taking narcotics while you drive, and you must feel in control of the vehicle.    Weight Bearing:    weight bearing as tolerated. Work on aggressive range of motion.  Colace (over the counter) 100 mg by mouth twice a day  Drink plenty of fluids (prune juice may be helpful) and high fiber foods Miralax (over the counter) for constipation as needed.    Itching:  If you experience itching with your medications, try taking only a single pain pill, or even half a pain pill at a time.  You may take up to 10 pain pills per day, and you can also use benadryl over the counter for itching or also to help with sleep.   Precautions:  If you experience chest pain or shortness of breath - call 911 immediately for transfer to the hospital emergency department!!   Call office [phone]) for the following: Temperature greater than 101F Persistent nausea and vomiting Severe uncontrolled pain Redness, tenderness, or signs of infection (pain, swelling, redness, odor or green/yellow discharge around the site) Difficulty breathing, headache or visual disturbances Hives Persistent dizziness or light-headedness Extreme fatigue Any other questions or concerns you may have after discharge  In an emergency, call 911 or go to an Emergency Department at a nearby hospital  Follow- Up Appointment:  Please call for an appointment to be seen approximately 2 weeks after surgery in [facility] with your surgeon Dr. Weber Cooks - [phone] Address: [address], [place], [place] [address]

## 2022-06-11 NOTE — Anesthesia Procedure Notes (Signed)
Procedure Name: MAC Date/Time: 06/11/2022 9:17 AM  Performed by: Jessica Priest, CRNAPre-anesthesia Checklist: Patient identified, Timeout performed, Patient being monitored, Suction available and Emergency Drugs available Patient Re-evaluated:Patient Re-evaluated prior to induction Oxygen Delivery Method: Simple face mask Preoxygenation: Pre-oxygenation with 100% oxygen Induction Type: IV induction Placement Confirmation: breath sounds checked- equal and bilateral, CO2 detector and positive ETCO2

## 2022-06-11 NOTE — Anesthesia Postprocedure Evaluation (Signed)
Anesthesia Post Note  Patient: Breanna Hill  Procedure(s) Performed: CLOSED MANIPULATION KNEE (Left: Knee)     Patient location during evaluation: PACU Anesthesia Type: MAC Level of consciousness: awake and alert Pain management: pain level controlled Vital Signs Assessment: post-procedure vital signs reviewed and stable Respiratory status: spontaneous breathing, nonlabored ventilation, respiratory function stable and patient connected to nasal cannula oxygen Cardiovascular status: stable and blood pressure returned to baseline Postop Assessment: no apparent nausea or vomiting Anesthetic complications: no   No notable events documented.  Last Vitals:  Vitals:   06/11/22 1000 06/11/22 1015  BP: 133/69 (!) 149/109  Pulse: 63 64  Resp: 15 11  Temp:    SpO2: 94% 100%    Last Pain:  Vitals:   06/11/22 1015  TempSrc:   PainSc: 0-No pain                 Trevor Iha

## 2022-06-11 NOTE — Op Note (Signed)
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 female with a diagnosis of LEFT KNEE ANKYLOSIS who underwent total knee arthroplasty on 04/02/22  and did well overall however has had stiffness with range of motion notably limited flexion.   The risks benefits and alternatives were discussed with the patient preoperatively including but not limited to the risks of infection, bleeding, nerve injury, cardiopulmonary complications, the need for revision surgery, among others, and the patient was willing to proceed.  ESTIMATED BLOOD LOSS: 0cc  OPERATIVE IMPLANTS: none  OPERATIVE FINDINGS: Improved range of motion 0 to 90 before manipulation and 0 to 120  after the manipulation  OPERATIVE PROCEDURE:  The patient was brought to the operating room.  Anesthesia was induced.  With the patient's supine on the stretcher the knee was assessed and found to be stable to varus valgus stress had full extension but flexion was limited to approximately 90 degrees.   With the hip flexed 90 degrees the knee was gently manipulated by putting pressure at the proximal tibia.  Small crackles were audibly heard as the adhesions were disrupted.  Once the manipulations was completed the range of motion was found to be 0 to 120.  The patient was awoken from anesthesia and taken to the recovery room in stable condition.  Post op recs: WB: WBAT Abx:not indicted Imaging: none Dressing: none DVT prophylaxis: not indicated Follow up: 2 weeks with Dr. Blanchie Dessert at [facility].  Address: [address], [place], [place] [address]  Office Phone: [phone]   Weber Cooks,  MD Orthopaedic Surgery

## 2022-06-11 NOTE — Interval H&P Note (Signed)
The patient has been re-examined, and the chart reviewed, and there have been no interval changes to the documented history and physical.    Plan for L knee MUA after TKA for post op stiffness.  The operative side was examined and the patient was confirmed to have sensation to DPN, SPN, TN intact, Motor EHL, ext, flex 5/5, and DP 2+, PT 2+, No significant edema.   The risks, benefits, and alternatives have been discussed at length with patient, and the patient is willing to proceed.  Left knee marked. Consent has been signed.

## 2022-06-11 NOTE — Transfer of Care (Signed)
Immediate Anesthesia Transfer of Care Note  Patient: Breanna Hill  Procedure(s) Performed: Procedure(s) (LRB): CLOSED MANIPULATION KNEE (Left)  Patient Location: PACU  Anesthesia Type: MAC  Level of Consciousness: awake, sedated, patient cooperative and responds to stimulation  Airway & Oxygen Therapy: Patient Spontanous Breathing and Patient awake on RA   Post-op Assessment: Report given to PACU RN, Post -op Vital signs reviewed and stable and Patient moving all extremities  Post vital signs: Reviewed and stable  Complications: No apparent anesthesia complications

## 2022-06-12 ENCOUNTER — Encounter (HOSPITAL_BASED_OUTPATIENT_CLINIC_OR_DEPARTMENT_OTHER): Payer: Self-pay | Admitting: Orthopedic Surgery

## 2022-06-12 DIAGNOSIS — M25662 Stiffness of left knee, not elsewhere classified: Secondary | ICD-10-CM | POA: Diagnosis not present

## 2022-06-12 DIAGNOSIS — R2689 Other abnormalities of gait and mobility: Secondary | ICD-10-CM | POA: Diagnosis not present

## 2022-06-12 DIAGNOSIS — M25562 Pain in left knee: Secondary | ICD-10-CM | POA: Diagnosis not present

## 2022-06-13 DIAGNOSIS — R2689 Other abnormalities of gait and mobility: Secondary | ICD-10-CM | POA: Diagnosis not present

## 2022-06-13 DIAGNOSIS — M25562 Pain in left knee: Secondary | ICD-10-CM | POA: Diagnosis not present

## 2022-06-13 DIAGNOSIS — M25662 Stiffness of left knee, not elsewhere classified: Secondary | ICD-10-CM | POA: Diagnosis not present

## 2022-06-14 DIAGNOSIS — R2689 Other abnormalities of gait and mobility: Secondary | ICD-10-CM | POA: Diagnosis not present

## 2022-06-14 DIAGNOSIS — M25562 Pain in left knee: Secondary | ICD-10-CM | POA: Diagnosis not present

## 2022-06-14 DIAGNOSIS — M25662 Stiffness of left knee, not elsewhere classified: Secondary | ICD-10-CM | POA: Diagnosis not present

## 2022-06-15 DIAGNOSIS — M25662 Stiffness of left knee, not elsewhere classified: Secondary | ICD-10-CM | POA: Diagnosis not present

## 2022-06-15 DIAGNOSIS — M25562 Pain in left knee: Secondary | ICD-10-CM | POA: Diagnosis not present

## 2022-06-15 DIAGNOSIS — R2689 Other abnormalities of gait and mobility: Secondary | ICD-10-CM | POA: Diagnosis not present

## 2022-06-18 DIAGNOSIS — R2689 Other abnormalities of gait and mobility: Secondary | ICD-10-CM | POA: Diagnosis not present

## 2022-06-18 DIAGNOSIS — M25662 Stiffness of left knee, not elsewhere classified: Secondary | ICD-10-CM | POA: Diagnosis not present

## 2022-06-18 DIAGNOSIS — M25562 Pain in left knee: Secondary | ICD-10-CM | POA: Diagnosis not present

## 2022-06-20 DIAGNOSIS — R2689 Other abnormalities of gait and mobility: Secondary | ICD-10-CM | POA: Diagnosis not present

## 2022-06-20 DIAGNOSIS — M25662 Stiffness of left knee, not elsewhere classified: Secondary | ICD-10-CM | POA: Diagnosis not present

## 2022-06-20 DIAGNOSIS — M25562 Pain in left knee: Secondary | ICD-10-CM | POA: Diagnosis not present

## 2022-06-22 DIAGNOSIS — M25662 Stiffness of left knee, not elsewhere classified: Secondary | ICD-10-CM | POA: Diagnosis not present

## 2022-06-22 DIAGNOSIS — R2689 Other abnormalities of gait and mobility: Secondary | ICD-10-CM | POA: Diagnosis not present

## 2022-06-22 DIAGNOSIS — M25562 Pain in left knee: Secondary | ICD-10-CM | POA: Diagnosis not present

## 2022-06-25 DIAGNOSIS — M25562 Pain in left knee: Secondary | ICD-10-CM | POA: Diagnosis not present

## 2022-06-25 DIAGNOSIS — M25662 Stiffness of left knee, not elsewhere classified: Secondary | ICD-10-CM | POA: Diagnosis not present

## 2022-06-25 DIAGNOSIS — R2689 Other abnormalities of gait and mobility: Secondary | ICD-10-CM | POA: Diagnosis not present

## 2022-06-26 DIAGNOSIS — R2689 Other abnormalities of gait and mobility: Secondary | ICD-10-CM | POA: Diagnosis not present

## 2022-06-26 DIAGNOSIS — M25662 Stiffness of left knee, not elsewhere classified: Secondary | ICD-10-CM | POA: Diagnosis not present

## 2022-06-26 DIAGNOSIS — M25562 Pain in left knee: Secondary | ICD-10-CM | POA: Diagnosis not present

## 2022-06-26 DIAGNOSIS — M1712 Unilateral primary osteoarthritis, left knee: Secondary | ICD-10-CM | POA: Diagnosis not present

## 2022-06-29 DIAGNOSIS — M25562 Pain in left knee: Secondary | ICD-10-CM | POA: Diagnosis not present

## 2022-06-29 DIAGNOSIS — M25662 Stiffness of left knee, not elsewhere classified: Secondary | ICD-10-CM | POA: Diagnosis not present

## 2022-06-29 DIAGNOSIS — R2689 Other abnormalities of gait and mobility: Secondary | ICD-10-CM | POA: Diagnosis not present

## 2022-07-02 DIAGNOSIS — M25562 Pain in left knee: Secondary | ICD-10-CM | POA: Diagnosis not present

## 2022-07-02 DIAGNOSIS — M25662 Stiffness of left knee, not elsewhere classified: Secondary | ICD-10-CM | POA: Diagnosis not present

## 2022-07-02 DIAGNOSIS — R2689 Other abnormalities of gait and mobility: Secondary | ICD-10-CM | POA: Diagnosis not present

## 2022-07-04 DIAGNOSIS — M25562 Pain in left knee: Secondary | ICD-10-CM | POA: Diagnosis not present

## 2022-07-04 DIAGNOSIS — M25662 Stiffness of left knee, not elsewhere classified: Secondary | ICD-10-CM | POA: Diagnosis not present

## 2022-07-04 DIAGNOSIS — R2689 Other abnormalities of gait and mobility: Secondary | ICD-10-CM | POA: Diagnosis not present

## 2022-07-06 DIAGNOSIS — M25562 Pain in left knee: Secondary | ICD-10-CM | POA: Diagnosis not present

## 2022-07-06 DIAGNOSIS — M25662 Stiffness of left knee, not elsewhere classified: Secondary | ICD-10-CM | POA: Diagnosis not present

## 2022-07-06 DIAGNOSIS — R2689 Other abnormalities of gait and mobility: Secondary | ICD-10-CM | POA: Diagnosis not present

## 2022-07-11 DIAGNOSIS — I10 Essential (primary) hypertension: Secondary | ICD-10-CM | POA: Diagnosis not present

## 2022-07-11 DIAGNOSIS — R7309 Other abnormal glucose: Secondary | ICD-10-CM | POA: Diagnosis not present

## 2022-07-11 DIAGNOSIS — E1122 Type 2 diabetes mellitus with diabetic chronic kidney disease: Secondary | ICD-10-CM | POA: Diagnosis not present

## 2022-07-11 DIAGNOSIS — N1832 Chronic kidney disease, stage 3b: Secondary | ICD-10-CM | POA: Diagnosis not present

## 2022-09-04 ENCOUNTER — Other Ambulatory Visit (HOSPITAL_COMMUNITY): Payer: Self-pay | Admitting: Sports Medicine

## 2022-09-04 DIAGNOSIS — M8580 Other specified disorders of bone density and structure, unspecified site: Secondary | ICD-10-CM

## 2022-09-19 ENCOUNTER — Ambulatory Visit (HOSPITAL_COMMUNITY)
Admission: RE | Admit: 2022-09-19 | Discharge: 2022-09-19 | Disposition: A | Discharge status: Home / Self Care | Payer: Medicare HMO | Source: Ambulatory Visit | Attending: Sports Medicine | Admitting: Sports Medicine

## 2022-09-19 ENCOUNTER — Inpatient Hospital Stay (HOSPITAL_COMMUNITY): Admission: RE | Admit: 2022-09-19 | Payer: Medicare HMO | Source: Ambulatory Visit

## 2022-09-19 DIAGNOSIS — Z853 Personal history of malignant neoplasm of breast: Secondary | ICD-10-CM | POA: Insufficient documentation

## 2022-09-19 DIAGNOSIS — M85852 Other specified disorders of bone density and structure, left thigh: Secondary | ICD-10-CM | POA: Insufficient documentation

## 2022-09-19 DIAGNOSIS — Z13820 Encounter for screening for osteoporosis: Secondary | ICD-10-CM | POA: Insufficient documentation

## 2022-09-19 DIAGNOSIS — Z780 Asymptomatic menopausal state: Secondary | ICD-10-CM | POA: Insufficient documentation

## 2022-09-19 DIAGNOSIS — M8589 Other specified disorders of bone density and structure, multiple sites: Secondary | ICD-10-CM | POA: Diagnosis not present

## 2022-09-19 DIAGNOSIS — M8580 Other specified disorders of bone density and structure, unspecified site: Secondary | ICD-10-CM

## 2022-09-20 ENCOUNTER — Encounter: Payer: Self-pay | Admitting: Gastroenterology

## 2022-09-24 DIAGNOSIS — M810 Age-related osteoporosis without current pathological fracture: Secondary | ICD-10-CM | POA: Diagnosis not present

## 2022-09-25 ENCOUNTER — Other Ambulatory Visit: Payer: Self-pay

## 2022-09-25 DIAGNOSIS — M810 Age-related osteoporosis without current pathological fracture: Secondary | ICD-10-CM | POA: Insufficient documentation

## 2022-09-26 ENCOUNTER — Telehealth: Payer: Self-pay

## 2022-09-26 NOTE — Telephone Encounter (Signed)
Auth Submission: APPROVED Site of care: Site of care: [facility] Payer: [employer] medicare Medication & CPT/J Code(s) submitted: Prolia (Denosumab) [id number] Route of submission (phone, fax, portal): portal Phone # Fax # Auth type: Buy/Bill PB Units/visits requested: 60mg , q61months Reference number: [id number] Approval from: 09/26/22 to 09/26/23

## 2022-09-27 ENCOUNTER — Encounter: Payer: Medicare HMO | Attending: Sports Medicine | Admitting: *Deleted

## 2022-09-27 ENCOUNTER — Other Ambulatory Visit: Payer: Self-pay | Admitting: Internal Medicine

## 2022-09-27 VITALS — BP 120/79 | HR 72 | Temp 98.4°F | Resp 18

## 2022-09-27 DIAGNOSIS — M810 Age-related osteoporosis without current pathological fracture: Secondary | ICD-10-CM

## 2022-09-27 DIAGNOSIS — Z1231 Encounter for screening mammogram for malignant neoplasm of breast: Secondary | ICD-10-CM

## 2022-09-27 MED ORDER — DENOSUMAB 60 MG/ML ~~LOC~~ SOSY
60.0000 mg | PREFILLED_SYRINGE | Freq: Once | SUBCUTANEOUS | Status: AC
  Administered 2022-09-27: 60 mg via SUBCUTANEOUS

## 2022-09-27 NOTE — Progress Notes (Signed)
Diagnosis: Osteoporosis  Provider:  Albertha Ghee MD  Procedure: Injection  Prolia (Denosumab), Dose: 60 mg, Site: subcutaneous, Number of injections: 1  Post Care: Observation period completed  Discharge: Condition: Good, Destination: Home . AVS Provided  Performed by:  Daleen Squibb, RN

## 2022-10-04 DIAGNOSIS — Z79899 Other long term (current) drug therapy: Secondary | ICD-10-CM | POA: Diagnosis not present

## 2022-10-04 DIAGNOSIS — E1129 Type 2 diabetes mellitus with other diabetic kidney complication: Secondary | ICD-10-CM | POA: Diagnosis not present

## 2022-10-04 DIAGNOSIS — I10 Essential (primary) hypertension: Secondary | ICD-10-CM | POA: Diagnosis not present

## 2022-10-04 DIAGNOSIS — N1832 Chronic kidney disease, stage 3b: Secondary | ICD-10-CM | POA: Diagnosis not present

## 2022-10-08 NOTE — Progress Notes (Signed)
History of Present Illness: Here for f/u of OAB. Currently on trospium as well as Gemtesa. At April visit I recommended PTNS--she did not go for scheduled course.   Past Medical History:  Diagnosis Date   Ankylosis of left knee    Arthritis    R hand    Breast cancer (HCC)    left   Cancer (HCC)    breast CA- diagnosed with needle biopsy   Depression    High cholesterol 10/17/2015   History of radiation therapy 03/27/17- 04/23/17   Left Breast, 2.67 Gy in 15 fractions for a total dose of 40.05 Gy. Boost, 2 Gy in 5 fractions for a total dose of 10 Gy.    Hypertension    Hypothyroidism    Pre-diabetes    Hgba1C- in the 6 's , per pt., she monitors her diet    Past Surgical History:  Procedure Laterality Date   BACK SURGERY  1973   lower   BREAST BIOPSY Left 02/04/2017   BREAST BIOPSY Right 11/29/2021   MM RT BREAST BX W LOC DEV 1ST LESION IMAGE BX SPEC STEREO GUIDE 11/29/2021 [facility]   BREAST LUMPECTOMY Left 02/2017   BREAST LUMPECTOMY WITH RADIOACTIVE SEED AND SENTINEL LYMPH NODE BIOPSY Left 02/27/2017   Procedure: LEFT BREAST LUMPECTOMY WITH RADIOACTIVE SEED AND LEFT AXILLARY SENTINEL LYMPH NODE BIOPSY;  Surgeon: Manus Rudd, MD;  Location: [facility];  Service: General;  Laterality: Left;   COLONOSCOPY     X 2   COLONOSCOPY N/A 02/22/2016   Procedure: COLONOSCOPY;  Surgeon: Malissa Hippo, MD;  [facility];  Service: Endoscopy;  Laterality: N/A;  1:45   KNEE CLOSED REDUCTION Left 06/11/2022   Procedure: CLOSED MANIPULATION KNEE;  Surgeon: [name], MD;  Location: [facility];  Service: Orthopedics;  Laterality: Left;   Left shoultder surgery     for bone spurs & frozen shoulder    REVERSE SHOULDER ARTHROPLASTY Left 09/14/2020   Procedure: REVERSE SHOULDER ARTHROPLASTY;  Surgeon: Bjorn Pippin, MD;  Location: [facility];  Service: Orthopedics;  Laterality: Left;   Right bunionectomy     TONSILLECTOMY     TOTAL KNEE ARTHROPLASTY Left  04/02/2022   Procedure: TOTAL KNEE ARTHROPLASTY;  Surgeon: [name], MD;  Location: [facility];  Service: Orthopedics;  Laterality: Left;   TUBAL LIGATION      Home Medications:  Allergies as of 10/09/2022       Reactions   Penicillins Rash   Has patient had a PCN reaction causing immediate rash, facial/tongue/throat swelling, SOB or lightheadedness with hypotension: No Has patient had a PCN reaction causing severe rash involving mucus membranes or skin necrosis: No Has patient had a PCN reaction that required hospitalization: No Has patient had a PCN reaction occurring within the last 10 years: No If all of the above answers are "NO", then may proceed with Cephalosporin use.        Medication List        Accurate as of October 08, 2022 11:22 AM. If you have any questions, ask your nurse or doctor.          acetaminophen 650 MG CR tablet Commonly known as: TYLENOL Take 1,300 mg by mouth every 8 (eight) hours as needed for pain.   aspirin EC 81 MG tablet Take 81 mg by mouth daily. Swallow whole.   atorvastatin 20 MG tablet Commonly known as: LIPITOR Take 20 mg by mouth in the morning.  CALCIUM 600+D PO Take 1 tablet by mouth at bedtime.   Gemtesa 75 MG Tabs Generic drug: Vibegron TAKE 1 TABLET BY MOUTH AT 2PM What changed: See the new instructions.   letrozole 2.5 MG tablet Commonly known as: FEMARA TAKE 1 TABLET BY MOUTH EVERY DAY   levothyroxine 88 MCG tablet Commonly known as: SYNTHROID Take 88 mcg by mouth daily before breakfast.   mirtazapine 15 MG tablet Commonly known as: REMERON Take 15 mg by mouth at bedtime.   olmesartan 20 MG tablet Commonly known as: BENICAR Take 20 mg by mouth daily.   Prevagen 10 MG Caps Generic drug: Apoaequorin Take 10 mg by mouth in the morning.   senna-docusate 8.6-50 MG tablet Commonly known as: Senokot-S Take 1 tablet by mouth daily.   trospium 20 MG tablet Commonly known as: SANCTURA TAKE 1 TABLET BY  MOUTH TWICE A DAY   Vitamin D-3 125 MCG (5000 UT) Tabs Take 5,000 Units by mouth in the morning.   Voltaren Arthritis Pain 1 % Gel Generic drug: diclofenac Sodium Apply 2 g topically in the morning. Knee   zolpidem 5 MG tablet Commonly known as: AMBIEN Take 2.5 tablets (12.5 mg total) by mouth at bedtime as needed for sleep. What changed:  how much to take when to take this        Allergies:  Allergies  Allergen Reactions   Penicillins Rash    Has patient had a PCN reaction causing immediate rash, facial/tongue/throat swelling, SOB or lightheadedness with hypotension: No Has patient had a PCN reaction causing severe rash involving mucus membranes or skin necrosis: No Has patient had a PCN reaction that required hospitalization: No Has patient had a PCN reaction occurring within the last 10 years: No If all of the above answers are "NO", then may proceed with Cephalosporin use.     Family History  Problem Relation Age of Onset   Colon cancer Neg Hx     Social History:  reports that she has never smoked. She has never been exposed to tobacco smoke. She has never used smokeless tobacco. She reports current alcohol use. She reports that she does not use drugs.  ROS: A complete review of systems was performed.  All systems are negative except for pertinent findings as noted.  Physical Exam:  Vital signs in last 24 hours: There were no vitals taken for this visit. Constitutional:  Alert and oriented, No acute distress Cardiovascular: Regular rate  Respiratory: Normal respiratory effort GI: Abdomen is soft, nontender, nondistended, no abdominal masses. No CVAT.  Genitourinary: Normal female phallus, testes are descended bilaterally and non-tender and without masses, scrotum is normal in appearance without lesions or masses, perineum is normal on inspection. Lymphatic: No lymphadenopathy Neurologic: Grossly intact, no focal deficits Psychiatric: Normal mood and affect  I  have reviewed prior pt notes  I have reviewed notes from referring/previous physicians  I have reviewed urinalysis results  I have independently reviewed prior imaging  I have reviewed prior PSA results  I have reviewed prior urine culture   Impression/Assessment:  ***  Plan:  ***

## 2022-10-09 ENCOUNTER — Ambulatory Visit (INDEPENDENT_AMBULATORY_CARE_PROVIDER_SITE_OTHER): Payer: Medicare HMO | Admitting: Urology

## 2022-10-09 ENCOUNTER — Encounter: Payer: Self-pay | Admitting: Urology

## 2022-10-09 VITALS — BP 122/68 | HR 77

## 2022-10-09 DIAGNOSIS — N3281 Overactive bladder: Secondary | ICD-10-CM

## 2022-10-09 DIAGNOSIS — R3915 Urgency of urination: Secondary | ICD-10-CM | POA: Diagnosis not present

## 2022-10-09 DIAGNOSIS — R350 Frequency of micturition: Secondary | ICD-10-CM

## 2022-10-09 LAB — BLADDER SCAN AMB NON-IMAGING: Scan Result: 36

## 2022-10-10 LAB — URINALYSIS, ROUTINE W REFLEX MICROSCOPIC
Bilirubin, UA: NEGATIVE
Glucose, UA: NEGATIVE
Ketones, UA: NEGATIVE
Leukocytes,UA: NEGATIVE
Nitrite, UA: NEGATIVE
Protein,UA: NEGATIVE
RBC, UA: NEGATIVE
Specific Gravity, UA: 1.02 (ref 1.005–1.030)
Urobilinogen, Ur: 0.2 mg/dL (ref 0.2–1.0)
pH, UA: 6 (ref 5.0–7.5)

## 2022-10-12 DIAGNOSIS — Z23 Encounter for immunization: Secondary | ICD-10-CM | POA: Diagnosis not present

## 2022-10-12 DIAGNOSIS — I10 Essential (primary) hypertension: Secondary | ICD-10-CM | POA: Diagnosis not present

## 2022-10-12 DIAGNOSIS — E1122 Type 2 diabetes mellitus with diabetic chronic kidney disease: Secondary | ICD-10-CM | POA: Diagnosis not present

## 2022-10-12 DIAGNOSIS — N1832 Chronic kidney disease, stage 3b: Secondary | ICD-10-CM | POA: Diagnosis not present

## 2022-10-15 ENCOUNTER — Other Ambulatory Visit (HOSPITAL_COMMUNITY): Payer: Self-pay | Admitting: Internal Medicine

## 2022-10-15 DIAGNOSIS — N183 Chronic kidney disease, stage 3 unspecified: Secondary | ICD-10-CM

## 2022-10-16 ENCOUNTER — Ambulatory Visit (INDEPENDENT_AMBULATORY_CARE_PROVIDER_SITE_OTHER): Payer: Medicare HMO | Admitting: Gastroenterology

## 2022-10-16 ENCOUNTER — Encounter: Payer: Self-pay | Admitting: Gastroenterology

## 2022-10-16 VITALS — BP 99/60 | HR 87 | Temp 97.7°F | Ht 62.0 in | Wt 136.4 lb

## 2022-10-16 DIAGNOSIS — K5900 Constipation, unspecified: Secondary | ICD-10-CM | POA: Diagnosis not present

## 2022-10-16 DIAGNOSIS — K649 Unspecified hemorrhoids: Secondary | ICD-10-CM

## 2022-10-16 DIAGNOSIS — K640 First degree hemorrhoids: Secondary | ICD-10-CM

## 2022-10-16 DIAGNOSIS — K625 Hemorrhage of anus and rectum: Secondary | ICD-10-CM | POA: Diagnosis not present

## 2022-10-16 NOTE — Progress Notes (Signed)
GI Office Note    Referring Provider: Carylon Perches, MD Primary Care Physician:  Carylon Perches, MD Primary Gastroenterologist: Dolores Frame, MD  Date:  10/16/2022  ID:  Breanna Hill, DOB [birth year], MRN [id number]   Chief Complaint   Chief Complaint  Patient presents with   Follow-up    Follow up. Having problems with constipation   History of Present Illness  Breanna Hill is a 82 y.o. female with a history of breast cancer, arthritis, depression, GERD, HTN, HLD, hypothyroidism, constipation, and prediabetes presenting today for follow-up.   Per review of prior office notes she had reported issues with increasing constipation and has tried Dulcolax and alternating with other laxatives over-the-counter.  Had noticed some improvement with fiber.  Also stating rectum felt tight and had to self disimpact stools.  Had had some improvement with Linzess in the past however on follow-up had reported it was too strong.  Last seen April 2024.  Patient reported she excellently put a cleaner in her eyes instead of eyedrops and was having some vision issues but currently using antibiotic cream.  In regards to her constipation she feels the increase water intake is in the living that helps as long as she stays hydrated she does not have any issues.  Occasionally having liquid stools but not often.  Denies any abdominal pain, melena, or BRBPR.  Continue to take 1-2 Senokot daily and has stopped Metamucil.  Occasionally taking MiraLAX.  Continuing to have a lack of appetite and was actively working on weight loss, had recently lost about 20 pounds..  Occasional GERD but nothing frequent.  Advise continue adequate hydration, continue Senokot, and continue to follow a high-fiber diet.  Today: Not having any gas or abdominal pain.  Did need to strain with 2 episodes of constipation since her last visit.  Again reinforces that Linzess has been too strong for her in the past. Taking fiber and  miralax daily and as long as she drinks water she usually does okay.   When she has bouts of constipation she has severe rectal pain and difficulty passing stools. Will take chewable dulcolax and extra miralax and will eventually go.  Total both instances she took about 8 laxatives altogether within 2 days.  She will have bleeding from hemorrhoids when that occurs as well. This sometimes occurs if she goes more than 2 days without a BM. Went to use an enema but at the time she was having knee issues so was unable to reach back.  No issue with hemorrhoids unless she has that large hard stool and need to strain.   Current Outpatient Medications  Medication Sig Dispense Refill   acetaminophen (TYLENOL) 650 MG CR tablet Take 1,300 mg by mouth every 8 (eight) hours as needed for pain.     Apoaequorin (PREVAGEN) 10 MG CAPS Take 10 mg by mouth in the morning.     aspirin EC 81 MG tablet Take 81 mg by mouth daily. Swallow whole.     atorvastatin (LIPITOR) 20 MG tablet Take 20 mg by mouth in the morning.     Calcium Carbonate-Vitamin D (CALCIUM 600+D PO) Take 1 tablet by mouth at bedtime.     Cholecalciferol (VITAMIN D-3) 125 MCG (5000 UT) TABS Take 5,000 Units by mouth in the morning.     diclofenac Sodium (VOLTAREN ARTHRITIS PAIN) 1 % GEL Apply 2 g topically in the morning. Knee     GEMTESA 75 MG TABS TAKE 1 TABLET BY MOUTH AT  2PM (Patient taking differently: Take 75 mg by mouth every Monday, Wednesday, and Friday. At bedtime) 90 tablet 3   letrozole (FEMARA) 2.5 MG tablet TAKE 1 TABLET BY MOUTH EVERY DAY 90 tablet 3   levothyroxine (SYNTHROID) 88 MCG tablet Take 88 mcg by mouth daily before breakfast.     mirtazapine (REMERON) 15 MG tablet Take 15 mg by mouth at bedtime.     olmesartan (BENICAR) 20 MG tablet Take 20 mg by mouth daily.     senna-docusate (SENOKOT-S) 8.6-50 MG tablet Take 1 tablet by mouth daily.     trospium (SANCTURA) 20 MG tablet TAKE 1 TABLET BY MOUTH TWICE A DAY 180 tablet 3    zolpidem (AMBIEN) 5 MG tablet Take 2.5 tablets (12.5 mg total) by mouth at bedtime as needed for sleep. (Patient taking differently: Take 10 mg by mouth at bedtime.) 30 tablet 0   No current facility-administered medications for this visit.    Past Medical History:  Diagnosis Date   Ankylosis of left knee    Arthritis    R hand    Breast cancer (HCC)    left   Cancer (HCC)    breast CA- diagnosed with needle biopsy   Depression    High cholesterol 10/17/2015   History of radiation therapy 03/27/17- 04/23/17   Left Breast, 2.67 Gy in 15 fractions for a total dose of 40.05 Gy. Boost, 2 Gy in 5 fractions for a total dose of 10 Gy.    Hypertension    Hypothyroidism    Pre-diabetes    Hgba1C- in the 6 's , per pt., she monitors her diet    Past Surgical History:  Procedure Laterality Date   BACK SURGERY  1973   lower   BREAST BIOPSY Left 02/04/2017   BREAST BIOPSY Right 11/29/2021   MM RT BREAST BX W LOC DEV 1ST LESION IMAGE BX SPEC STEREO GUIDE 11/29/2021 [facility]   BREAST LUMPECTOMY Left 02/2017   BREAST LUMPECTOMY WITH RADIOACTIVE SEED AND SENTINEL LYMPH NODE BIOPSY Left 02/27/2017   Procedure: LEFT BREAST LUMPECTOMY WITH RADIOACTIVE SEED AND LEFT AXILLARY SENTINEL LYMPH NODE BIOPSY;  Surgeon: Manus Rudd, MD;  Location: [facility];  Service: General;  Laterality: Left;   COLONOSCOPY     X 2   COLONOSCOPY N/A 02/22/2016   Procedure: COLONOSCOPY;  Surgeon: Malissa Hippo, MD;  [facility];  Service: Endoscopy;  Laterality: N/A;  1:45   KNEE CLOSED REDUCTION Left 06/11/2022   Procedure: CLOSED MANIPULATION KNEE;  Surgeon: [name], MD;  Location: [facility] ;  Service: Orthopedics;  Laterality: Left;   Left shoultder surgery     for bone spurs & frozen shoulder    REVERSE SHOULDER ARTHROPLASTY Left 09/14/2020   Procedure: REVERSE SHOULDER ARTHROPLASTY;  Surgeon: Bjorn Pippin, MD;  Location: [facility];  Service: Orthopedics;   Laterality: Left;   Right bunionectomy     TONSILLECTOMY     TOTAL KNEE ARTHROPLASTY Left 04/02/2022   Procedure: TOTAL KNEE ARTHROPLASTY;  Surgeon: [name], MD;  Location: [facility];  Service: Orthopedics;  Laterality: Left;   TUBAL LIGATION      Family History  Problem Relation Age of Onset   Colon cancer Neg Hx     Allergies as of 10/16/2022 - Review Complete 10/16/2022  Allergen Reaction Noted   Penicillins Rash     Social History   Socioeconomic History   Marital status: Widowed    Spouse name: Not  on file   Number of children: 2   Years of education: 12   Highest education level: 12th grade  Occupational History   Not on file  Tobacco Use   Smoking status: Never    Passive exposure: Never   Smokeless tobacco: Never  Vaping Use   Vaping status: Never Used  Substance and Sexual Activity   Alcohol use: Yes    Comment: occasional   Drug use: No   Sexual activity: Not Currently    Partners: Male    Birth control/protection: Post-menopausal  Other Topics Concern   Not on file  Social History Narrative   Not on file   Social Determinants of Health   Financial Resource Strain: Low Risk  (08/10/2021)   Overall Financial Resource Strain (CARDIA)    Difficulty of Paying Living Expenses: Not hard at all  Food Insecurity: No Food Insecurity (04/02/2022)   Hunger Vital Sign    Worried About Running Out of Food in the Last Year: Never true    Ran Out of Food in the Last Year: Never true  Transportation Needs: No Transportation Needs (04/02/2022)   PRAPARE - Administrator, Civil Service (Medical): No    Lack of Transportation (Non-Medical): No  Physical Activity: Inactive (08/10/2021)   Exercise Vital Sign    Days of Exercise per Week: 0 days    Minutes of Exercise per Session: 0 min  Stress: No Stress Concern Present (08/10/2021)   [employer] of Occupational Health - Occupational Stress Questionnaire    Feeling of Stress : Only a little   Social Connections: Moderately Integrated (08/10/2021)   Social Connection and Isolation Panel [NHANES]    Frequency of Communication with Friends and Family: More than three times a week    Frequency of Social Gatherings with Friends and Family: More than three times a week    Attends Religious Services: Never    Database administrator or Organizations: Yes    Attends Engineer, structural: More than 4 times per year    Marital Status: Married     Review of Systems   Gen: Denies fever, chills, anorexia. Denies fatigue, weakness, weight loss.  CV: Denies chest pain, palpitations, syncope, peripheral edema, and claudication. Resp: Denies dyspnea at rest, cough, wheezing, coughing up blood, and pleurisy. GI: See HPI Derm: Denies rash, itching, dry skin Psych: Denies depression, anxiety, memory loss, confusion. No homicidal or suicidal ideation.  Heme: Denies bruising, bleeding, and enlarged lymph nodes.  Physical Exam   BP 99/60 (BP Location: Right Arm, Patient Position: Sitting, Cuff Size: Normal)   Pulse 87   Temp 97.7 F (36.5 C) (Temporal)   Ht 5\' 2"  (1.575 m)   Wt 136 lb 6.4 oz (61.9 kg)   SpO2 98%   BMI 24.95 kg/m   General:   Alert and oriented. No distress noted. Pleasant and cooperative.  Head:  Normocephalic and atraumatic. Eyes:  Conjuctiva clear without scleral icterus. Mouth:  Oral mucosa pink and moist. Good dentition. No lesions. Abdomen:  +BS, soft, non-tender and non-distended. No rebound or guarding. No HSM or masses noted. Rectal: deferred Msk:  Symmetrical without gross deformities. Normal posture. Extremities:  Without edema. Neurologic:  Alert and  oriented x4 Psych:  Alert and cooperative. Normal mood and affect.   Assessment  Breanna Hill is a 82 y.o. female with a history of breast cancer, arthritis, depression, GERD, HTN, HLD, hypothyroidism, constipation, and prediabetes presenting today for follow-up.  Constipation: Constipation  fairly well-controlled with hydration, daily MiraLAX, and once daily fiber supplements.  Has only had 2 episodes of severe constipation since her last office visit but with the use instances she has required multiple doses of over-the-counter laxatives without good relief and ultimately needs digital stimulation with disimpaction for relief.  When this occurs she also has a flare of hemorrhoids and rectal bleeding.  We discussed increasing fiber to twice daily and if needed to increase MiraLAX to twice daily and ensure she is staying hydrated on a daily basis.  We discussed methods for what to do if no bowel movement within 24-48 hours and this is outlined in her AVS.  Ultimately decided that she should use Linzess to help with emptying if no bowel movement after 3 doses of MiraLAX.  We also discussed avoiding manual disimpaction and using enemas or suppositories to help with rectal stimulation.  Hemorrhoids: Fairly well-controlled as long as constipation is controlled.  2 episodes of rectal bleeding have coincided with severe bouts of constipation and self disimpaction.  PLAN   Increase fiber to twice daily Continue miralax once daily, increase to twice daily if needed.  Increase hydration as much as able If after 3 doses of MiraLAX, no bowel movement then advised her to take Linzess. Enema or suppositories as needed Follow up in 6 months.    Brooke Bonito, MSN, FNP-BC, AGACNP-BC [facility]  I have reviewed the note and agree with the APP's assessment as described in this progress note  If patient presents persistent constipation despite optimizing the intake of MiraLAX, could attempt trying again Linzess or trying other prescription laxatives such as low-dose Amitiza or Trulance.  Katrinka Blazing, MD Gastroenterology and Hepatology [facility]

## 2022-10-16 NOTE — Patient Instructions (Signed)
I recommend that you increase your fiber to 1 tablespoon twice daily while continuing to take MiraLAX 17 g once daily.  If you do not have a bowel movement after your normal bowel regimen I would recommend an increased dose of MiraLAX.  If after 3 doses of MiraLAX and no bowel movement then I would recommend you go ahead and take Linzess.  If this process becomes more frequent then I would recommend that you take fiber twice daily as well as MiraLAX twice daily.  If for any reason you get desperate during this process and are struggling with straining I would rather you avoid self disimpaction therefore I would recommend a Fleet enema or a glycerin suppository.  I will plan to see you again in 6 months, sooner if needed.  Please do not hesitate to reach out if you need any recommendations or clarification on what to do in regards to your constipation.  It was a pleasure to see you today. I want to create trusting relationships with patients. If you receive a survey regarding your visit,  I greatly appreciate you taking time to fill this out on paper or through your MyChart. I value your feedback.  Brooke Bonito, MSN, FNP-BC, AGACNP-BC [facility]

## 2022-10-23 ENCOUNTER — Encounter: Payer: Self-pay | Admitting: Sports Medicine

## 2022-10-23 ENCOUNTER — Ambulatory Visit (HOSPITAL_COMMUNITY)
Admission: RE | Admit: 2022-10-23 | Discharge: 2022-10-23 | Disposition: A | Discharge status: Home / Self Care | Payer: Medicare HMO | Source: Ambulatory Visit | Attending: Internal Medicine | Admitting: Internal Medicine

## 2022-10-23 DIAGNOSIS — N183 Chronic kidney disease, stage 3 unspecified: Secondary | ICD-10-CM | POA: Insufficient documentation

## 2022-10-23 DIAGNOSIS — N281 Cyst of kidney, acquired: Secondary | ICD-10-CM | POA: Diagnosis not present

## 2022-10-31 ENCOUNTER — Other Ambulatory Visit: Payer: Self-pay | Admitting: Hematology and Oncology

## 2022-10-31 ENCOUNTER — Telehealth: Payer: Self-pay | Admitting: Hematology and Oncology

## 2022-10-31 NOTE — Telephone Encounter (Signed)
Left patient a message regarding scheduled appointment times/dates for follow up visit with [facility]

## 2022-11-05 ENCOUNTER — Ambulatory Visit (INDEPENDENT_AMBULATORY_CARE_PROVIDER_SITE_OTHER): Payer: Medicare HMO

## 2022-11-05 DIAGNOSIS — N3281 Overactive bladder: Secondary | ICD-10-CM | POA: Diagnosis not present

## 2022-11-05 NOTE — Progress Notes (Signed)
PTNS  Session # 1 of 12  Patient Goals: reduced urgency, reduced frequency, no accidents  Health & Social Factors: none Caffeine: 2 Alcohol: 1 Daytime voids #per day: 10 Night-time voids #per night: 4 Urgency: strong Incontinence Episodes #per day: n/a Treatment Plan/Comments: n/a  Ankle used: right Treatment Setting: 1 Feeling/ Response: sensory   Performed By: Guss Bunde, CMA  Follow Up: as scheduled.

## 2022-11-05 NOTE — Patient Instructions (Signed)

## 2022-11-09 ENCOUNTER — Ambulatory Visit: Payer: Medicare HMO

## 2022-11-12 ENCOUNTER — Ambulatory Visit (INDEPENDENT_AMBULATORY_CARE_PROVIDER_SITE_OTHER): Payer: Medicare HMO

## 2022-11-12 DIAGNOSIS — N3281 Overactive bladder: Secondary | ICD-10-CM

## 2022-11-12 NOTE — Patient Instructions (Signed)

## 2022-11-12 NOTE — Progress Notes (Signed)
PTNS  Session # 2 of 12  Patient Goals: reduced urgency, reduced frequency, no accidents  Health & Social Factors: no change Caffeine: 2 Alcohol: 1 Daytime voids #per day: 10 Night-time voids #per night: 4 Urgency: strong Incontinence Episodes #per day: n/a Treatment Plan/Comments: n/a  Ankle used: left Treatment Setting: 2 Feeling/ Response: sensory   Performed By: Guss Bunde, CMA  Follow Up: as scheduled

## 2022-11-19 ENCOUNTER — Ambulatory Visit (INDEPENDENT_AMBULATORY_CARE_PROVIDER_SITE_OTHER): Payer: Medicare HMO

## 2022-11-19 DIAGNOSIS — N3281 Overactive bladder: Secondary | ICD-10-CM

## 2022-11-19 NOTE — Progress Notes (Signed)
PTNS  Session # 3 of  12  Patient Goals: reduce urgency, no accidents  Health & Social Factors: n/a Caffeine: 2 Alcohol: 0 Daytime voids #per day: 10 Night-time voids #per night: 3 to 4 Urgency: still strong Incontinence Episodes #per day: n/a Treatment Plan/Comments: continue ptns  Ankle used: right Treatment Setting: 2-3 Feeling/ Response: tingling    Performed By: [facility] LPN  Follow Up: keep scheduled NV

## 2022-11-23 ENCOUNTER — Ambulatory Visit
Admission: RE | Admit: 2022-11-23 | Discharge: 2022-11-23 | Disposition: A | Discharge status: Home / Self Care | Payer: Medicare HMO | Source: Ambulatory Visit | Attending: Internal Medicine | Admitting: Internal Medicine

## 2022-11-23 ENCOUNTER — Encounter: Payer: Self-pay | Admitting: Sports Medicine

## 2022-11-23 DIAGNOSIS — Z1231 Encounter for screening mammogram for malignant neoplasm of breast: Secondary | ICD-10-CM

## 2022-11-26 ENCOUNTER — Ambulatory Visit (INDEPENDENT_AMBULATORY_CARE_PROVIDER_SITE_OTHER): Payer: Medicare HMO

## 2022-11-26 DIAGNOSIS — N3281 Overactive bladder: Secondary | ICD-10-CM

## 2022-11-26 NOTE — Patient Instructions (Signed)

## 2022-11-26 NOTE — Progress Notes (Signed)
PTNS  Session # 4 of 12  Patient Goals: reduced urgency, reduced frequency, no accidents.  Health & Social Factors: no change Caffeine: 6 Alcohol: 1 Daytime voids #per day: 8 Night-time voids #per night: 2 Urgency: strong Incontinence Episodes #per day: n/a Treatment Plan/Comments: n/a  Ankle used: right Treatment Setting: 1 Feeling/ Response: sensory   Performed By: Hope, LPN  Follow Up: as scheduled.

## 2022-12-03 ENCOUNTER — Ambulatory Visit (INDEPENDENT_AMBULATORY_CARE_PROVIDER_SITE_OTHER): Payer: Medicare HMO

## 2022-12-03 DIAGNOSIS — N3281 Overactive bladder: Secondary | ICD-10-CM | POA: Diagnosis not present

## 2022-12-03 NOTE — Addendum Note (Signed)
Addended by: Christoper Fabian R on: 12/03/2022 02:22 PM   Modules accepted: Level of Service

## 2022-12-03 NOTE — Progress Notes (Signed)
PTNS  Session # 5  Health & Social Factors: No Caffeine: 0  Alcohol: 1 Daytime voids #per day: 0 Night-time voids #per night: 11 Urgency: strong Incontinence Episodes #per day: none this week Ankle used: right Treatment Setting: 5 Feeling/ Response: positive response  Comments: N/A  Performed By: Lorette Ang  Follow Up: keep nurse visited

## 2022-12-10 ENCOUNTER — Other Ambulatory Visit: Payer: Self-pay | Admitting: Hematology and Oncology

## 2022-12-10 ENCOUNTER — Ambulatory Visit (INDEPENDENT_AMBULATORY_CARE_PROVIDER_SITE_OTHER): Payer: Medicare HMO

## 2022-12-10 DIAGNOSIS — N3281 Overactive bladder: Secondary | ICD-10-CM

## 2022-12-10 NOTE — Telephone Encounter (Signed)
Patient has appt on 12/10 and has not been seen since 02/2021.  Will refill at appt. Lorayne Marek, RN

## 2022-12-10 NOTE — Progress Notes (Signed)
PTNS  Session # 6  Health & Social Factors: no change Caffeine: decaf 2 daily Alcohol: 0 Daytime voids #per day: N/A Night-time voids #per night: 2 Urgency: Strong Incontinence Episodes #per day: N/A Ankle used: Right Treatment Setting: 9 Feeling/ Response: Sensory  Performed By: Gwendolyn Grant CMA & Hope LPN   Follow Up: As Scheduled

## 2022-12-10 NOTE — Patient Instructions (Signed)

## 2022-12-11 ENCOUNTER — Encounter: Payer: Self-pay | Admitting: Sports Medicine

## 2022-12-11 ENCOUNTER — Inpatient Hospital Stay: Payer: Medicare HMO | Attending: Hematology and Oncology | Admitting: Hematology and Oncology

## 2022-12-11 VITALS — BP 129/65 | HR 83 | Temp 97.4°F | Resp 18 | Ht 62.0 in | Wt 134.8 lb

## 2022-12-11 DIAGNOSIS — C50212 Malignant neoplasm of upper-inner quadrant of left female breast: Secondary | ICD-10-CM

## 2022-12-11 DIAGNOSIS — Z923 Personal history of irradiation: Secondary | ICD-10-CM | POA: Diagnosis not present

## 2022-12-11 DIAGNOSIS — M8580 Other specified disorders of bone density and structure, unspecified site: Secondary | ICD-10-CM | POA: Diagnosis not present

## 2022-12-11 DIAGNOSIS — Z170 Estrogen receptor positive status [ER+]: Secondary | ICD-10-CM | POA: Diagnosis not present

## 2022-12-11 DIAGNOSIS — Z79811 Long term (current) use of aromatase inhibitors: Secondary | ICD-10-CM | POA: Insufficient documentation

## 2022-12-11 DIAGNOSIS — M810 Age-related osteoporosis without current pathological fracture: Secondary | ICD-10-CM | POA: Diagnosis not present

## 2022-12-11 DIAGNOSIS — Z1721 Progesterone receptor positive status: Secondary | ICD-10-CM | POA: Diagnosis not present

## 2022-12-11 DIAGNOSIS — F329 Major depressive disorder, single episode, unspecified: Secondary | ICD-10-CM | POA: Diagnosis not present

## 2022-12-11 MED ORDER — LETROZOLE 2.5 MG PO TABS: 2.5000 mg | ORAL_TABLET | Freq: Every day | ORAL | 3 refills | Status: DC

## 2022-12-11 NOTE — Progress Notes (Signed)
Patient Care Team: Carylon Perches, MD as PCP - General (Internal Medicine) Laqueta Linden, MD (Inactive) as PCP - Cardiology (Cardiology) Serena Croissant, MD as Consulting Physician (Hematology and Oncology) Lonie Peak, MD as Attending Physician (Radiation Oncology) Manus Rudd, MD as Consulting Physician (General Surgery) Axel Filler Larna Daughters, NP as Nurse Practitioner (Hematology and Oncology)  DIAGNOSIS:  Encounter Diagnosis  Name Primary?   Malignant neoplasm of upper-inner quadrant of left breast in female, estrogen receptor positive (HCC) Yes    SUMMARY OF ONCOLOGIC HISTORY: Oncology History  Malignant neoplasm of upper-inner quadrant of left breast in female, estrogen receptor positive (HCC)  02/27/2017 Surgery   Left lumpectomy 02/27/2017: IDC grade 1, 1.1 cm, ALH, margins negative, 0/2 lymph nodes negative, ER 90%, PR 10%, HER-2 negative ratio 1.26, Ki-67 1%, T1CN0 stage I a    03/27/2017 - 04/30/2017 Radiation Therapy   Adjuvant radiation therapy   04/30/2017 -  Anti-estrogen oral therapy   Letrozole 2.5 mg daily x5 years     CHIEF COMPLIANT: Follow-up on letrozole therapy  HISTORY OF PRESENT ILLNESS:   History of Present Illness   The patient, a breast cancer survivor, has been on medication for approximately five and a half years. The patient was under the impression that the medication course was for five years, but was informed by the doctor that the course has been extended to seven years. The patient has also been receiving Prolia shots for osteoporosis, which has improved to osteopenia. The patient expressed concerns about having dense breasts and whether additional screening beyond mammograms is necessary.      ALLERGIES:  is allergic to penicillins.  MEDICATIONS:  Current Outpatient Medications  Medication Sig Dispense Refill   acetaminophen (TYLENOL) 650 MG CR tablet Take 1,300 mg by mouth every 8 (eight) hours as needed for pain.     Apoaequorin  (PREVAGEN) 10 MG CAPS Take 10 mg by mouth in the morning.     aspirin EC 81 MG tablet Take 81 mg by mouth daily. Swallow whole.     atorvastatin (LIPITOR) 20 MG tablet Take 20 mg by mouth in the morning.     Calcium Carbonate-Vitamin D (CALCIUM 600+D PO) Take 1 tablet by mouth at bedtime.     Cholecalciferol (VITAMIN D-3) 125 MCG (5000 UT) TABS Take 5,000 Units by mouth in the morning.     diclofenac Sodium (VOLTAREN ARTHRITIS PAIN) 1 % GEL Apply 2 g topically in the morning. Knee     GEMTESA 75 MG TABS TAKE 1 TABLET BY MOUTH AT 2PM (Patient taking differently: Take 75 mg by mouth every Monday, Wednesday, and Friday. At bedtime) 90 tablet 3   letrozole (FEMARA) 2.5 MG tablet Take 1 tablet (2.5 mg total) by mouth daily. 90 tablet 3   levothyroxine (SYNTHROID) 88 MCG tablet Take 88 mcg by mouth daily before breakfast.     mirtazapine (REMERON) 15 MG tablet Take 15 mg by mouth at bedtime.     olmesartan (BENICAR) 20 MG tablet Take 20 mg by mouth daily.     senna-docusate (SENOKOT-S) 8.6-50 MG tablet Take 1 tablet by mouth daily.     trospium (SANCTURA) 20 MG tablet TAKE 1 TABLET BY MOUTH TWICE A DAY 180 tablet 3   zolpidem (AMBIEN) 5 MG tablet Take 2.5 tablets (12.5 mg total) by mouth at bedtime as needed for sleep. (Patient taking differently: Take 10 mg by mouth at bedtime.) 30 tablet 0   No current facility-administered medications for this visit.  PHYSICAL EXAMINATION: ECOG PERFORMANCE STATUS: 1 - Symptomatic but completely ambulatory  Vitals:   12/11/22 1031  BP: 129/65  Pulse: 83  Resp: 18  Temp: (!) 97.4 F (36.3 C)  SpO2: 95%   Filed Weights   12/11/22 1031  Weight: 134 lb 12.8 oz (61.1 kg)    Physical Exam   BREAST: No abnormalities noted on examination under the arms.      (exam performed in the presence of a chaperone)  LABORATORY DATA:  I have reviewed the data as listed    Latest Ref Rng & Units 06/11/2022    8:25 AM 04/03/2022    3:43 AM 03/20/2022   11:13 AM   CMP  Glucose 70 - 99 mg/dL 540  981  191   BUN 8 - 23 mg/dL 25  16  27    Creatinine 0.44 - 1.00 mg/dL 4.78  2.95  6.21   Sodium 135 - 145 mmol/L 136  134  136   Potassium 3.5 - 5.1 mmol/L 4.0  3.7  4.0   Chloride 98 - 111 mmol/L 103  106  106   CO2 22 - 32 mmol/L 23  20  25    Calcium 8.9 - 10.3 mg/dL 9.4  8.0  9.2   Total Protein 6.5 - 8.1 g/dL 7.6   7.0   Total Bilirubin 0.3 - 1.2 mg/dL 0.7   0.6   Alkaline Phos 38 - 126 U/L 46   47   AST 15 - 41 U/L 23   23   ALT 0 - 44 U/L 22   23     Lab Results  Component Value Date   WBC 5.3 06/11/2022   HGB 12.9 06/11/2022   HCT 38.6 06/11/2022   MCV 92.1 06/11/2022   PLT 297 06/11/2022   NEUTROABS 3.3 06/11/2022    ASSESSMENT & PLAN:  Malignant neoplasm of upper-inner quadrant of left breast in female, estrogen receptor positive (HCC) Left lumpectomy 02/27/2017: IDC grade 1, 1.1 cm, ALH, margins negative, 0/2 lymph nodes negative, ER 90%, PR 10%, HER-2 negative ratio 1.26, Ki-67 1%, T1CN0 stage I a   Adjuvant radiation therapy 03/27/2017-04/30/2017   Treatment plan: Adjuvant antiestrogen therapy with letrozole 2.5 mg daily x5 years started 04/30/2017   Letrozole toxicities: Mild hot flashes I recommended stopping letrozole therapy since she completed 5 years.  She is close to taking it for 6 years at this point.   Husband died 2019/05/15. Shes grieving from his loss Major depression: We will refer her to psychiatry in [place]. Osteoporosis: On Prolia injections. Fracture of the shoulder: Receiving physical therapy with limitation of range of motion   Breast cancer surveillance: 1.  Mammogram 11/26/2022 benign breast density category C 2.  Bone density 09/19/2022: T-score -2.2: Osteopenia      Breast Cancer   In remission for approximately six years. Currently on year five and a half of a seven-year course of an unspecified medication.   -Continue current medication for another year and a half to complete the seven-year  course.    Osteoporosis   Improved to osteopenia with Prolia injections.   -Continue Prolia injections as managed by another provider.    Breast Density   Patient has category C breast density. Concerns about the need for additional imaging beyond 3D mammograms were addressed.   -Continue current surveillance with 3D mammograms as she is sufficient for this level of breast density.    Follow-up in 1 year.  No orders of the defined types were placed in this encounter.  The patient has a good understanding of the overall plan. she agrees with it. she will call with any problems that may develop before the next visit here. Total time spent: 30 mins including face to face time and time spent for planning, charting and co-ordination of care   Tamsen Meek, MD 12/11/22

## 2022-12-11 NOTE — Assessment & Plan Note (Signed)
Left lumpectomy 02/27/2017: IDC grade 1, 1.1 cm, ALH, margins negative, 0/2 lymph nodes negative, ER 90%, PR 10%, HER-2 negative ratio 1.26, Ki-67 1%, T1CN0 stage I a   Adjuvant radiation therapy 03/27/2017-04/30/2017   Treatment plan: Adjuvant antiestrogen therapy with letrozole 2.5 mg daily x5 years started 04/30/2017   Letrozole toxicities: Mild hot flashes I recommended stopping letrozole therapy since she completed 5 years.  She is close to taking it for 6 years at this point.   Husband died 05-10-19. Shes grieving from his loss Major depression: We will refer her to psychiatry in [place]. Osteoporosis: On Prolia injections. Fracture of the shoulder: Receiving physical therapy with limitation of range of motion   Breast cancer surveillance: 1.  Mammogram 11/26/2022 benign breast density category C 2.  Bone density 09/19/2022: T-score -2.2: Osteopenia   Return to clinic in 1 year for follow-up

## 2022-12-17 ENCOUNTER — Ambulatory Visit (INDEPENDENT_AMBULATORY_CARE_PROVIDER_SITE_OTHER): Payer: Medicare HMO

## 2022-12-17 DIAGNOSIS — N3281 Overactive bladder: Secondary | ICD-10-CM | POA: Diagnosis not present

## 2022-12-17 NOTE — Progress Notes (Signed)
PTNS  Session # 7 of 12  Health & Social Factors: no change Caffeine: 2 decaf daily Alcohol: 0 Daytime voids #per day: n/a Night-time voids #per night: 2 Urgency: strong Incontinence Episodes #per day: n/a Ankle used: right Treatment Setting: 2 Feeling/ Response: positive sensation in foot Comments: n/a  Performed By: Myka Lukins LPN  Follow Up:  keep scheduled NV

## 2022-12-31 ENCOUNTER — Encounter: Payer: Self-pay | Admitting: Sports Medicine

## 2022-12-31 ENCOUNTER — Ambulatory Visit: Payer: Medicare HMO

## 2022-12-31 ENCOUNTER — Ambulatory Visit (INDEPENDENT_AMBULATORY_CARE_PROVIDER_SITE_OTHER): Payer: Medicare HMO

## 2022-12-31 DIAGNOSIS — N3281 Overactive bladder: Secondary | ICD-10-CM | POA: Diagnosis not present

## 2022-12-31 DIAGNOSIS — R350 Frequency of micturition: Secondary | ICD-10-CM

## 2022-12-31 NOTE — Progress Notes (Signed)
PTNS  Session # 8  Health & Social Factors: No Change Caffeine: 1 Alcohol: 0 Daytime voids #per day: N/A Night-time voids #per night: 1 Urgency: strong Incontinence Episodes #per day: 0 Ankle used: Right Treatment Setting: 5 Feeling/ Response: sensory(big toe)   Performed By: Gwendolyn Grant CMA  Follow Up: As scheduled

## 2023-01-03 ENCOUNTER — Ambulatory Visit: Payer: Medicare HMO

## 2023-01-04 ENCOUNTER — Other Ambulatory Visit: Payer: Self-pay | Admitting: Urology

## 2023-01-04 DIAGNOSIS — R350 Frequency of micturition: Secondary | ICD-10-CM

## 2023-01-09 ENCOUNTER — Other Ambulatory Visit: Payer: Self-pay

## 2023-01-09 ENCOUNTER — Telehealth: Payer: Self-pay

## 2023-01-09 DIAGNOSIS — N3281 Overactive bladder: Secondary | ICD-10-CM

## 2023-01-09 MED ORDER — GEMTESA 75 MG PO TABS
75.0000 mg | ORAL_TABLET | Freq: Every day | ORAL | 3 refills | Status: AC
Start: 2023-01-09 — End: ?

## 2023-01-09 NOTE — Telephone Encounter (Signed)
 Patient is almost out of samples of GEMTESA 75 MG TABS  Please send Rx to   [id number] - [place],  - [address] AT [facility] Phone: [phone]  Fax: [phone]

## 2023-01-09 NOTE — Telephone Encounter (Signed)
 Called Pt to let her know we refilled her Rx

## 2023-01-10 ENCOUNTER — Ambulatory Visit: Payer: Medicare HMO

## 2023-01-17 ENCOUNTER — Ambulatory Visit: Payer: Medicare HMO

## 2023-01-24 ENCOUNTER — Ambulatory Visit: Payer: Medicare HMO

## 2023-02-06 DIAGNOSIS — Z79899 Other long term (current) drug therapy: Secondary | ICD-10-CM | POA: Diagnosis not present

## 2023-02-06 DIAGNOSIS — N1832 Chronic kidney disease, stage 3b: Secondary | ICD-10-CM | POA: Diagnosis not present

## 2023-02-06 DIAGNOSIS — I10 Essential (primary) hypertension: Secondary | ICD-10-CM | POA: Diagnosis not present

## 2023-02-06 DIAGNOSIS — E1129 Type 2 diabetes mellitus with other diabetic kidney complication: Secondary | ICD-10-CM | POA: Diagnosis not present

## 2023-02-18 DIAGNOSIS — Z008 Encounter for other general examination: Secondary | ICD-10-CM | POA: Diagnosis not present

## 2023-02-18 DIAGNOSIS — E271 Primary adrenocortical insufficiency: Secondary | ICD-10-CM | POA: Diagnosis not present

## 2023-02-18 DIAGNOSIS — C50919 Malignant neoplasm of unspecified site of unspecified female breast: Secondary | ICD-10-CM | POA: Diagnosis not present

## 2023-02-18 DIAGNOSIS — M810 Age-related osteoporosis without current pathological fracture: Secondary | ICD-10-CM | POA: Diagnosis not present

## 2023-02-18 DIAGNOSIS — R269 Unspecified abnormalities of gait and mobility: Secondary | ICD-10-CM | POA: Diagnosis not present

## 2023-02-18 DIAGNOSIS — N189 Chronic kidney disease, unspecified: Secondary | ICD-10-CM | POA: Diagnosis not present

## 2023-02-18 DIAGNOSIS — F329 Major depressive disorder, single episode, unspecified: Secondary | ICD-10-CM | POA: Diagnosis not present

## 2023-02-18 DIAGNOSIS — I129 Hypertensive chronic kidney disease with stage 1 through stage 4 chronic kidney disease, or unspecified chronic kidney disease: Secondary | ICD-10-CM | POA: Diagnosis not present

## 2023-02-18 DIAGNOSIS — M1990 Unspecified osteoarthritis, unspecified site: Secondary | ICD-10-CM | POA: Diagnosis not present

## 2023-02-18 DIAGNOSIS — E039 Hypothyroidism, unspecified: Secondary | ICD-10-CM | POA: Diagnosis not present

## 2023-02-18 DIAGNOSIS — K219 Gastro-esophageal reflux disease without esophagitis: Secondary | ICD-10-CM | POA: Diagnosis not present

## 2023-02-18 DIAGNOSIS — E785 Hyperlipidemia, unspecified: Secondary | ICD-10-CM | POA: Diagnosis not present

## 2023-02-18 DIAGNOSIS — Z9181 History of falling: Secondary | ICD-10-CM | POA: Diagnosis not present

## 2023-03-03 ENCOUNTER — Encounter: Payer: Self-pay | Admitting: Sports Medicine

## 2023-03-07 DIAGNOSIS — H1031 Unspecified acute conjunctivitis, right eye: Secondary | ICD-10-CM | POA: Diagnosis not present

## 2023-04-02 ENCOUNTER — Ambulatory Visit: Payer: Medicare HMO

## 2023-04-03 ENCOUNTER — Encounter: Payer: Self-pay | Admitting: Gastroenterology

## 2023-04-04 ENCOUNTER — Encounter: Attending: Sports Medicine | Admitting: *Deleted

## 2023-04-04 VITALS — BP 110/63 | HR 73 | Temp 98.5°F | Resp 16

## 2023-04-04 DIAGNOSIS — M810 Age-related osteoporosis without current pathological fracture: Secondary | ICD-10-CM | POA: Diagnosis not present

## 2023-04-04 MED ORDER — DENOSUMAB 60 MG/ML ~~LOC~~ SOSY
60.0000 mg | PREFILLED_SYRINGE | Freq: Once | SUBCUTANEOUS | Status: AC
  Administered 2023-04-04: 60 mg via SUBCUTANEOUS

## 2023-04-04 NOTE — Progress Notes (Signed)
 Diagnosis: Osteoporosis  Provider:  Albertha Ghee MD  Procedure: Injection  Prolia (Denosumab), Dose: 60 mg, Site: subcutaneous, Number of injections: 1  Injection Site(s): Left arm  Post Care: Observation period completed  Discharge: Condition: Good, Destination: Home . AVS Provided  Performed by:  Daleen Squibb, RN

## 2023-04-09 ENCOUNTER — Ambulatory Visit: Payer: Medicare HMO | Admitting: Urology

## 2023-04-09 DIAGNOSIS — R350 Frequency of micturition: Secondary | ICD-10-CM

## 2023-04-09 DIAGNOSIS — R3915 Urgency of urination: Secondary | ICD-10-CM

## 2023-04-09 DIAGNOSIS — N3281 Overactive bladder: Secondary | ICD-10-CM

## 2023-04-30 NOTE — Progress Notes (Unsigned)
 GI Office Note    Referring Provider: Artemisa Bile, MD Primary Care Physician:  Artemisa Bile, MD Primary Gastroenterologist: Urban Garden, MD  Date:  05/01/2023  ID:  Breanna Hill, DOB 07/11/1940, MRN [id number]  Chief Complaint   Chief Complaint  Patient presents with   Follow-up    Follow up on constipation, pt states she is better   History of Present Illness  Breanna Hill is a 83 y.o. female with a history of breast cancer, arthritis, depression, GERD, HTN, HLD, hypothyroidism, constipation, and prediabetes presenting today for follow up.   History of constipation and has tried Dulcolax and alternating with other laxatives over-the-counter.  Had noticed some improvement with fiber.  Also stating rectum felt tight and had to self disimpact stools.  Had had some improvement with Linzess in the past however on follow-up had reported it was too strong.   OV April 2024.  Patient reported she excellently put a cleaner in her eyes instead of eyedrops and was having some vision issues but currently using antibiotic cream.  In regards to her constipation she feels the increase water  intake is in the living that helps as long as she stays hydrated she does not have any issues.  Occasionally having liquid stools but not often.  Denies any abdominal pain, melena, or BRBPR.  Continue to take 1-2 Senokot daily and has stopped Metamucil.  Occasionally taking MiraLAX .  Continuing to have a lack of appetite and was actively working on weight loss, had recently lost about 20 pounds..  Occasional GERD but nothing frequent.  Advise continue adequate hydration, continue Senokot, and continue to follow a high-fiber diet.  Last office visit 10/16/22. Denied any abdominal pain or gassiness.  Noted a little constipation since last visit with a couple episodes of needing to strain.  Linzess too strong for her in the past.  Currently taking Benefiber and MiraLAX  and trying to stay adequately  hydrated.  Usually with bowel movements that are difficult to pass she has severe rectal pain.  Takes Dulcolax as well as extra MiraLAX  when she has these troubles.  When the hemorrhoids flare she does have bleeding.  Advised increase fiber to twice daily and continue MiraLAX  once daily and increase to twice daily as needed.  Continue with adequate hydration.  When she is constipated if she takes 3 doses of MiraLAX  and still does not have a bowel movement I advised her that she can take a dose of Linzess.  We also discussed possible use of enema versus suppository if needed.  Possible option of trialing Amitiza versus Trulance if worsening constipation despite use of MiraLAX .   Today:  Still taking morning fiber and miralax  but has not needed second dose in the day.   Reports she has had 2 poop shy episodes while traveling and had to take 4 laxatives (dulcolax). Will take it if she goes more than 24 hours without a BM. Currently going everyday and sometimes twice daily. Does eat more fruit.   Starts out with gas then has fear of forceful BMs and afraid of bleeding. This past time that did not happen. Did not even have to take the Linzess when that happened. No abdominal pain. Has not really had any rectal bleeding, very little with wiping. Sometimes has had a little more in the commode. Usues rectal hydrocortisone when this occurs.   Wt Readings from Last 3 Encounters:  05/01/23 137 lb 6.4 oz (62.3 kg)  12/11/22 134 lb 12.8 oz (61.1  kg)  10/16/22 136 lb 6.4 oz (61.9 kg)    Current Outpatient Medications  Medication Sig Dispense Refill   acetaminophen  (TYLENOL ) 650 MG CR tablet Take 1,300 mg by mouth every 8 (eight) hours as needed for pain.     Apoaequorin (PREVAGEN) 10 MG CAPS Take 10 mg by mouth in the morning.     aspirin  EC 81 MG tablet Take 81 mg by mouth daily. Swallow whole.     atorvastatin  (LIPITOR) 20 MG tablet Take 20 mg by mouth in the morning.     Calcium  Carbonate-Vitamin D  (CALCIUM  600+D PO) Take 1 tablet by mouth at bedtime.     Cholecalciferol (VITAMIN D-3) 125 MCG (5000 UT) TABS Take 5,000 Units by mouth in the morning.     diclofenac Sodium (VOLTAREN ARTHRITIS PAIN) 1 % GEL Apply 2 g topically in the morning. Knee     letrozole  (FEMARA ) 2.5 MG tablet Take 1 tablet (2.5 mg total) by mouth daily. 90 tablet 3   levothyroxine  (SYNTHROID ) 88 MCG tablet Take 88 mcg by mouth daily before breakfast.     mirtazapine  (REMERON ) 15 MG tablet Take 15 mg by mouth at bedtime.     olmesartan (BENICAR) 20 MG tablet Take 20 mg by mouth daily.     senna-docusate (SENOKOT-S) 8.6-50 MG tablet Take 1 tablet by mouth daily.     trospium  (SANCTURA ) 20 MG tablet TAKE 1 TABLET BY MOUTH TWICE A DAY 180 tablet 3   Vibegron  (GEMTESA ) 75 MG TABS Take 1 tablet (75 mg total) by mouth daily. TAKE 1 TABLET BY MOUTH AT 2PM 90 tablet 3   zolpidem  (AMBIEN ) 5 MG tablet Take 2.5 tablets (12.5 mg total) by mouth at bedtime as needed for sleep. (Patient taking differently: Take 10 mg by mouth at bedtime.) 30 tablet 0   No current facility-administered medications for this visit.    Past Medical History:  Diagnosis Date   Ankylosis of left knee    Arthritis    R hand    Breast cancer (HCC)    left   Cancer (HCC)    breast CA- diagnosed with needle biopsy   Depression    High cholesterol 10/17/2015   History of radiation therapy 03/27/17- 04/23/17   Left Breast, 2.67 Gy in 15 fractions for a total dose of 40.05 Gy. Boost, 2 Gy in 5 fractions for a total dose of 10 Gy.    Hypertension    Hypothyroidism    Pre-diabetes    Hgba1C- in the 6 's , per pt., she monitors her diet    Past Surgical History:  Procedure Laterality Date   BACK SURGERY  1973   lower   BREAST BIOPSY Left 02/04/2017   BREAST BIOPSY Right 11/29/2021   MM RT BREAST BX W LOC DEV 1ST LESION IMAGE BX SPEC STEREO GUIDE 11/29/2021 [facility]   BREAST LUMPECTOMY Left 02/2017   BREAST LUMPECTOMY WITH RADIOACTIVE SEED  AND SENTINEL LYMPH NODE BIOPSY Left 02/27/2017   Procedure: LEFT BREAST LUMPECTOMY WITH RADIOACTIVE SEED AND LEFT AXILLARY SENTINEL LYMPH NODE BIOPSY;  Surgeon: [name] Ebbing, MD;  Location: [facility] OR;  Service: General;  Laterality: Left;   COLONOSCOPY     X 2   COLONOSCOPY N/A 02/22/2016   Procedure: COLONOSCOPY;  Surgeon: Ruby Corporal, MD;  Location: [facility];  Service: Endoscopy;  Laterality: N/A;  1:45   KNEE CLOSED REDUCTION Left 06/11/2022   Procedure: CLOSED MANIPULATION KNEE;  Surgeon: Murleen Arms, MD;  Location: [facility];  Service: Orthopedics;  Laterality: Left;   Left shoultder surgery     for bone spurs & frozen shoulder    REVERSE SHOULDER ARTHROPLASTY Left 09/14/2020   Procedure: REVERSE SHOULDER ARTHROPLASTY;  Surgeon: Micheline Ahr, MD;  Location: [facility];  Service: Orthopedics;  Laterality: Left;   Right bunionectomy     TONSILLECTOMY     TOTAL KNEE ARTHROPLASTY Left 04/02/2022   Procedure: TOTAL KNEE ARTHROPLASTY;  Surgeon: Murleen Arms, MD;  Location: [facility];  Service: Orthopedics;  Laterality: Left;   TUBAL LIGATION      Family History  Problem Relation Age of Onset   Colon cancer Neg Hx     Allergies as of 05/01/2023 - Review Complete 05/01/2023  Allergen Reaction Noted   Penicillins Rash     Social History   Socioeconomic History   Marital status: Widowed    Spouse name: Not on file   Number of children: 2   Years of education: 45   Highest education level: 12th grade  Occupational History   Not on file  Tobacco Use   Smoking status: Never    Passive exposure: Never   Smokeless tobacco: Never  Vaping Use   Vaping status: Never Used  Substance and Sexual Activity   Alcohol  use: Yes    Comment: occasional   Drug use: No   Sexual activity: Not Currently    Partners: Male    Birth control/protection: Post-menopausal  Other Topics Concern   Not on file  Social History Narrative   Not on file   Social  Drivers of Health   Financial Resource Strain: Low Risk  (08/10/2021)   Overall Financial Resource Strain (CARDIA)    Difficulty of Paying Living Expenses: Not hard at all  Food Insecurity: No Food Insecurity (04/02/2022)   Hunger Vital Sign    Worried About Running Out of Food in the Last Year: Never true    Ran Out of Food in the Last Year: Never true  Transportation Needs: No Transportation Needs (04/02/2022)   PRAPARE - Administrator, Civil Service (Medical): No    Lack of Transportation (Non-Medical): No  Physical Activity: Inactive (08/10/2021)   Exercise Vital Sign    Days of Exercise per Week: 0 days    Minutes of Exercise per Session: 0 min  Stress: No Stress Concern Present (08/10/2021)   [employer] of Occupational Health - Occupational Stress Questionnaire    Feeling of Stress : Only a little  Social Connections: Moderately Integrated (08/10/2021)   Social Connection and Isolation Panel [NHANES]    Frequency of Communication with Friends and Family: More than three times a week    Frequency of Social Gatherings with Friends and Family: More than three times a week    Attends Religious Services: Never    Database administrator or Organizations: Yes    Attends Engineer, structural: More than 4 times per year    Marital Status: Married   Review of Systems   Gen: Denies fever, chills, anorexia. Denies fatigue, weakness, weight loss.  CV: Denies chest pain, palpitations, syncope, peripheral edema, and claudication. Resp: Denies dyspnea at rest, cough, wheezing, coughing up blood, and pleurisy. GI: See HPI Derm: Denies rash, itching, dry skin Psych: Denies depression, anxiety, memory loss, confusion. No homicidal or suicidal ideation.  Heme: Denies bruising, bleeding, and enlarged lymph nodes.  Physical Exam   BP 102/61   Pulse 76  Temp 98.7 F (37.1 C)   Ht 5\' 2"  (1.575 m)   Wt 137 lb 6.4 oz (62.3 kg)   BMI 25.13 kg/m   General:   Alert and  oriented. No distress noted. Pleasant and cooperative.  Head:  Normocephalic and atraumatic. Eyes:  Conjuctiva clear without scleral icterus. Mouth:  Oral mucosa pink and moist. Good dentition. No lesions. Abdomen: soft, non-tender and non-distended. Rectal: deferred Msk:  Symmetrical without gross deformities. Normal posture. Extremities:  Without edema. Neurologic:  Alert and  oriented x4 Psych:  Alert and cooperative. Normal mood and affect.  Assessment  Breanna Hill is a 83 y.o. female with a history of breast cancer, arthritis, depression, GERD, HTN, HLD, hypothyroidism, constipation, and prediabetes presenting today for follow-up on constipation.  Constipation: Well-controlled with senna/Colace combination once daily as well as 1 tablespoon of fiber and 1 capful of MiraLAX  daily.  Has only had a couple episodes of constipation where she went more than 24 hours without a bowel movement and her symptoms were relieved with stimulant laxatives (dulcolax).  Has not needed any extra doses of MiraLAX  or fiber as previously recommended.  Usually BM 1-2 times daily.  No need to strain, no abdominal pain, nausea, or vomiting.  Will continue current regimen  Hemorrhoids: Continues to have intermittent symptoms with some bleeding, usually no more than a handful of blood and this usually occurs with her significant constipation episodes and mostly has toilet tissue hematochezia with that.  Using hemorrhoid cream as needed which is providing good relief.  PLAN   Continue fiber daily Continue miralax  daily.  Continue senna docusate sodium  combo.  Monitor for worsening rectal bleeding. Hemorrhoid cream as needed.  Follow up in 1 year.     Julian Obey, MSN, FNP-BC, AGACNP-BC [facility]  I have reviewed the note and agree with the APP's assessment as described in this progress note  Samantha Cress, MD Gastroenterology and Hepatology [facility]  Gastroenterology

## 2023-05-01 ENCOUNTER — Ambulatory Visit (INDEPENDENT_AMBULATORY_CARE_PROVIDER_SITE_OTHER): Admitting: Gastroenterology

## 2023-05-01 ENCOUNTER — Encounter: Payer: Self-pay | Admitting: Gastroenterology

## 2023-05-01 VITALS — BP 102/61 | HR 76 | Temp 98.7°F | Ht 62.0 in | Wt 137.4 lb

## 2023-05-01 DIAGNOSIS — K649 Unspecified hemorrhoids: Secondary | ICD-10-CM | POA: Diagnosis not present

## 2023-05-01 DIAGNOSIS — K5900 Constipation, unspecified: Secondary | ICD-10-CM

## 2023-05-01 DIAGNOSIS — K640 First degree hemorrhoids: Secondary | ICD-10-CM

## 2023-05-01 NOTE — Patient Instructions (Signed)
 Continue your current bowel regimen.  Continue hemorrhoid cream as needed!  If you have multiple episodes of rectal bleeding that do not stop with use of your cream, please feel free to call the office!  If you begin to have worsening constipation please let me know so I can give you further recommendations!  It was wonderful to see you again today!  We will follow-up in 1 year, sooner if needed  It was a pleasure to see you today. I want to create trusting relationships with patients. If you receive a survey regarding your visit,  I greatly appreciate you taking time to fill this out on paper or through your MyChart. I value your feedback.  Julian Obey, MSN, FNP-BC, AGACNP-BC [facility]

## 2023-05-06 NOTE — Progress Notes (Signed)
 History of Present Illness: Here for f/u of OAB.  She is currently in the midst of PTNS therapy.  She is on dual medical therapy for OAB.  Past Medical History:  Diagnosis Date   Ankylosis of left knee    Arthritis    R hand    Breast cancer (HCC)    left   Cancer (HCC)    breast CA- diagnosed with needle biopsy   Depression    High cholesterol 10/17/2015   History of radiation therapy 03/27/17- 04/23/17   Left Breast, 2.67 Gy in 15 fractions for a total dose of 40.05 Gy. Boost, 2 Gy in 5 fractions for a total dose of 10 Gy.    Hypertension    Hypothyroidism    Pre-diabetes    Hgba1C- in the 6 's , per pt., she monitors her diet    Past Surgical History:  Procedure Laterality Date   BACK SURGERY  1973   lower   BREAST BIOPSY Left 02/04/2017   BREAST BIOPSY Right 11/29/2021   MM RT BREAST BX W LOC DEV 1ST LESION IMAGE BX SPEC STEREO GUIDE 11/29/2021 [facility]   BREAST LUMPECTOMY Left 02/2017   BREAST LUMPECTOMY WITH RADIOACTIVE SEED AND SENTINEL LYMPH NODE BIOPSY Left 02/27/2017   Procedure: LEFT BREAST LUMPECTOMY WITH RADIOACTIVE SEED AND LEFT AXILLARY SENTINEL LYMPH NODE BIOPSY;  Surgeon: [name] Ebbing, MD;  Location: [facility];  Service: General;  Laterality: Left;   COLONOSCOPY     X 2   COLONOSCOPY N/A 02/22/2016   Procedure: COLONOSCOPY;  Surgeon: Ruby Corporal, MD;  Location: [facility];  Service: Endoscopy;  Laterality: N/A;  1:45   KNEE CLOSED REDUCTION Left 06/11/2022   Procedure: CLOSED MANIPULATION KNEE;  Surgeon: Murleen Arms, MD;  Location: [facility];  Service: Orthopedics;  Laterality: Left;   Left shoultder surgery     for bone spurs & frozen shoulder    REVERSE SHOULDER ARTHROPLASTY Left 09/14/2020   Procedure: REVERSE SHOULDER ARTHROPLASTY;  Surgeon: Micheline Ahr, MD;  Location: [facility];  Service: Orthopedics;  Laterality: Left;   Right bunionectomy     TONSILLECTOMY     TOTAL KNEE ARTHROPLASTY Left 04/02/2022    Procedure: TOTAL KNEE ARTHROPLASTY;  Surgeon: Murleen Arms, MD;  Location: [facility];  Service: Orthopedics;  Laterality: Left;   TUBAL LIGATION      Home Medications:  Allergies as of 05/07/2023       Reactions   Penicillins Rash   Has patient had a PCN reaction causing immediate rash, facial/tongue/throat swelling, SOB or lightheadedness with hypotension: No Has patient had a PCN reaction causing severe rash involving mucus membranes or skin necrosis: No Has patient had a PCN reaction that required hospitalization: No Has patient had a PCN reaction occurring within the last 10 years: No If all of the above answers are "NO", then may proceed with Cephalosporin use.        Medication List        Accurate as of May 06, 2023  4:31 PM. If you have any questions, ask your nurse or doctor.          acetaminophen  650 MG CR tablet Commonly known as: TYLENOL  Take 1,300 mg by mouth every 8 (eight) hours as needed for pain.   aspirin  EC 81 MG tablet Take 81 mg by mouth daily. Swallow whole.   atorvastatin  20 MG tablet Commonly known as: LIPITOR Take 20 mg by mouth in the morning.  CALCIUM  600+D PO Take 1 tablet by mouth at bedtime.   Gemtesa  75 MG Tabs Generic drug: Vibegron  Take 1 tablet (75 mg total) by mouth daily. TAKE 1 TABLET BY MOUTH AT 2PM   letrozole  2.5 MG tablet Commonly known as: FEMARA  Take 1 tablet (2.5 mg total) by mouth daily.   levothyroxine  88 MCG tablet Commonly known as: SYNTHROID  Take 88 mcg by mouth daily before breakfast.   mirtazapine  15 MG tablet Commonly known as: REMERON  Take 15 mg by mouth at bedtime.   olmesartan 20 MG tablet Commonly known as: BENICAR Take 20 mg by mouth daily.   Prevagen 10 MG Caps Generic drug: Apoaequorin Take 10 mg by mouth in the morning.   sennosides-docusate sodium  8.6-50 MG tablet Commonly known as: SENOKOT-S Take 1 tablet by mouth daily.   trospium  20 MG tablet Commonly known as: SANCTURA  TAKE 1  TABLET BY MOUTH TWICE A DAY   Vitamin D-3 125 MCG (5000 UT) Tabs Take 5,000 Units by mouth in the morning.   Voltaren Arthritis Pain 1 % Gel Generic drug: diclofenac Sodium Apply 2 g topically in the morning. Knee   zolpidem  5 MG tablet Commonly known as: AMBIEN  Take 2.5 tablets (12.5 mg total) by mouth at bedtime as needed for sleep. What changed:  how much to take when to take this        Allergies:  Allergies  Allergen Reactions   Penicillins Rash    Has patient had a PCN reaction causing immediate rash, facial/tongue/throat swelling, SOB or lightheadedness with hypotension: No Has patient had a PCN reaction causing severe rash involving mucus membranes or skin necrosis: No Has patient had a PCN reaction that required hospitalization: No Has patient had a PCN reaction occurring within the last 10 years: No If all of the above answers are "NO", then may proceed with Cephalosporin use.     Family History  Problem Relation Age of Onset   Colon cancer Neg Hx     Social History:  reports that she has never smoked. She has never been exposed to tobacco smoke. She has never used smokeless tobacco. She reports current alcohol  use. She reports that she does not use drugs.  ROS: A complete review of systems was performed.  All systems are negative except for pertinent findings as noted.  Physical Exam:  Vital signs in last 24 hours: There were no vitals taken for this visit. Constitutional:  Alert and oriented, No acute distress Cardiovascular: Regular rate  Respiratory: Normal respiratory effort Neurologic: Grossly intact, no focal deficits Psychiatric: Normal mood and affect  I have reviewed prior pt notes  I have reviewed urinalysis results  I have independently reviewed prior imaging  I have reviewed bladder scan   Impression/Assessment:  Overactive bladder--wet, on dual medical therapy with continued symptoms  Plan:

## 2023-05-07 ENCOUNTER — Ambulatory Visit: Admitting: Urology

## 2023-05-07 ENCOUNTER — Encounter: Payer: Self-pay | Admitting: Urology

## 2023-05-07 VITALS — BP 146/70 | HR 73

## 2023-05-07 DIAGNOSIS — N3281 Overactive bladder: Secondary | ICD-10-CM

## 2023-06-07 DIAGNOSIS — Z8481 Family history of carrier of genetic disease: Secondary | ICD-10-CM | POA: Diagnosis not present

## 2023-06-07 DIAGNOSIS — Z1371 Encounter for nonprocreative screening for genetic disease carrier status: Secondary | ICD-10-CM | POA: Diagnosis not present

## 2023-06-07 DIAGNOSIS — Z1589 Genetic susceptibility to other disease: Secondary | ICD-10-CM | POA: Diagnosis not present

## 2023-06-07 DIAGNOSIS — Z822 Family history of deafness and hearing loss: Secondary | ICD-10-CM | POA: Diagnosis not present

## 2023-06-07 DIAGNOSIS — H93012 Transient ischemic deafness, left ear: Secondary | ICD-10-CM | POA: Diagnosis not present

## 2023-06-26 DIAGNOSIS — G4700 Insomnia, unspecified: Secondary | ICD-10-CM | POA: Diagnosis not present

## 2023-06-26 DIAGNOSIS — I10 Essential (primary) hypertension: Secondary | ICD-10-CM | POA: Diagnosis not present

## 2023-06-26 DIAGNOSIS — Z79899 Other long term (current) drug therapy: Secondary | ICD-10-CM | POA: Diagnosis not present

## 2023-06-26 DIAGNOSIS — N1832 Chronic kidney disease, stage 3b: Secondary | ICD-10-CM | POA: Diagnosis not present

## 2023-06-26 DIAGNOSIS — E1129 Type 2 diabetes mellitus with other diabetic kidney complication: Secondary | ICD-10-CM | POA: Diagnosis not present

## 2023-08-15 DIAGNOSIS — H01004 Unspecified blepharitis left upper eyelid: Secondary | ICD-10-CM | POA: Diagnosis not present

## 2023-08-15 DIAGNOSIS — H25813 Combined forms of age-related cataract, bilateral: Secondary | ICD-10-CM | POA: Diagnosis not present

## 2023-08-15 DIAGNOSIS — H01001 Unspecified blepharitis right upper eyelid: Secondary | ICD-10-CM | POA: Diagnosis not present

## 2023-08-15 DIAGNOSIS — H01002 Unspecified blepharitis right lower eyelid: Secondary | ICD-10-CM | POA: Diagnosis not present

## 2023-09-23 DIAGNOSIS — N182 Chronic kidney disease, stage 2 (mild): Secondary | ICD-10-CM | POA: Diagnosis not present

## 2023-09-23 DIAGNOSIS — M810 Age-related osteoporosis without current pathological fracture: Secondary | ICD-10-CM | POA: Diagnosis not present

## 2023-09-26 ENCOUNTER — Other Ambulatory Visit (HOSPITAL_COMMUNITY): Payer: Self-pay | Admitting: Sports Medicine

## 2023-09-27 ENCOUNTER — Telehealth: Payer: Self-pay

## 2023-09-27 DIAGNOSIS — H25811 Combined forms of age-related cataract, right eye: Secondary | ICD-10-CM | POA: Diagnosis not present

## 2023-09-27 NOTE — Telephone Encounter (Signed)
 Auth Submission: APPROVED Site of care: Site of care: [facility] Payer: aetna medicare Medication & CPT/J Code(s) submitted: Prolia  (Denosumab ) [id number] Diagnosis Code:  Route of submission (phone, fax, portal): portal Phone # Fax # Auth type: Buy/Bill PB Units/visits requested: 60mg , q15months x 3doses Reference number: [id number] Approval from: 09/27/23 to 09/26/24   Auth Submission: APPROVED Site of care: Site of care: [facility] Payer: Aetna [place] Medication & CPT/J Code(s) submitted: Prolia  (Denosumab ) [id number] Diagnosis Code:  Route of submission (phone, fax, portal): portal Phone # Fax # Auth type: Buy/Bill PB Units/visits requested: 60mg  q76months x 3doses Reference number: [id number]  Approval from: 09/26/23 to 09/24/24

## 2023-09-30 ENCOUNTER — Encounter (HOSPITAL_COMMUNITY)
Admission: RE | Admit: 2023-09-30 | Discharge: 2023-09-30 | Disposition: A | Discharge status: Home / Self Care | Source: Ambulatory Visit | Attending: Ophthalmology | Admitting: Ophthalmology

## 2023-09-30 NOTE — Pre-Procedure Instructions (Signed)
 Attempted pre-op phone call. Listed home number states call cannot be completed and listed cell phone rings without VM.

## 2023-10-01 NOTE — H&P (Signed)
 Surgical History & Physical  Patient Name: Breanna Hill  DOB: Feb 25, 1940  Surgery: Cataract extraction with intraocular lens implant phacoemulsification; Right Eye Surgeon: Lynwood Hermann MD Surgery Date: 10/04/2023 Pre-Op Date: 08/15/2023  HPI: A 27 Yr. old female patient 1.  The patient complains of difficulty when viewing TV, reading closed caption, news scrolls on TV, which began for an unknown amount of time. Both eyes are affected. The episode is uncertain. The patient describes glare symptoms affecting their eyes/vision. The condition's severity is mild and worsening. This is negatively affecting the patient's quality of life and the patient is unable to function adequately in life with the current level of vision. The patient experiences no flashes, floater, shadow, curtain or veil. -Glare Problem: Sunlight. Gtts: none reported HPI Completed by Dr. Lynwood Hermann  Medical History: Cataracts  Arthritis Heart Problem High Blood Pressure LDL Thyroid  Problems hx breast cancer  Review of Systems Endocrine Hypothyroidism Musculoskeletal Joint Ache Psychiatry Depression, Sleep Patterns All recorded systems are negative except as noted above.  Social Never smoked  Medication Prednisolone-moxiflox-bromfen,  Atorvastatin , Levothyroxine , Trospium , Letrozole , Olmesartan, Mirtazapine , Zolpidem , Aspirin   Sx/Procedures Blepharoplasty  Drug Allergies  Penicillin  History & Physical: Heent: cataracts NECK: supple without bruits LUNGS: lungs clear to auscultation CV: regular rate and rhythm Abdomen: soft and non-tender  Impression & Plan: Assessment: 1.  COMBINED FORMS AGE RELATED CATARACT; Both Eyes (H25.813) 2.  BLEPHARITIS; Right Upper Lid, Right Lower Lid, Left Upper Lid, Left Lower Lid (H01.001, H01.002,H01.004,H01.005) 3.  CONJUNCTIVOCHALASIS; Both Eyes (H11.823) 4.  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  Cataract accounts for the patient's decreased vision.  This visual impairment is not correctable with a tolerable change in glasses or contact lenses. Cataract surgery with an implantation of a new lens should significantly improve the visual and functional status of the patient. Recommend phacoemulsification with intraocular lens. Discussed all risks, benefits, alternatives, and potential complications. Discussed the procedures and recovery. The patient desires to have surgery. A-scan/Biometry ordered and will be performed for intraocular lens calculations. The surgery will be performed in order to improve vision for driving, reading, and for eye examinations. Recommend Dextenza  for post-operative pain and inflammation. Educational materials provided: Cataract. History of corneal refractive Surgery: None History of Previous Ocular Surgery (PPV, other): None History of ocular trauma: None Use of Eye Pressure Lowering Drops: None Pupil Status: Dilates poorly - shugarcaine or Lidocaine +Omidira by protocol, Malyugin Ring Right eye first. Refractive Goal: [place]  2.  Blepharitis is present - recommend regular lid cleaning.  3.  Preservative Free Artificial tears 1 drop 2-3x/day.  4.  Findings, prognosis and treatment options reviewed. OCT macula today. May limit final vision after cataract surgery. Will refer to retina if needed.

## 2023-10-04 ENCOUNTER — Encounter (HOSPITAL_COMMUNITY)
Admission: RE | Disposition: A | Discharge status: Home / Self Care | Payer: Self-pay | Source: Home / Self Care | Attending: Ophthalmology

## 2023-10-04 ENCOUNTER — Ambulatory Visit (HOSPITAL_COMMUNITY)
Admission: RE | Admit: 2023-10-04 | Discharge: 2023-10-04 | Disposition: A | Discharge status: Home / Self Care | Attending: Ophthalmology | Admitting: Ophthalmology

## 2023-10-04 ENCOUNTER — Encounter (HOSPITAL_COMMUNITY): Payer: Self-pay | Admitting: Ophthalmology

## 2023-10-04 ENCOUNTER — Ambulatory Visit (HOSPITAL_BASED_OUTPATIENT_CLINIC_OR_DEPARTMENT_OTHER): Admitting: Anesthesiology

## 2023-10-04 ENCOUNTER — Ambulatory Visit (HOSPITAL_COMMUNITY): Admitting: Anesthesiology

## 2023-10-04 ENCOUNTER — Other Ambulatory Visit: Payer: Self-pay

## 2023-10-04 DIAGNOSIS — K219 Gastro-esophageal reflux disease without esophagitis: Secondary | ICD-10-CM | POA: Diagnosis not present

## 2023-10-04 DIAGNOSIS — I10 Essential (primary) hypertension: Secondary | ICD-10-CM | POA: Insufficient documentation

## 2023-10-04 DIAGNOSIS — H25811 Combined forms of age-related cataract, right eye: Secondary | ICD-10-CM

## 2023-10-04 DIAGNOSIS — E039 Hypothyroidism, unspecified: Secondary | ICD-10-CM | POA: Insufficient documentation

## 2023-10-04 DIAGNOSIS — Z79899 Other long term (current) drug therapy: Secondary | ICD-10-CM | POA: Diagnosis not present

## 2023-10-04 HISTORY — PX: CATARACT EXTRACTION W/PHACO: SHX586

## 2023-10-04 SURGERY — PHACOEMULSIFICATION, CATARACT, WITH IOL INSERTION
Anesthesia: Monitor Anesthesia Care | Site: Eye | Laterality: Right

## 2023-10-04 MED ORDER — POVIDONE-IODINE 5 % OP SOLN
OPHTHALMIC | Status: DC | PRN
  Administered 2023-10-04: 1 via OPHTHALMIC

## 2023-10-04 MED ORDER — PHENYLEPHRINE HCL 2.5 % OP SOLN
1.0000 [drp] | OPHTHALMIC | Status: AC | PRN
  Administered 2023-10-04 (×3): 1 [drp] via OPHTHALMIC

## 2023-10-04 MED ORDER — BSS IO SOLN
INTRAOCULAR | Status: DC | PRN
  Administered 2023-10-04: 15 mL via INTRAOCULAR

## 2023-10-04 MED ORDER — TROPICAMIDE 1 % OP SOLN
1.0000 [drp] | OPHTHALMIC | Status: AC | PRN
  Administered 2023-10-04 (×3): 1 [drp] via OPHTHALMIC

## 2023-10-04 MED ORDER — MIDAZOLAM HCL 2 MG/2ML IJ SOLN
INTRAMUSCULAR | Status: AC
  Filled 2023-10-04: qty 2

## 2023-10-04 MED ORDER — SODIUM HYALURONATE 23MG/ML IO SOSY
PREFILLED_SYRINGE | INTRAOCULAR | Status: DC | PRN
  Administered 2023-10-04: .6 mL via INTRAOCULAR

## 2023-10-04 MED ORDER — SODIUM HYALURONATE 10 MG/ML IO SOLUTION
PREFILLED_SYRINGE | INTRAOCULAR | Status: DC | PRN
  Administered 2023-10-04: .85 mL via INTRAOCULAR

## 2023-10-04 MED ORDER — MOXIFLOXACIN HCL 5 MG/ML IO SOLN
INTRAOCULAR | Status: DC | PRN
Start: 2023-10-04 — End: 2023-10-04
  Administered 2023-10-04: .2 mL via INTRACAMERAL

## 2023-10-04 MED ORDER — TETRACAINE HCL 0.5 % OP SOLN
1.0000 [drp] | OPHTHALMIC | Status: AC | PRN
  Administered 2023-10-04 (×3): 1 [drp] via OPHTHALMIC

## 2023-10-04 MED ORDER — LIDOCAINE HCL (PF) 1 % IJ SOLN
INTRAMUSCULAR | Status: DC | PRN
  Administered 2023-10-04: 1 mL

## 2023-10-04 MED ORDER — STERILE WATER FOR IRRIGATION IR SOLN
Status: DC | PRN
  Administered 2023-10-04: 1

## 2023-10-04 MED ORDER — LIDOCAINE HCL 3.5 % OP GEL
1.0000 | Freq: Once | OPHTHALMIC | Status: AC
  Administered 2023-10-04: 1 via OPHTHALMIC

## 2023-10-04 MED ORDER — PHENYLEPHRINE-KETOROLAC 1-0.3 % IO SOLN
INTRAOCULAR | Status: DC | PRN
Start: 2023-10-04 — End: 2023-10-04
  Administered 2023-10-04: 500 mL via OPHTHALMIC

## 2023-10-04 SURGICAL SUPPLY — 11 items
CLOTH BEACON ORANGE TIMEOUT ST (SAFETY) ×1 IMPLANT
EYE SHIELD UNIVERSAL CLEAR (GAUZE/BANDAGES/DRESSINGS) IMPLANT
FEE CATARACT SUITE SIGHTPATH (MISCELLANEOUS) ×1 IMPLANT
GLOVE BIOGEL PI IND STRL 7.0 (GLOVE) ×2 IMPLANT
LENS IOL TECNIS EYHANCE 22.0 (Intraocular Lens) IMPLANT
NDL HYPO 18GX1.5 BLUNT FILL (NEEDLE) ×1 IMPLANT
NEEDLE HYPO 18GX1.5 BLUNT FILL (NEEDLE) ×1 IMPLANT
PAD ARMBOARD POSITIONER FOAM (MISCELLANEOUS) ×1 IMPLANT
SYR TB 1ML LL NO SAFETY (SYRINGE) ×1 IMPLANT
TAPE SURG TRANSPORE 1 IN (GAUZE/BANDAGES/DRESSINGS) IMPLANT
WATER STERILE IRR 250ML POUR (IV SOLUTION) ×1 IMPLANT

## 2023-10-04 NOTE — Op Note (Signed)
 Date of procedure: 10/04/23  Pre-operative diagnosis:  Visually significant combined form age-related cataract, Right Eye (H25.811)  Post-operative diagnosis:  Visually significant combined form age-related cataract, Right Eye (H25.811)  Procedure: Removal of cataract via phacoemulsification and insertion of intra-ocular lens [employer] DIB00 +22.0D into the capsular bag of the Right Eye  Attending surgeon: Lynwood LABOR. Teresa Nicodemus, MD, MA  Anesthesia: MAC, Topical Akten  Complications: None  Estimated Blood Loss: <25mL (minimal)  Specimens: None  Implants: As above  Indications:  Visually significant age-related cataract, Right Eye  Procedure:  The patient was seen and identified in the pre-operative area. The operative eye was identified and dilated.  The operative eye was marked.  Topical anesthesia was administered to the operative eye.     The patient was then to the operative suite and placed in the supine position.  A timeout was performed confirming the patient, procedure to be performed, and all other relevant information.   The patient's face was prepped and draped in the usual fashion for intra-ocular surgery.  A lid speculum was placed into the operative eye and the surgical microscope moved into place and focused.  A superotemporal paracentesis was created using a 20 gauge paracentesis blade. Omidria  was injected into the anterior chamber. Shugarcaine was injected into the anterior chamber.  Viscoelastic was injected into the anterior chamber.  A temporal clear-corneal main wound incision was created using a 2.3mm microkeratome.  A continuous curvilinear capsulorrhexis was initiated using an irrigating cystitome and completed using capsulorrhexis forceps.  Hydrodissection and hydrodeliniation were performed.  Viscoelastic was injected into the anterior chamber.  A phacoemulsification handpiece and a chopper as a second instrument were used to remove the nucleus and epinucleus.  The irrigation/aspiration handpiece was used to remove any remaining cortical material.   The capsular bag was reinflated with viscoelastic, checked, and found to be intact.  The intraocular lens was inserted into the capsular bag.  The irrigation/aspiration handpiece was used to remove any remaining viscoelastic.  The clear corneal wound and paracentesis wounds were then hydrated and checked with Weck-Cels to be watertight. 0.1mL of Moxfloxacin was injected into the anterior chamber. The lid-speculum was removed.  The drape was removed.  The patient's face was cleaned with a wet and dry 4x4. A clear shield was taped over the eye. The patient was taken to the post-operative care unit in good condition, having tolerated the procedure well.  Post-Op Instructions: The patient will follow up at [facility] for a same day post-operative evaluation and will receive all other orders and instructions.

## 2023-10-04 NOTE — Anesthesia Procedure Notes (Signed)
 Date/Time: 10/04/2023 9:03 AM  Performed by: Barbarann Verneita RAMAN, CRNAPre-anesthesia Checklist: Patient identified, Emergency Drugs available, Suction available, Timeout performed and Patient being monitored Patient Re-evaluated:Patient Re-evaluated prior to induction Oxygen Delivery Method: Nasal Cannula

## 2023-10-04 NOTE — Transfer of Care (Signed)
 Immediate Anesthesia Transfer of Care Note  Patient: Breanna Hill  Procedure(s) Performed: PHACOEMULSIFICATION, CATARACT, WITH IOL INSERTION (Right: Eye)  Patient Location: Short Stay  Anesthesia Type:MAC  Level of Consciousness: awake and alert   Airway & Oxygen Therapy: Patient Spontanous Breathing  Post-op Assessment: Report given to RN and Post -op Vital signs reviewed and stable  Post vital signs: Reviewed and stable  Last Vitals:  Vitals Value Taken Time  BP 153/68 10/04/23 09:22  Temp 36.7 C 10/04/23 09:22  Pulse 57 10/04/23 09:22  Resp 18 10/04/23 09:22  SpO2 100 % 10/04/23 09:22    Last Pain:  Vitals:   10/04/23 0922  TempSrc: Oral  PainSc: 0-No pain      Patients Stated Pain Goal: 7 (10/04/23 0922)  Complications: No notable events documented.

## 2023-10-04 NOTE — Anesthesia Postprocedure Evaluation (Signed)
 Anesthesia Post Note  Patient: Breanna Hill  Procedure(s) Performed: PHACOEMULSIFICATION, CATARACT, WITH IOL INSERTION (Right: Eye)  Patient location during evaluation: PACU Anesthesia Type: MAC Level of consciousness: awake and alert Pain management: pain level controlled Vital Signs Assessment: post-procedure vital signs reviewed and stable Respiratory status: spontaneous breathing, nonlabored ventilation, respiratory function stable and patient connected to nasal cannula oxygen Cardiovascular status: stable and blood pressure returned to baseline Postop Assessment: no apparent nausea or vomiting Anesthetic complications: no   There were no known notable events for this encounter.   Last Vitals:  Vitals:   10/04/23 0809 10/04/23 0922  BP: 118/61 (!) 153/68  Pulse: (!) 52 (!) 57  Resp: 18 18  Temp: 36.8 C 36.7 C  SpO2: 99% 100%    Last Pain:  Vitals:   10/04/23 0922  TempSrc: Oral  PainSc: 0-No pain                 Makalynn Berwanger L Aydan Levitz

## 2023-10-04 NOTE — Discharge Instructions (Addendum)
 Please discharge patient when stable, will follow up today with Dr. June Leap at the [facility] office immediately following discharge.  Leave shield in place until visit.  All paperwork with discharge instructions will be given at the office.  [facility] Address:  [address]  [place], [place] [address]

## 2023-10-04 NOTE — Interval H&P Note (Signed)
 History and Physical Interval Note:  10/04/2023 8:58 AM  Breanna Hill  has presented today for surgery, with the diagnosis of combined forms age related cataract, right eye.  The various methods of treatment have been discussed with the patient and family. After consideration of risks, benefits and other options for treatment, the patient has consented to  Procedure(s) with comments: PHACOEMULSIFICATION, CATARACT, WITH IOL INSERTION (Right) - CDE: as a surgical intervention.  The patient's history has been reviewed, patient examined, no change in status, stable for surgery.  I have reviewed the patient's chart and labs.  Questions were answered to the patient's satisfaction.     HARRIE AGENT

## 2023-10-04 NOTE — Anesthesia Preprocedure Evaluation (Signed)
 Anesthesia Evaluation  Patient identified by MRN, date of birth, ID band Patient awake    Reviewed: Allergy & Precautions, NPO status , Patient's Chart, lab work & pertinent test results  Airway Mallampati: II  TM Distance: >3 FB Neck ROM: Full    Dental no notable dental hx. (+) Teeth Intact, Dental Advisory Given   Pulmonary neg pulmonary ROS   Pulmonary exam normal breath sounds clear to auscultation       Cardiovascular hypertension, Pt. on medications Normal cardiovascular exam Rhythm:Regular Rate:Normal     Neuro/Psych  PSYCHIATRIC DISORDERS  Depression    negative neurological ROS     GI/Hepatic Neg liver ROS,GERD  ,,  Endo/Other  Hypothyroidism    Renal/GU negative Renal ROS     Musculoskeletal  (+) Arthritis ,    Abdominal   Peds  Hematology   Anesthesia Other Findings All: PCN  L breast CA  Reproductive/Obstetrics                              Anesthesia Physical Anesthesia Plan  ASA: 3  Anesthesia Plan: MAC   Post-op Pain Management: Minimal or no pain anticipated   Induction:   PONV Risk Score and Plan:   Airway Management Planned: Nasal Cannula and Natural Airway  Additional Equipment: None  Intra-op Plan:   Post-operative Plan:   Informed Consent: I have reviewed the patients History and Physical, chart, labs and discussed the procedure including the risks, benefits and alternatives for the proposed anesthesia with the patient or authorized representative who has indicated his/her understanding and acceptance.     Dental advisory given  Plan Discussed with: CRNA  Anesthesia Plan Comments:         Anesthesia Quick Evaluation

## 2023-10-08 ENCOUNTER — Encounter: Attending: Sports Medicine | Admitting: Emergency Medicine

## 2023-10-08 VITALS — BP 137/75 | HR 70 | Temp 97.8°F | Resp 16

## 2023-10-08 DIAGNOSIS — M810 Age-related osteoporosis without current pathological fracture: Secondary | ICD-10-CM

## 2023-10-08 MED ORDER — DENOSUMAB 60 MG/ML ~~LOC~~ SOSY
60.0000 mg | PREFILLED_SYRINGE | Freq: Once | SUBCUTANEOUS | Status: AC
  Administered 2023-10-08: 60 mg via SUBCUTANEOUS

## 2023-10-08 NOTE — Progress Notes (Signed)
 Diagnosis: Osteoporosis  Provider:  Orpha Stabs MD  Procedure: Injection  Prolia  (Denosumab ), Dose: 60 mg, Site: subcutaneous, Number of injections: 1  Injection Site(s): Right arm  Post Care: Patient declined observation  Discharge: Condition: Good, Destination: Home . AVS Provided  Performed by:  Delon ONEIDA Officer, RN

## 2023-10-10 DIAGNOSIS — H25812 Combined forms of age-related cataract, left eye: Secondary | ICD-10-CM | POA: Diagnosis not present

## 2023-10-14 NOTE — H&P (Signed)
 Surgical History & Physical  Patient Name: Breanna Hill  DOB: 1940/07/31  Surgery: Cataract extraction with intraocular lens implant phacoemulsification; Left Eye Surgeon: Lynwood Hermann MD Surgery Date: 10/18/2023 Pre-Op Date: 10/10/2023  HPI: A 42 Yr. old female patient 1. The patient is returning for a cataract follow-up of the right eye. Since the last visit, the affected area is doing well. The patient's vision is improved. The condition's severity is constant. Patient is following medication instructions. Pt. is ready to proceed with cataract surgery on OS due to blurred vision which is negatively affecting the patient's quality of life and the patient is unable to function adequately in life with the current level of vision. Hazy/blurry VA, poor night VA, hard to see near bottles/labels/forms, seeing captions on TV, glare day and night, doesn't drive anymore. This is negatively affecting the patient's quality of life and the patient is unable to function adequately in life with the current level of vision.  Medical History: Cataracts PCIOL OD  Arthritis Heart Problem High Blood Pressure LDL Thyroid  Problems had breast cancer  Review of Systems Endocrine Hypothyroidism Eyes Vision loss Musculoskeletal Joint Ache Psychiatry Depression, Sleep Patterns All recorded systems are negative except as noted above.  Social Never smoked  Medication Prednisolone-moxiflox-bromfen,  Atorvastatin , Levothyroxine , Trospium , Letrozole , Olmesartan, Mirtazapine , Zolpidem , Aspirin   Sx/Procedures Blepharoplasty, Phaco c IOL OD  Drug Allergies  penicillin   History & Physical: Heent: cataract NECK: supple without bruits LUNGS: lungs clear to auscultation CV: regular rate and rhythm Abdomen: soft and non-tender  Impression & Plan: Assessment: 1.  CATARACT EXTRACTION STATUS; Right Eye (Z98.41) 2.  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  NUCLEAR SCLEROSIS AGE  RELATED; Left Eye (H25.12)  Plan: 1.  1 week after cataract surgery. Doing well with improved vision and normal eye pressure. Call with any problems or concerns. Continue Pred-Moxi-Brom 2x/day for 3 more weeks.  2.  Cataract accounts for the patient's decreased vision. This visual impairment is not correctable with a tolerable change in glasses or contact lenses. Cataract surgery with an implantation of a new lens should significantly improve the visual and functional status of the patient. Recommend phacoemulsification with intraocular lens. Discussed all risks, benefits, alternatives, and potential complications. Discussed the procedures and recovery. The patient desires to have surgery. A-scan/Biometry ordered and will be performed for intraocular lens calculations. The surgery will be performed in order to improve vision for driving, reading, and for eye examinations. Recommend Dextenza  for post-operative pain and inflammation. Educational materials provided: Cataract. History of corneal refractive Surgery: None History of Previous Ocular Surgery (PPV, other): None History of ocular trauma: None Use of Eye Pressure Lowering Drops: None Pupil Status: Dilates poorly - shugarcaine or Lidocaine +Omidira by protocol, Malyugin Ring Left Eye Refractive Goal: [place]  3. See above

## 2023-10-16 ENCOUNTER — Encounter (HOSPITAL_COMMUNITY)
Admission: RE | Admit: 2023-10-16 | Discharge: 2023-10-16 | Disposition: A | Discharge status: Home / Self Care | Source: Ambulatory Visit | Attending: Ophthalmology | Admitting: Ophthalmology

## 2023-10-16 ENCOUNTER — Encounter (HOSPITAL_COMMUNITY): Payer: Self-pay

## 2023-10-18 ENCOUNTER — Encounter (HOSPITAL_COMMUNITY)
Admission: RE | Disposition: A | Discharge status: Home / Self Care | Payer: Self-pay | Source: Home / Self Care | Attending: Ophthalmology

## 2023-10-18 ENCOUNTER — Ambulatory Visit (HOSPITAL_COMMUNITY)
Admission: RE | Admit: 2023-10-18 | Discharge: 2023-10-18 | Disposition: A | Discharge status: Home / Self Care | Attending: Ophthalmology | Admitting: Ophthalmology

## 2023-10-18 ENCOUNTER — Ambulatory Visit (HOSPITAL_COMMUNITY): Admitting: Anesthesiology

## 2023-10-18 ENCOUNTER — Other Ambulatory Visit: Payer: Self-pay

## 2023-10-18 ENCOUNTER — Encounter (HOSPITAL_COMMUNITY): Payer: Self-pay | Admitting: Ophthalmology

## 2023-10-18 DIAGNOSIS — E039 Hypothyroidism, unspecified: Secondary | ICD-10-CM | POA: Diagnosis not present

## 2023-10-18 DIAGNOSIS — I10 Essential (primary) hypertension: Secondary | ICD-10-CM | POA: Diagnosis not present

## 2023-10-18 DIAGNOSIS — H25812 Combined forms of age-related cataract, left eye: Secondary | ICD-10-CM

## 2023-10-18 DIAGNOSIS — Z9841 Cataract extraction status, right eye: Secondary | ICD-10-CM | POA: Insufficient documentation

## 2023-10-18 HISTORY — PX: CATARACT EXTRACTION W/PHACO: SHX586

## 2023-10-18 LAB — GLUCOSE, CAPILLARY: Glucose-Capillary: 89 mg/dL (ref 70–99)

## 2023-10-18 SURGERY — PHACOEMULSIFICATION, CATARACT, WITH IOL INSERTION
Anesthesia: Monitor Anesthesia Care | Site: Eye | Laterality: Left

## 2023-10-18 MED ORDER — LACTATED RINGERS IV SOLN: INTRAVENOUS | Status: DC

## 2023-10-18 MED ORDER — TETRACAINE HCL 0.5 % OP SOLN
1.0000 [drp] | OPHTHALMIC | Status: AC | PRN
  Administered 2023-10-18 (×3): 1 [drp] via OPHTHALMIC

## 2023-10-18 MED ORDER — STERILE WATER FOR IRRIGATION IR SOLN
Status: DC | PRN
  Administered 2023-10-18: 1

## 2023-10-18 MED ORDER — SODIUM HYALURONATE 10 MG/ML IO SOLUTION
PREFILLED_SYRINGE | INTRAOCULAR | Status: DC | PRN
  Administered 2023-10-18: .85 mL via INTRAOCULAR

## 2023-10-18 MED ORDER — SODIUM HYALURONATE 23MG/ML IO SOSY
PREFILLED_SYRINGE | INTRAOCULAR | Status: DC | PRN
  Administered 2023-10-18: .6 mL via INTRAOCULAR

## 2023-10-18 MED ORDER — BSS IO SOLN
INTRAOCULAR | Status: DC | PRN
  Administered 2023-10-18: 15 mL via INTRAOCULAR

## 2023-10-18 MED ORDER — PHENYLEPHRINE HCL 2.5 % OP SOLN
1.0000 [drp] | OPHTHALMIC | Status: AC | PRN
  Administered 2023-10-18 (×3): 1 [drp] via OPHTHALMIC

## 2023-10-18 MED ORDER — POVIDONE-IODINE 5 % OP SOLN
OPHTHALMIC | Status: DC | PRN
  Administered 2023-10-18: 1 via OPHTHALMIC

## 2023-10-18 MED ORDER — PHENYLEPHRINE-KETOROLAC 1-0.3 % IO SOLN
INTRAOCULAR | Status: DC | PRN
  Administered 2023-10-18: 500 mL via OPHTHALMIC

## 2023-10-18 MED ORDER — MOXIFLOXACIN HCL 5 MG/ML IO SOLN
INTRAOCULAR | Status: DC | PRN
  Administered 2023-10-18: .3 mL via INTRACAMERAL

## 2023-10-18 MED ORDER — TROPICAMIDE 1 % OP SOLN
1.0000 [drp] | OPHTHALMIC | Status: AC | PRN
  Administered 2023-10-18 (×3): 1 [drp] via OPHTHALMIC

## 2023-10-18 MED ORDER — LIDOCAINE HCL 3.5 % OP GEL
1.0000 | Freq: Once | OPHTHALMIC | Status: AC
  Administered 2023-10-18: 1 via OPHTHALMIC

## 2023-10-18 MED ADMIN — Lidocaine HCl Local Preservative Free (PF) Inj 1%: 1 mL | NDC 00409471332

## 2023-10-18 SURGICAL SUPPLY — 12 items
CLOTH BEACON ORANGE TIMEOUT ST (SAFETY) ×1 IMPLANT
EYE SHIELD UNIVERSAL CLEAR (GAUZE/BANDAGES/DRESSINGS) IMPLANT
FEE CATARACT SUITE SIGHTPATH (MISCELLANEOUS) ×1 IMPLANT
GLOVE BIOGEL PI IND STRL 7.0 (GLOVE) ×2 IMPLANT
KIT SLEEVE INFUSION .9 MICRO (MISCELLANEOUS) IMPLANT
LENS IOL TECNIS EYHANCE 23.5 (Intraocular Lens) IMPLANT
NDL HYPO 18GX1.5 BLUNT FILL (NEEDLE) ×1 IMPLANT
NEEDLE HYPO 18GX1.5 BLUNT FILL (NEEDLE) ×1 IMPLANT
PAD ARMBOARD POSITIONER FOAM (MISCELLANEOUS) ×1 IMPLANT
SYR TB 1ML LL NO SAFETY (SYRINGE) ×1 IMPLANT
TAPE SURG TRANSPORE 1 IN (GAUZE/BANDAGES/DRESSINGS) IMPLANT
WATER STERILE IRR 250ML POUR (IV SOLUTION) ×1 IMPLANT

## 2023-10-18 NOTE — Anesthesia Postprocedure Evaluation (Signed)
 Anesthesia Post Note  Patient: Deeanne Deininger Keleher  Procedure(s) Performed: PHACOEMULSIFICATION, CATARACT, WITH IOL INSERTION (Left: Eye)  Patient location during evaluation: Short Stay Anesthesia Type: MAC Level of consciousness: awake and alert Pain management: pain level controlled Vital Signs Assessment: post-procedure vital signs reviewed and stable Respiratory status: spontaneous breathing Cardiovascular status: blood pressure returned to baseline and stable Postop Assessment: no apparent nausea or vomiting Anesthetic complications: no   No notable events documented.   Last Vitals:  Vitals:   10/18/23 0958 10/18/23 1119  BP: (!) 149/83 (!) 157/72  Pulse: (!) 57 (!) 55  Resp: 14 14  Temp: 36.7 C 36.6 C  SpO2: 100% 100%    Last Pain:  Vitals:   10/18/23 1119  TempSrc: Oral  PainSc: 0-No pain                 Jasreet Dickie

## 2023-10-18 NOTE — Anesthesia Preprocedure Evaluation (Signed)
 Anesthesia Evaluation  Patient identified by MRN, date of birth, ID band Patient awake    Reviewed: Allergy & Precautions, NPO status , Patient's Chart, lab work & pertinent test results  Airway Mallampati: II  TM Distance: >3 FB Neck ROM: Full    Dental no notable dental hx. (+) Teeth Intact, Dental Advisory Given   Pulmonary neg pulmonary ROS   Pulmonary exam normal breath sounds clear to auscultation       Cardiovascular hypertension, Pt. on medications Normal cardiovascular exam Rhythm:Regular Rate:Normal     Neuro/Psych  PSYCHIATRIC DISORDERS  Depression    negative neurological ROS     GI/Hepatic Neg liver ROS,GERD  ,,  Endo/Other  Hypothyroidism    Renal/GU negative Renal ROS     Musculoskeletal  (+) Arthritis ,    Abdominal   Peds  Hematology   Anesthesia Other Findings All: PCN  L breast CA  Reproductive/Obstetrics                              Anesthesia Physical Anesthesia Plan  ASA: 3  Anesthesia Plan: MAC   Post-op Pain Management: Minimal or no pain anticipated   Induction:   PONV Risk Score and Plan:   Airway Management Planned: Nasal Cannula and Natural Airway  Additional Equipment: None  Intra-op Plan:   Post-operative Plan:   Informed Consent: I have reviewed the patients History and Physical, chart, labs and discussed the procedure including the risks, benefits and alternatives for the proposed anesthesia with the patient or authorized representative who has indicated his/her understanding and acceptance.     Dental advisory given  Plan Discussed with: CRNA  Anesthesia Plan Comments:         Anesthesia Quick Evaluation

## 2023-10-18 NOTE — Transfer of Care (Signed)
 Immediate Anesthesia Transfer of Care Note  Patient: Breanna Hill  Procedure(s) Performed: PHACOEMULSIFICATION, CATARACT, WITH IOL INSERTION (Left: Eye)  Patient Location: Short Stay  Anesthesia Type:MAC  Level of Consciousness: awake  Airway & Oxygen Therapy: Patient Spontanous Breathing  Post-op Assessment: Report given to RN  Post vital signs: Reviewed  Last Vitals:  Vitals Value Taken Time  BP    Temp    Pulse    Resp    SpO2      Last Pain:  Vitals:   10/18/23 0958  TempSrc: Oral  PainSc: 0-No pain         Complications: No notable events documented.

## 2023-10-18 NOTE — Interval H&P Note (Signed)
 History and Physical Interval Note:  10/18/2023 10:49 AM  Breanna Hill  has presented today for surgery, with the diagnosis of combined forms age related cataract, left eye.  The various methods of treatment have been discussed with the patient and family. After consideration of risks, benefits and other options for treatment, the patient has consented to  Procedure(s): PHACOEMULSIFICATION, CATARACT, WITH IOL INSERTION (Left) as a surgical intervention.  The patient's history has been reviewed, patient examined, no change in status, stable for surgery.  I have reviewed the patient's chart and labs.  Questions were answered to the patient's satisfaction.     HARRIE AGENT

## 2023-10-18 NOTE — Discharge Instructions (Addendum)
 Please discharge patient when stable, will follow up today with Dr. June Leap at the [facility] office immediately following discharge.  Leave shield in place until visit.  All paperwork with discharge instructions will be given at the office.  [facility] Address:  [address]  [place], [place] [address]

## 2023-10-18 NOTE — Op Note (Signed)
 Date of procedure: 10/18/23  Pre-operative diagnosis: Visually significant age-related combined cataract, Left Eye (H25.812)  Post-operative diagnosis: Visually significant age-related combined cataract, Left Eye (H25.812)  Procedure: Removal of cataract via phacoemulsification and insertion of intra-ocular lens Vicci and Johnson DIB00 +23.5D into the capsular bag of the Left Eye  Attending surgeon: Lynwood LABOR. Mellody Masri, MD, MA  Anesthesia: MAC, Topical Akten  Complications: None  Estimated Blood Loss: <34mL (minimal)  Specimens: None  Implants: As above  Indications:  Visually significant age-related cataract, Left Eye  Procedure:  The patient was seen and identified in the pre-operative area. The operative eye was identified and dilated.  The operative eye was marked.  Topical anesthesia was administered to the operative eye.     The patient was then to the operative suite and placed in the supine position.  A timeout was performed confirming the patient, procedure to be performed, and all other relevant information.   The patient's face was prepped and draped in the usual fashion for intra-ocular surgery.  A lid speculum was placed into the operative eye and the surgical microscope moved into place and focused.  An inferotemporal paracentesis was created using a 20 gauge paracentesis blade. Omidria was injected into the anterior chamber. Shugarcaine was injected into the anterior chamber.  Viscoelastic was injected into the anterior chamber.  A temporal clear-corneal main wound incision was created using a 2.51mm microkeratome.  A continuous curvilinear capsulorrhexis was initiated using an irrigating cystitome and completed using capsulorrhexis forceps.  Hydrodissection and hydrodeliniation were performed.  Viscoelastic was injected into the anterior chamber.  A phacoemulsification handpiece and a chopper as a second instrument were used to remove the nucleus and epinucleus. The  irrigation/aspiration handpiece was used to remove any remaining cortical material.   The capsular bag was reinflated with viscoelastic, checked, and found to be intact.  The intraocular lens was inserted into the capsular bag.  The irrigation/aspiration handpiece was used to remove any remaining viscoelastic.  The clear corneal wound and paracentesis wounds were then hydrated and checked with Weck-Cels to be watertight. 0.1mL of Moxfloxacin was injected into the anterior chamber. The lid-speculum was removed.  The drape was removed.  The patient's face was cleaned with a wet and dry 4x4.    A clear shield was taped over the eye. The patient was taken to the post-operative care unit in good condition, having tolerated the procedure well.  Post-Op Instructions: The patient will follow up at [facility] for a same day post-operative evaluation and will receive all other orders and instructions.

## 2023-10-21 ENCOUNTER — Other Ambulatory Visit: Payer: Self-pay | Admitting: Internal Medicine

## 2023-10-21 DIAGNOSIS — Z1231 Encounter for screening mammogram for malignant neoplasm of breast: Secondary | ICD-10-CM

## 2023-11-04 DIAGNOSIS — G4700 Insomnia, unspecified: Secondary | ICD-10-CM | POA: Diagnosis not present

## 2023-11-04 DIAGNOSIS — N1832 Chronic kidney disease, stage 3b: Secondary | ICD-10-CM | POA: Diagnosis not present

## 2023-11-04 DIAGNOSIS — E1129 Type 2 diabetes mellitus with other diabetic kidney complication: Secondary | ICD-10-CM | POA: Diagnosis not present

## 2023-11-04 DIAGNOSIS — Z79899 Other long term (current) drug therapy: Secondary | ICD-10-CM | POA: Diagnosis not present

## 2023-11-04 DIAGNOSIS — I10 Essential (primary) hypertension: Secondary | ICD-10-CM | POA: Diagnosis not present

## 2023-11-05 DIAGNOSIS — E1129 Type 2 diabetes mellitus with other diabetic kidney complication: Secondary | ICD-10-CM | POA: Diagnosis not present

## 2023-11-05 DIAGNOSIS — F332 Major depressive disorder, recurrent severe without psychotic features: Secondary | ICD-10-CM | POA: Diagnosis not present

## 2023-11-05 DIAGNOSIS — N1832 Chronic kidney disease, stage 3b: Secondary | ICD-10-CM | POA: Diagnosis not present

## 2023-11-18 DIAGNOSIS — H524 Presbyopia: Secondary | ICD-10-CM | POA: Diagnosis not present

## 2023-11-25 ENCOUNTER — Ambulatory Visit
Admission: RE | Admit: 2023-11-25 | Discharge: 2023-11-25 | Disposition: A | Source: Ambulatory Visit | Attending: Internal Medicine | Admitting: Internal Medicine

## 2023-11-25 DIAGNOSIS — Z1231 Encounter for screening mammogram for malignant neoplasm of breast: Secondary | ICD-10-CM | POA: Diagnosis not present

## 2023-11-25 HISTORY — DX: Personal history of irradiation: Z92.3

## 2023-12-12 ENCOUNTER — Inpatient Hospital Stay: Payer: Medicare HMO | Admitting: Hematology and Oncology

## 2023-12-12 NOTE — Assessment & Plan Note (Deleted)
 Left lumpectomy 02/27/2017: IDC grade 1, 1.1 cm, ALH, margins negative, 0/2 lymph nodes negative, ER 90%, PR 10%, HER-2 negative ratio 1.26, Ki-67 1%, T1CN0 stage I a   Adjuvant radiation therapy 03/27/2017-04/30/2017   Treatment plan: Adjuvant antiestrogen therapy with letrozole  2.5 mg daily x5 years started 04/30/2017   Letrozole  toxicities: Mild hot flashes I recommended stopping letrozole  therapy since she completed 5 years.  She is close to taking it for 6 years at this point.   Husband died 05-12-19. Shes grieving from his loss Major depression: We will refer her to psychiatry in [place]. Osteoporosis: On Prolia  injections. Fracture of the shoulder: Receiving physical therapy with limitation of range of motion   Breast cancer surveillance: 1.  Mammogram 11/26/2022 benign breast density category C 2.  Breast exam 12/12/2023: Benign  Bone density 09/19/2022: T-score -2.2: Osteopenia

## 2023-12-17 ENCOUNTER — Telehealth: Payer: Self-pay | Admitting: Hematology and Oncology

## 2023-12-17 NOTE — Telephone Encounter (Signed)
 left vm for pt t calll back and rescheduled canceled appt

## 2024-01-15 ENCOUNTER — Encounter: Payer: Self-pay | Admitting: *Deleted

## 2024-01-15 ENCOUNTER — Inpatient Hospital Stay: Attending: Hematology and Oncology | Admitting: Hematology and Oncology

## 2024-01-15 VITALS — BP 124/62 | HR 78 | Temp 98.1°F | Resp 16 | Wt 147.2 lb

## 2024-01-15 DIAGNOSIS — Z170 Estrogen receptor positive status [ER+]: Secondary | ICD-10-CM | POA: Diagnosis not present

## 2024-01-15 DIAGNOSIS — Z1732 Human epidermal growth factor receptor 2 negative status: Secondary | ICD-10-CM | POA: Insufficient documentation

## 2024-01-15 DIAGNOSIS — M810 Age-related osteoporosis without current pathological fracture: Secondary | ICD-10-CM | POA: Insufficient documentation

## 2024-01-15 DIAGNOSIS — C50212 Malignant neoplasm of upper-inner quadrant of left female breast: Secondary | ICD-10-CM | POA: Diagnosis not present

## 2024-01-15 DIAGNOSIS — Z1721 Progesterone receptor positive status: Secondary | ICD-10-CM | POA: Insufficient documentation

## 2024-01-15 DIAGNOSIS — Z923 Personal history of irradiation: Secondary | ICD-10-CM | POA: Insufficient documentation

## 2024-01-15 DIAGNOSIS — Z79811 Long term (current) use of aromatase inhibitors: Secondary | ICD-10-CM | POA: Insufficient documentation

## 2024-01-15 MED ORDER — LETROZOLE 2.5 MG PO TABS: 2.5000 mg | ORAL_TABLET | Freq: Every day | ORAL | 3 refills | Status: AC

## 2024-01-15 NOTE — Progress Notes (Signed)
 "  Patient Care Team: Sheryle Carwin, MD as PCP - General (Internal Medicine) Charls Pearla LABOR, MD (Inactive) as PCP - Cardiology (Cardiology) Odean Potts, MD as Consulting Physician (Hematology and Oncology) Izell Domino, MD as Attending Physician (Radiation Oncology) Belinda Cough, MD as Consulting Physician (General Surgery) Crawford Morna Pickle, NP as Nurse Practitioner (Hematology and Oncology)  DIAGNOSIS:  Encounter Diagnosis  Name Primary?   Malignant neoplasm of upper-inner quadrant of left breast in female, estrogen receptor positive (HCC) Yes    SUMMARY OF ONCOLOGIC HISTORY: Oncology History  Malignant neoplasm of upper-inner quadrant of left breast in female, estrogen receptor positive (HCC)  02/27/2017 Surgery   Left lumpectomy 02/27/2017: IDC grade 1, 1.1 cm, ALH, margins negative, 0/2 lymph nodes negative, ER 90%, PR 10%, HER-2 negative ratio 1.26, Ki-67 1%, T1CN0 stage I a    03/27/2017 - 04/30/2017 Radiation Therapy   Adjuvant radiation therapy   04/30/2017 -  Anti-estrogen oral therapy   Letrozole  2.5 mg daily x 7 years     CHIEF COMPLIANT: Follow-up on letrozole  therapy  HISTORY OF PRESENT ILLNESS: History of Present Illness Breanna Hill is an 84 year old female with 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  She is in year 6 of planned 7 years of adjuvant letrozole . She had a normal screening mammogram in November 2025 and continues annual surveillance. She denies breast pain or other breast symptoms.  She has aromatase inhibitor-associated bone loss, improved from osteoporosis to osteopenia on Prolia , managed by another provider. She had a bone density scan in 2024 and is planned for repeat in 2026.     ALLERGIES:  is allergic to penicillins.  MEDICATIONS:  Current Outpatient Medications  Medication  Sig Dispense Refill   acetaminophen  (TYLENOL ) 650 MG CR tablet Take 1,300 mg by mouth every 8 (eight) hours as needed for pain.     Apoaequorin (PREVAGEN) 10 MG CAPS Take 10 mg by mouth in the morning.     aspirin  EC 81 MG tablet Take 81 mg by mouth daily. Swallow whole.     atorvastatin  (LIPITOR) 20 MG tablet Take 20 mg by mouth in the morning.     Calcium  Carbonate-Vitamin D (CALCIUM  600+D PO) Take 1 tablet by mouth at bedtime.     Cholecalciferol (VITAMIN D-3) 125 MCG (5000 UT) TABS Take 5,000 Units by mouth in the morning.     diclofenac Sodium (VOLTAREN ARTHRITIS PAIN) 1 % GEL Apply 2 g topically in the morning. Knee     letrozole  (FEMARA ) 2.5 MG tablet Take 1 tablet (2.5 mg total) by mouth daily. 90 tablet 3   levothyroxine  (SYNTHROID ) 88 MCG tablet Take 88 mcg by mouth daily before breakfast.     mirtazapine  (REMERON ) 15 MG tablet Take 15 mg by mouth at bedtime.     olmesartan (BENICAR) 20 MG tablet Take 20 mg by mouth daily.     sennosides-docusate sodium  (SENOKOT-S) 8.6-50 MG tablet Take 1 tablet by mouth daily.     trospium  (SANCTURA ) 20 MG tablet TAKE 1 TABLET BY MOUTH TWICE A DAY 180 tablet 3   Vibegron  (GEMTESA ) 75 MG TABS Take 1 tablet (75 mg total) by mouth daily. TAKE 1 TABLET BY MOUTH AT 2PM 90 tablet 3   zolpidem  (AMBIEN ) 5 MG tablet Take 2.5 tablets (12.5 mg total) by mouth at bedtime as needed for sleep. (Patient taking differently: Take 10 mg by mouth  at bedtime.) 30 tablet 0   No current facility-administered medications for this visit.    PHYSICAL EXAMINATION: ECOG PERFORMANCE STATUS: 1 - Symptomatic but completely ambulatory  Vitals:   01/15/24 1126  BP: 124/62  Pulse: 78  Resp: 16  Temp: 98.1 F (36.7 C)  SpO2: 94%   Filed Weights   01/15/24 1126  Weight: 147 lb 3.2 oz (66.8 kg)    Physical Exam Breast exam: Benign  (exam performed in the presence of a chaperone)  LABORATORY DATA:  I have reviewed the data as listed    Latest Ref Rng & Units  06/11/2022    8:25 AM 04/03/2022    3:43 AM 03/20/2022   11:13 AM  CMP  Glucose 70 - 99 mg/dL 888  846  885   BUN 8 - 23 mg/dL 25  16  27    Creatinine 0.44 - 1.00 mg/dL 8.81  8.88  8.63   Sodium 135 - 145 mmol/L 136  134  136   Potassium 3.5 - 5.1 mmol/L 4.0  3.7  4.0   Chloride 98 - 111 mmol/L 103  106  106   CO2 22 - 32 mmol/L 23  20  25    Calcium  8.9 - 10.3 mg/dL 9.4  8.0  9.2   Total Protein 6.5 - 8.1 g/dL 7.6   7.0   Total Bilirubin 0.3 - 1.2 mg/dL 0.7   0.6   Alkaline Phos 38 - 126 U/L 46   47   AST 15 - 41 U/L 23   23   ALT 0 - 44 U/L 22   23     Lab Results  Component Value Date   WBC 5.3 06/11/2022   HGB 12.9 06/11/2022   HCT 38.6 06/11/2022   MCV 92.1 06/11/2022   PLT 297 06/11/2022   NEUTROABS 3.3 06/11/2022    ASSESSMENT & PLAN:  Malignant neoplasm of upper-inner quadrant of left breast in female, estrogen receptor positive (HCC) Left lumpectomy 02/27/2017: IDC grade 1, 1.1 cm, ALH, margins negative, 0/2 lymph nodes negative, ER 90%, PR 10%, HER-2 negative ratio 1.26, Ki-67 1%, T1CN0 stage I a   Adjuvant radiation therapy 03/27/2017-04/30/2017   Treatment plan: Adjuvant antiestrogen therapy with letrozole  2.5 mg daily x 7 years started 04/30/2017 to be completed February 03, 2025   Husband died 05-05-2019. Osteoporosis: On Prolia  injections. Fracture of the shoulder: Received physical therapy with limitation of range of motion   Breast cancer surveillance: 1.  Mammogram 11/25/2023 benign breast density category C 2.  Bone density 09/19/2022: T-score -2.2: Osteopenia 3.  Breast exam 01/15/2024: Benign  Return to clinic in 1 year for follow-up   No orders of the defined types were placed in this encounter.  The patient has a good understanding of the overall plan. she agrees with it. she will call with any problems that may develop before the next visit here.  I personally spent a total of 30 minutes in the care of the patient today including preparing to see the patient,  getting/reviewing separately obtained history, performing a medically appropriate exam/evaluation, counseling and educating, placing orders, referring and communicating with other health care professionals, documenting clinical information in the EHR, independently interpreting results, communicating results, and coordinating care.   Viinay K Nidal Rivet, MD 01/15/2024    "

## 2024-01-15 NOTE — Progress Notes (Signed)
 Breanna Hill                                          MRN: [id number]   01/15/2024   The [facility] Quality Team Specialist reviewed this patient medical record for the purposes of chart review for care gap closure. The following were reviewed: chart review for care gap closure-controlling blood pressure.    [facility] Quality Team

## 2024-01-15 NOTE — Assessment & Plan Note (Signed)
 Left lumpectomy 02/27/2017: IDC grade 1, 1.1 cm, ALH, margins negative, 0/2 lymph nodes negative, ER 90%, PR 10%, HER-2 negative ratio 1.26, Ki-67 1%, T1CN0 stage I a   Adjuvant radiation therapy 03/27/2017-04/30/2017   Treatment plan: Adjuvant antiestrogen therapy with letrozole  2.5 mg daily x5 years started 04/30/2017 completed 02/11/23   Husband died May 12, 2019. Osteoporosis: On Prolia  injections. Fracture of the shoulder: Received physical therapy with limitation of range of motion   Breast cancer surveillance: 1.  Mammogram 11/25/2023 benign breast density category C 2.  Bone density 09/19/2022: T-score -2.2: Osteopenia 3.  Breast exam 01/15/2024: Benign  Return to clinic on an as-needed basis

## 2024-04-09 ENCOUNTER — Ambulatory Visit

## 2025-01-14 ENCOUNTER — Inpatient Hospital Stay: Admitting: Hematology and Oncology
